# Patient Record
Sex: Female | Born: 1960 | Race: White | Hispanic: No | Marital: Married | Smoking: Former smoker
Health system: Southern US, Community
[De-identification: ages and names within clinical notes are randomized; demographics above are authoritative.]

## PROBLEM LIST (undated history)

## (undated) DIAGNOSIS — I4891 Unspecified atrial fibrillation: Secondary | ICD-10-CM

## (undated) DIAGNOSIS — Z86718 Personal history of other venous thrombosis and embolism: Secondary | ICD-10-CM

## (undated) DIAGNOSIS — I7779 Dissection of other artery: Secondary | ICD-10-CM

## (undated) DIAGNOSIS — I10 Essential (primary) hypertension: Secondary | ICD-10-CM

## (undated) DIAGNOSIS — E785 Hyperlipidemia, unspecified: Secondary | ICD-10-CM

## (undated) DIAGNOSIS — E119 Type 2 diabetes mellitus without complications: Secondary | ICD-10-CM

## (undated) HISTORY — PX: KNEE ARTHROSCOPY: SUR90

## (undated) HISTORY — DX: Essential (primary) hypertension: I10

## (undated) HISTORY — DX: Personal history of other venous thrombosis and embolism: Z86.718

## (undated) HISTORY — PX: ABDOMINAL HYSTERECTOMY: SHX81

## (undated) HISTORY — DX: Hyperlipidemia, unspecified: E78.5

## (undated) HISTORY — PX: FRACTURE SURGERY: SHX138

---

## 2008-04-09 DIAGNOSIS — Z86718 Personal history of other venous thrombosis and embolism: Secondary | ICD-10-CM

## 2008-04-09 HISTORY — DX: Personal history of other venous thrombosis and embolism: Z86.718

## 2009-01-01 ENCOUNTER — Emergency Department (HOSPITAL_COMMUNITY): Admission: EM | Admit: 2009-01-01 | Discharge: 2009-01-02 | Payer: Self-pay | Admitting: Emergency Medicine

## 2009-01-01 ENCOUNTER — Ambulatory Visit: Payer: Self-pay | Admitting: Vascular Surgery

## 2009-01-04 ENCOUNTER — Inpatient Hospital Stay (HOSPITAL_COMMUNITY): Admission: EM | Admit: 2009-01-04 | Discharge: 2009-01-11 | Payer: Self-pay | Admitting: Emergency Medicine

## 2009-02-10 ENCOUNTER — Ambulatory Visit: Payer: Self-pay | Admitting: Vascular Surgery

## 2009-05-05 ENCOUNTER — Ambulatory Visit: Payer: Self-pay | Admitting: Vascular Surgery

## 2009-05-05 ENCOUNTER — Encounter: Admission: RE | Admit: 2009-05-05 | Discharge: 2009-05-05 | Payer: Self-pay | Admitting: Vascular Surgery

## 2010-02-24 ENCOUNTER — Encounter: Admission: RE | Admit: 2010-02-24 | Payer: Self-pay | Admitting: Vascular Surgery

## 2010-04-26 ENCOUNTER — Ambulatory Visit: Admit: 2010-04-26 | Payer: Self-pay | Admitting: Vascular Surgery

## 2010-04-29 ENCOUNTER — Encounter: Payer: Self-pay | Admitting: Vascular Surgery

## 2010-04-30 ENCOUNTER — Encounter: Payer: Self-pay | Admitting: Vascular Surgery

## 2010-07-13 LAB — CBC
HCT: 36.6 % (ref 36.0–46.0)
Hemoglobin: 11.9 g/dL — ABNORMAL LOW (ref 12.0–15.0)
Hemoglobin: 12.7 g/dL (ref 12.0–15.0)
Hemoglobin: 13.2 g/dL (ref 12.0–15.0)
Hemoglobin: 13.8 g/dL (ref 12.0–15.0)
RBC: 4.54 MIL/uL (ref 3.87–5.11)
RBC: 4.72 MIL/uL (ref 3.87–5.11)
RBC: 4.89 MIL/uL (ref 3.87–5.11)
RDW: 13.5 % (ref 11.5–15.5)
RDW: 13.8 % (ref 11.5–15.5)
WBC: 6.3 10*3/uL (ref 4.0–10.5)
WBC: 9.8 10*3/uL (ref 4.0–10.5)

## 2010-07-13 LAB — HEPARIN LEVEL (UNFRACTIONATED): Heparin Unfractionated: <0.1 IU/mL — ABNORMAL LOW (ref 0.30–0.70)

## 2010-07-13 LAB — PROTIME-INR
INR: 1 (ref 0.00–1.49)
INR: 1 (ref 0.00–1.49)
INR: 1.2 (ref 0.00–1.49)
INR: 1.4 (ref 0.00–1.49)
Prothrombin Time: 12.9 seconds (ref 11.6–15.2)
Prothrombin Time: 14.9 seconds (ref 11.6–15.2)
Prothrombin Time: 17.3 seconds — ABNORMAL HIGH (ref 11.6–15.2)

## 2010-07-14 LAB — DIFFERENTIAL
Basophils Absolute: 0 10*3/uL (ref 0.0–0.1)
Basophils Relative: 0 % (ref 0–1)
Eosinophils Relative: 2 % (ref 0–5)
Eosinophils Relative: 3 % (ref 0–5)
Lymphocytes Relative: 22 % (ref 12–46)
Lymphocytes Relative: 34 % (ref 12–46)
Lymphs Abs: 2.7 10*3/uL (ref 0.7–4.0)
Monocytes Absolute: 0.4 10*3/uL (ref 0.1–1.0)
Monocytes Absolute: 0.4 10*3/uL (ref 0.1–1.0)
Monocytes Relative: 4 % (ref 3–12)

## 2010-07-14 LAB — CBC
MCHC: 32.5 g/dL (ref 30.0–36.0)
MCHC: 32.6 g/dL (ref 30.0–36.0)
MCHC: 33 g/dL (ref 30.0–36.0)
MCV: 84.6 fL (ref 78.0–100.0)
MCV: 85.6 fL (ref 78.0–100.0)
Platelets: 209 10*3/uL (ref 150–400)
Platelets: 241 10*3/uL (ref 150–400)
Platelets: 265 10*3/uL (ref 150–400)
Platelets: 268 10*3/uL (ref 150–400)
Platelets: 281 10*3/uL (ref 150–400)
RBC: 4.21 MIL/uL (ref 3.87–5.11)
RDW: 13.7 % (ref 11.5–15.5)
RDW: 13.7 % (ref 11.5–15.5)
WBC: 6.9 10*3/uL (ref 4.0–10.5)
WBC: 7 10*3/uL (ref 4.0–10.5)
WBC: 8 10*3/uL (ref 4.0–10.5)
WBC: 8.7 10*3/uL (ref 4.0–10.5)

## 2010-07-14 LAB — COMPREHENSIVE METABOLIC PANEL
AST: 24 U/L (ref 0–37)
AST: 33 U/L (ref 0–37)
Albumin: 3.7 g/dL (ref 3.5–5.2)
Albumin: 3.9 g/dL (ref 3.5–5.2)
Alkaline Phosphatase: 66 U/L (ref 39–117)
Calcium: 9 mg/dL (ref 8.4–10.5)
Chloride: 105 mEq/L (ref 96–112)
Chloride: 108 mEq/L (ref 96–112)
Creatinine, Ser: 0.83 mg/dL (ref 0.4–1.2)
GFR calc Af Amer: >60 mL/min (ref >60)
GFR calc Af Amer: >60 mL/min (ref >60)
Potassium: 3.2 mEq/L — ABNORMAL LOW (ref 3.5–5.1)
Total Bilirubin: 0.9 mg/dL (ref 0.3–1.2)
Total Protein: 7.3 g/dL (ref 6.0–8.3)

## 2010-07-14 LAB — URINALYSIS, ROUTINE W REFLEX MICROSCOPIC
Bilirubin Urine: NEGATIVE
Glucose, UA: NEGATIVE mg/dL
Hgb urine dipstick: NEGATIVE
Hgb urine dipstick: NEGATIVE
Specific Gravity, Urine: 1.014 (ref 1.005–1.030)
Specific Gravity, Urine: 1.021 (ref 1.005–1.030)
Urobilinogen, UA: 0.2 mg/dL (ref 0.0–1.0)
pH: 7.5 (ref 5.0–8.0)

## 2010-07-14 LAB — BASIC METABOLIC PANEL
BUN: 6 mg/dL (ref 6–23)
CO2: 31 mEq/L (ref 19–32)
Calcium: 8.3 mg/dL — ABNORMAL LOW (ref 8.4–10.5)
Chloride: 104 mEq/L (ref 96–112)
Creatinine, Ser: 0.76 mg/dL (ref 0.4–1.2)
Creatinine, Ser: 0.82 mg/dL (ref 0.4–1.2)
GFR calc Af Amer: >60 mL/min (ref >60)
GFR calc non Af Amer: >60 mL/min (ref >60)
Glucose, Bld: 123 mg/dL — ABNORMAL HIGH (ref 70–99)
Sodium: 135 mEq/L (ref 135–145)

## 2010-07-14 LAB — PROTIME-INR: Prothrombin Time: 13.2 seconds (ref 11.6–15.2)

## 2010-07-14 LAB — POCT I-STAT, CHEM 8
Calcium, Ion: 1.17 mmol/L (ref 1.12–1.32)
Chloride: 106 mEq/L (ref 96–112)
Creatinine, Ser: 0.6 mg/dL (ref 0.4–1.2)
Glucose, Bld: 120 mg/dL — ABNORMAL HIGH (ref 70–99)
HCT: 46 % (ref 36.0–46.0)

## 2010-07-14 LAB — HEPARIN LEVEL (UNFRACTIONATED)
Heparin Unfractionated: 0.64 IU/mL (ref 0.30–0.70)
Heparin Unfractionated: 0.68 IU/mL (ref 0.30–0.70)
Heparin Unfractionated: 0.85 IU/mL — ABNORMAL HIGH (ref 0.30–0.70)

## 2010-07-14 LAB — URINE MICROSCOPIC-ADD ON

## 2010-07-14 LAB — URINE CULTURE

## 2010-08-22 NOTE — Assessment & Plan Note (Signed)
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  This is a pleasant 50 year old woman who presented in  September of 2010 with a 3 week history of epigastric pain.  CT scan  suggested inflammation around the celiac axis and possibly some thrombus  within the proximal celiac axis.  The patient had injured her epigastric  area on a shopping cart at [facility] prior to this.  Because of her morbid  obesity we took a conservative approach and she had a followup CT scan  which showed that in fact there really was no thrombus in the proximal  celiac axis but there was a dissection and this was treated  conservatively.  She was placed on Coumadin with tight blood pressure  control.  She is followed by Dr. Alwyn [name].   Since I saw her last in November she has had no abdominal pain or back  pain.  She has had no postprandial abdominal pain.  She has had no  weight loss and in fact she has gained 5-6 pounds.  She has been  essentially asymptomatic since her last visit.   PAST MEDICAL HISTORY:  Significant for obesity which has been stable  with only some slight weight gain since I saw her in November.   SOCIAL HISTORY:  She is married.  She is not a smoker.   REVIEW OF SYSTEMS:  CARDIOVASCULAR:  She has had no recent chest pain,  chest pressure, palpitations or arrhythmias.  She has had no history of  claudication, rest pain or nonhealing ulcers.  She has had no history of  stroke, TIAs.  She has had no history of DVT or phlebitis.  PULMONARY:  She has had no productive cough, bronchitis, asthma or  wheezing.   PHYSICAL EXAMINATION:  General:  This is a pleasant 50 year old woman  who appears her stated age.  She is obese.  Vital signs:  Blood pressure  is 146/91, temperature is 98.2, heart rate is 84.  Lungs:   Clear  bilaterally to auscultation.  Cardiovascular:  I do not detect any  carotid bruits.  She has a regular rate and rhythm without murmur  appreciated.  She has palpable femoral and posterior tibial pulses  bilaterally with warm and well-perfused feet.  She has no significant  lower extremity swelling.  Abdomen:  Soft and nontender.  I cannot  appreciate any bruits.  She has normal pitched bowel sounds.  Neurological:  She has no focal weakness or paresthesias.   I did review her CT scan of the abdomen which was performed today.  It  is difficult to visualize the proximal celiac axis but beyond that the  artery is clearly patent with no evidence of aneurysm or complication  related to her previous dissection.  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  She has  been on Coumadin since September and I think at this point it is safe to  discontinue her Coumadin.  I think it would be a good idea to  keep her  on aspirin daily.  At this point I think it is safe to do a followup CT  scan in 1 year.  She knows to call sooner if she has problems.  I have  ordered the followup CT scan and I will see her back at that time.     Di Kindle. Edilia Bo, M.D.  Electronically Signed   CSD/MEDQ  D:  05/05/2009  T:  05/06/2009  Job:  [id number]   cc:   Tracie [name], M.D.

## 2010-08-22 NOTE — Assessment & Plan Note (Signed)
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  This is a pleasant 50 year old woman who had  presented initially September 25 with a 3 week history of epigastric  pain.  CT at that time suggested some inflammation around the celiac  artery and some thrombus within the proximal celiac axis.  Of note this  patient was hit with a shopping cart in the epigastric area at [facility]  prior to this.  She had morbid obesity and we took a conservative  approach.  She was set up for a followup CT scan which actually showed  that there was really no thrombus in the celiac artery but this was a  dissection.  We have elected to treat this conservatively with Coumadin  and blood pressure control.  She has been on Coumadin and is followed by  Dr. Alwyn Pea.   Since she was in the hospital she has had no further abdominal pain.  Her appetite has been good.  She has had no postprandial pain.  Her  bowels have been functioning normally.   REVIEW OF SYSTEMS:  She has had no fever or chills.  She has had no  chest pain or chest pressure.   PHYSICAL EXAMINATION:  General:  This is a pleasant 50 year old woman  who appears her stated age.  She is 297 pounds.  Vital signs:  Her blood  pressure is 176/100, heart rate is 80, respiratory rate 16.  Abdomen:  Soft and nontender.  She has normal pitched bowel sounds.  Lungs:  Are  clear bilaterally to auscultation.   Overall her celiac dissection appears to be asymptomatic at this point  and we will continue her Coumadin for 3 months before repeating her CT  scan.  We will repeat a CT scan in 3 months and if it looks good at that  point I think it would be safe to stop her Coumadin.  In the meantime  she will continue to follow her Coumadin closely in Dr. Ermalene Searing  office.  I plan on seeing her back in early January after a CT scan.  She knows  to call sooner if she has problems.  We have also stressed the  importance of good blood pressure control with her.   Di Kindle. Edilia Bo, M.D.  Electronically Signed   CSD/MEDQ  D:  02/10/2009  T:  02/11/2009  Job:  [id number]

## 2012-06-06 ENCOUNTER — Other Ambulatory Visit: Payer: Self-pay | Admitting: Family Medicine

## 2012-06-06 DIAGNOSIS — Z1231 Encounter for screening mammogram for malignant neoplasm of breast: Secondary | ICD-10-CM

## 2012-06-30 ENCOUNTER — Ambulatory Visit
Admission: RE | Admit: 2012-06-30 | Discharge: 2012-06-30 | Disposition: A | Discharge status: Home / Self Care | Payer: 59 | Source: Ambulatory Visit | Attending: Family Medicine | Admitting: Family Medicine

## 2012-06-30 DIAGNOSIS — Z1231 Encounter for screening mammogram for malignant neoplasm of breast: Secondary | ICD-10-CM

## 2014-02-18 ENCOUNTER — Encounter (HOSPITAL_COMMUNITY): Payer: Self-pay | Admitting: *Deleted

## 2014-02-18 NOTE — Progress Notes (Signed)
Prior Auth Pristiq 100mg  Approved Effective dates of authorization: 02-18-14 to 04-08-2038 Phone number [phone]

## 2018-06-03 DIAGNOSIS — E785 Hyperlipidemia, unspecified: Secondary | ICD-10-CM | POA: Insufficient documentation

## 2018-06-03 DIAGNOSIS — I10 Essential (primary) hypertension: Secondary | ICD-10-CM | POA: Insufficient documentation

## 2018-07-09 ENCOUNTER — Other Ambulatory Visit: Payer: Self-pay

## 2018-07-09 DIAGNOSIS — Z1389 Encounter for screening for other disorder: Secondary | ICD-10-CM

## 2018-07-09 DIAGNOSIS — Z13220 Encounter for screening for lipoid disorders: Secondary | ICD-10-CM

## 2018-07-09 DIAGNOSIS — Z13228 Encounter for screening for other metabolic disorders: Secondary | ICD-10-CM

## 2018-07-09 DIAGNOSIS — Z1329 Encounter for screening for other suspected endocrine disorder: Secondary | ICD-10-CM

## 2018-07-09 DIAGNOSIS — Z130 Encounter for screening for diseases of the blood and blood-forming organs and certain disorders involving the immune mechanism: Secondary | ICD-10-CM

## 2018-07-15 ENCOUNTER — Ambulatory Visit (INDEPENDENT_AMBULATORY_CARE_PROVIDER_SITE_OTHER): Payer: Self-pay | Admitting: Family Medicine

## 2018-07-15 DIAGNOSIS — Z13220 Encounter for screening for lipoid disorders: Secondary | ICD-10-CM

## 2018-07-15 DIAGNOSIS — Z1329 Encounter for screening for other suspected endocrine disorder: Secondary | ICD-10-CM

## 2018-07-15 DIAGNOSIS — Z13228 Encounter for screening for other metabolic disorders: Secondary | ICD-10-CM

## 2018-07-15 DIAGNOSIS — Z1389 Encounter for screening for other disorder: Secondary | ICD-10-CM

## 2018-07-15 DIAGNOSIS — Z130 Encounter for screening for diseases of the blood and blood-forming organs and certain disorders involving the immune mechanism: Secondary | ICD-10-CM

## 2018-07-16 ENCOUNTER — Telehealth: Payer: Self-pay | Admitting: Family Medicine

## 2018-07-16 ENCOUNTER — Other Ambulatory Visit: Payer: Self-pay

## 2018-07-16 ENCOUNTER — Encounter: Payer: Self-pay | Admitting: Family Medicine

## 2018-07-16 ENCOUNTER — Ambulatory Visit (INDEPENDENT_AMBULATORY_CARE_PROVIDER_SITE_OTHER): Payer: 59 | Admitting: Family Medicine

## 2018-07-16 DIAGNOSIS — I10 Essential (primary) hypertension: Secondary | ICD-10-CM

## 2018-07-16 DIAGNOSIS — Z13220 Encounter for screening for lipoid disorders: Secondary | ICD-10-CM

## 2018-07-16 DIAGNOSIS — I83813 Varicose veins of bilateral lower extremities with pain: Secondary | ICD-10-CM

## 2018-07-16 DIAGNOSIS — Z1389 Encounter for screening for other disorder: Secondary | ICD-10-CM

## 2018-07-16 DIAGNOSIS — Z1329 Encounter for screening for other suspected endocrine disorder: Secondary | ICD-10-CM

## 2018-07-16 DIAGNOSIS — Z13228 Encounter for screening for other metabolic disorders: Secondary | ICD-10-CM

## 2018-07-16 DIAGNOSIS — Z130 Encounter for screening for diseases of the blood and blood-forming organs and certain disorders involving the immune mechanism: Secondary | ICD-10-CM

## 2018-07-16 LAB — CMP14+EGFR
ALT: 13 IU/L (ref 0–32)
AST: 14 IU/L (ref 0–40)
Albumin/Globulin Ratio: 1.3 (ref 1.2–2.2)
Albumin: 4 g/dL (ref 3.8–4.9)
Alkaline Phosphatase: 82 IU/L (ref 39–117)
BUN/Creatinine Ratio: 21 (ref 9–23)
BUN: 18 mg/dL (ref 6–24)
Bilirubin Total: 0.3 mg/dL (ref 0.0–1.2)
CO2: 23 mmol/L (ref 20–29)
Calcium: 9.5 mg/dL (ref 8.7–10.2)
Chloride: 103 mmol/L (ref 96–106)
Creatinine, Ser: 0.85 mg/dL (ref 0.57–1.00)
GFR calc Af Amer: 87 mL/min/{1.73_m2} (ref >59)
GFR calc non Af Amer: 75 mL/min/{1.73_m2} (ref >59)
Globulin, Total: 3 g/dL (ref 1.5–4.5)
Glucose: 146 mg/dL — ABNORMAL HIGH (ref 65–99)
Potassium: 4.2 mmol/L (ref 3.5–5.2)
Sodium: 142 mmol/L (ref 134–144)
Total Protein: 7 g/dL (ref 6.0–8.5)

## 2018-07-16 LAB — CBC WITH DIFFERENTIAL/PLATELET
Basophils Absolute: 0 10*3/uL (ref 0.0–0.2)
Basos: 0 %
EOS (ABSOLUTE): 0.5 10*3/uL — ABNORMAL HIGH (ref 0.0–0.4)
Eos: 4 %
Hematocrit: 43.1 % (ref 34.0–46.6)
Hemoglobin: 14.5 g/dL (ref 11.1–15.9)
Immature Grans (Abs): 0 10*3/uL (ref 0.0–0.1)
Immature Granulocytes: 0 %
Lymphocytes Absolute: 3.8 10*3/uL — ABNORMAL HIGH (ref 0.7–3.1)
Lymphs: 32 %
MCH: 29.1 pg (ref 26.6–33.0)
MCHC: 33.6 g/dL (ref 31.5–35.7)
MCV: 86 fL (ref 79–97)
Monocytes Absolute: 0.8 10*3/uL (ref 0.1–0.9)
Monocytes: 6 %
Neutrophils Absolute: 7 10*3/uL (ref 1.4–7.0)
Neutrophils: 58 %
Platelets: 295 10*3/uL (ref 150–450)
RBC: 4.99 x10E6/uL (ref 3.77–5.28)
RDW: 12.5 % (ref 11.7–15.4)
WBC: 12.1 10*3/uL — ABNORMAL HIGH (ref 3.4–10.8)

## 2018-07-16 LAB — URINALYSIS, DIPSTICK ONLY
Bilirubin, UA: NEGATIVE
Glucose, UA: NEGATIVE
Ketones, UA: NEGATIVE
Nitrite, UA: NEGATIVE
Protein,UA: NEGATIVE
RBC, UA: NEGATIVE
Specific Gravity, UA: 1.024 (ref 1.005–1.030)
Urobilinogen, Ur: 0.2 mg/dL (ref 0.2–1.0)
pH, UA: 5 (ref 5.0–7.5)

## 2018-07-16 MED ORDER — METOPROLOL SUCCINATE ER 50 MG PO TB24: 50.0000 mg | ORAL_TABLET | Freq: Every day | ORAL | 1 refills | Status: DC

## 2018-07-16 MED ORDER — LOSARTAN POTASSIUM 100 MG PO TABS: 100.0000 mg | ORAL_TABLET | Freq: Every day | ORAL | 1 refills | Status: DC

## 2018-07-16 MED ORDER — AMLODIPINE BESYLATE 10 MG PO TABS: 10.0000 mg | ORAL_TABLET | Freq: Every day | ORAL | 1 refills | Status: DC

## 2018-07-16 NOTE — Progress Notes (Signed)
Virtual Visit via telephone Note  I connected with patient on 07/16/18 at 220pm by telephone and verified that I am speaking with the correct person using two identifiers. Tracie [name] is currently located at home and patient is currently with her during visit. The provider, Myles Lipps, MD is located in their office at time of visit.  I discussed the limitations, risks, security and privacy concerns of performing an evaluation and management service by telephone and the availability of in person appointments. I also discussed with the patient that there may be a patient responsible charge related to this service. The patient expressed understanding and agreed to proceed.  No chief complaint on file.   Telephone visit today to establish care  HPI Previous PCP Dr Haskel Schroeder Patient reports she is overall doing well Checks BP occ at the pharmacy, 120/70 occ feels a bit off balance when she tries to stand to quick, no falls No chest pain, SOB, palpitations, no cough Reports sign swelling of legs, known varicose veins, better with elevation, does not use compression stockings, swelling prior to use of amlodipine?  No flowsheet data found.   No flowsheet data found.  Allergies  Allergen Reactions  . Codeine     Prior to Admission medications   Medication Sig Start Date End Date Taking? Authorizing Provider  amLODipine (NORVASC) 10 MG tablet TK 1 T PO QD 06/12/18  Yes [provider]  losartan (COZAAR) 100 MG tablet TK 1 T PO QD 06/12/18   [provider]  metoprolol succinate (TOPROL-XL) 50 MG 24 hr tablet TK 1 T PO QD 06/12/18   [provider]    Past Medical History:  Diagnosis Date  . Hyperlipidemia   . Hypertension     History reviewed. No pertinent surgical history.  Social History   Tobacco Use  . Smoking status: Former Smoker    Packs/day: 1.00    Years: 20.00    Pack years: 20.00    Types: Cigarettes    Last attempt to quit: 2010     Years since quitting: 10.2  . Smokeless tobacco: Never Used  Substance Use Topics  . Alcohol use: Never    Frequency: Never    History reviewed. No pertinent family history.  ROS Per hpi   Objective  Vitals as reported by the patient: as above  There were no vitals filed for this visit.  ASSESSMENT and PLAN  1. Essential hypertension, benign Per reported Bps seems to be at goal. meds refilled  2. Varicose veins of both lower extremities with pain Discussed use of compression stockings  Other orders - amLODipine (NORVASC) 10 MG tablet; Take 1 tablet (10 mg total) by mouth daily. - losartan (COZAAR) 100 MG tablet; Take 1 tablet (100 mg total) by mouth daily. - metoprolol succinate (TOPROL-XL) 50 MG 24 hr tablet; Take 1 tablet (50 mg total) by mouth daily. Take with or immediately following a meal.  FOLLOW-UP: 3 months   The above assessment and management plan was discussed with the patient. The patient verbalized understanding of and has agreed to the management plan. Patient is aware to call the clinic if symptoms persist or worsen. Patient is aware when to return to the clinic for a follow-up visit. Patient educated on when it is appropriate to go to the emergency department.    I provided 15 minutes of non-face-to-face time during this encounter.  Myles Lipps, MD Primary Care at [facility] [address] [place], [place]  [address] Ph.  [phone] Fax [phone]

## 2018-07-16 NOTE — Telephone Encounter (Signed)
Left VM to schedule appt for 3 month f/u per [place]

## 2018-07-17 LAB — LIPID PANEL
Chol/HDL Ratio: 3.2 ratio (ref 0.0–4.4)
Cholesterol, Total: 161 mg/dL (ref 100–199)
HDL: 51 mg/dL (ref >39)
LDL Calculated: 85 mg/dL (ref 0–99)
Triglycerides: 125 mg/dL (ref 0–149)
VLDL Cholesterol Cal: 25 mg/dL (ref 5–40)

## 2018-07-17 LAB — TSH: TSH: 3.67 u[IU]/mL (ref 0.450–4.500)

## 2018-10-20 ENCOUNTER — Ambulatory Visit (INDEPENDENT_AMBULATORY_CARE_PROVIDER_SITE_OTHER): Payer: 59 | Admitting: Family Medicine

## 2018-10-20 ENCOUNTER — Encounter: Payer: Self-pay | Admitting: Family Medicine

## 2018-10-20 ENCOUNTER — Other Ambulatory Visit: Payer: Self-pay

## 2018-10-20 VITALS — BP 126/84 | HR 100 | Temp 97.7°F | Ht 70.5 in | Wt 317.0 lb

## 2018-10-20 DIAGNOSIS — R29898 Other symptoms and signs involving the musculoskeletal system: Secondary | ICD-10-CM

## 2018-10-20 DIAGNOSIS — M791 Myalgia, unspecified site: Secondary | ICD-10-CM

## 2018-10-20 DIAGNOSIS — D72829 Elevated white blood cell count, unspecified: Secondary | ICD-10-CM

## 2018-10-20 DIAGNOSIS — Z1231 Encounter for screening mammogram for malignant neoplasm of breast: Secondary | ICD-10-CM

## 2018-10-20 DIAGNOSIS — M2550 Pain in unspecified joint: Secondary | ICD-10-CM

## 2018-10-20 DIAGNOSIS — I10 Essential (primary) hypertension: Secondary | ICD-10-CM

## 2018-10-20 DIAGNOSIS — R768 Other specified abnormal immunological findings in serum: Secondary | ICD-10-CM

## 2018-10-20 DIAGNOSIS — R700 Elevated erythrocyte sedimentation rate: Secondary | ICD-10-CM

## 2018-10-20 DIAGNOSIS — R7301 Impaired fasting glucose: Secondary | ICD-10-CM

## 2018-10-20 DIAGNOSIS — Z1211 Encounter for screening for malignant neoplasm of colon: Secondary | ICD-10-CM

## 2018-10-20 LAB — CBC WITH DIFFERENTIAL/PLATELET
Basophils Absolute: 0.1 10*3/uL (ref 0.0–0.2)
Basos: 1 %
EOS (ABSOLUTE): 0.4 10*3/uL (ref 0.0–0.4)
Eos: 4 %
Hematocrit: 46.2 % (ref 34.0–46.6)
Hemoglobin: 15.2 g/dL (ref 11.1–15.9)
Immature Grans (Abs): 0 10*3/uL (ref 0.0–0.1)
Immature Granulocytes: 0 %
Lymphocytes Absolute: 2.5 10*3/uL (ref 0.7–3.1)
Lymphs: 27 %
MCH: 28.2 pg (ref 26.6–33.0)
MCHC: 32.9 g/dL (ref 31.5–35.7)
MCV: 86 fL (ref 79–97)
Monocytes Absolute: 0.5 10*3/uL (ref 0.1–0.9)
Monocytes: 6 %
Neutrophils Absolute: 5.8 10*3/uL (ref 1.4–7.0)
Neutrophils: 62 %
Platelets: 301 10*3/uL (ref 150–450)
RBC: 5.39 x10E6/uL — ABNORMAL HIGH (ref 3.77–5.28)
RDW: 12 % (ref 11.7–15.4)
WBC: 9.2 10*3/uL (ref 3.4–10.8)

## 2018-10-20 LAB — HEMOGLOBIN A1C
Est. average glucose Bld gHb Est-mCnc: 148 mg/dL
Hgb A1c MFr Bld: 6.8 % — ABNORMAL HIGH (ref 4.8–5.6)

## 2018-10-20 MED ORDER — METOPROLOL SUCCINATE ER 50 MG PO TB24: 50.0000 mg | ORAL_TABLET | Freq: Every day | ORAL | 1 refills | Status: DC

## 2018-10-20 MED ORDER — CYCLOBENZAPRINE HCL 10 MG PO TABS: 10.0000 mg | ORAL_TABLET | Freq: Three times a day (TID) | ORAL | 1 refills | Status: DC | PRN

## 2018-10-20 MED ORDER — LOSARTAN POTASSIUM 100 MG PO TABS: 100.0000 mg | ORAL_TABLET | Freq: Every day | ORAL | 1 refills | Status: DC

## 2018-10-20 MED ORDER — AMLODIPINE BESYLATE 10 MG PO TABS: 10.0000 mg | ORAL_TABLET | Freq: Every day | ORAL | 1 refills | Status: DC

## 2018-10-20 NOTE — Progress Notes (Signed)
7/13/202010:49 AM  Mordecai MaesDebbie E Stormont [birth year], 58 y.o., female [id number]  Chief Complaint  Patient presents with  . Hypertension    has concerns about the medication she is taking due to the side effects. She is having trouble walking now due to the side effects    HPI:   Patient is a 58 y.o. female with past medical history significant for HTN and HLP who presents today for BP followup  She is concerned she might be having side effects  Has been on current BP medications since 2012 She reports current symptoms for years but have worsened this past year Feels leg are very weak, difficulty with standing due to feeling weak, muscles are painful, knees get stiff Reporting constant moving of legs at night Denies any numbness or tingling of legs but has lots of cramping when she sits on the toilet She reports chronic right sided low back pain Pain is worse when she wakes up No fhx inflammatory arthritis  Right knee arthroscopy surgery, has had multiple strained Right quad   Lab Results  Component Value Date   CHOL 161 07/16/2018   HDL 51 07/16/2018   LDLCALC 85 07/16/2018   TRIG 125 07/16/2018   CHOLHDL 3.2 07/16/2018   Lab Results  Component Value Date   CREATININE 0.85 07/16/2018   BUN 18 07/16/2018   NA 142 07/16/2018   K 4.2 07/16/2018   CL 103 07/16/2018   CO2 23 07/16/2018   Lab Results  Component Value Date   WBC 12.1 (H) 07/16/2018   HGB 14.5 07/16/2018   HCT 43.1 07/16/2018   MCV 86 07/16/2018   PLT 295 07/16/2018   Fasting glucose 145  [facility] 10/20/2018 10/20/2018  Decreased Interest 0 -  Down, Depressed, Hopeless 0 0  PHQ - 2 Score 0 0    Fall Risk  10/20/2018  Falls in the past year? 0  Number falls in past yr: 0  Injury with Fall? 0     Allergies  Allergen Reactions  . Codeine     Prior to Admission medications   Medication Sig Start Date End Date Taking? Authorizing Provider  amLODipine (NORVASC) 10 MG tablet Take 1  tablet (10 mg total) by mouth daily. 07/16/18  Yes Myles LippsSantiago, Shanequia Kendrick M, MD  losartan (COZAAR) 100 MG tablet Take 1 tablet (100 mg total) by mouth daily. 07/16/18  Yes Myles LippsSantiago, Nyaira Hodgens M, MD  metoprolol succinate (TOPROL-XL) 50 MG 24 hr tablet Take 1 tablet (50 mg total) by mouth daily. Take with or immediately following a meal. 07/16/18  Yes Myles LippsSantiago, Mitsuko Luera M, MD    Past Medical History:  Diagnosis Date  . History of DVT (deep vein thrombosis) 2010  . Hyperlipidemia   . Hypertension     Past Surgical History:  Procedure Laterality Date  . FRACTURE SURGERY Left    hand  . KNEE ARTHROSCOPY Right 1990s    Social History   Tobacco Use  . Smoking status: Former Smoker    Packs/day: 1.00    Years: 20.00    Pack years: 20.00    Types: Cigarettes    Quit date: 2010    Years since quitting: 10.5  . Smokeless tobacco: Never Used  Substance Use Topics  . Alcohol use: Never    Frequency: Never    Family History  Problem Relation Age of Onset  . Heart attack Father     ROS   OBJECTIVE:  Today's Vitals   10/20/18 1040  BP: 126/84  Pulse: 100  Temp: 97.7 F (36.5 C)  TempSrc: Oral  SpO2: 95%  Weight: (!) 317 lb (143.8 kg)  Height: 5' 10.5" (1.791 m)   Body mass index is 44.84 kg/m.    Physical Exam Vitals signs and nursing note reviewed.  Constitutional:      Appearance: She is well-developed.  HENT:     Head: Normocephalic and atraumatic.     Mouth/Throat:     Pharynx: No oropharyngeal exudate.  Eyes:     General: No scleral icterus.    Conjunctiva/sclera: Conjunctivae normal.     Pupils: Pupils are equal, round, and reactive to light.  Neck:     Musculoskeletal: Neck supple.  Cardiovascular:     Rate and Rhythm: Normal rate and regular rhythm.     Heart sounds: Normal heart sounds. No murmur. No friction rub. No gallop.   Pulmonary:     Effort: Pulmonary effort is normal.     Breath sounds: Normal breath sounds. No wheezing or rales.  Musculoskeletal:      Cervical back: Normal.     Thoracic back: She exhibits tenderness (midline). She exhibits no spasm.     Lumbar back: She exhibits tenderness (midline and right side). She exhibits no spasm.     Comments: Major joints wo swelling, erythema or warmth  Skin:    General: Skin is warm and dry.  Neurological:     Mental Status: She is alert and oriented to person, place, and time.     Motor: Weakness (right hip flexors, other BLE muscles normal strength) present.     Gait: Gait abnormal.     Deep Tendon Reflexes: Reflexes are normal and symmetric.      ASSESSMENT and PLAN  1. Essential hypertension, benign Controlled. Continue current regime. Discussed symptoms of concern, mainly RLE weakness, not side effect of medication.   2. Leukocytosis, unspecified type - CBC with Differential/Platelet  3. Elevated fasting glucose - Hemoglobin A1c  4. Visit for screening mammogram - MM Digital Screening; Future  5. Colon cancer screening - Cologuard  6. Myalgia - CK  7. Arthralgia, unspecified joint - Sedimentation Rate - C-reactive protein - ANA w/Reflex if Positive - Rheumatoid factor  8. Right leg weakness - Ambulatory referral to Sports Medicine  Other orders - cyclobenzaprine (FLEXERIL) 10 MG tablet; Take 1 tablet (10 mg total) by mouth 3 (three) times daily as needed for muscle spasms. - metoprolol succinate (TOPROL-XL) 50 MG 24 hr tablet; Take 1 tablet (50 mg total) by mouth daily. Take with or immediately following a meal. - losartan (COZAAR) 100 MG tablet; Take 1 tablet (100 mg total) by mouth daily. - amLODipine (NORVASC) 10 MG tablet; Take 1 tablet (10 mg total) by mouth daily.  Return in about 3 months (around 01/20/2019).    Rutherford Guys, MD Primary Care at [facility] [place], [place] [address] Ph.  [phone] Fax [phone]

## 2018-10-20 NOTE — Patient Instructions (Signed)
° ° ° °  If you have lab work done today you will be contacted with your lab results within the next 2 weeks.  If you have not heard from us then please contact us. The fastest way to get your results is to register for My Chart. ° ° °IF you received an x-ray today, you will receive an invoice from [facility]. Please contact [facility] at [phone] with questions or concerns regarding your invoice.  ° °IF you received labwork today, you will receive an invoice from [facility]. Please contact [facility] at [phone] with questions or concerns regarding your invoice.  ° °Our billing staff will not be able to assist you with questions regarding bills from these companies. ° °You will be contacted with the lab results as soon as they are available. The fastest way to get your results is to activate your My Chart account. Instructions are located on the last page of this paperwork. If you have not heard from us regarding the results in 2 weeks, please contact this office. °  ° ° ° °

## 2018-10-21 LAB — ANA W/REFLEX IF POSITIVE
Anti JO-1: <0.2 AI (ref 0.0–0.9)
Anti Nuclear Antibody (ANA): POSITIVE — AB
Centromere Ab Screen: <0.2 AI (ref 0.0–0.9)
Chromatin Ab SerPl-aCnc: <0.2 AI (ref 0.0–0.9)
ENA RNP Ab: <0.2 AI (ref 0.0–0.9)
ENA SM Ab Ser-aCnc: <0.2 AI (ref 0.0–0.9)
ENA SSA (RO) Ab: <0.2 AI (ref 0.0–0.9)
ENA SSB (LA) Ab: <0.2 AI (ref 0.0–0.9)
Scleroderma (Scl-70) (ENA) Antibody, IgG: <0.2 AI (ref 0.0–0.9)
dsDNA Ab: 14 IU/mL — ABNORMAL HIGH (ref 0–9)

## 2018-10-21 LAB — C-REACTIVE PROTEIN: CRP: 3 mg/L (ref 0–10)

## 2018-10-21 LAB — RHEUMATOID FACTOR: Rheumatoid fact SerPl-aCnc: <10 IU/mL (ref 0.0–13.9)

## 2018-10-21 LAB — CK: Total CK: 29 U/L — ABNORMAL LOW (ref 32–182)

## 2018-10-21 LAB — SEDIMENTATION RATE: Sed Rate: 65 mm/hr — ABNORMAL HIGH (ref 0–40)

## 2018-10-27 ENCOUNTER — Encounter: Payer: Self-pay | Admitting: Family Medicine

## 2018-10-27 ENCOUNTER — Other Ambulatory Visit: Payer: Self-pay

## 2018-10-27 ENCOUNTER — Ambulatory Visit: Payer: 59 | Admitting: Family Medicine

## 2018-10-27 DIAGNOSIS — M25561 Pain in right knee: Secondary | ICD-10-CM

## 2018-10-27 DIAGNOSIS — M791 Myalgia, unspecified site: Secondary | ICD-10-CM

## 2018-10-27 NOTE — Addendum Note (Signed)
Addended by: Rutherford Guys on: 10/27/2018 01:25 PM   Modules accepted: Orders

## 2018-10-27 NOTE — Assessment & Plan Note (Addendum)
Myalgia of right thigh. Improved with Flexeril. 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 Do not think further imaging is warranted at this time. Recommended continuing Flexeril and follow up with rheumatologist for further work up and evaluation. Patient agreed to this plan.  Can consider quad strengthening exercises in further.

## 2018-10-27 NOTE — Progress Notes (Signed)
   PCP: Rutherford Guys, MD  Subjective:   HPI: Patient is a 58 y.o. female here for right LE weakness x years that has progressively worsened recently. She notes the thigh muscle would be very tender to touch and weak. Saw PCP on 7/13 and was given Flexeril that has resolved her pain in her leg. She also notes pain primarily when she walks a lot that leads to instability and weakness. She also notes she has pain in her right knee x years. She had arthroscopy to clear the joint. She twisted it a weird way in 2012 that caused a lot of pain. She did not get this treated. She has had some intermittent knee weakness since then. No popping/clicking. No numbness/tingling. Denies any back pain, hip pain, or ankle pain.  Of note, was noted to have elevated inflammatory markers including ESR with positive ANA and dsDNA Ab. Her CK and RF level was WNL. PCP placed referral to rheumatologist.   Review of Systems:  Per HPI.   [place], medications and smoking status reviewed.      Objective:  Physical Exam:  Gen: awake, alert, NAD, comfortable in exam room Pulm: breathing unlabored  Knee, right: - Inspection: no gross deformity. No swelling/effusion, erythema or bruising. Skin intact. Genu valgum bilaterally. - Palpation: no TTP - ROM: full active ROM with flexion and extension in knee and hip - Strength: 5/5 strength - Neuro/vasc: NV intact - Special Tests: - LIGAMENTS: negative anterior and posterior drawer, negative Lachman's, no MCL or LCL laxity  -- MENISCUS: negative McMurray's, negative Thessaly  -- PF JOINT: nml patellar mobility bilaterally.    Knee, Left: - Inspection: no gross deformity. No swelling/effusion, erythema or bruising. Skin intact. Genu valgum bilaterally. - Palpation: no TTP - ROM: full active ROM with flexion and extension in knee and hip - Strength: 5/5 strength - Neuro/vasc: NV intact - Special Tests: - LIGAMENTS: negative anterior and posterior drawer, negative  Lachman's, no MCL or LCL laxity  -- MENISCUS: negative McMurray's, negative Thessaly  -- PF JOINT: nml patellar mobility bilaterally  Feet Bilaterally: pes planus bilaterally  Hips: normal ROM, negative FABER and FADIR bilaterally    Assessment & Plan:   Myalgia Myalgia of right thigh. Improved with Flexeril. 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 Do not think further imaging is warranted at this time. Recommended continuing Flexeril and follow up with rheumatologist for further work up and evaluation. Patient agreed to this plan.  Can consider quad strengthening exercises in further.  Arthralgia of knee, right Complains more of weakness rather than pain. Physical exam unremarkable. Do not think imaging is warranted at thsi time. Baseline pes planus and genu valgum may be contributing to stability of LE's while ambulating. Will refer further work up to rheumatologist at this time. If not improved or worsens can consider further imaging, orthotics, and/or physical therapy.   Mina Marble, [place], PGY2 10/27/2018 4:42 PM

## 2018-10-27 NOTE — Assessment & Plan Note (Signed)
Complains more of weakness rather than pain. Physical exam unremarkable. Do not think imaging is warranted at thsi time. Baseline pes planus and genu valgum may be contributing to stability of LE's while ambulating. Will refer further work up to rheumatologist at this time. If not improved or worsens can consider further imaging, orthotics, and/or physical therapy.

## 2018-10-29 ENCOUNTER — Encounter: Payer: Self-pay | Admitting: Family Medicine

## 2018-11-03 LAB — COLOGUARD: Cologuard: NEGATIVE

## 2018-11-06 ENCOUNTER — Encounter: Payer: Self-pay | Admitting: Radiology

## 2018-11-19 NOTE — Progress Notes (Signed)
Office Visit Note  Patient: Tracie [name]             Date of Birth: Aug 08, 1960           MRN: [id number]             PCP: Myles LippsSantiago, Irma M, MD Referring: Myles LippsSantiago, Irma M, MD Visit Date: 12/03/2018 Occupation: Disability  Subjective:  Pain in multiple joints and muscles.   History of Present Illness: Tracie [name] is a 58 y.o. female seen in consultation per request of Dr. Leretha PolSantiago.  According to patient she has had history of joint and muscle pain for the last 6 years.  She has had right knee joint meniscal tear repair surgery in the 90s.  She continues to have some discomfort in her right knee.  She also had left wrist injury at her work.  Because of that she is on disability.  She states the pain has been progressively getting worse.  She has been having increased pain over time in her hands and her knees.  She also reports some discomfort in her shoulders.  She denies any joint swelling.  She states she gets some pedal edema towards the end of the day.  She also complains of some muscle spasms in her lower extremities.  She has noticed improvement in her symptoms with Flexeril.  There is no  family history of autoimmune disease.  Activities of Daily Living:  Patient reports morning stiffness for several hours.   Patient Reports nocturnal pain.  Difficulty dressing/grooming: Denies Difficulty climbing stairs: Reports Difficulty getting out of chair: Reports Difficulty using hands for taps, buttons, cutlery, and/or writing: Denies  Review of Systems  Constitutional: Positive for fatigue. Negative for night sweats, weight gain and weight loss.  HENT: Positive for mouth dryness. Negative for mouth sores, trouble swallowing, trouble swallowing and nose dryness.   Eyes: Negative for pain, redness, itching, visual disturbance and dryness.  Respiratory: Negative for cough, shortness of breath, wheezing and difficulty breathing.   Cardiovascular: Negative for chest pain, palpitations,  hypertension, irregular heartbeat and swelling in legs/feet.  Gastrointestinal: Positive for constipation. Negative for blood in stool and diarrhea.  Endocrine: Negative for increased urination.  Genitourinary: Negative for difficulty urinating, painful urination and vaginal dryness.  Musculoskeletal: Positive for arthralgias, joint pain and morning stiffness. Negative for joint swelling, myalgias, muscle weakness, muscle tenderness and myalgias.  Skin: Negative for color change, rash, hair loss, redness, skin tightness, ulcers and sensitivity to sunlight.  Allergic/Immunologic: Negative for susceptible to infections.  Neurological: Negative for dizziness, light-headedness, headaches, memory loss, night sweats and weakness.  Hematological: Negative for bruising/bleeding tendency and swollen glands.  Psychiatric/Behavioral: Negative for depressed mood, confusion and sleep disturbance. The patient is not nervous/anxious.     PMFS History:  Patient Active Problem List   Diagnosis Date Noted  . Myalgia 10/27/2018  . Arthralgia of knee, right 10/27/2018  . Varicose veins of both lower extremities with pain 07/16/2018  . Hyperlipidemia 06/03/2018  . Essential hypertension, benign 06/03/2018    Past Medical History:  Diagnosis Date  . History of DVT (deep vein thrombosis) 2010  . Hyperlipidemia   . Hypertension     Family History  Problem Relation Age of Onset  . Hypertension Mother   . Heart attack Father   . Heart disease Brother   . Healthy Son    Past Surgical History:  Procedure Laterality Date  . ABDOMINAL HYSTERECTOMY    . FRACTURE SURGERY Left  hand  . KNEE ARTHROSCOPY Right 1990s   Social History   Social History Narrative  . Not on file    There is no immunization history on file for this patient.   Objective: Vital Signs: BP 127/84 (BP Location: Left Arm, Patient Position: Sitting, Cuff Size: Large)   Pulse 74   Resp 15   Ht 5' 10.5" (1.791 m)   Wt (!) 316  lb 9.6 oz (143.6 kg)   BMI 44.79 kg/m    Physical Exam Vitals signs and nursing note reviewed.  Constitutional:      Appearance: She is well-developed.  HENT:     Head: Normocephalic and atraumatic.  Eyes:     Conjunctiva/sclera: Conjunctivae normal.  Neck:     Musculoskeletal: Normal range of motion.  Cardiovascular:     Rate and Rhythm: Normal rate and regular rhythm.     Heart sounds: Normal heart sounds.  Pulmonary:     Effort: Pulmonary effort is normal.     Breath sounds: Normal breath sounds.  Abdominal:     General: Bowel sounds are normal.     Palpations: Abdomen is soft.  Lymphadenopathy:     Cervical: No cervical adenopathy.  Skin:    General: Skin is warm and dry.     Capillary Refill: Capillary refill takes less than 2 seconds.  Neurological:     Mental Status: She is alert and oriented to person, place, and time.  Psychiatric:        Behavior: Behavior normal.      Musculoskeletal Exam: C-spine was in good range of motion.  She has discomfort on range of motion of her lumbar spine.  Shoulder joints elbow joints wrist joints with good range of motion.  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  She had injury to her right fifth digit which has left her with the contracture of the fifth PIP joint.  None of the other MCPs PIPs or DIPs showed any deformities or changes.  Hip joints had limited range of motion because of lower back discomfort.  Knee joints are in good range of motion with discomfort.  Ankle joints MTPs PIPs with good range of motion.  She had mild pedal edema.  CDAI Exam: CDAI Score: - Patient Global: -; Provider Global: - Swollen: -; Tender: - Joint Exam   No joint exam has been documented for this visit   There is currently no information documented on the homunculus. Go to the Rheumatology activity and complete the homunculus joint exam.  Investigation: Findings:  10/20/18:  ANA+, dsDNA 14, rest of ENA-, sed rate 65, CRP 3, RF<10, CK 29  Component     Latest Ref Rng & Units 10/20/2018  Anti Nuclear Antibody (ANA)     Negative Positive (A)  dsDNA Ab     0 - 9 IU/mL 14 (H)  ENA RNP Ab     0.0 - 0.9 AI <0.2  ENA SM Ab Ser-aCnc     0.0 - 0.9 AI <0.2  Scleroderma (Scl-70) (ENA) Antibody, IgG     0.0 - 0.9 AI <0.2  ENA SSA (RO) Ab     0.0 - 0.9 AI <0.2  ENA SSB (LA) Ab     0.0 - 0.9 AI <0.2  Chromatin Ab SerPl-aCnc     0.0 - 0.9 AI <0.2  Anti JO-1     0.0 - 0.9 AI <0.2  CENTROMERE AB SCREEN  0.0 - 0.9 AI <0.2  SEE BELOW      Comment  Sed Rate     0 - 40 mm/hr 65 (H)  CRP     0 - 10 mg/L 3  RA Latex Turbid.     0.0 - 13.9 IU/mL <10.0  CK Total     32 - 182 U/L 29 (L)   Imaging: No results found.  Recent Labs: Lab Results  Component Value Date   WBC 9.2 10/20/2018   HGB 15.2 10/20/2018   PLT 301 10/20/2018   NA 142 07/16/2018   K 4.2 07/16/2018   CL 103 07/16/2018   CO2 23 07/16/2018   GLUCOSE 146 (H) 07/16/2018   BUN 18 07/16/2018   CREATININE 0.85 07/16/2018   BILITOT 0.3 07/16/2018   ALKPHOS 82 07/16/2018   AST 14 07/16/2018   ALT 13 07/16/2018   PROT 7.0 07/16/2018   ALBUMIN 4.0 07/16/2018   CALCIUM 9.5 07/16/2018   GFRAA 87 07/16/2018    Speciality Comments: No specialty comments available.  Procedures:  No procedures performed Allergies: Codeine   Assessment / Plan:     Visit Diagnoses: Positive ANA (antinuclear antibody) -patient has positive ANA but has no clinical features of autoimmune disease.  No antibody titer was given.  I will obtain additional labs today.  And repeat some of the titers to see if they were persistent.  There is no family history of autoimmune disease.  Plan: ANA, Anti-DNA antibody, double-stranded, C3 and C4, Beta-2 glycoprotein antibodies, Cardiolipin antibodies, IgG, IgM, IgA, Lupus Anticoagulant Eval w/Reflex  Positive double stranded DNA antibody test  Pain in both hands -she complains  of discomfort in her bilateral hands.  No warmth swelling or effusion was noted.  She has some DIP and PIP thickening consistent with osteoarthritis.  I will obtain some additional labs and x-rays today.  Plan: XR Hand 2 View Right, XR Hand 2 View Left, x-rays were consistent with osteoarthritis.  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  Uric acid, Cyclic citrul peptide antibody, IgG, 14-3-3 eta Protein  Chronic pain of both knees -she complains of knee joint pain for many years.  She had right knee joint meniscal tear surgery in the past.  She has difficulty walking due to knee joint discomfort.  She states the discomfort had been going on for more than 6 years.  Plan: XR KNEE 3 VIEW RIGHT, XR KNEE 3 VIEW LEFT.  The x-ray showed bilateral moderate to severe lateral compartment narrowing and severe chondromalacia patella.  A handout on knee exercises was given.  She will eventually require total knee replacement.  Weight loss diet and exercise was discussed.  Chronic midline low back pain without sciatica-she has chronic lower back pain for many years.  It is localized.  I reviewed her chart and could not find previous x-rays.  A handout on back exercises was given.  Other fatigue - Plan: TSH, Glucose 6 phosphate dehydrogenase, Serum protein electrophoresis with reflex  Myalgia-patient states symptoms have been better on Flexeril.  Right leg weakness-relates to a previous injury.  Essential hypertension, benign-blood pressure is fairly well controlled.  Varicose veins of both lower extremities with pain  History of hyperlipidemia  Orders: Orders Placed This Encounter  Procedures  . XR Hand 2 View Right  . XR Hand 2 View Left  . XR KNEE 3 VIEW RIGHT  . XR KNEE 3 VIEW LEFT  . TSH  . Uric acid  .  Cyclic citrul peptide antibody, IgG  . 14-3-3 eta Protein  . ANA  . Anti-DNA antibody, double-stranded  . C3 and C4  . Beta-2 glycoprotein antibodies  .  Cardiolipin antibodies, IgG, IgM, IgA  . Lupus Anticoagulant Eval w/Reflex  . Glucose 6 phosphate dehydrogenase  . Serum protein electrophoresis with reflex   No orders of the defined types were placed in this encounter.   Face-to-face time spent with patient was 50 minutes. Greater than 50% of time was spent in counseling and coordination of care.  Follow-Up Instructions: Return for Positive ANA and double-stranded DNA, osteoarthritis.   Pollyann SavoyShaili Hyun Reali, MD  Note - This record has been created using [employer] software.  Chart creation errors have been sought, but may not always  have been located. Such creation errors do not reflect on  the standard of medical care.

## 2018-12-03 ENCOUNTER — Ambulatory Visit: Payer: Self-pay

## 2018-12-03 ENCOUNTER — Other Ambulatory Visit: Payer: Self-pay

## 2018-12-03 ENCOUNTER — Encounter: Payer: Self-pay | Admitting: Rheumatology

## 2018-12-03 ENCOUNTER — Ambulatory Visit (INDEPENDENT_AMBULATORY_CARE_PROVIDER_SITE_OTHER): Payer: 59 | Admitting: Rheumatology

## 2018-12-03 VITALS — BP 127/84 | HR 74 | Resp 15 | Ht 70.5 in | Wt 316.6 lb

## 2018-12-03 DIAGNOSIS — G8929 Other chronic pain: Secondary | ICD-10-CM

## 2018-12-03 DIAGNOSIS — M791 Myalgia, unspecified site: Secondary | ICD-10-CM

## 2018-12-03 DIAGNOSIS — M79641 Pain in right hand: Secondary | ICD-10-CM | POA: Diagnosis not present

## 2018-12-03 DIAGNOSIS — M25561 Pain in right knee: Secondary | ICD-10-CM | POA: Diagnosis not present

## 2018-12-03 DIAGNOSIS — M545 Low back pain: Secondary | ICD-10-CM

## 2018-12-03 DIAGNOSIS — R29898 Other symptoms and signs involving the musculoskeletal system: Secondary | ICD-10-CM

## 2018-12-03 DIAGNOSIS — M25562 Pain in left knee: Secondary | ICD-10-CM

## 2018-12-03 DIAGNOSIS — M79642 Pain in left hand: Secondary | ICD-10-CM

## 2018-12-03 DIAGNOSIS — R768 Other specified abnormal immunological findings in serum: Secondary | ICD-10-CM

## 2018-12-03 DIAGNOSIS — I83813 Varicose veins of bilateral lower extremities with pain: Secondary | ICD-10-CM

## 2018-12-03 DIAGNOSIS — Z8639 Personal history of other endocrine, nutritional and metabolic disease: Secondary | ICD-10-CM

## 2018-12-03 DIAGNOSIS — R5383 Other fatigue: Secondary | ICD-10-CM

## 2018-12-03 DIAGNOSIS — I10 Essential (primary) hypertension: Secondary | ICD-10-CM

## 2018-12-03 NOTE — Patient Instructions (Signed)
Journal for Nurse Practitioners, 15(4), 263-267. Retrieved January 13, 2018 from [email or web address]">  Knee Exercises Ask your health care provider which exercises are safe for you. Do exercises exactly as told by your health care provider and adjust them as directed. It is normal to feel mild stretching, pulling, tightness, or discomfort as you do these exercises. Stop right away if you feel sudden pain or your pain gets worse. Do not begin these exercises until told by your health care provider. Stretching and range-of-motion exercises These exercises warm up your muscles and joints and improve the movement and flexibility of your knee. These exercises also help to relieve pain and swelling. Knee extension, prone 1. Lie on your abdomen (prone position) on a bed. 2. Place your left / right knee just beyond the edge of the surface so your knee is not on the bed. You can put a towel under your left / right thigh just above your kneecap for comfort. 3. Relax your leg muscles and allow gravity to straighten your knee (extension). You should feel a stretch behind your left / right knee. 4. Hold this position for __________ seconds. 5. Scoot up so your knee is supported between repetitions. Repeat __________ times. Complete this exercise __________ times a day. Knee flexion, active  1. Lie on your back with both legs straight. If this causes back discomfort, bend your left / right knee so your foot is flat on the floor. 2. Slowly slide your left / right heel back toward your buttocks. Stop when you feel a gentle stretch in the front of your knee or thigh (flexion). 3. Hold this position for __________ seconds. 4. Slowly slide your left / right heel back to the starting position. Repeat __________ times. Complete this exercise __________ times a day. Quadriceps stretch, prone  1. Lie on your abdomen on a firm surface, such as a bed or padded floor. 2. Bend your left / right knee and hold  your ankle. If you cannot reach your ankle or pant leg, loop a belt around your foot and grab the belt instead. 3. Gently pull your heel toward your buttocks. Your knee should not slide out to the side. You should feel a stretch in the front of your thigh and knee (quadriceps). 4. Hold this position for __________ seconds. Repeat __________ times. Complete this exercise __________ times a day. Hamstring, supine 1. Lie on your back (supine position). 2. Loop a belt or towel over the ball of your left / right foot. The ball of your foot is on the walking surface, right under your toes. 3. Straighten your left / right knee and slowly pull on the belt to raise your leg until you feel a gentle stretch behind your knee (hamstring). ? Do not let your knee bend while you do this. ? Keep your other leg flat on the floor. 4. Hold this position for __________ seconds. Repeat __________ times. Complete this exercise __________ times a day. Strengthening exercises These exercises build strength and endurance in your knee. Endurance is the ability to use your muscles for a long time, even after they get tired. Quadriceps, isometric This exercise stretches the muscles in front of your thigh (quadriceps) without moving your knee joint (isometric). 1. Lie on your back with your left / right leg extended and your other knee bent. Put a rolled towel or small pillow under your knee if told by your health care provider. 2. Slowly tense the muscles in the front of your left /   right thigh. You should see your kneecap slide up toward your hip or see increased dimpling just above the knee. This motion will push the back of the knee toward the floor. 3. For __________ seconds, hold the muscle as tight as you can without increasing your pain. 4. Relax the muscles slowly and completely. Repeat __________ times. Complete this exercise __________ times a day. Straight leg raises This exercise stretches the muscles in front  of your thigh (quadriceps) and the muscles that move your hips (hip flexors). 1. Lie on your back with your left / right leg extended and your other knee bent. 2. Tense the muscles in the front of your left / right thigh. You should see your kneecap slide up or see increased dimpling just above the knee. Your thigh may even shake a bit. 3. Keep these muscles tight as you raise your leg 4-6 inches (10-15 cm) off the floor. Do not let your knee bend. 4. Hold this position for __________ seconds. 5. Keep these muscles tense as you lower your leg. 6. Relax your muscles slowly and completely after each repetition. Repeat __________ times. Complete this exercise __________ times a day. Hamstring, isometric 1. Lie on your back on a firm surface. 2. Bend your left / right knee about __________ degrees. 3. Dig your left / right heel into the surface as if you are trying to pull it toward your buttocks. Tighten the muscles in the back of your thighs (hamstring) to "dig" as hard as you can without increasing any pain. 4. Hold this position for __________ seconds. 5. Release the tension gradually and allow your muscles to relax completely for __________ seconds after each repetition. Repeat __________ times. Complete this exercise __________ times a day. Hamstring curls If told by your health care provider, do this exercise while wearing ankle weights. Begin with __________ lb weights. Then increase the weight by 1 lb (0.5 kg) increments. Do not wear ankle weights that are more than __________ lb. 1. Lie on your abdomen with your legs straight. 2. Bend your left / right knee as far as you can without feeling pain. Keep your hips flat against the floor. 3. Hold this position for __________ seconds. 4. Slowly lower your leg to the starting position. Repeat __________ times. Complete this exercise __________ times a day. Squats This exercise strengthens the muscles in front of your thigh and knee  (quadriceps). 1. Stand in front of a table, with your feet and knees pointing straight ahead. You may rest your hands on the table for balance but not for support. 2. Slowly bend your knees and lower your hips like you are going to sit in a chair. ? Keep your weight over your heels, not over your toes. ? Keep your lower legs upright so they are parallel with the table legs. ? Do not let your hips go lower than your knees. ? Do not bend lower than told by your health care provider. ? If your knee pain increases, do not bend as low. 3. Hold the squat position for __________ seconds. 4. Slowly push with your legs to return to standing. Do not use your hands to pull yourself to standing. Repeat __________ times. Complete this exercise __________ times a day. Wall slides This exercise strengthens the muscles in front of your thigh and knee (quadriceps). 1. Lean your back against a smooth wall or door, and walk your feet out 18-24 inches (46-61 cm) from it. 2. Place your feet hip-width apart. 3.   Slowly slide down the wall or door until your knees bend __________ degrees. Keep your knees over your heels, not over your toes. Keep your knees in line with your hips. 4. Hold this position for __________ seconds. Repeat __________ times. Complete this exercise __________ times a day. Straight leg raises This exercise strengthens the muscles that rotate the leg at the hip and move it away from your body (hip abductors). 1. Lie on your side with your left / right leg in the top position. Lie so your head, shoulder, knee, and hip line up. You may bend your bottom knee to help you keep your balance. 2. Roll your hips slightly forward so your hips are stacked directly over each other and your left / right knee is facing forward. 3. Leading with your heel, lift your top leg 4-6 inches (10-15 cm). You should feel the muscles in your outer hip lifting. ? Do not let your foot drift forward. ? Do not let your knee  roll toward the ceiling. 4. Hold this position for __________ seconds. 5. Slowly return your leg to the starting position. 6. Let your muscles relax completely after each repetition. Repeat __________ times. Complete this exercise __________ times a day. Straight leg raises This exercise stretches the muscles that move your hips away from the front of the pelvis (hip extensors). 1. Lie on your abdomen on a firm surface. You can put a pillow under your hips if that is more comfortable. 2. Tense the muscles in your buttocks and lift your left / right leg about 4-6 inches (10-15 cm). Keep your knee straight as you lift your leg. 3. Hold this position for __________ seconds. 4. Slowly lower your leg to the starting position. 5. Let your leg relax completely after each repetition. Repeat __________ times. Complete this exercise __________ times a day. This information is not intended to replace advice given to you by your health care provider. Make sure you discuss any questions you have with your health care provider. Document Released: 02/07/2005 Document Revised: 01/14/2018 Document Reviewed: 01/14/2018 [employer] Patient Education  2020 [employer] Back Exercises The following exercises strengthen the muscles that help to support the trunk and back. They also help to keep the lower back flexible. Doing these exercises can help to prevent back pain or lessen existing pain.  If you have back pain or discomfort, try doing these exercises 2-3 times each day or as told by your health care provider.  As your pain improves, do them once each day, but increase the number of times that you repeat the steps for each exercise (do more repetitions).  To prevent the recurrence of back pain, continue to do these exercises once each day or as told by your health care provider. Do exercises exactly as told by your health care provider and adjust them as directed. It is normal to feel mild stretching,  pulling, tightness, or discomfort as you do these exercises, but you should stop right away if you feel sudden pain or your pain gets worse. Exercises Single knee to chest Repeat these steps 3-5 times for each leg: 1. Lie on your back on a firm bed or the floor with your legs extended. 2. Bring one knee to your chest. Your other leg should stay extended and in contact with the floor. 3. Hold your knee in place by grabbing your knee or thigh with both hands and hold. 4. Pull on your knee until you feel a gentle stretch in your   lower back or buttocks. 5. Hold the stretch for 10-30 seconds. 6. Slowly release and straighten your leg. Pelvic tilt Repeat these steps 5-10 times: 1. Lie on your back on a firm bed or the floor with your legs extended. 2. Bend your knees so they are pointing toward the ceiling and your feet are flat on the floor. 3. Tighten your lower abdominal muscles to press your lower back against the floor. This motion will tilt your pelvis so your tailbone points up toward the ceiling instead of pointing to your feet or the floor. 4. With gentle tension and even breathing, hold this position for 5-10 seconds. Cat-cow Repeat these steps until your lower back becomes more flexible: 1. Get into a hands-and-knees position on a firm surface. Keep your hands under your shoulders, and keep your knees under your hips. You may place padding under your knees for comfort. 2. Let your head hang down toward your chest. Contract your abdominal muscles and point your tailbone toward the floor so your lower back becomes rounded like the back of a cat. 3. Hold this position for 5 seconds. 4. Slowly lift your head, let your abdominal muscles relax and point your tailbone up toward the ceiling so your back forms a sagging arch like the back of a cow. 5. Hold this position for 5 seconds.  Press-ups Repeat these steps 5-10 times: 1. Lie on your abdomen (face-down) on the floor. 2. Place your palms  near your head, about shoulder-width apart. 3. Keeping your back as relaxed as possible and keeping your hips on the floor, slowly straighten your arms to raise the top half of your body and lift your shoulders. Do not use your back muscles to raise your upper torso. You may adjust the placement of your hands to make yourself more comfortable. 4. Hold this position for 5 seconds while you keep your back relaxed. 5. Slowly return to lying flat on the floor.  Bridges Repeat these steps 10 times: 1. Lie on your back on a firm surface. 2. Bend your knees so they are pointing toward the ceiling and your feet are flat on the floor. Your arms should be flat at your sides, next to your body. 3. Tighten your buttocks muscles and lift your buttocks off the floor until your waist is at almost the same height as your knees. You should feel the muscles working in your buttocks and the back of your thighs. If you do not feel these muscles, slide your feet 1-2 inches farther away from your buttocks. 4. Hold this position for 3-5 seconds. 5. Slowly lower your hips to the starting position, and allow your buttocks muscles to relax completely. If this exercise is too easy, try doing it with your arms crossed over your chest. Abdominal crunches Repeat these steps 5-10 times: 1. Lie on your back on a firm bed or the floor with your legs extended. 2. Bend your knees so they are pointing toward the ceiling and your feet are flat on the floor. 3. Cross your arms over your chest. 4. Tip your chin slightly toward your chest without bending your neck. 5. Tighten your abdominal muscles and slowly raise your trunk (torso) high enough to lift your shoulder blades a tiny bit off the floor. Avoid raising your torso higher than that because it can put too much stress on your low back and does not help to strengthen your abdominal muscles. 6. Slowly return to your starting position. Back lifts Repeat these   steps 5-10 times:  1. Lie on your abdomen (face-down) with your arms at your sides, and rest your forehead on the floor. 2. Tighten the muscles in your legs and your buttocks. 3. Slowly lift your chest off the floor while you keep your hips pressed to the floor. Keep the back of your head in line with the curve in your back. Your eyes should be looking at the floor. 4. Hold this position for 3-5 seconds. 5. Slowly return to your starting position. Contact a health care provider if:  Your back pain or discomfort gets much worse when you do an exercise.  Your worsening back pain or discomfort does not lessen within 2 hours after you exercise. If you have any of these problems, stop doing these exercises right away. Do not do them again unless your health care provider says that you can. Get help right away if:  You develop sudden, severe back pain. If this happens, stop doing the exercises right away. Do not do them again unless your health care provider says that you can. This information is not intended to replace advice given to you by your health care provider. Make sure you discuss any questions you have with your health care provider. Document Released: 05/03/2004 Document Revised: 07/31/2018 Document Reviewed: 12/26/2017 [employer] Patient Education  2020 [employer]  

## 2018-12-05 NOTE — Progress Notes (Signed)
I will discuss results at the fu visit.

## 2018-12-08 LAB — PROTEIN ELECTROPHORESIS, SERUM, WITH REFLEX
Abnormal Protein Band1: 1.3 g/dL — ABNORMAL HIGH
Albumin ELP: 3.7 g/dL — ABNORMAL LOW (ref 3.8–4.8)
Alpha 1: 0.3 g/dL (ref 0.2–0.3)
Alpha 2: 0.7 g/dL (ref 0.5–0.9)
Beta 2: 0.3 g/dL (ref 0.2–0.5)
Beta Globulin: 0.4 g/dL (ref 0.4–0.6)
Gamma Globulin: 1.5 g/dL (ref 0.8–1.7)
Total Protein: 6.9 g/dL (ref 6.1–8.1)

## 2018-12-08 LAB — ANTI-DNA ANTIBODY, DOUBLE-STRANDED: ds DNA Ab: 15 IU/mL — ABNORMAL HIGH

## 2018-12-08 LAB — CARDIOLIPIN ANTIBODIES, IGG, IGM, IGA
Anticardiolipin IgA: <11 [APL'U]
Anticardiolipin IgG: <14 [GPL'U]
Anticardiolipin IgM: <12 [MPL'U]

## 2018-12-08 LAB — 14-3-3 ETA PROTEIN: 14-3-3 eta Protein: <0.2 ng/mL (ref <0.2)

## 2018-12-08 LAB — IFE INTERPRETATION: Immunofix Electr Int: DETECTED

## 2018-12-08 LAB — CYCLIC CITRUL PEPTIDE ANTIBODY, IGG: Cyclic Citrullin Peptide Ab: <16 UNITS

## 2018-12-08 LAB — BETA-2 GLYCOPROTEIN ANTIBODIES
Beta-2 Glyco 1 IgA: <9 SAU (ref <=20)
Beta-2 Glyco 1 IgM: 9 SMU (ref <=20)
Beta-2 Glyco I IgG: <9 SGU (ref <=20)

## 2018-12-08 LAB — URIC ACID: Uric Acid, Serum: 4.6 mg/dL (ref 2.5–7.0)

## 2018-12-08 LAB — GLUCOSE 6 PHOSPHATE DEHYDROGENASE: G-6PDH: 16.5 U/g Hgb (ref 7.0–20.5)

## 2018-12-08 LAB — TSH: TSH: 3.79 mIU/L (ref 0.40–4.50)

## 2018-12-08 LAB — C3 AND C4
C3 Complement: 131 mg/dL (ref 83–193)
C4 Complement: 8 mg/dL — ABNORMAL LOW (ref 15–57)

## 2018-12-08 LAB — LUPUS ANTICOAGULANT EVAL W/ REFLEX
PTT-LA Screen: 30 s (ref <=40)
dRVVT: 34 s (ref <=45)

## 2018-12-08 LAB — ANA: Anti Nuclear Antibody (ANA): NEGATIVE

## 2018-12-16 NOTE — Progress Notes (Signed)
Office Visit Note  Patient: Tracie [name]             Date of Birth: 1960-11-28           MRN: [id number]             PCP: Myles Lipps, MD Referring: Myles Lipps, MD Visit Date: 12/30/2018 Occupation: @GUAROCC @  Subjective:  Discuss lab work   History of Present Illness: Tracie [name] is a 58 y.o. female with history of positive ANA and osteoarthritis.  She reports she continues to have chronic pain in both knee joints.  She states she has intermittent right knee joint swelling.  She had a meniscal repair in the right knee joint in the past.  She denies any pain or joint swelling in her hands at this time.  She denies any lower back pain at this time.  She denies any recent rashes.  She denies any oral or nasal ulcerations.  She has occasional mouth dryness but no eye dryness.  She denies any symptoms of Raynaud's.  She denies any shortness of breath, chest pain, or palpitations.   Activities of Daily Living:  Patient reports morning stiffness for a few minutes.   Patient Reports nocturnal pain.  Difficulty dressing/grooming: Denies Difficulty climbing stairs: Reports Difficulty getting out of chair: Reports Difficulty using hands for taps, buttons, cutlery, and/or writing: Denies  Review of Systems  Constitutional: Negative for fatigue.  HENT: Positive for mouth dryness. Negative for mouth sores and nose dryness.   Eyes: Negative for itching and dryness.  Respiratory: Negative for shortness of breath, wheezing and difficulty breathing.   Cardiovascular: Negative for chest pain and palpitations.  Gastrointestinal: Negative for blood in stool, constipation and diarrhea.  Endocrine: Negative for increased urination.  Genitourinary: Negative for difficulty urinating and painful urination.  Musculoskeletal: Positive for arthralgias, joint pain, joint swelling, muscle weakness and morning stiffness.  Skin: Negative for rash and hair loss.  Allergic/Immunologic:  Negative for susceptible to infections.  Neurological: Negative for dizziness, headaches, memory loss and weakness.  Psychiatric/Behavioral: Negative for confusion and sleep disturbance.    PMFS History:  Patient Active Problem List   Diagnosis Date Noted   Myalgia 10/27/2018   Arthralgia of knee, right 10/27/2018   Varicose veins of both lower extremities with pain 07/16/2018   Hyperlipidemia 06/03/2018   Essential hypertension, benign 06/03/2018    Past Medical History:  Diagnosis Date   History of DVT (deep vein thrombosis) 2010   Hyperlipidemia    Hypertension     Family History  Problem Relation Age of Onset   Hypertension Mother    Heart attack Father    Heart disease Brother    Healthy Son    Breast cancer Neg Hx    Past Surgical History:  Procedure Laterality Date   ABDOMINAL HYSTERECTOMY     FRACTURE SURGERY Left    hand   KNEE ARTHROSCOPY Right 1990s   Social History   Social History Narrative   Not on file    There is no immunization history on file for this patient.   Objective: Vital Signs: BP 123/87 (BP Location: Right Arm, Patient Position: Sitting, Cuff Size: Large)    Pulse (!) 48    Resp 16    Ht 5' 10.5" (1.791 m)    Wt (!) 310 lb (140.6 kg)    BMI 43.85 kg/m    Physical Exam Vitals signs and nursing note reviewed.  Constitutional:  Appearance: She is well-developed.  HENT:     Head: Normocephalic and atraumatic.  Eyes:     Conjunctiva/sclera: Conjunctivae normal.  Neck:     Musculoskeletal: Normal range of motion.  Cardiovascular:     Rate and Rhythm: Normal rate and regular rhythm.     Heart sounds: Normal heart sounds.  Pulmonary:     Effort: Pulmonary effort is normal.     Breath sounds: Normal breath sounds.  Abdominal:     General: Bowel sounds are normal.     Palpations: Abdomen is soft.  Lymphadenopathy:     Cervical: No cervical adenopathy.  Skin:    General: Skin is warm and dry.     Capillary  Refill: Capillary refill takes less than 2 seconds.  Neurological:     Mental Status: She is alert and oriented to person, place, and time.  Psychiatric:        Behavior: Behavior normal.      Musculoskeletal Exam: C-spine, thoracic spine, and lumbar spine good ROM.  No midline spinal tenderness.  No SI joint tenderness.  Shoulder joints, elbow joints, wrist joints, MCPs, PIPs, and DIPs good ROM with no synovitis. PIP and DIP synovial thickening.  Hip joints, knee joints, ankle joints, MTPs, PIPs, and DIPs good ROM with no synovitis.  No warmth or effusion of knee joints.  No tenderness or swelling of ankle joints.   CDAI Exam: CDAI Score: -- Patient Global: --; Provider Global: -- Swollen: --; Tender: -- Joint Exam   No joint exam has been documented for this visit   There is currently no information documented on the homunculus. Go to the Rheumatology activity and complete the homunculus joint exam.  Investigation: No additional findings.  Imaging: Mm Digital Screening  Result Date: 12/23/2018 CLINICAL DATA:  Screening. EXAM: DIGITAL SCREENING BILATERAL MAMMOGRAM WITH CAD COMPARISON:  Previous exam(s). ACR Breast Density Category b: There are scattered areas of fibroglandular density. FINDINGS: There are no findings suspicious for malignancy. Images were processed with CAD. IMPRESSION: No mammographic evidence of malignancy. A result letter of this screening mammogram will be mailed directly to the patient. RECOMMENDATION: Screening mammogram in one year. (Code:SM-B-01Y) BI-RADS CATEGORY  1: Negative. Electronically Signed   By: Edwin CapJennifer  Jarosz M.D.   On: 12/23/2018 09:10   Xr Hand 2 View Left  Result Date: 12/03/2018 Severe PIP and DIP narrowing was noted.  No MCP changes were noted.  No metacarpocarpal joint narrowing was noted.  Fusion of all intercarpal bones were noted.  No erosive changes were noted. Impression: These findings are consistent with osteoarthritis.  The  intercarpal joint fusion which be due to previous injury.  Xr Hand 2 View Right  Result Date: 12/03/2018 PIP and DIP joint space narrowing was noted.  No MCP, intercarpal radiocarpal joint space narrowing was noted.  No erosive changes were noted. Impression: These findings are consistent with osteoarthritis of the hand.  Xr Knee 3 View Left  Result Date: 12/03/2018 Moderate to severe lateral compartment narrowing and lateral osteophytes with intercondylar osteophytes were noted.  No chondrocalcinosis was noted.  Severe patellofemoral narrowing was noted. Impression: These findings are consistent with moderate to severe lateral compartment narrowing and severe patello moderate to severe femoral narrowing.  Xr Knee 3 View Right  Result Date: 12/03/2018 Moderate to severe lateral compartment narrowing and lateral osteophytes with intercondylar osteophytes were noted.  No chondrocalcinosis was noted.  Severe patellofemoral narrowing was noted. Impression: These findings are consistent with moderate to severe lateral compartment  narrowing and severe patello moderate to severe femoral narrowing.   Recent Labs: Lab Results  Component Value Date   WBC 9.2 10/20/2018   HGB 15.2 10/20/2018   PLT 301 10/20/2018   NA 142 07/16/2018   K 4.2 07/16/2018   CL 103 07/16/2018   CO2 23 07/16/2018   GLUCOSE 146 (H) 07/16/2018   BUN 18 07/16/2018   CREATININE 0.85 07/16/2018   BILITOT 0.3 07/16/2018   ALKPHOS 82 07/16/2018   AST 14 07/16/2018   ALT 13 07/16/2018   PROT 6.9 12/03/2018   ALBUMIN 4.0 07/16/2018   CALCIUM 9.5 07/16/2018   GFRAA 87 07/16/2018  December 03, 2018 IFE IgG kappa monoclonal protein positive, G6PD normal, ANA negative, dsDNA 15, beta-2 negative, anticardiolipin negative, lupus anticoagulant negative, C3 normal, C4 8 low, anti-CCP negative, 14 3 3  eta negative, TSH normal, uric acid 4.6  10/20/18: ANA+, dsDNA 14, rest of ENA-, sed rate 65, CRP 3, RF<10, CK 29  Speciality  Comments: No specialty comments available.  Procedures:  No procedures performed Allergies: Codeine   Assessment / Plan:     Visit Diagnoses: Positive ANA (antinuclear antibody) - Repeat ANA negative, dsDNA 15, C4 low: Lab work from 12/03/2018 was reviewed with the patient today in the office.  All questions were addressed.  She has no clinical features of autoimmune disease at this time.  We will continue to monitor lab work on a regular basis.  An order for a vice labs was provided to the patient.  She will have AVISE labs drawn 2 weeks prior to her appointment in 6 months so we can discuss those results at the follow-up visit.  She was advised to notify us if she develops any new or worsening symptoms.  She will follow-up in 6 months.  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  We will check a vice labs in about 6 months and discuss at the follow-up visit.  Primary osteoarthritis of both hands: She has PIP and DIP synovial thickening consistent with osteoarthritis of bilateral hands.  She has no tenderness or synovitis.  She has complete laceration bilaterally.  Joint protection and muscle strengthening were discussed.  Primary osteoarthritis of both knees - Bilateral moderate to severe lateral compartment narrowing and severe chondromalacia patella: She has chronic pain in both knee joints.  She has good range of motion with some discomfort in the right knee joint.  She experiences an intermittent joint swelling in the right knee.  She previously had a right knee meniscal tear.  She was encouraged to perform knee joint exercises.  We discussed a cortisone injection in the future if her pain is persistent or worsens.  We discussed the importance of lower extremity muscle strengthening.  Chronic midline low back pain without sciatica: She has no lower back pain at this time.  She has no symptoms of  sciatica.  Other fatigue: She denies fatigue at this time.   Myalgia: She has not had any increased muscle aches or muscle weakness.   Abnormal SPEP - Abnormal SPEP and IFE on 12/03/18. A faint monoclonal free lambda light chain was detected.  We will refer to hematology for further evaluation.  Plan: Ambulatory referral to Hematology  Other medical conditions are listed as follows:   Right leg weakness - Previous injury.  Essential hypertension, benign  Varicose veins of both lower extremities with pain  History of hyperlipidemia  Orders: Orders Placed This Encounter  Procedures   Ambulatory referral to Hematology   No orders of the defined types were placed in this encounter.    Follow-Up Instructions: Return in about 6 months (around 06/29/2019) for Positive ANA, Osteoarthritis (order AVISE labs at FU visit) .   Tracie Bienenstockaylor M Anjana Cheek, PA-C   I examined and evaluated the patient with Sherron Alesaylor Jeury Mcnab PA.  Patient has positive ANA, double-stranded DNA and hypocomplementemia although she has no clinical features of autoimmune disease.  We had detailed discussion regarding the autoimmune disease and its symptoms.  If she develops any new symptoms she should notify us.  Otherwise we will repeat labs in 6 months.  She also has abnormal IFE and we will refer her to hematology.  The plan of care was discussed as noted above.  Pollyann SavoyShaili Deveshwar, MD  Note - This record has been created using [employer] software.  Chart creation errors have been sought, but may not always  have been located. Such creation errors do not reflect on  the standard of medical care.

## 2018-12-22 ENCOUNTER — Other Ambulatory Visit: Payer: Self-pay

## 2018-12-22 ENCOUNTER — Ambulatory Visit
Admission: RE | Admit: 2018-12-22 | Discharge: 2018-12-22 | Disposition: A | Discharge status: Home / Self Care | Payer: 59 | Source: Ambulatory Visit | Attending: Family Medicine | Admitting: Family Medicine

## 2018-12-22 DIAGNOSIS — Z1231 Encounter for screening mammogram for malignant neoplasm of breast: Secondary | ICD-10-CM

## 2018-12-30 ENCOUNTER — Encounter: Payer: Self-pay | Admitting: Rheumatology

## 2018-12-30 ENCOUNTER — Other Ambulatory Visit: Payer: Self-pay

## 2018-12-30 ENCOUNTER — Ambulatory Visit (INDEPENDENT_AMBULATORY_CARE_PROVIDER_SITE_OTHER): Payer: 59 | Admitting: Rheumatology

## 2018-12-30 VITALS — BP 123/87 | HR 48 | Resp 16 | Ht 70.5 in | Wt 310.0 lb

## 2018-12-30 DIAGNOSIS — M545 Low back pain, unspecified: Secondary | ICD-10-CM

## 2018-12-30 DIAGNOSIS — R5383 Other fatigue: Secondary | ICD-10-CM

## 2018-12-30 DIAGNOSIS — Z8639 Personal history of other endocrine, nutritional and metabolic disease: Secondary | ICD-10-CM

## 2018-12-30 DIAGNOSIS — I83813 Varicose veins of bilateral lower extremities with pain: Secondary | ICD-10-CM

## 2018-12-30 DIAGNOSIS — I10 Essential (primary) hypertension: Secondary | ICD-10-CM

## 2018-12-30 DIAGNOSIS — R778 Other specified abnormalities of plasma proteins: Secondary | ICD-10-CM

## 2018-12-30 DIAGNOSIS — G8929 Other chronic pain: Secondary | ICD-10-CM

## 2018-12-30 DIAGNOSIS — M170 Bilateral primary osteoarthritis of knee: Secondary | ICD-10-CM | POA: Diagnosis not present

## 2018-12-30 DIAGNOSIS — R768 Other specified abnormal immunological findings in serum: Secondary | ICD-10-CM | POA: Diagnosis not present

## 2018-12-30 DIAGNOSIS — R29898 Other symptoms and signs involving the musculoskeletal system: Secondary | ICD-10-CM

## 2018-12-30 DIAGNOSIS — M19041 Primary osteoarthritis, right hand: Secondary | ICD-10-CM

## 2018-12-30 DIAGNOSIS — M19042 Primary osteoarthritis, left hand: Secondary | ICD-10-CM

## 2018-12-30 DIAGNOSIS — M791 Myalgia, unspecified site: Secondary | ICD-10-CM

## 2018-12-30 DIAGNOSIS — R7689 Other specified abnormal immunological findings in serum: Secondary | ICD-10-CM

## 2018-12-30 NOTE — Patient Instructions (Signed)
Journal for Nurse Practitioners, 15(4), 263-267. Retrieved January 13, 2018 from http://clinicalkey.com/nursing">  Knee Exercises Ask your health care provider which exercises are safe for you. Do exercises exactly as told by your health care provider and adjust them as directed. It is normal to feel mild stretching, pulling, tightness, or discomfort as you do these exercises. Stop right away if you feel sudden pain or your pain gets worse. Do not begin these exercises until told by your health care provider. Stretching and range-of-motion exercises These exercises warm up your muscles and joints and improve the movement and flexibility of your knee. These exercises also help to relieve pain and swelling. Knee extension, prone 1. Lie on your abdomen (prone position) on a bed. 2. Place your left / right knee just beyond the edge of the surface so your knee is not on the bed. You can put a towel under your left / right thigh just above your kneecap for comfort. 3. Relax your leg muscles and allow gravity to straighten your knee (extension). You should feel a stretch behind your left / right knee. 4. Hold this position for __________ seconds. 5. Scoot up so your knee is supported between repetitions. Repeat __________ times. Complete this exercise __________ times a day. Knee flexion, active  1. Lie on your back with both legs straight. If this causes back discomfort, bend your left / right knee so your foot is flat on the floor. 2. Slowly slide your left / right heel back toward your buttocks. Stop when you feel a gentle stretch in the front of your knee or thigh (flexion). 3. Hold this position for __________ seconds. 4. Slowly slide your left / right heel back to the starting position. Repeat __________ times. Complete this exercise __________ times a day. Quadriceps stretch, prone  1. Lie on your abdomen on a firm surface, such as a bed or padded floor. 2. Bend your left / right knee and hold  your ankle. If you cannot reach your ankle or pant leg, loop a belt around your foot and grab the belt instead. 3. Gently pull your heel toward your buttocks. Your knee should not slide out to the side. You should feel a stretch in the front of your thigh and knee (quadriceps). 4. Hold this position for __________ seconds. Repeat __________ times. Complete this exercise __________ times a day. Hamstring, supine 1. Lie on your back (supine position). 2. Loop a belt or towel over the ball of your left / right foot. The ball of your foot is on the walking surface, right under your toes. 3. Straighten your left / right knee and slowly pull on the belt to raise your leg until you feel a gentle stretch behind your knee (hamstring). ? Do not let your knee bend while you do this. ? Keep your other leg flat on the floor. 4. Hold this position for __________ seconds. Repeat __________ times. Complete this exercise __________ times a day. Strengthening exercises These exercises build strength and endurance in your knee. Endurance is the ability to use your muscles for a long time, even after they get tired. Quadriceps, isometric This exercise stretches the muscles in front of your thigh (quadriceps) without moving your knee joint (isometric). 1. Lie on your back with your left / right leg extended and your other knee bent. Put a rolled towel or small pillow under your knee if told by your health care provider. 2. Slowly tense the muscles in the front of your left /   right thigh. You should see your kneecap slide up toward your hip or see increased dimpling just above the knee. This motion will push the back of the knee toward the floor. 3. For __________ seconds, hold the muscle as tight as you can without increasing your pain. 4. Relax the muscles slowly and completely. Repeat __________ times. Complete this exercise __________ times a day. Straight leg raises This exercise stretches the muscles in front  of your thigh (quadriceps) and the muscles that move your hips (hip flexors). 1. Lie on your back with your left / right leg extended and your other knee bent. 2. Tense the muscles in the front of your left / right thigh. You should see your kneecap slide up or see increased dimpling just above the knee. Your thigh may even shake a bit. 3. Keep these muscles tight as you raise your leg 4-6 inches (10-15 cm) off the floor. Do not let your knee bend. 4. Hold this position for __________ seconds. 5. Keep these muscles tense as you lower your leg. 6. Relax your muscles slowly and completely after each repetition. Repeat __________ times. Complete this exercise __________ times a day. Hamstring, isometric 1. Lie on your back on a firm surface. 2. Bend your left / right knee about __________ degrees. 3. Dig your left / right heel into the surface as if you are trying to pull it toward your buttocks. Tighten the muscles in the back of your thighs (hamstring) to "dig" as hard as you can without increasing any pain. 4. Hold this position for __________ seconds. 5. Release the tension gradually and allow your muscles to relax completely for __________ seconds after each repetition. Repeat __________ times. Complete this exercise __________ times a day. Hamstring curls If told by your health care provider, do this exercise while wearing ankle weights. Begin with __________ lb weights. Then increase the weight by 1 lb (0.5 kg) increments. Do not wear ankle weights that are more than __________ lb. 1. Lie on your abdomen with your legs straight. 2. Bend your left / right knee as far as you can without feeling pain. Keep your hips flat against the floor. 3. Hold this position for __________ seconds. 4. Slowly lower your leg to the starting position. Repeat __________ times. Complete this exercise __________ times a day. Squats This exercise strengthens the muscles in front of your thigh and knee  (quadriceps). 1. Stand in front of a table, with your feet and knees pointing straight ahead. You may rest your hands on the table for balance but not for support. 2. Slowly bend your knees and lower your hips like you are going to sit in a chair. ? Keep your weight over your heels, not over your toes. ? Keep your lower legs upright so they are parallel with the table legs. ? Do not let your hips go lower than your knees. ? Do not bend lower than told by your health care provider. ? If your knee pain increases, do not bend as low. 3. Hold the squat position for __________ seconds. 4. Slowly push with your legs to return to standing. Do not use your hands to pull yourself to standing. Repeat __________ times. Complete this exercise __________ times a day. Wall slides This exercise strengthens the muscles in front of your thigh and knee (quadriceps). 1. Lean your back against a smooth wall or door, and walk your feet out 18-24 inches (46-61 cm) from it. 2. Place your feet hip-width apart. 3.   Slowly slide down the wall or door until your knees bend __________ degrees. Keep your knees over your heels, not over your toes. Keep your knees in line with your hips. 4. Hold this position for __________ seconds. Repeat __________ times. Complete this exercise __________ times a day. Straight leg raises This exercise strengthens the muscles that rotate the leg at the hip and move it away from your body (hip abductors). 1. Lie on your side with your left / right leg in the top position. Lie so your head, shoulder, knee, and hip line up. You may bend your bottom knee to help you keep your balance. 2. Roll your hips slightly forward so your hips are stacked directly over each other and your left / right knee is facing forward. 3. Leading with your heel, lift your top leg 4-6 inches (10-15 cm). You should feel the muscles in your outer hip lifting. ? Do not let your foot drift forward. ? Do not let your knee  roll toward the ceiling. 4. Hold this position for __________ seconds. 5. Slowly return your leg to the starting position. 6. Let your muscles relax completely after each repetition. Repeat __________ times. Complete this exercise __________ times a day. Straight leg raises This exercise stretches the muscles that move your hips away from the front of the pelvis (hip extensors). 1. Lie on your abdomen on a firm surface. You can put a pillow under your hips if that is more comfortable. 2. Tense the muscles in your buttocks and lift your left / right leg about 4-6 inches (10-15 cm). Keep your knee straight as you lift your leg. 3. Hold this position for __________ seconds. 4. Slowly lower your leg to the starting position. 5. Let your leg relax completely after each repetition. Repeat __________ times. Complete this exercise __________ times a day. This information is not intended to replace advice given to you by your health care provider. Make sure you discuss any questions you have with your health care provider. Document Released: 02/07/2005 Document Revised: 01/14/2018 Document Reviewed: 01/14/2018 Elsevier Patient Education  2020 [employer]  

## 2019-01-19 ENCOUNTER — Other Ambulatory Visit: Payer: Self-pay

## 2019-01-19 ENCOUNTER — Ambulatory Visit: Payer: 59 | Admitting: Family Medicine

## 2019-01-19 ENCOUNTER — Encounter: Payer: Self-pay | Admitting: Family Medicine

## 2019-01-19 VITALS — BP 124/83 | HR 83 | Temp 98.5°F | Ht 70.5 in | Wt 309.0 lb

## 2019-01-19 DIAGNOSIS — E782 Mixed hyperlipidemia: Secondary | ICD-10-CM

## 2019-01-19 DIAGNOSIS — E119 Type 2 diabetes mellitus without complications: Secondary | ICD-10-CM | POA: Diagnosis not present

## 2019-01-19 DIAGNOSIS — M791 Myalgia, unspecified site: Secondary | ICD-10-CM

## 2019-01-19 DIAGNOSIS — I10 Essential (primary) hypertension: Secondary | ICD-10-CM | POA: Diagnosis not present

## 2019-01-19 MED ORDER — AMLODIPINE BESYLATE 10 MG PO TABS: 10.0000 mg | ORAL_TABLET | Freq: Every day | ORAL | 1 refills | Status: DC

## 2019-01-19 MED ORDER — METOPROLOL SUCCINATE ER 50 MG PO TB24: 50.0000 mg | ORAL_TABLET | Freq: Every day | ORAL | 1 refills | Status: DC

## 2019-01-19 MED ORDER — LOSARTAN POTASSIUM 100 MG PO TABS: 100.0000 mg | ORAL_TABLET | Freq: Every day | ORAL | 1 refills | Status: DC

## 2019-01-19 NOTE — Patient Instructions (Signed)
° ° ° °  If you have lab work done today you will be contacted with your lab results within the next 2 weeks.  If you have not heard from us then please contact us. The fastest way to get your results is to register for My Chart. ° ° °IF you received an x-ray today, you will receive an invoice from [facility]. Please contact [facility] at [phone] with questions or concerns regarding your invoice.  ° °IF you received labwork today, you will receive an invoice from [facility]. Please contact [facility] at [phone] with questions or concerns regarding your invoice.  ° °Our billing staff will not be able to assist you with questions regarding bills from these companies. ° °You will be contacted with the lab results as soon as they are available. The fastest way to get your results is to activate your My Chart account. Instructions are located on the last page of this paperwork. If you have not heard from us regarding the results in 2 weeks, please contact this office. °  ° ° ° °

## 2019-01-19 NOTE — Progress Notes (Signed)
10/12/20209:23 AM  Tracie [name] [birth year], 58 y.o., female [id number]  Chief Complaint  Patient presents with  . Hypertension    HPI:   Patient is a 58 y.o. female with past medical history significant for HTN, HLP, DM2 who presents today for routine followup  Last OV July 2020 sent to rheum - positive ANA wo features of disease, referred to heme for abnormal spep, OA New diagnosis of DM2, a1c 6.8 - she has been working on her diet, trying to exercise more Muscle relaxant has been helping her right quad Declines flu vaccine Has not acute concerns today  Lab Results  Component Value Date   HGBA1C 6.8 (H) 10/20/2018   Lab Results  Component Value Date   LDLCALC 85 07/16/2018   CREATININE 0.85 07/16/2018    Depression screen PHQ 2/9 01/19/2019 10/20/2018 10/20/2018  Decreased Interest 0 0 -  Down, Depressed, Hopeless 0 0 0  PHQ - 2 Score 0 0 0    Fall Risk  01/19/2019 10/20/2018  Falls in the past year? 0 0  Number falls in past yr: 0 0  Injury with Fall? 0 0     Allergies  Allergen Reactions  . Codeine     Prior to Admission medications   Medication Sig Start Date End Date Taking? Authorizing Provider  amLODipine (NORVASC) 10 MG tablet Take 1 tablet (10 mg total) by mouth daily. 10/20/18  Yes Myles Lipps, MD  losartan (COZAAR) 100 MG tablet Take 1 tablet (100 mg total) by mouth daily. 10/20/18  Yes Myles Lipps, MD  metoprolol succinate (TOPROL-XL) 50 MG 24 hr tablet Take 1 tablet (50 mg total) by mouth daily. Take with or immediately following a meal. 10/20/18  Yes Myles Lipps, MD    Past Medical History:  Diagnosis Date  . History of DVT (deep vein thrombosis) 2010  . Hyperlipidemia   . Hypertension     Past Surgical History:  Procedure Laterality Date  . ABDOMINAL HYSTERECTOMY    . FRACTURE SURGERY Left    hand  . KNEE ARTHROSCOPY Right 1990s    Social History   Tobacco Use  . Smoking status: Former Smoker    Packs/day:  1.00    Years: 20.00    Pack years: 20.00    Types: Cigarettes    Quit date: 2010    Years since quitting: 10.7  . Smokeless tobacco: Never Used  Substance Use Topics  . Alcohol use: Never    Frequency: Never    Family History  Problem Relation Age of Onset  . Hypertension Mother   . Heart attack Father   . Heart disease Brother   . Healthy Son   . Breast cancer Neg Hx     Review of Systems  Constitutional: Negative for chills and fever.  Respiratory: Negative for cough and shortness of breath.   Cardiovascular: Negative for chest pain, palpitations and leg swelling.  Gastrointestinal: Negative for abdominal pain, nausea and vomiting.     OBJECTIVE:  Today's Vitals   01/19/19 0908  BP: 124/83  Pulse: 83  Temp: 98.5 F (36.9 C)  SpO2: 96%  Weight: (!) 309 lb (140.2 kg)  Height: 5' 10.5" (1.791 m)   Body mass index is 43.71 kg/m.  Wt Readings from Last 3 Encounters:  01/19/19 (!) 309 lb (140.2 kg)  12/30/18 (!) 310 lb (140.6 kg)  12/03/18 (!) 316 lb 9.6 oz (143.6 kg)    Physical Exam Vitals signs and nursing note  reviewed.  Constitutional:      Appearance: She is well-developed.  HENT:     Head: Normocephalic and atraumatic.     Mouth/Throat:     Pharynx: No oropharyngeal exudate.  Eyes:     General: No scleral icterus.    Conjunctiva/sclera: Conjunctivae normal.     Pupils: Pupils are equal, round, and reactive to light.  Neck:     Musculoskeletal: Neck supple.  Cardiovascular:     Rate and Rhythm: Normal rate and regular rhythm.     Heart sounds: Normal heart sounds. No murmur. No friction rub. No gallop.   Pulmonary:     Effort: Pulmonary effort is normal.     Breath sounds: Normal breath sounds. No wheezing or rales.  Lymphadenopathy:     Cervical: No cervical adenopathy.  Skin:    General: Skin is warm and dry.  Neurological:     Mental Status: She is alert and oriented to person, place, and time.     No results found for this or any  previous visit (from the past 24 hour(s)).  No results found.   ASSESSMENT and PLAN  1. Essential hypertension, benign Controlled. Continue current regime.  - Lipid panel - Comprehensive metabolic panel  2. New onset type 2 diabetes mellitus ([place]) Labs pending, cont with LFM - Hemoglobin A1c  3. Mixed hyperlipidemia Checking labs today, medications will be adjusted as needed.   4. Myalgia Resolved. Cont with LFM and flexeril prn.  Other orders - amLODipine (NORVASC) 10 MG tablet; Take 1 tablet (10 mg total) by mouth daily. - losartan (COZAAR) 100 MG tablet; Take 1 tablet (100 mg total) by mouth daily. - metoprolol succinate (TOPROL-XL) 50 MG 24 hr tablet; Take 1 tablet (50 mg total) by mouth daily. Take with or immediately following a meal.  No follow-ups on file.    Rutherford Guys, MD Primary Care at [place], [place] [address] Ph.  [phone] Fax [phone]

## 2019-01-20 LAB — COMPREHENSIVE METABOLIC PANEL
ALT: 13 IU/L (ref 0–32)
AST: 14 IU/L (ref 0–40)
Albumin/Globulin Ratio: 1.5 (ref 1.2–2.2)
Albumin: 4.1 g/dL (ref 3.8–4.9)
Alkaline Phosphatase: 70 IU/L (ref 39–117)
BUN/Creatinine Ratio: 15 (ref 12–28)
BUN: 12 mg/dL (ref 8–27)
Bilirubin Total: 0.6 mg/dL (ref 0.0–1.2)
CO2: 22 mmol/L (ref 20–29)
Calcium: 9.7 mg/dL (ref 8.7–10.3)
Chloride: 108 mmol/L — ABNORMAL HIGH (ref 96–106)
Creatinine, Ser: 0.78 mg/dL (ref 0.57–1.00)
GFR calc Af Amer: 96 mL/min/{1.73_m2} (ref >59)
GFR calc non Af Amer: 83 mL/min/{1.73_m2} (ref >59)
Globulin, Total: 2.8 g/dL (ref 1.5–4.5)
Glucose: 168 mg/dL — ABNORMAL HIGH (ref 65–99)
Potassium: 4.3 mmol/L (ref 3.5–5.2)
Sodium: 143 mmol/L (ref 134–144)
Total Protein: 6.9 g/dL (ref 6.0–8.5)

## 2019-01-20 LAB — HEMOGLOBIN A1C
Est. average glucose Bld gHb Est-mCnc: 154 mg/dL
Hgb A1c MFr Bld: 7 % — ABNORMAL HIGH (ref 4.8–5.6)

## 2019-01-20 LAB — LIPID PANEL
Chol/HDL Ratio: 3.2 ratio (ref 0.0–4.4)
Cholesterol, Total: 154 mg/dL (ref 100–199)
HDL: 48 mg/dL (ref >39)
LDL Chol Calc (NIH): 93 mg/dL (ref 0–99)
Triglycerides: 64 mg/dL (ref 0–149)
VLDL Cholesterol Cal: 13 mg/dL (ref 5–40)

## 2019-01-21 ENCOUNTER — Telehealth: Payer: Self-pay | Admitting: Family Medicine

## 2019-01-21 NOTE — Telephone Encounter (Signed)
Patient is requesting medication no longer on current med list.See message below.

## 2019-01-21 NOTE — Telephone Encounter (Signed)
Medication Refill - Medication:  cyclobenzaprine (FLEXERIL) 10 MG tablet   Has the patient contacted their pharmacy? Yes advised to call. Patient changed her mind on stopping medication as pain has come back.   Preferred Pharmacy (with phone number or street name):  [id number] - [place], [place] [phone] (Phone) [phone] (Fax)   Agent: Please be advised that RX refills may take up to 3 business days. We ask that you follow-up with your pharmacy.

## 2019-01-22 NOTE — Telephone Encounter (Signed)
Patient calling back to check status of this request.  

## 2019-01-27 ENCOUNTER — Encounter: Payer: Self-pay | Admitting: Radiology

## 2019-01-27 MED ORDER — CYCLOBENZAPRINE HCL 10 MG PO TABS: 10.0000 mg | ORAL_TABLET | Freq: Three times a day (TID) | ORAL | 2 refills | Status: DC | PRN

## 2019-01-27 NOTE — Telephone Encounter (Signed)
I called the pharmacy there are no refills left on this prescription, looking at the notes it states patient reported not taking.  Can you refill ?

## 2019-04-01 ENCOUNTER — Telehealth: Payer: Self-pay | Admitting: Family Medicine

## 2019-04-01 NOTE — Telephone Encounter (Signed)
Requested medication (s) are due for refill today: yes  Requested medication (s) are on the active medication list: yes  Last refill: 01/27/2019  Future visit scheduled:yes  Notes to clinic:  refill cannot be delegated Patient requesting a month supply   Requested Prescriptions  Pending Prescriptions Disp Refills   cyclobenzaprine (FLEXERIL) 10 MG tablet 30 tablet 2    Sig: Take 1 tablet (10 mg total) by mouth 3 (three) times daily as needed for muscle spasms.      Not Delegated - Analgesics:  Muscle Relaxants Failed - 04/01/2019  1:38 PM      Failed - This refill cannot be delegated      Passed - Valid encounter within last 6 months    Recent Outpatient Visits           2 months ago Essential hypertension, benign   [facility], Lilia Argue, MD   5 months ago Essential hypertension, benign   [facility], Lilia Argue, MD   8 months ago Essential hypertension, benign   [facility], Lilia Argue, MD   8 months ago Screening for thyroid disorder   [facility] at [facility], Lilia Argue, MD       Future Appointments             In 3 months Bo Merino, MD [place]   In 3 months Rutherford Guys, MD Primary Care at [place], [facility]

## 2019-04-01 NOTE — Telephone Encounter (Signed)
Medication Refill - Medication: cyclobenzaprine 10 mg . Pts muscle relaxer  Has the patient contacted their pharmacy? No. Pt is requesting to have this filled for the full month. Please advise.  (Agent: If no, request that the patient contact the pharmacy for the refill.) (Agent: If yes, when and what did the pharmacy advise?)  Preferred Pharmacy (with phone number or street name):  [facility] [place], [place]  [place] [address]  Phone: [phone] Fax: [phone]  Open 24 hours     Agent: Please be advised that RX refills may take up to 3 business days. We ask that you follow-up with your pharmacy.

## 2019-04-01 NOTE — Telephone Encounter (Signed)
01/19/19 was the last time pt was seen.   Please advise on refill of flexeril.

## 2019-04-02 MED ORDER — CYCLOBENZAPRINE HCL 10 MG PO TABS: 10.0000 mg | ORAL_TABLET | Freq: Three times a day (TID) | ORAL | 2 refills | Status: DC | PRN

## 2019-05-12 ENCOUNTER — Telehealth: Payer: Self-pay | Admitting: Family Medicine

## 2019-05-12 ENCOUNTER — Other Ambulatory Visit: Payer: Self-pay | Admitting: Emergency Medicine

## 2019-05-12 DIAGNOSIS — I10 Essential (primary) hypertension: Secondary | ICD-10-CM

## 2019-05-12 DIAGNOSIS — E782 Mixed hyperlipidemia: Secondary | ICD-10-CM

## 2019-05-12 MED ORDER — LOSARTAN POTASSIUM 100 MG PO TABS: 100.0000 mg | ORAL_TABLET | Freq: Every day | ORAL | 1 refills | Status: DC

## 2019-05-12 MED ORDER — AMLODIPINE BESYLATE 10 MG PO TABS: 10.0000 mg | ORAL_TABLET | Freq: Every day | ORAL | 1 refills | Status: DC

## 2019-05-12 MED ORDER — METOPROLOL SUCCINATE ER 50 MG PO TB24: 50.0000 mg | ORAL_TABLET | Freq: Every day | ORAL | 1 refills | Status: DC

## 2019-05-12 NOTE — Telephone Encounter (Signed)
Rx has been sent  

## 2019-05-12 NOTE — Telephone Encounter (Signed)
Medication Refill - Medication: amLODipine (NORVASC) 10 MG tablet  losartan (COZAAR) 100 MG tablet   metoprolol succinate (TOPROL-XL) 50 MG 24 hr tablet   Has the patient contacted their pharmacy? No. (Agent: If no, request that the patient contact the pharmacy for the refill.) (Agent: If yes, when and what did the pharmacy advise?)  Preferred Pharmacy (with phone number or street name):  [facility] #[id number] - [place], [place] - [address] AT [place] [address] Phone:  [phone]  Fax:  [phone]       Agent: Please be advised that RX refills may take up to 3 business days. We ask that you follow-up with your pharmacy.

## 2019-05-14 ENCOUNTER — Telehealth: Payer: Self-pay | Admitting: Family Medicine

## 2019-05-14 NOTE — Telephone Encounter (Signed)
Pt requesting refill

## 2019-05-14 NOTE — Telephone Encounter (Signed)
What is the name of the medication?cyclobenzaprine (FLEXERIL) 10 MG tablet [[id number]]    Have you contacted your pharmacy to request a refill? No   Which pharmacy would you like this sent to?  [facility] #[id number] [place], [place] - [address] AT [facility] OF [address] [place] [address]  Phone:  [phone] Fax:  [phone]  DEA #:  [id number]   Patient notified that their request is being sent to the clinical staff for review and that they should receive a call once it is complete. If they do not receive a call within 72 hours they can check with their pharmacy or our office.      Pt requesting a 90 day supply , states that the 30 day supply is not enough    Please advise

## 2019-05-15 MED ORDER — CYCLOBENZAPRINE HCL 10 MG PO TABS: 10.0000 mg | ORAL_TABLET | Freq: Three times a day (TID) | ORAL | 2 refills | Status: DC | PRN

## 2019-06-19 ENCOUNTER — Other Ambulatory Visit: Payer: Self-pay | Admitting: Family Medicine

## 2019-06-19 NOTE — Telephone Encounter (Signed)
Patient is requesting a refill of the following medications: Requested Prescriptions   Pending Prescriptions Disp Refills  . cyclobenzaprine (FLEXERIL) 10 MG tablet [Pharmacy Med Name: CYCLOBENZAPRINE 10MG  TABLETS] 30 tablet 2    Sig: TAKE 1 TABLET(10 MG) BY MOUTH THREE TIMES DAILY AS NEEDED FOR MUSCLE SPASMS    Date of patient request: 06/19/2019 Last office visit: 01/19/2019 Date of last refill: 05/15/2019 Last refill amount: 30 tablets  Follow up time period per chart: Appoint,ment Scheduled.

## 2019-06-19 NOTE — Telephone Encounter (Signed)
Requested  medications are  due for refill today no  Requested medications are on the active medication list yes  Last refill 3/5  Future visit scheduled no  Notes to clinic not delegated

## 2019-06-23 ENCOUNTER — Telehealth: Payer: Self-pay | Admitting: Family Medicine

## 2019-06-30 ENCOUNTER — Ambulatory Visit: Payer: 59 | Admitting: Rheumatology

## 2019-07-14 ENCOUNTER — Other Ambulatory Visit: Payer: Self-pay | Admitting: Family Medicine

## 2019-07-14 NOTE — Telephone Encounter (Signed)
Requested medications are due for refill today?  Yes - Medication cannot be delegated.    Requested medications are on active medication list?  Yes  Last Refill:   06/19/2019  # 30 with 2 refills  Future visit scheduled? Yes in one week.    Notes to Clinic:  Medication cannot be delegated.

## 2019-07-20 ENCOUNTER — Ambulatory Visit: Payer: 59 | Admitting: Family Medicine

## 2019-07-27 ENCOUNTER — Ambulatory Visit (INDEPENDENT_AMBULATORY_CARE_PROVIDER_SITE_OTHER): Payer: 59 | Admitting: Family Medicine

## 2019-07-27 ENCOUNTER — Encounter: Payer: Self-pay | Admitting: Family Medicine

## 2019-07-27 ENCOUNTER — Other Ambulatory Visit: Payer: Self-pay

## 2019-07-27 VITALS — BP 122/84 | HR 75 | Temp 98.1°F | Ht 70.5 in | Wt 302.0 lb

## 2019-07-27 DIAGNOSIS — M791 Myalgia, unspecified site: Secondary | ICD-10-CM

## 2019-07-27 DIAGNOSIS — I10 Essential (primary) hypertension: Secondary | ICD-10-CM

## 2019-07-27 DIAGNOSIS — E119 Type 2 diabetes mellitus without complications: Secondary | ICD-10-CM | POA: Diagnosis not present

## 2019-07-27 DIAGNOSIS — E782 Mixed hyperlipidemia: Secondary | ICD-10-CM

## 2019-07-27 MED ORDER — AMLODIPINE BESYLATE 10 MG PO TABS: 10.0000 mg | ORAL_TABLET | Freq: Every day | ORAL | 5 refills | Status: DC

## 2019-07-27 MED ORDER — CYCLOBENZAPRINE HCL 10 MG PO TABS: 10.0000 mg | ORAL_TABLET | Freq: Three times a day (TID) | ORAL | 5 refills | Status: DC | PRN

## 2019-07-27 MED ORDER — METOPROLOL SUCCINATE ER 50 MG PO TB24: 50.0000 mg | ORAL_TABLET | Freq: Every day | ORAL | 5 refills | Status: DC

## 2019-07-27 MED ORDER — LOSARTAN POTASSIUM 100 MG PO TABS: 100.0000 mg | ORAL_TABLET | Freq: Every day | ORAL | 5 refills | Status: DC

## 2019-07-27 NOTE — Progress Notes (Signed)
4/19/20214:20 PM  Tracie [name] [birth year], 59 y.o., female [id number]  Chief Complaint  Patient presents with  . Hypertension    refill 30 days w/ refill  . R leg pain    pulled muscle follow up / refill medication, takes muscle relax tid    HPI:   Patient is a 59 y.o. female with past medical history significant for HTN, HLP, DM2, myalgia who presents today for routine followup  Last OV Oct 2020 - no changes She is overall doing well Has been working in weight loss, eating healthier Continues to be limited for exercise/walking due to chronic right thigh pain Takes flexeril scheduled which keeps the pain manageable She is requesting disability placard as not able to walks most parking lots without resting due to pain She otherwise is taking her meds as prescribed Denies any side effects   Lab Results  Component Value Date   HGBA1C 7.0 (H) 01/19/2019   HGBA1C 6.8 (H) 10/20/2018   Lab Results  Component Value Date   LDLCALC 93 01/19/2019   CREATININE 0.78 01/19/2019    Depression screen PHQ 2/9 07/27/2019 01/19/2019 10/20/2018  Decreased Interest 0 0 0  Down, Depressed, Hopeless 0 0 0  PHQ - 2 Score 0 0 0    Fall Risk  07/27/2019 01/19/2019 10/20/2018  Falls in the past year? 0 0 0  Number falls in past yr: 0 0 0  Injury with Fall? 0 0 0     Allergies  Allergen Reactions  . Codeine     Prior to Admission medications   Medication Sig Start Date End Date Taking? Authorizing Provider  amLODipine (NORVASC) 10 MG tablet Take 1 tablet (10 mg total) by mouth daily. 05/12/19  Yes Rutherford Guys, MD  cyclobenzaprine (FLEXERIL) 10 MG tablet TAKE 1 TABLET(10 MG) BY MOUTH THREE TIMES DAILY AS NEEDED FOR MUSCLE SPASMS 06/19/19  Yes Rutherford Guys, MD  losartan (COZAAR) 100 MG tablet Take 1 tablet (100 mg total) by mouth daily. 05/12/19  Yes Rutherford Guys, MD  metoprolol succinate (TOPROL-XL) 50 MG 24 hr tablet Take 1 tablet (50 mg total) by mouth daily. Take with  or immediately following a meal. 05/12/19  Yes Rutherford Guys, MD  naproxen sodium (ALEVE) 220 MG tablet Take 220 mg by mouth.   Yes [provider]    Past Medical History:  Diagnosis Date  . History of DVT (deep vein thrombosis) 2010  . Hyperlipidemia   . Hypertension     Past Surgical History:  Procedure Laterality Date  . ABDOMINAL HYSTERECTOMY    . FRACTURE SURGERY Left    hand  . KNEE ARTHROSCOPY Right 1990s    Social History   Tobacco Use  . Smoking status: Former Smoker    Packs/day: 1.00    Years: 20.00    Pack years: 20.00    Types: Cigarettes    Quit date: 2010    Years since quitting: 11.3  . Smokeless tobacco: Never Used  Substance Use Topics  . Alcohol use: Never    Family History  Problem Relation Age of Onset  . Hypertension Mother   . Heart attack Father   . Heart disease Brother   . Healthy Son   . Breast cancer Neg Hx     Review of Systems  Constitutional: Negative for chills and fever.  Respiratory: Negative for cough and shortness of breath.   Cardiovascular: Negative for chest pain, palpitations and leg swelling.  Gastrointestinal: Negative  for abdominal pain, nausea and vomiting.   Per hpi  OBJECTIVE:  Today's Vitals   07/27/19 1608  BP: 122/84  Pulse: 75  Temp: 98.1 F (36.7 C)  SpO2: 94%  Weight: (!) 302 lb (137 kg)  Height: 5' 10.5" (1.791 m)   Body mass index is 42.72 kg/m.  Wt Readings from Last 3 Encounters:  07/27/19 (!) 302 lb (137 kg)  01/19/19 (!) 309 lb (140.2 kg)  12/30/18 (!) 310 lb (140.6 kg)    Physical Exam Vitals and nursing note reviewed.  Constitutional:      Appearance: She is well-developed.  HENT:     Head: Normocephalic and atraumatic.     Mouth/Throat:     Pharynx: No oropharyngeal exudate.  Eyes:     General: No scleral icterus.    Conjunctiva/sclera: Conjunctivae normal.     Pupils: Pupils are equal, round, and reactive to light.  Cardiovascular:     Rate and Rhythm: Normal  rate and regular rhythm.     Heart sounds: Normal heart sounds. No murmur. No friction rub. No gallop.   Pulmonary:     Effort: Pulmonary effort is normal.     Breath sounds: Normal breath sounds. No wheezing or rales.  Musculoskeletal:     Cervical back: Neck supple.  Skin:    General: Skin is warm and dry.  Neurological:     Mental Status: She is alert and oriented to person, place, and time.     No results found for this or any previous visit (from the past 24 hour(s)).  No results found.   ASSESSMENT and PLAN  1. Essential hypertension, benign Controlled. Continue current regime.  - amLODipine (NORVASC) 10 MG tablet; Take 1 tablet (10 mg total) by mouth daily. - metoprolol succinate (TOPROL-XL) 50 MG 24 hr tablet; Take 1 tablet (50 mg total) by mouth daily. Take with or immediately following a meal. - losartan (COZAAR) 100 MG tablet; Take 1 tablet (100 mg total) by mouth daily.  2. Mixed hyperlipidemia Checking labs today, medications will be adjusted as needed.  - CMP14+EGFR - Lipid panel  3. Type 2 diabetes mellitus without complication, without long-term current use of insulin ([place]) Checking labs today, medications will be started as needed. Cont with LFM - Hemoglobin A1c  4. Myalgia Stable. Cont current mgt  Other orders - naproxen sodium (ALEVE) 220 MG tablet; Take 220 mg by mouth. - cyclobenzaprine (FLEXERIL) 10 MG tablet; Take 1 tablet (10 mg total) by mouth 3 (three) times daily as needed for muscle spasms.  Return in about 6 months (around 01/26/2020).    Rutherford Guys, MD Primary Care at [place], [place] [address] Ph.  [phone] Fax [phone]

## 2019-07-27 NOTE — Patient Instructions (Signed)
° ° ° °  If you have lab work done today you will be contacted with your lab results within the next 2 weeks.  If you have not heard from us then please contact us. The fastest way to get your results is to register for My Chart. ° ° °IF you received an x-ray today, you will receive an invoice from [facility]. Please contact [facility] at [phone] with questions or concerns regarding your invoice.  ° °IF you received labwork today, you will receive an invoice from [facility]. Please contact [facility] at [phone] with questions or concerns regarding your invoice.  ° °Our billing staff will not be able to assist you with questions regarding bills from these companies. ° °You will be contacted with the lab results as soon as they are available. The fastest way to get your results is to activate your My Chart account. Instructions are located on the last page of this paperwork. If you have not heard from us regarding the results in 2 weeks, please contact this office. °  ° ° ° °

## 2019-07-28 ENCOUNTER — Encounter: Payer: Self-pay | Admitting: Radiology

## 2019-07-28 LAB — CMP14+EGFR
ALT: 11 IU/L (ref 0–32)
AST: 14 IU/L (ref 0–40)
Albumin/Globulin Ratio: 1.4 (ref 1.2–2.2)
Albumin: 3.9 g/dL (ref 3.8–4.9)
Alkaline Phosphatase: 83 IU/L (ref 39–117)
BUN/Creatinine Ratio: 14 (ref 12–28)
BUN: 10 mg/dL (ref 8–27)
Bilirubin Total: 0.7 mg/dL (ref 0.0–1.2)
CO2: 24 mmol/L (ref 20–29)
Calcium: 9.1 mg/dL (ref 8.7–10.3)
Chloride: 101 mmol/L (ref 96–106)
Creatinine, Ser: 0.74 mg/dL (ref 0.57–1.00)
GFR calc Af Amer: 102 mL/min/{1.73_m2} (ref >59)
GFR calc non Af Amer: 88 mL/min/{1.73_m2} (ref >59)
Globulin, Total: 2.8 g/dL (ref 1.5–4.5)
Glucose: 180 mg/dL — ABNORMAL HIGH (ref 65–99)
Potassium: 3.9 mmol/L (ref 3.5–5.2)
Sodium: 140 mmol/L (ref 134–144)
Total Protein: 6.7 g/dL (ref 6.0–8.5)

## 2019-07-28 LAB — LIPID PANEL
Chol/HDL Ratio: 3.3 ratio (ref 0.0–4.4)
Cholesterol, Total: 149 mg/dL (ref 100–199)
HDL: 45 mg/dL (ref >39)
LDL Chol Calc (NIH): 90 mg/dL (ref 0–99)
Triglycerides: 72 mg/dL (ref 0–149)
VLDL Cholesterol Cal: 14 mg/dL (ref 5–40)

## 2019-07-28 LAB — HEMOGLOBIN A1C
Est. average glucose Bld gHb Est-mCnc: 151 mg/dL
Hgb A1c MFr Bld: 6.9 % — ABNORMAL HIGH (ref 4.8–5.6)

## 2019-11-15 ENCOUNTER — Other Ambulatory Visit: Payer: Self-pay | Admitting: Family Medicine

## 2019-11-15 DIAGNOSIS — E782 Mixed hyperlipidemia: Secondary | ICD-10-CM

## 2019-11-15 DIAGNOSIS — I10 Essential (primary) hypertension: Secondary | ICD-10-CM

## 2019-11-15 NOTE — Telephone Encounter (Signed)
Requested Prescriptions  Pending Prescriptions Disp Refills  . metoprolol succinate (TOPROL-XL) 50 MG 24 hr tablet [Pharmacy Med Name: METOPROLOL ER SUCCINATE 50MG  TABS] 90 tablet 0    Sig: TAKE 1 TABLET(50 MG) BY MOUTH DAILY WITH OR IMMEDIATELY FOLLOWING A MEAL     Cardiovascular:  Beta Blockers Passed - 11/15/2019  9:02 AM      Passed - Last BP in normal range    BP Readings from Last 1 Encounters:  07/27/19 122/84         Passed - Last Heart Rate in normal range    Pulse Readings from Last 1 Encounters:  07/27/19 75         Passed - Valid encounter within last 6 months    Recent Outpatient Visits          3 months ago Essential hypertension, benign   Primary Care at [facility], One[facility], MD   10 months ago Essential hypertension, benign   [facility] at [facility], Oneita Jolly, MD   1 year ago Essential hypertension, benign   [facility] at [facility], Oneita Jolly, MD   1 year ago Essential hypertension, benign   [facility] at [facility], Oneita Jolly, MD   1 year ago Screening for thyroid disorder   [facility] at [facility], Oneita Jolly, MD      Future Appointments            In 2 months [facility], MD Primary Care at [facility], [facility]           . losartan (COZAAR) 100 MG tablet [Pharmacy Med Name: LOSARTAN 100MG  TABLETS] 90 tablet 0    Sig: TAKE 1 TABLET(100 MG) BY MOUTH DAILY     Cardiovascular:  Angiotensin Receptor Blockers Passed - 11/15/2019  9:02 AM      Passed - Cr in normal range and within 180 days    Creatinine, Ser  Date Value Ref Range Status  07/27/2019 0.74 0.57 - 1.00 mg/dL Final         Passed - K in normal range and within 180 days    Potassium  Date Value Ref Range Status  07/27/2019 3.9 3.5 - 5.2 mmol/L Final         Passed - Patient is not pregnant      Passed - Last BP in normal range    BP Readings from Last 1 Encounters:  07/27/19 122/84         Passed - Valid encounter within last 6 months    Recent Outpatient Visits           3 months ago Essential hypertension, benign   Primary Care at 07/29/2019, 07/29/19, MD   10 months ago Essential hypertension, benign   Primary Care at [facility], [facility], MD   1 year ago Essential hypertension, benign   Primary Care at [facility], [facility], MD   1 year ago Essential hypertension, benign   Primary Care at [facility], [facility], MD   1 year ago Screening for thyroid disorder   Primary Care at [facility], [facility], MD      Future Appointments            In 2 months Oneita Jolly, MD Primary Care at [place], [facility]

## 2019-11-26 ENCOUNTER — Encounter: Payer: Self-pay | Admitting: Family Medicine

## 2019-12-24 ENCOUNTER — Encounter: Payer: Self-pay | Admitting: Family Medicine

## 2020-01-15 ENCOUNTER — Other Ambulatory Visit: Payer: Self-pay | Admitting: Family Medicine

## 2020-01-15 NOTE — Telephone Encounter (Signed)
Requested medication (s) are due for refill today: yes  Requested medication (s) are on the active medication list: yes  Last refill:  12/23/19  Future visit scheduled: yes  Notes to clinic: not delegated    Requested Prescriptions  Pending Prescriptions Disp Refills   cyclobenzaprine (FLEXERIL) 10 MG tablet [Pharmacy Med Name: CYCLOBENZAPRINE 10MG  TABLETS] 90 tablet 5    Sig: TAKE 1 TABLET(10 MG) BY MOUTH THREE TIMES DAILY AS NEEDED FOR MUSCLE SPASMS      Not Delegated - Analgesics:  Muscle Relaxants Failed - 01/15/2020 11:07 AM      Failed - This refill cannot be delegated      Passed - Valid encounter within last 6 months    Recent Outpatient Visits           5 months ago Essential hypertension, benign   Primary Care at 03/16/2020, Oneita Jolly, MD   12 months ago Essential hypertension, benign   [facility] at [facility], Oneita Jolly, MD   1 year ago Essential hypertension, benign   [facility] at [facility], Oneita Jolly, MD   1 year ago Essential hypertension, benign   [facility] at [facility], Oneita Jolly, MD   1 year ago Screening for thyroid disorder   [facility] Care at [facility], Oneita Jolly, MD       Future Appointments             In 1 month [facility] Neva Seat, MD Primary Care at [place], [facility]

## 2020-01-15 NOTE — Telephone Encounter (Signed)
Any further refills will need to come from patients new provider

## 2020-01-25 ENCOUNTER — Ambulatory Visit: Payer: 59 | Admitting: Family Medicine

## 2020-02-26 ENCOUNTER — Other Ambulatory Visit: Payer: Self-pay

## 2020-02-26 ENCOUNTER — Encounter: Payer: Self-pay | Admitting: Family Medicine

## 2020-02-26 ENCOUNTER — Telehealth: Payer: Self-pay | Admitting: Cardiology

## 2020-02-26 ENCOUNTER — Ambulatory Visit: Payer: 59 | Admitting: Family Medicine

## 2020-02-26 VITALS — BP 116/80 | HR 86 | Temp 97.3°F | Ht 70.5 in | Wt 313.0 lb

## 2020-02-26 DIAGNOSIS — I4891 Unspecified atrial fibrillation: Secondary | ICD-10-CM | POA: Diagnosis not present

## 2020-02-26 DIAGNOSIS — I499 Cardiac arrhythmia, unspecified: Secondary | ICD-10-CM

## 2020-02-26 DIAGNOSIS — E782 Mixed hyperlipidemia: Secondary | ICD-10-CM | POA: Diagnosis not present

## 2020-02-26 DIAGNOSIS — E669 Obesity, unspecified: Secondary | ICD-10-CM

## 2020-02-26 DIAGNOSIS — E1169 Type 2 diabetes mellitus with other specified complication: Secondary | ICD-10-CM | POA: Diagnosis not present

## 2020-02-26 DIAGNOSIS — I10 Essential (primary) hypertension: Secondary | ICD-10-CM

## 2020-02-26 DIAGNOSIS — R000 Tachycardia, unspecified: Secondary | ICD-10-CM

## 2020-02-26 MED ORDER — METOPROLOL SUCCINATE ER 50 MG PO TB24: ORAL_TABLET | ORAL | 1 refills | Status: DC

## 2020-02-26 MED ORDER — APIXABAN 5 MG PO TABS: 5.0000 mg | ORAL_TABLET | Freq: Two times a day (BID) | ORAL | 1 refills | Status: DC

## 2020-02-26 MED ORDER — AMLODIPINE BESYLATE 10 MG PO TABS: 10.0000 mg | ORAL_TABLET | Freq: Every day | ORAL | 1 refills | Status: DC

## 2020-02-26 MED ORDER — CYCLOBENZAPRINE HCL 10 MG PO TABS: ORAL_TABLET | ORAL | 3 refills | Status: DC

## 2020-02-26 MED ORDER — METOPROLOL TARTRATE 50 MG PO TABS: 50.0000 mg | ORAL_TABLET | Freq: Two times a day (BID) | ORAL | 1 refills | Status: DC

## 2020-02-26 MED ORDER — LOSARTAN POTASSIUM 100 MG PO TABS: ORAL_TABLET | ORAL | 1 refills | Status: DC

## 2020-02-26 NOTE — Telephone Encounter (Signed)
I had a call after 5:00 from Dr. Chilton Si.  This patient is at his office with atrial fibrillation with rates in the 120s.  She has some dyspnea but is in no distress.  He thinks this can be handled as an outpatient with a new patient appointment next week.  She is on metoprolol 50 and has blood pressure to increase that to Toprol-XL 50 twice daily.  He is going to start her on Eliquis.  She is going to get labs to include TSH CBC and BUN.  We will check a BNP level.  We will get her in early next week to be seen.  She understand that she needs to go to the emergency room with any increasing shortness of breath or if her heart rate is not well controlled.

## 2020-02-26 NOTE — Patient Instructions (Addendum)
Unfortunately you do have atrial fibrillation that was seen on your EKG today.  Because the symptoms have been present for a while, I think we can initially treat this outpatient with close cardiology follow-up next week.   I did change one of your blood pressure medications metoprolol to 1 pill twice per day instead of the extended release once per day.   You will also be started on a blood thinner to help minimize the risk of stroke from atrial fibrillation.  That medicine is called Eliquis and will take it twice per day.  If any new bleeding on that medication be seen right away.  If it anytime you feel worsening shortness of breath, lightheadedness, dizziness, chest pain or any new or worsening symptoms proceed directly to the emergency room or call 911.   You have an appointment with cardiology on Monday 02/29/20 at 1:40pm with Dr Margaretann Loveless.  [place]  Call their office if questions [phone]   Atrial Fibrillation  Atrial fibrillation is a type of irregular or rapid heartbeat (arrhythmia). In atrial fibrillation, the top part of the heart (atria) beats in an irregular pattern. This makes the heart unable to pump blood normally and effectively. The goal of treatment is to prevent blood clots from forming, control your heart rate, or restore your heartbeat to a normal rhythm. If this condition is not treated, it can cause serious problems, such as a weakened heart muscle (cardiomyopathy) or a stroke. What are the causes? This condition is often caused by medical conditions that damage the heart's electrical system. These include:  High blood pressure (hypertension). This is the most common cause.  Certain heart problems or conditions, such as heart failure, coronary artery disease, heart valve problems, or heart surgery.  Diabetes.  Overactive thyroid (hyperthyroidism).  Obesity.  Chronic kidney disease. In some cases, the cause of this condition is not  known. What increases the risk? This condition is more likely to develop in:  Older people.  People who smoke.  Athletes who do endurance exercise.  People who have a family history of atrial fibrillation.  Men.  People who use drugs.  People who drink a lot of alcohol.  People who have lung conditions, such as emphysema, pneumonia, or COPD.  People who have obstructive sleep apnea. What are the signs or symptoms? Symptoms of this condition include:  A feeling that your heart is racing or beating irregularly.  Discomfort or pain in your chest.  Shortness of breath.  Sudden light-headedness or weakness.  Tiring easily during exercise or activity.  Fatigue.  Syncope (fainting).  Sweating. In some cases, there are no symptoms. How is this diagnosed? Your health care provider may detect atrial fibrillation when taking your pulse. If detected, this condition may be diagnosed with:  An electrocardiogram (ECG) to check electrical signals of the heart.  An ambulatory cardiac monitor to record your heart's activity for a few days.  A transthoracic echocardiogram (TTE) to create pictures of your heart.  A transesophageal echocardiogram (TEE) to create even closer pictures of your heart.  A stress test to check your blood supply while you exercise.  Imaging tests, such as a CT scan or chest X-ray.  Blood tests. How is this treated? Treatment depends on underlying conditions and how you feel when you experience atrial fibrillation. This condition may be treated with:  Medicines to prevent blood clots or to treat heart rate or heart rhythm problems.  Electrical cardioversion to reset the  heart's rhythm.  A pacemaker to correct abnormal heart rhythm.  Ablation to remove the heart tissue that sends abnormal signals.  Left atrial appendage closure to seal the area where blood clots can form. In some cases, underlying conditions will be treated. Follow these  instructions at home: Medicines  Take over-the counter and prescription medicines only as told by your health care provider.  Do not take any new medicines without talking to your health care provider.  If you are taking blood thinners: ? Talk with your health care provider before you take any medicines that contain aspirin or NSAIDs, such as ibuprofen. These medicines increase your risk for dangerous bleeding. ? Take your medicine exactly as told, at the same time every day. ? Avoid activities that could cause injury or bruising, and follow instructions about how to prevent falls. ? Wear a medical alert bracelet or carry a card that lists what medicines you take. Lifestyle      Do not use any products that contain nicotine or tobacco, such as cigarettes, e-cigarettes, and chewing tobacco. If you need help quitting, ask your health care provider.  Eat heart-healthy foods. Talk with a dietitian to make an eating plan that is right for you.  Exercise regularly as told by your health care provider.  Do not drink alcohol.  Lose weight if you are overweight.  Do not use drugs, including cannabis. General instructions  If you have obstructive sleep apnea, manage your condition as told by your health care provider.  Do not use diet pills unless your health care provider approves. Diet pills can make heart problems worse.  Keep all follow-up visits as told by your health care provider. This is important. Contact a health care provider if you:  Notice a change in the rate, rhythm, or strength of your heartbeat.  Are taking a blood thinner and you notice more bruising.  Tire more easily when you exercise or do heavy work.  Have a sudden change in weight. Get help right away if you have:   Chest pain, abdominal pain, sweating, or weakness.  Trouble breathing.  Side effects of blood thinners, such as blood in your vomit, stool, or urine, or bleeding that cannot stop.  Any  symptoms of a stroke. "BE FAST" is an easy way to remember the main warning signs of a stroke: ? B - Balance. Signs are dizziness, sudden trouble walking, or loss of balance. ? E - Eyes. Signs are trouble seeing or a sudden change in vision. ? F - Face. Signs are sudden weakness or numbness of the face, or the face or eyelid drooping on one side. ? A - Arms. Signs are weakness or numbness in an arm. This happens suddenly and usually on one side of the body. ? S - Speech. Signs are sudden trouble speaking, slurred speech, or trouble understanding what people say. ? T - Time. Time to call emergency services. Write down what time symptoms started.  Other signs of a stroke, such as: ? A sudden, severe headache with no known cause. ? Nausea or vomiting. ? Seizure. These symptoms may represent a serious problem that is an emergency. Do not wait to see if the symptoms will go away. Get medical help right away. Call your local emergency services (911 in the [place] Do not drive yourself to the hospital. Summary  Atrial fibrillation is a type of irregular or rapid heartbeat (arrhythmia).  Symptoms include a feeling that your heart is beating fast or irregularly.  You may be given medicines to prevent blood clots or to treat heart rate or heart rhythm problems.  Get help right away if you have signs or symptoms of a stroke.  Get help right away if you cannot catch your breath or have chest pain or pressure. This information is not intended to replace advice given to you by your health care provider. Make sure you discuss any questions you have with your health care provider. Document Revised: 09/17/2018 Document Reviewed: 09/17/2018 Elsevier Patient Education  [place] watching diet. I will check labs today.  I do recommend eye specialist once per year.      Type 2 Diabetes Mellitus, Self Care, Adult When you have type 2 diabetes (type 2 diabetes mellitus), you must  make sure your blood sugar (glucose) stays in a healthy range. You can do this with:  Nutrition.  Exercise.  Lifestyle changes.  Medicines or insulin, if needed.  Support from your doctors and others. How to stay aware of blood sugar   Check your blood sugar level every day, as often as told.  Have your A1c (hemoglobin A1c) level checked two or more times a year. Have it checked more often if your doctor tells you to. Your doctor will set personal treatment goals for you. Generally, you should have these blood sugar levels:  Before meals (preprandial): 80-130 mg/dL (4.4-7.2 mmol/L).  After meals (postprandial): below 180 mg/dL (10 mmol/L).  A1c level: less than 7%. How to manage high and low blood sugar Signs of high blood sugar High blood sugar is called hyperglycemia. Know the signs of high blood sugar. Signs may include:  Feeling: ? Thirsty. ? Hungry. ? Very tired.  Needing to pee (urinate) more than usual.  Blurry vision. Signs of low blood sugar Low blood sugar is called hypoglycemia. This is when blood sugar is at or below 70 mg/dL (3.9 mmol/L). Signs may include:  Feeling: ? Hungry. ? Worried or nervous (anxious). ? Sweaty and clammy. ? Confused. ? Dizzy. ? Sleepy. ? Sick to your stomach (nauseous).  Having: ? A fast heartbeat. ? A headache. ? A change in your vision. ? Jerky movements that you cannot control (seizure). ? Tingling or no feeling (numbness) around your mouth, lips, or tongue.  Having trouble with: ? Moving (coordination). ? Sleeping. ? Passing out (fainting). ? Getting upset easily (irritability). Treating low blood sugar To treat low blood sugar, eat or drink something sugary right away. If you can think clearly and swallow safely, follow the 15:15 rule:  Take 15 grams of a fast-acting carb (carbohydrate). Talk with your doctor about how much you should take.  Some fast-acting carbs are: ? Sugar tablets (glucose pills). Take 3-4  pills. ? 6-8 pieces of hard candy. ? 4-6 oz (120-150 mL) of fruit juice. ? 4-6 oz (120-150 mL) of regular (not diet) soda. ? 1 Tbsp (15 mL) honey or sugar.  Check your blood sugar 15 minutes after you take the carb.  If your blood sugar is still at or below 70 mg/dL (3.9 mmol/L), take 15 grams of a carb again.  If your blood sugar does not go above 70 mg/dL (3.9 mmol/L) after 3 tries, get help right away.  After your blood sugar goes back to normal, eat a meal or a snack within 1 hour. Treating very low blood sugar If your blood sugar is at or below 54 mg/dL (3 mmol/L), you have very low blood sugar (severe  hypoglycemia). This is an emergency. Do not wait to see if the symptoms will go away. Get medical help right away. Call your local emergency services (911 in the [place] If you have very low blood sugar and you cannot eat or drink, you may need a glucagon shot (injection). A family member or friend should learn how to check your blood sugar and how to give you a glucagon shot. Ask your doctor if you need to have a glucagon shot kit at home. Follow these instructions at home: Medicine  Take insulin and diabetes medicines as told.  If your doctor says you should take more or less insulin and medicines, do this exactly as told.  Do not run out of insulin or medicines. Having diabetes can raise your risk for other long-term conditions. These include heart disease and kidney disease. Your doctor may prescribe medicines to help you not have these problems. Food   Make healthy food choices. These include: ? Chicken, fish, egg whites, and beans. ? Oats, whole wheat, bulgur, brown rice, quinoa, and millet. ? Fresh fruits and vegetables. ? Low-fat dairy products. ? Nuts, avocado, olive oil, and canola oil.  Meet with a food specialist (dietitian). He or she can help you make an eating plan that is right for you.  Follow instructions from your doctor about what you cannot eat or  drink.  Drink enough fluid to keep your pee (urine) pale yellow.  Keep track of carbs that you eat. Do this by reading food labels and learning food serving sizes.  Follow your sick day plan when you cannot eat or drink normally. Make this plan with your doctor so it is ready to use. Activity  Exercise 3 or more times a week.  Do not go more than 2 days without exercising.  Talk with your doctor before you start a new exercise. Your doctor may need to tell you to change: ? How much insulin or medicines you take. ? How much food you eat. Lifestyle  Do not use any tobacco products. These include cigarettes, chewing tobacco, and e-cigarettes. If you need help quitting, ask your doctor.  Ask your doctor how much alcohol is safe for you.  Learn to deal with stress. If you need help with this, ask your doctor. Body care   Stay up to date with your shots (immunizations).  Have your eyes and feet checked by a doctor as often as told.  Check your skin and feet every day. Check for cuts, bruises, redness, blisters, or sores.  Brush your teeth and gums two times a day. Floss one or more times a day.  Go to the dentist one or more times every 6 months.  Stay at a healthy weight. General instructions  Take over-the-counter and prescription medicines only as told by your doctor.  Share your diabetes care plan with: ? Your work or school. ? People you live with.  Carry a card or wear jewelry that says you have diabetes.  Keep all follow-up visits as told by your doctor. This is important. Questions to ask your doctor  Do I need to meet with a diabetes educator?  Where can I find a support group for people with diabetes? Where to find more information To learn more about diabetes, visit:  American Diabetes Association: www.diabetes.org  American Association of Diabetes Educators: www.diabeteseducator.org Summary  When you have type 2 diabetes, you must make sure your  blood sugar (glucose) stays in a healthy range.  Check your  blood sugar every day, as often as told.  Having diabetes can raise your risk for other conditions. Your doctor may prescribe medicines to help you not have these problems.  Keep all follow-up visits as told by your doctor. This is important. This information is not intended to replace advice given to you by your health care provider. Make sure you discuss any questions you have with your health care provider. Document Revised: 09/16/2017 Document Reviewed: 04/29/2015 Elsevier Patient Education  [employer].    If you have lab work done today you will be contacted with your lab results within the next 2 weeks.  If you have not heard from [place] then please contact us. The fastest way to get your results is to register for My Chart.   IF you received an x-ray today, you will receive an invoice from [facility]. Please contact [facility] at [phone] with questions or concerns regarding your invoice.   IF you received labwork today, you will receive an invoice from [facility]. Please contact [facility] at [phone] with questions or concerns regarding your invoice.   Our billing staff will not be able to assist you with questions regarding bills from these companies.  You will be contacted with the lab results as soon as they are available. The fastest way to get your results is to activate your My Chart account. Instructions are located on the last page of this paperwork. If you have not heard from [place] regarding the results in 2 weeks, please contact this office.

## 2020-02-26 NOTE — Progress Notes (Signed)
Subjective:  Patient ID: Tracie [name], female    DOB: 09/04/1960  Age: 59 y.o. MRN: [id number]  CC:  Chief Complaint  Patient presents with   Establish Care    Pt reports she feels well with no complaints.    HPI Tracie [name] presents for   Transfer of care, previous primary care provider Dr. Pamella Pert.  Last visit in April.  History of hypertension, hyperlipidemia, diabetes, chronic R leg myalgia treated with Flexeril, previously  Diabetes: Associated with obesity. Diet/exercise approach.  She is on ARB.  Weight is up 11 pounds since April.  Exercise limited due to chronic right thigh pain.  Treated with Flexeril that has kept pain manageable, no daytime somnolence.   Occasional alleve for knee pain - every few days. Not daily. Up and down work in past with strained R quad muscle.  Disability placard completed previously. No soda/sweet tea, fast food once per week.  Restaurant/take out - minimal.   Wt Readings from Last 3 Encounters:  02/26/20 (!) 313 lb (142 kg)  07/27/19 (!) 302 lb (137 kg)  01/19/19 (!) 309 lb (140.2 kg)  Microalbumin: due Optho, foot exam, pneumovax:  Ophthalmology: no recent visit.   Diabetic Foot Exam - Simple   No data filed     Lab Results  Component Value Date   HGBA1C 6.9 (H) 07/27/2019   HGBA1C 7.0 (H) 01/19/2019   HGBA1C 6.8 (H) 10/20/2018   Lab Results  Component Value Date   LDLCALC 90 07/27/2019   CREATININE 0.74 07/27/2019   Hypertension: Losartan 100 mg daily, Toprol 50 mg daily, amlodipine 10 mg daily. no missed dose, no new side effects with meds.  Palpitations - heart beats fast - like coming out of chest with walking, out of breath at times with these symptoms - longstanding symptoms. For years. No chest pain. Has to relax and improves.  No PND, No orthopnea - 1 pillow  Home readings: none  BP Readings from Last 3 Encounters:  02/26/20 116/80  07/27/19 122/84  01/19/19 124/83   Lab Results  Component  Value Date   CREATININE 0.74 07/27/2019   Lab Results  Component Value Date   TSH 3.79 12/03/2018    Hyperlipidemia: Overall stable in April, off meds. None in past. Lab Results  Component Value Date   CHOL 149 07/27/2019   HDL 45 07/27/2019   LDLCALC 90 07/27/2019   TRIG 72 07/27/2019   CHOLHDL 3.3 07/27/2019   Lab Results  Component Value Date   ALT 11 07/27/2019   AST 14 07/27/2019   ALKPHOS 83 07/27/2019   BILITOT 0.7 07/27/2019       History Patient Active Problem List   Diagnosis Date Noted   Myalgia 10/27/2018   Arthralgia of knee, right 10/27/2018   Varicose veins of both lower extremities with pain 07/16/2018   Hyperlipidemia 06/03/2018   Essential hypertension, benign 06/03/2018   Past Medical History:  Diagnosis Date   History of DVT (deep vein thrombosis) 2010   Hyperlipidemia    Hypertension    Past Surgical History:  Procedure Laterality Date   ABDOMINAL HYSTERECTOMY     FRACTURE SURGERY Left    hand   KNEE ARTHROSCOPY Right 1990s   Allergies  Allergen Reactions   Codeine    Prior to Admission medications   Medication Sig Start Date End Date Taking? Authorizing Provider  amLODipine (NORVASC) 10 MG tablet Take 1 tablet (10 mg total) by mouth daily. 07/27/19  Yes Pamella Pert,  Lilia Argue, MD  cyclobenzaprine (FLEXERIL) 10 MG tablet TAKE 1 TABLET(10 MG) BY MOUTH THREE TIMES DAILY AS NEEDED FOR MUSCLE SPASMS 01/15/20  Yes Rutherford Guys, MD  losartan (COZAAR) 100 MG tablet TAKE 1 TABLET(100 MG) BY MOUTH DAILY 11/15/19  Yes Rutherford Guys, MD  metoprolol succinate (TOPROL-XL) 50 MG 24 hr tablet TAKE 1 TABLET(50 MG) BY MOUTH DAILY WITH OR IMMEDIATELY FOLLOWING A MEAL 11/15/19  Yes Rutherford Guys, MD  naproxen sodium (ALEVE) 220 MG tablet Take 220 mg by mouth.   Yes [provider]   Social History   Socioeconomic History   Marital status: Married    Spouse name: Not on file   Number of children: Not on file   Years of  education: Not on file   Highest education level: Not on file  Occupational History   Not on file  Tobacco Use   Smoking status: Former Smoker    Packs/day: 1.00    Years: 20.00    Pack years: 20.00    Types: Cigarettes    Quit date: 2010    Years since quitting: 11.8   Smokeless tobacco: Never Used  Scientific laboratory technician Use: Never used  Substance and Sexual Activity   Alcohol use: Never   Drug use: Not Currently   Sexual activity: Yes  Other Topics Concern   Not on file  Social History Narrative   Not on file   Social Determinants of Health   Financial Resource Strain:    Difficulty of Paying Living Expenses: Not on file  Food Insecurity:    Worried About Charity fundraiser in the Last Year: Not on file   [employer] of Food in the Last Year: Not on file  Transportation Needs:    Lack of Transportation (Medical): Not on file   Lack of Transportation (Non-Medical): Not on file  Physical Activity:    Days of Exercise per Week: Not on file   Minutes of Exercise per Session: Not on file  Stress:    Feeling of Stress : Not on file  Social Connections:    Frequency of Communication with Friends and Family: Not on file   Frequency of Social Gatherings with Friends and Family: Not on file   Attends Religious Services: Not on file   Active Member of Clubs or Organizations: Not on file   Attends Archivist Meetings: Not on file   Marital Status: Not on file  Intimate Partner Violence:    Fear of Current or Ex-Partner: Not on file   Emotionally Abused: Not on file   Physically Abused: Not on file   Sexually Abused: Not on file    Review of Systems Per HPI.   Objective:   Vitals:   02/26/20 1456  BP: 116/80  Pulse: 86  Temp: (!) 97.3 F (36.3 C)  TempSrc: Temporal  SpO2: 95%  Weight: (!) 313 lb (142 kg)  Height: 5' 10.5" (1.791 m)     Physical Exam Vitals reviewed.  Constitutional:      Appearance: She is obese.  Neck:      Comments: No JVD.  Cardiovascular:     Rate and Rhythm: Tachycardia present. Rhythm irregular.     Pulses: Normal pulses.     Heart sounds: No murmur heard.      Comments: Tachy, irregular/irregular rhythm.  Pulmonary:     Effort: Pulmonary effort is normal. No respiratory distress.     Breath sounds: Normal breath  sounds. No wheezing or rales.  Musculoskeletal:     Right lower leg: Edema present.     Left lower leg: Edema (trace nonpitting at ankles. ) present.    Initial HR on 124 on triage, improved to above with rest.   EKG atrial fibrillation with rate 123.  No prior EKG available for review.  CHA2DS2vasc score: 3 (htn, DM, female).  No hx of PUD/bleeding. No ASA.   10:33 PM Spoke with cardiology, given stability of symptoms will initially treat outpatient with anticoagulation, increase metoprolol to twice daily at 50 mg.  ER/911 precautions given to patient, close cardiology follow-up planned next week.  approx 50 minutes spent during visit, greater than 50% counseling and assimilation of information, chart review, and discussion of plan.    Assessment & Plan:  Tracie [name] is a 59 y.o. female . Atrial fibrillation, unspecified type ([place]) - Plan: Ambulatory referral to Cardiology, CBC, Pro b natriuretic peptide, metoprolol tartrate (LOPRESSOR) 50 MG tablet, apixaban (ELIQUIS) 5 MG TABS tablet Irregularly irregular heart rhythm - Plan: EKG 12-Lead, TSH Tachycardia - Plan: TSH  - new diagnosis afib with  longstanding DOE without recent changes. Discussed with cardiology   - increase metoprolol to 567m BID, start eliquis for anticoagulation, bleeding and orthostatic precautions, ER precautions if any new or worsening symptoms. Cardiology appt in 3 days.   Mixed hyperlipidemia - Plan: losartan (COZAAR) 100 MG tablet, Comprehensive metabolic panel, Lipid panel  - check labs. Statin recommendation with diabetes discussed. Declined at present.   Essential  hypertension, benign - Plan: amLODipine (NORVASC) 10 MG tablet, Comprehensive metabolic panel, DISCONTINUED: metoprolol succinate (TOPROL-XL) 50 MG 24 hr tablet  -stable. Increased metoprolol as above for Afib.   Type 2 diabetes mellitus [place] - Plan: Microalbumin / creatinine urine ratio, Hemoglobin A1c  - check A1c. Continue diet approach at this time.     Meds ordered this encounter  Medications   losartan (COZAAR) 100 MG tablet    Sig: TAKE 1 TABLET(100 MG) BY MOUTH DAILY    Dispense:  90 tablet    Refill:  1   cyclobenzaprine (FLEXERIL) 10 MG tablet    Sig: TAKE 1 TABLET(10 MG) BY MOUTH THREE TIMES DAILY AS NEEDED FOR MUSCLE SPASMS    Dispense:  90 tablet    Refill:  3   DISCONTD: metoprolol succinate (TOPROL-XL) 50 MG 24 hr tablet    Sig: TAKE 1 TABLET(50 MG) BY MOUTH DAILY WITH OR IMMEDIATELY FOLLOWING A MEAL    Dispense:  90 tablet    Refill:  1   amLODipine (NORVASC) 10 MG tablet    Sig: Take 1 tablet (10 mg total) by mouth daily.    Dispense:  90 tablet    Refill:  1   metoprolol tartrate (LOPRESSOR) 50 MG tablet    Sig: Take 1 tablet (50 mg total) by mouth 2 (two) times daily.    Dispense:  60 tablet    Refill:  1   apixaban (ELIQUIS) 5 MG TABS tablet    Sig: Take 1 tablet (5 mg total) by mouth 2 (two) times daily.    Dispense:  60 tablet    Refill:  1   Patient Instructions   Unfortunately you do have atrial fibrillation that was seen on your EKG today.  Because the symptoms have been present for a while, I think we can initially treat this outpatient with close cardiology follow-up next week.   I did change one of your blood  pressure medications metoprolol to 1 pill twice per day instead of the extended release once per day.   You will also be started on a blood thinner to help minimize the risk of stroke from atrial fibrillation.  That medicine is called Eliquis and will take it twice per day.  If any new bleeding on that medication be seen  right away.  If it anytime you feel worsening shortness of breath, lightheadedness, dizziness, chest pain or any new or worsening symptoms proceed directly to the emergency room or call 911.   You have an appointment with cardiology on Monday 02/29/20 at 1:40pm with Dr Margaretann Loveless.  [place]  Call their office if questions [phone]   Atrial Fibrillation  Atrial fibrillation is a type of irregular or rapid heartbeat (arrhythmia). In atrial fibrillation, the top part of the heart (atria) beats in an irregular pattern. This makes the heart unable to pump blood normally and effectively. The goal of treatment is to prevent blood clots from forming, control your heart rate, or restore your heartbeat to a normal rhythm. If this condition is not treated, it can cause serious problems, such as a weakened heart muscle (cardiomyopathy) or a stroke. What are the causes? This condition is often caused by medical conditions that damage the heart's electrical system. These include:  High blood pressure (hypertension). This is the most common cause.  Certain heart problems or conditions, such as heart failure, coronary artery disease, heart valve problems, or heart surgery.  Diabetes.  Overactive thyroid (hyperthyroidism).  Obesity.  Chronic kidney disease. In some cases, the cause of this condition is not known. What increases the risk? This condition is more likely to develop in:  Older people.  People who smoke.  Athletes who do endurance exercise.  People who have a family history of atrial fibrillation.  Men.  People who use drugs.  People who drink a lot of alcohol.  People who have lung conditions, such as emphysema, pneumonia, or COPD.  People who have obstructive sleep apnea. What are the signs or symptoms? Symptoms of this condition include:  A feeling that your heart is racing or beating irregularly.  Discomfort or pain in your  chest.  Shortness of breath.  Sudden light-headedness or weakness.  Tiring easily during exercise or activity.  Fatigue.  Syncope (fainting).  Sweating. In some cases, there are no symptoms. How is this diagnosed? Your health care provider may detect atrial fibrillation when taking your pulse. If detected, this condition may be diagnosed with:  An electrocardiogram (ECG) to check electrical signals of the heart.  An ambulatory cardiac monitor to record your heart's activity for a few days.  A transthoracic echocardiogram (TTE) to create pictures of your heart.  A transesophageal echocardiogram (TEE) to create even closer pictures of your heart.  A stress test to check your blood supply while you exercise.  Imaging tests, such as a CT scan or chest X-ray.  Blood tests. How is this treated? Treatment depends on underlying conditions and how you feel when you experience atrial fibrillation. This condition may be treated with:  Medicines to prevent blood clots or to treat heart rate or heart rhythm problems.  Electrical cardioversion to reset the heart's rhythm.  A pacemaker to correct abnormal heart rhythm.  Ablation to remove the heart tissue that sends abnormal signals.  Left atrial appendage closure to seal the area where blood clots can form. In some cases, underlying conditions will be treated. Follow these  instructions at home: Medicines  Take over-the counter and prescription medicines only as told by your health care provider.  Do not take any new medicines without talking to your health care provider.  If you are taking blood thinners: ? Talk with your health care provider before you take any medicines that contain aspirin or NSAIDs, such as ibuprofen. These medicines increase your risk for dangerous bleeding. ? Take your medicine exactly as told, at the same time every day. ? Avoid activities that could cause injury or bruising, and follow instructions  about how to prevent falls. ? Wear a medical alert bracelet or carry a card that lists what medicines you take. Lifestyle      Do not use any products that contain nicotine or tobacco, such as cigarettes, e-cigarettes, and chewing tobacco. If you need help quitting, ask your health care provider.  Eat heart-healthy foods. Talk with a dietitian to make an eating plan that is right for you.  Exercise regularly as told by your health care provider.  Do not drink alcohol.  Lose weight if you are overweight.  Do not use drugs, including cannabis. General instructions  If you have obstructive sleep apnea, manage your condition as told by your health care provider.  Do not use diet pills unless your health care provider approves. Diet pills can make heart problems worse.  Keep all follow-up visits as told by your health care provider. This is important. Contact a health care provider if you:  Notice a change in the rate, rhythm, or strength of your heartbeat.  Are taking a blood thinner and you notice more bruising.  Tire more easily when you exercise or do heavy work.  Have a sudden change in weight. Get help right away if you have:   Chest pain, abdominal pain, sweating, or weakness.  Trouble breathing.  Side effects of blood thinners, such as blood in your vomit, stool, or urine, or bleeding that cannot stop.  Any symptoms of a stroke. "BE FAST" is an easy way to remember the main warning signs of a stroke: ? B - Balance. Signs are dizziness, sudden trouble walking, or loss of balance. ? E - Eyes. Signs are trouble seeing or a sudden change in vision. ? F - Face. Signs are sudden weakness or numbness of the face, or the face or eyelid drooping on one side. ? A - Arms. Signs are weakness or numbness in an arm. This happens suddenly and usually on one side of the body. ? S - Speech. Signs are sudden trouble speaking, slurred speech, or trouble understanding what people  say. ? T - Time. Time to call emergency services. Write down what time symptoms started.  Other signs of a stroke, such as: ? A sudden, severe headache with no known cause. ? Nausea or vomiting. ? Seizure. These symptoms may represent a serious problem that is an emergency. Do not wait to see if the symptoms will go away. Get medical help right away. Call your local emergency services (911 in the [place] Do not drive yourself to the hospital. Summary  Atrial fibrillation is a type of irregular or rapid heartbeat (arrhythmia).  Symptoms include a feeling that your heart is beating fast or irregularly.  You may be given medicines to prevent blood clots or to treat heart rate or heart rhythm problems.  Get help right away if you have signs or symptoms of a stroke.  Get help right away if you cannot catch your breath or  have chest pain or pressure. This information is not intended to replace advice given to you by your health care provider. Make sure you discuss any questions you have with your health care provider. Document Revised: 09/17/2018 Document Reviewed: 09/17/2018 Elsevier Patient Education  [place] watching diet. I will check labs today.  I do recommend eye specialist once per year.      Type 2 Diabetes Mellitus, Self Care, Adult When you have type 2 diabetes (type 2 diabetes mellitus), you must make sure your blood sugar (glucose) stays in a healthy range. You can do this with:  Nutrition.  Exercise.  Lifestyle changes.  Medicines or insulin, if needed.  Support from your doctors and others. How to stay aware of blood sugar   Check your blood sugar level every day, as often as told.  Have your A1c (hemoglobin A1c) level checked two or more times a year. Have it checked more often if your doctor tells you to. Your doctor will set personal treatment goals for you. Generally, you should have these blood sugar levels:  Before meals  (preprandial): 80-130 mg/dL (4.4-7.2 mmol/L).  After meals (postprandial): below 180 mg/dL (10 mmol/L).  A1c level: less than 7%. How to manage high and low blood sugar Signs of high blood sugar High blood sugar is called hyperglycemia. Know the signs of high blood sugar. Signs may include:  Feeling: ? Thirsty. ? Hungry. ? Very tired.  Needing to pee (urinate) more than usual.  Blurry vision. Signs of low blood sugar Low blood sugar is called hypoglycemia. This is when blood sugar is at or below 70 mg/dL (3.9 mmol/L). Signs may include:  Feeling: ? Hungry. ? Worried or nervous (anxious). ? Sweaty and clammy. ? Confused. ? Dizzy. ? Sleepy. ? Sick to your stomach (nauseous).  Having: ? A fast heartbeat. ? A headache. ? A change in your vision. ? Jerky movements that you cannot control (seizure). ? Tingling or no feeling (numbness) around your mouth, lips, or tongue.  Having trouble with: ? Moving (coordination). ? Sleeping. ? Passing out (fainting). ? Getting upset easily (irritability). Treating low blood sugar To treat low blood sugar, eat or drink something sugary right away. If you can think clearly and swallow safely, follow the 15:15 rule:  Take 15 grams of a fast-acting carb (carbohydrate). Talk with your doctor about how much you should take.  Some fast-acting carbs are: ? Sugar tablets (glucose pills). Take 3-4 pills. ? 6-8 pieces of hard candy. ? 4-6 oz (120-150 mL) of fruit juice. ? 4-6 oz (120-150 mL) of regular (not diet) soda. ? 1 Tbsp (15 mL) honey or sugar.  Check your blood sugar 15 minutes after you take the carb.  If your blood sugar is still at or below 70 mg/dL (3.9 mmol/L), take 15 grams of a carb again.  If your blood sugar does not go above 70 mg/dL (3.9 mmol/L) after 3 tries, get help right away.  After your blood sugar goes back to normal, eat a meal or a snack within 1 hour. Treating very low blood sugar If your blood sugar is at  or below 54 mg/dL (3 mmol/L), you have very low blood sugar (severe hypoglycemia). This is an emergency. Do not wait to see if the symptoms will go away. Get medical help right away. Call your local emergency services (911 in the [place] If you have very low blood sugar and you cannot eat or drink, you  may need a glucagon shot (injection). A family member or friend should learn how to check your blood sugar and how to give you a glucagon shot. Ask your doctor if you need to have a glucagon shot kit at home. Follow these instructions at home: Medicine  Take insulin and diabetes medicines as told.  If your doctor says you should take more or less insulin and medicines, do this exactly as told.  Do not run out of insulin or medicines. Having diabetes can raise your risk for other long-term conditions. These include heart disease and kidney disease. Your doctor may prescribe medicines to help you not have these problems. Food   Make healthy food choices. These include: ? Chicken, fish, egg whites, and beans. ? Oats, whole wheat, bulgur, brown rice, quinoa, and millet. ? Fresh fruits and vegetables. ? Low-fat dairy products. ? Nuts, avocado, olive oil, and canola oil.  Meet with a food specialist (dietitian). He or she can help you make an eating plan that is right for you.  Follow instructions from your doctor about what you cannot eat or drink.  Drink enough fluid to keep your pee (urine) pale yellow.  Keep track of carbs that you eat. Do this by reading food labels and learning food serving sizes.  Follow your sick day plan when you cannot eat or drink normally. Make this plan with your doctor so it is ready to use. Activity  Exercise 3 or more times a week.  Do not go more than 2 days without exercising.  Talk with your doctor before you start a new exercise. Your doctor may need to tell you to change: ? How much insulin or medicines you take. ? How much food you  eat. Lifestyle  Do not use any tobacco products. These include cigarettes, chewing tobacco, and e-cigarettes. If you need help quitting, ask your doctor.  Ask your doctor how much alcohol is safe for you.  Learn to deal with stress. If you need help with this, ask your doctor. Body care   Stay up to date with your shots (immunizations).  Have your eyes and feet checked by a doctor as often as told.  Check your skin and feet every day. Check for cuts, bruises, redness, blisters, or sores.  Brush your teeth and gums two times a day. Floss one or more times a day.  Go to the dentist one or more times every 6 months.  Stay at a healthy weight. General instructions  Take over-the-counter and prescription medicines only as told by your doctor.  Share your diabetes care plan with: ? Your work or school. ? People you live with.  Carry a card or wear jewelry that says you have diabetes.  Keep all follow-up visits as told by your doctor. This is important. Questions to ask your doctor  Do I need to meet with a diabetes educator?  Where can I find a support group for people with diabetes? Where to find more information To learn more about diabetes, visit:  American Diabetes Association: www.diabetes.org  American Association of Diabetes Educators: www.diabeteseducator.org Summary  When you have type 2 diabetes, you must make sure your blood sugar (glucose) stays in a healthy range.  Check your blood sugar every day, as often as told.  Having diabetes can raise your risk for other conditions. Your doctor may prescribe medicines to help you not have these problems.  Keep all follow-up visits as told by your doctor. This is important. This information  is not intended to replace advice given to you by your health care provider. Make sure you discuss any questions you have with your health care provider. Document Revised: 09/16/2017 Document Reviewed: 04/29/2015 Elsevier  Patient Education  [employer].    If you have lab work done today you will be contacted with your lab results within the next 2 weeks.  If you have not heard from [place] then please contact us. The fastest way to get your results is to register for My Chart.   IF you received an x-ray today, you will receive an invoice from [facility]. Please contact [facility] at [phone] with questions or concerns regarding your invoice.   IF you received labwork today, you will receive an invoice from [facility]. Please contact [facility] at [phone] with questions or concerns regarding your invoice.   Our billing staff will not be able to assist you with questions regarding bills from these companies.  You will be contacted with the lab results as soon as they are available. The fastest way to get your results is to activate your My Chart account. Instructions are located on the last page of this paperwork. If you have not heard from [place] regarding the results in 2 weeks, please contact this office.         Signed, Merri Ray, MD [facility]

## 2020-02-27 LAB — CBC
Hematocrit: 48.2 % — ABNORMAL HIGH (ref 34.0–46.6)
Hemoglobin: 15 g/dL (ref 11.1–15.9)
MCH: 27.2 pg (ref 26.6–33.0)
MCHC: 31.1 g/dL — ABNORMAL LOW (ref 31.5–35.7)
MCV: 88 fL (ref 79–97)
Platelets: 283 10*3/uL (ref 150–450)
RBC: 5.51 x10E6/uL — ABNORMAL HIGH (ref 3.77–5.28)
RDW: 12 % (ref 11.7–15.4)
WBC: 8.3 10*3/uL (ref 3.4–10.8)

## 2020-02-27 LAB — COMPREHENSIVE METABOLIC PANEL
ALT: 15 IU/L (ref 0–32)
AST: 15 IU/L (ref 0–40)
Albumin/Globulin Ratio: 1.5 (ref 1.2–2.2)
Albumin: 4.1 g/dL (ref 3.8–4.8)
Alkaline Phosphatase: 86 IU/L (ref 44–121)
BUN/Creatinine Ratio: 14 (ref 12–28)
BUN: 11 mg/dL (ref 8–27)
Bilirubin Total: 0.7 mg/dL (ref 0.0–1.2)
CO2: 20 mmol/L (ref 20–29)
Calcium: 9.2 mg/dL (ref 8.7–10.3)
Chloride: 101 mmol/L (ref 96–106)
Creatinine, Ser: 0.81 mg/dL (ref 0.57–1.00)
GFR calc Af Amer: 91 mL/min/{1.73_m2} (ref >59)
GFR calc non Af Amer: 79 mL/min/{1.73_m2} (ref >59)
Globulin, Total: 2.8 g/dL (ref 1.5–4.5)
Glucose: 190 mg/dL — ABNORMAL HIGH (ref 65–99)
Potassium: 3.9 mmol/L (ref 3.5–5.2)
Sodium: 140 mmol/L (ref 134–144)
Total Protein: 6.9 g/dL (ref 6.0–8.5)

## 2020-02-27 LAB — LIPID PANEL
Chol/HDL Ratio: 3.6 ratio (ref 0.0–4.4)
Cholesterol, Total: 168 mg/dL (ref 100–199)
HDL: 47 mg/dL (ref >39)
LDL Chol Calc (NIH): 105 mg/dL — ABNORMAL HIGH (ref 0–99)
Triglycerides: 87 mg/dL (ref 0–149)
VLDL Cholesterol Cal: 16 mg/dL (ref 5–40)

## 2020-02-27 LAB — PRO B NATRIURETIC PEPTIDE: NT-Pro BNP: 758 pg/mL — ABNORMAL HIGH (ref 0–287)

## 2020-02-27 LAB — TSH: TSH: 2.34 u[IU]/mL (ref 0.450–4.500)

## 2020-02-27 LAB — HEMOGLOBIN A1C
Est. average glucose Bld gHb Est-mCnc: 171 mg/dL
Hgb A1c MFr Bld: 7.6 % — ABNORMAL HIGH (ref 4.8–5.6)

## 2020-02-27 LAB — MICROALBUMIN / CREATININE URINE RATIO
Creatinine, Urine: 287.3 mg/dL
Microalb/Creat Ratio: 17 mg/g creat (ref 0–29)
Microalbumin, Urine: 50.2 ug/mL

## 2020-02-29 ENCOUNTER — Other Ambulatory Visit: Payer: Self-pay

## 2020-02-29 ENCOUNTER — Encounter: Payer: Self-pay | Admitting: Internal Medicine

## 2020-02-29 ENCOUNTER — Ambulatory Visit (INDEPENDENT_AMBULATORY_CARE_PROVIDER_SITE_OTHER): Payer: 59 | Admitting: Internal Medicine

## 2020-02-29 VITALS — BP 123/74 | HR 118 | Ht 70.5 in | Wt 319.2 lb

## 2020-02-29 DIAGNOSIS — R0683 Snoring: Secondary | ICD-10-CM | POA: Diagnosis not present

## 2020-02-29 DIAGNOSIS — I4891 Unspecified atrial fibrillation: Secondary | ICD-10-CM | POA: Diagnosis not present

## 2020-02-29 DIAGNOSIS — E782 Mixed hyperlipidemia: Secondary | ICD-10-CM | POA: Diagnosis not present

## 2020-02-29 DIAGNOSIS — I10 Essential (primary) hypertension: Secondary | ICD-10-CM

## 2020-02-29 NOTE — Progress Notes (Signed)
Cardiology Office Note:    Date:  02/29/2020   ID:  Tracie [name], [place] January 04, 1961, MRN [id number]  PCP:  Myles Lipps, MD (Inactive)  Cardiologist:  No primary care provider on file.  Electrophysiologist:  None   Referring MD: No ref. provider found   Chief Complaint/Reason for Referral: New onset afib  History of Present Illness:    Tracie [name] is a 59 y.o. female with a history of hypertension, hyperlipidemia, diabetes, history of DVT, who presents today for evaluation of new onset atrial fibrillation with rapid ventricular response.  The patient was seen on 02/26/2020 by Dr. Neva Seat her primary care physician.  At that visit she was noted to be in atrial fibrillation with rapid ventricular response.  EKG was obtained and I have reviewed this.  Given this finding she was started on Eliquis 5 mg twice daily for CHA2DS2-VASc score of 3 (hypertension, diabetes, female).  She was also increased to metoprolol tartrate 50 mg twice daily.  Patient remains in atrial fibrillation with rapid ventricular response today with heart rate of approximately 118 bpm.  Patient and her husband appear quite troubled at the outset of our visit.  They would like to delay any treatment, investigations, or adjustments to medication therapy until February when her husband's job is less busy.  I have stressed to them that atrial fibrillation with poorly controlled rates can lead to cardiovascular dysfunction, and would recommend more prompt therapy.  We reviewed in great detail options for treatment which include rate control, rhythm control with cardioversion, sooner rhythm control with TEE cardioversion.  I have also recommended an echocardiogram to evaluate left atrial size and left ventricular function given her report of dyspnea on exertion.  She overall states that she feels fine, but on further investigation she notes dyspnea on exertion.  She also snores and has not been evaluated for sleep  apnea.  The patient would like nothing else done today.  When I inquire as to her concerns and hesitation, she tells me "I do not want my heart messed with."  I have recommended at a minimum that we increase her metoprolol dosage since her blood pressure appears it will tolerate this and she needs better rate control.  They are not ready to make this change today.  She denies chest pain.  Does not know she is in atrial fibrillation and denies palpitations.  Denies PND, orthopnea, leg swelling.  Denies dizziness, lightheadedness, syncope.  Denies cough, fever, chills, nausea, vomiting.  TSH is normal, BMP is reviewed in the office with normal electrolytes, normal renal function, normal hepatic function, elevated NT proBNP. CBC shows normal hemoglobin, normal white blood cells, normal platelets.  We attempted to discuss her risk factors including smoking history and diabetes.  Patient is unaware that she has diabetes and feels she has never been told this before.  We discussed hemoglobin A1c and diagnosis of diabetes. Past Medical History:  Diagnosis Date  . History of DVT (deep vein thrombosis) 2010  . Hyperlipidemia   . Hypertension     Past Surgical History:  Procedure Laterality Date  . ABDOMINAL HYSTERECTOMY    . FRACTURE SURGERY Left    hand  . KNEE ARTHROSCOPY Right 1990s    Current Medications: No outpatient medications have been marked as taking for the 02/29/20 encounter (Office Visit) with Parke Poisson, MD.     Allergies:   Codeine   Social History   Tobacco Use  . Smoking status: Former Smoker  Packs/day: 1.00    Years: 20.00    Pack years: 20.00    Types: Cigarettes    Quit date: 2010    Years since quitting: 11.8  . Smokeless tobacco: Never Used  Vaping Use  . Vaping Use: Never used  Substance Use Topics  . Alcohol use: Never  . Drug use: Not Currently     Family History: The patient's family history includes Healthy in her son; Heart attack in  her father; Heart disease in her brother; Hypertension in her mother. There is no history of Breast cancer.  ROS:   Please see the history of present illness.    All other systems reviewed and are negative.  EKGs/Labs/Other Studies Reviewed:    The following studies were reviewed today:  EKG: Atrial fibrillation with rapid ventricular response, heart rate 118 bpm.  Recent Labs: 02/26/2020: ALT 15; BUN 11; Creatinine, Ser 0.81; Hemoglobin 15.0; NT-Pro BNP 758; Platelets 283; Potassium 3.9; Sodium 140; TSH 2.340  Recent Lipid Panel    Component Value Date/Time   CHOL 168 02/26/2020 1619   TRIG 87 02/26/2020 1619   HDL 47 02/26/2020 1619   CHOLHDL 3.6 02/26/2020 1619   LDLCALC 105 (H) 02/26/2020 1619    Physical Exam:    VS:  BP 123/74 (BP Location: Left Arm, Patient Position: Sitting)   Pulse (!) 118   Ht 5' 10.5" (1.791 m)   Wt (!) 319 lb 3.2 oz (144.8 kg)   SpO2 96%   BMI 45.15 kg/m     Wt Readings from Last 5 Encounters:  02/29/20 (!) 319 lb 3.2 oz (144.8 kg)  02/26/20 (!) 313 lb (142 kg)  07/27/19 (!) 302 lb (137 kg)  01/19/19 (!) 309 lb (140.2 kg)  12/30/18 (!) 310 lb (140.6 kg)    Constitutional: No acute distress Eyes: sclera non-icteric, normal conjunctiva and lids ENMT: normal dentition, moist mucous membranes Cardiovascular: regular rhythm, normal rate, no murmurs. S1 and S2 normal. Radial pulses normal bilaterally. No jugular venous distention.  Respiratory: clear to auscultation bilaterally GI : normal bowel sounds, soft and nontender. No distention.   MSK: extremities warm, well perfused. No edema.  NEURO: grossly nonfocal exam, moves all extremities. PSYCH: alert and oriented x 3, normal mood and affect.   ASSESSMENT:    1. Atrial fibrillation, unspecified type (HCC)   2. Mixed hyperlipidemia   3. Essential hypertension, benign   4. Snoring    PLAN:    The patient has atrial fibrillation with rapid ventricular response.  I have recommended an  increase in the dose of metoprolol which she declines today.  I have stressed the importance of adequate rate control less than approximately 110 beats a minute in the ambulatory setting to avoid possible tachycardia mediated cardiomyopathies.  She feels the dose was just increased and is not interested in increasing the dose at this time.  I recommend continuing Eliquis 5 mg twice daily for a CHA2DS2-VASc score of 3.  They are concerned about lifelong anticoagulation and feel that this should only be a temporary therapy we did discuss stroke risk in A. fib and that risk factors indicates she may need indefinite anticoagulation barring any bleeding events.  I have recommended an echocardiogram to guide our management.  They are not quite ready to schedule this yet.  I have encouraged him to contact the office when they are ready to schedule a transthoracic echocardiogram.  I have also discussed with them elective cardioversion in 3 to 4 weeks after  anticoagulation has been taken without interruption if she remains in atrial fibrillation then we can cardiovert as she warrants a trial of restoration of sinus rhythm.  They are quite troubled by this discussion.  I have shared with them that patients can safely live in atrial fibrillation for many years, as long as we are able to get better rate control.  We also briefly discussed TEE cardioversion as a more prompt strategy, however this was too invasive for them at this time.  I have offered them a visit with the atrial fibrillation clinic for better guidance and management, they declined.  I would encourage the patient to discuss with her family members when she goes home today and call me in the next several days with her decisions about next steps.  In addition she can contact Dr. Chilton SiGreen for further recommendations.  I look forward to caring for Tracie [name] going forward, and would be happy to answer any additional questions she or her husband may have with  regard to atrial fibrillation and management.  Total time of encounter: 60 minutes total time of encounter, including 40 minutes spent in face-to-face patient care on the date of this encounter. This time includes coordination of care and counseling regarding above mentioned problem list. Remainder of non-face-to-face time involved reviewing chart documents/testing relevant to the patient encounter and documentation in the medical record. I have independently reviewed documentation from referring provider.   Weston BrassGayatri Jarmaine Ehrler, MD [place]  [facility]    Medication Adjustments/Labs and Tests Ordered: Current medicines are reviewed at length with the patient today.  Concerns regarding medicines are outlined above.   Orders Placed This Encounter  Procedures  . EKG 12-Lead    No orders of the defined types were placed in this encounter.   Patient Instructions  Medication Instructions:  No Changes In Medications at this time.  *If you need a refill on your cardiac medications before your next appointment, please call your pharmacy*  Lab Work: None Ordered At This Time.  If you have labs (blood work) drawn today and your tests are completely normal, you will receive your results only by: Marland Kitchen. MyChart Message (if you have MyChart) OR . A paper copy in the mail If you have any lab test that is abnormal or we need to change your treatment, we will call you to review the results.  Testing/Procedures: None Ordered At This Time.   Follow-Up: At [facility], you and your health needs are our priority.  As part of our continuing mission to provide you with exceptional heart care, we have created designated Provider Care Teams.  These Care Teams include your primary Cardiologist (physician) and Advanced Practice Providers (APPs -  Physician Assistants and Nurse Practitioners) who all work together to provide you with the care you need, when you need it.  We recommend signing up for the  patient portal called "MyChart".  Sign up information is provided on this After Visit Summary.  MyChart is used to connect with patients for Virtual Visits (Telemedicine).  Patients are able to view lab/test results, encounter notes, upcoming appointments, etc.  Non-urgent messages can be sent to your provider as well.   To learn more about what you can do with MyChart, go to [email or web address].    Your next appointment:   Per Dr. Jacques NavyAcharya Call for a follow up appointment with in a month.   Strongly recommend Echocardiogram for Dyspnea on Exertion and Atrial Fibrillation.   Recommend further rate control for  A-Fib.

## 2020-02-29 NOTE — Patient Instructions (Signed)
Medication Instructions:  No Changes In Medications at this time.  *If you need a refill on your cardiac medications before your next appointment, please call your pharmacy*  Lab Work: None Ordered At This Time.  If you have labs (blood work) drawn today and your tests are completely normal, you will receive your results only by: Marland Kitchen MyChart Message (if you have MyChart) OR . A paper copy in the mail If you have any lab test that is abnormal or we need to change your treatment, we will call you to review the results.  Testing/Procedures: None Ordered At This Time.   Follow-Up: At [facility], you and your health needs are our priority.  As part of our continuing mission to provide you with exceptional heart care, we have created designated Provider Care Teams.  These Care Teams include your primary Cardiologist (physician) and Advanced Practice Providers (APPs -  Physician Assistants and Nurse Practitioners) who all work together to provide you with the care you need, when you need it.  We recommend signing up for the patient portal called "MyChart".  Sign up information is provided on this After Visit Summary.  MyChart is used to connect with patients for Virtual Visits (Telemedicine).  Patients are able to view lab/test results, encounter notes, upcoming appointments, etc.  Non-urgent messages can be sent to your provider as well.   To learn more about what you can do with MyChart, go to [email or web address].    Your next appointment:   Per Dr. Jacques Navy Call for a follow up appointment with in a month.   Strongly recommend Echocardiogram for Dyspnea on Exertion and Atrial Fibrillation.   Recommend further rate control for A-Fib.

## 2020-03-07 ENCOUNTER — Telehealth: Payer: Self-pay | Admitting: Family Medicine

## 2020-03-07 NOTE — Telephone Encounter (Signed)
Pt was returning a call about labs results. I read them off to pt, she stated she can not come in for that 3 to 4 week f/u until her next appt that is sch for May because she travels with her husband for his work right now. Please advise.

## 2020-03-09 NOTE — Telephone Encounter (Signed)
I have called the pt back and she stated that she was returning a call from the provider.    She does know her lab results and has an appointment in May to see the provider.   Dr. Neva Seat please give pt a call back when you have a chance.   Thanks

## 2020-03-09 NOTE — Telephone Encounter (Signed)
Patient returning provider's call for lab results. Please advise at [phone]

## 2020-03-14 NOTE — Progress Notes (Signed)
Attempted to call pt once more no answer so I left a message to call back and sent a letter to pt home with provider message attached.

## 2020-03-15 NOTE — Telephone Encounter (Signed)
Noted, but I am concerned that her blood sugar was 190 and increased A1c. Would recommend repeat A1c in 3 months at the most.  At current level of A1c, could consider medication for diabetes, but if no medication used, will need to watch diet, activity to help with weight loss.  Follow-up sooner if any new symptoms such as blurry vision, frequent urination, increased thirst, or other new symptoms.  Let me know if there are questions with this plan or if a call is still needed.

## 2020-03-16 NOTE — Telephone Encounter (Signed)
LVMTCB to sch 3 month appt instead of 6 month appt

## 2020-03-16 NOTE — Telephone Encounter (Signed)
Dr. Neva Seat is asking for this to come in for 3 month follow up instead of waiting until May due to recent labs. A1c needs to be monitored

## 2020-03-16 NOTE — Telephone Encounter (Signed)
Dr. Delsa Bern Patient states she can not come in till May

## 2020-03-16 NOTE — Telephone Encounter (Signed)
Pt called to say she cant come in patients husband is still on leaf season and she cant come in until May.patients insists she cannpt come in .   Please advise offered [facility] [employer] patient still does not want to come in

## 2020-03-18 IMAGING — MG MM DIGITAL SCREENING BILAT W/ CAD
1 series · 1 of 1 positions shown · non-contrast
Comparison: Previous exam(s).

CLINICAL DATA: Screening.

EXAM:
DIGITAL SCREENING BILATERAL MAMMOGRAM WITH CAD

[L CC]
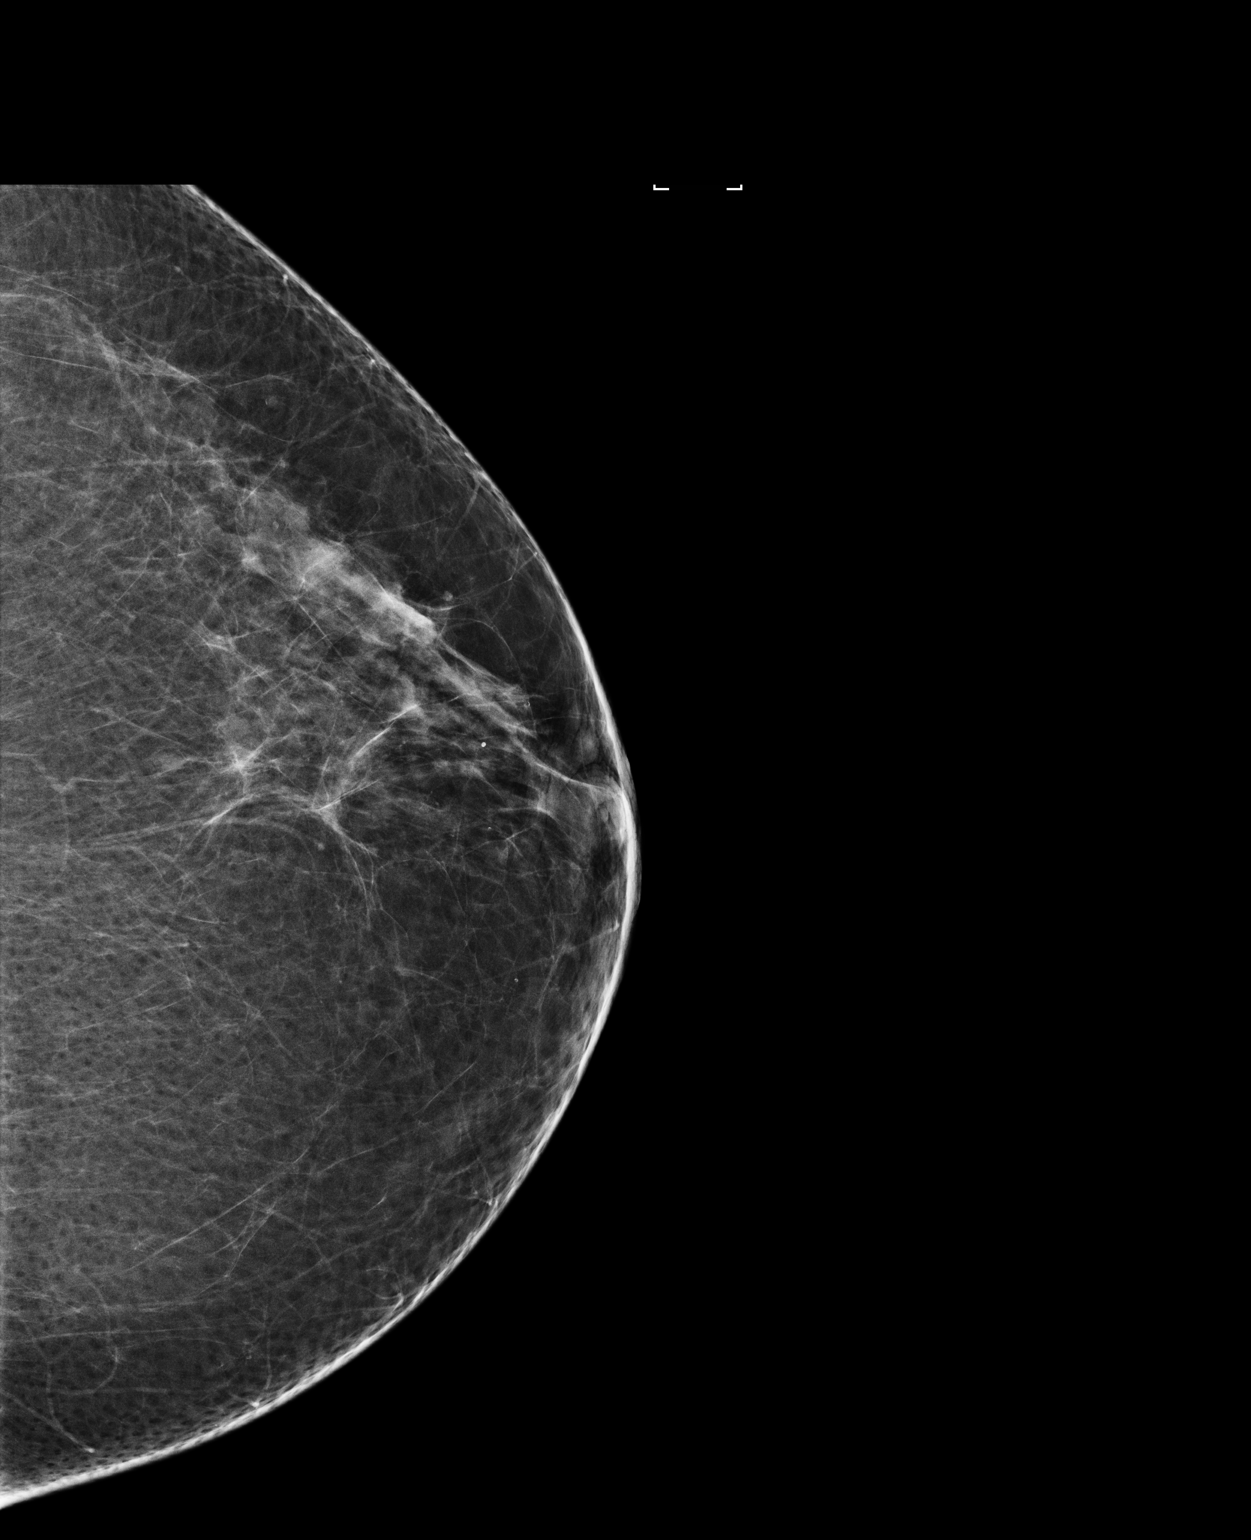

[1 of 1 positions shown; findings below may reference images not displayed]

ACR Breast Density Category b: There are scattered areas of
fibroglandular density.
FINDINGS: There are no findings suspicious for malignancy. Images were
processed with CAD.
IMPRESSION: No mammographic evidence of malignancy. A result letter of this
screening mammogram will be mailed directly to the patient.

RECOMMENDATION:
Screening mammogram in one year. (Code:AS-G-LCT)

BI-RADS CATEGORY  1: Negative.

## 2020-04-18 ENCOUNTER — Other Ambulatory Visit: Payer: Self-pay | Admitting: Family Medicine

## 2020-04-18 DIAGNOSIS — I4891 Unspecified atrial fibrillation: Secondary | ICD-10-CM

## 2020-04-18 MED ORDER — METOPROLOL TARTRATE 50 MG PO TABS: 50.0000 mg | ORAL_TABLET | Freq: Two times a day (BID) | ORAL | 1 refills | Status: DC

## 2020-04-18 NOTE — Telephone Encounter (Signed)
Copied from CRM [id number]. Topic: Quick Communication - Rx Refill/Question >> Apr 18, 2020  2:57 PM Jaquita Rector A wrote: Medication: metoprolol tartrate (LOPRESSOR) 50 MG tablet  Has the patient contacted their pharmacy? Yes.   (Agent: If no, request that the patient contact the pharmacy for the refill.) (Agent: If yes, when and what did the pharmacy advise?)  Preferred Pharmacy (with phone number or street name): [id number] - [place], [place] - [address] AT [facility] OF [address]  Phone:  [phone] Fax:  [phone]     Agent: Please be advised that RX refills may take up to 3 business days. We ask that you follow-up with your pharmacy.

## 2020-06-17 ENCOUNTER — Other Ambulatory Visit: Payer: Self-pay | Admitting: Family Medicine

## 2020-06-17 DIAGNOSIS — I4891 Unspecified atrial fibrillation: Secondary | ICD-10-CM

## 2020-06-17 NOTE — Telephone Encounter (Signed)
Apologies- forwarded to wrong office 

## 2020-06-17 NOTE — Telephone Encounter (Signed)
Not our pt

## 2020-06-17 NOTE — Telephone Encounter (Signed)
Requested medication (s) are due for refill today -02/26/20 #90 3 RF  Requested medication (s) are on the active medication list -yes  Future visit scheduled -yes  Last refill: 05/19/20  Notes to clinic: Request RF of non delegated Rx  Requested Prescriptions  Pending Prescriptions Disp Refills   cyclobenzaprine (FLEXERIL) 10 MG tablet [Pharmacy Med Name: CYCLOBENZAPRINE 10MG  TABLETS] 90 tablet 3    Sig: TAKE 1 TABLET(10 MG) BY MOUTH THREE TIMES DAILY AS NEEDED FOR MUSCLE SPASMS      Not Delegated - Analgesics:  Muscle Relaxants Failed - 06/17/2020 10:52 AM      Failed - This refill cannot be delegated      Passed - Valid encounter within last 6 months    Recent Outpatient Visits           3 months ago Atrial fibrillation, unspecified type [facility])   Primary Care at [facility], Sunday Shams, MD   10 months ago Essential hypertension, benign   Primary Care at [facility], [facility], MD   1 year ago Essential hypertension, benign   Primary Care at [facility], [facility], MD   1 year ago Essential hypertension, benign   Primary Care at [facility], [facility], MD   1 year ago Essential hypertension, benign   Primary Care at [facility], [facility], MD       Future Appointments             In 2 months Meda Coffee Neva Seat, MD [facility], [facility]                 Requested Prescriptions  Pending Prescriptions Disp Refills   cyclobenzaprine (FLEXERIL) 10 MG tablet [Pharmacy Med Name: CYCLOBENZAPRINE 10MG  TABLETS] 90 tablet 3    Sig: TAKE 1 TABLET(10 MG) BY MOUTH THREE TIMES DAILY AS NEEDED FOR MUSCLE SPASMS      Not Delegated - Analgesics:  Muscle Relaxants Failed - 06/17/2020 10:52 AM      Failed - This refill cannot be delegated      Passed - Valid encounter within last 6 months    Recent Outpatient Visits           3 months ago Atrial fibrillation, unspecified type [facility])   Primary Care at 08/17/2020,  [facility], MD   10 months ago Essential hypertension, benign   Primary Care at [facility], Asencion Partridge, MD   1 year ago Essential hypertension, benign   Primary Care at [facility], Meda Coffee, MD   1 year ago Essential hypertension, benign   Primary Care at [facility], Meda Coffee, MD   1 year ago Essential hypertension, benign   Primary Care at [facility], Meda Coffee, MD       Future Appointments             In 2 months [facility], Meda Coffee, MD [facility], [facility]

## 2020-08-31 ENCOUNTER — Ambulatory Visit: Payer: Self-pay | Admitting: Family Medicine

## 2020-12-02 ENCOUNTER — Ambulatory Visit
Admission: RE | Admit: 2020-12-02 | Discharge: 2020-12-02 | Disposition: A | Discharge status: Home / Self Care | Payer: 59 | Source: Ambulatory Visit | Attending: Sports Medicine | Admitting: Sports Medicine

## 2020-12-02 ENCOUNTER — Other Ambulatory Visit: Payer: Self-pay | Admitting: Sports Medicine

## 2020-12-02 ENCOUNTER — Other Ambulatory Visit: Payer: Self-pay

## 2020-12-02 DIAGNOSIS — M549 Dorsalgia, unspecified: Secondary | ICD-10-CM

## 2020-12-02 DIAGNOSIS — M25551 Pain in right hip: Secondary | ICD-10-CM

## 2021-08-14 NOTE — Telephone Encounter (Signed)
NA

## 2022-02-27 IMAGING — CR DG HIP (WITH OR WITHOUT PELVIS) 2-3V*R*
2 series · 2 of 2 positions shown · non-contrast
Comparison: [DATE] [DATE], [DATE], [DATE] [DATE], [DATE]

CLINICAL DATA: RIGHT HIP PAIN

EXAM:
DG HIP (WITH OR WITHOUT PELVIS) 2-3V RIGHT

[w hip ap right]
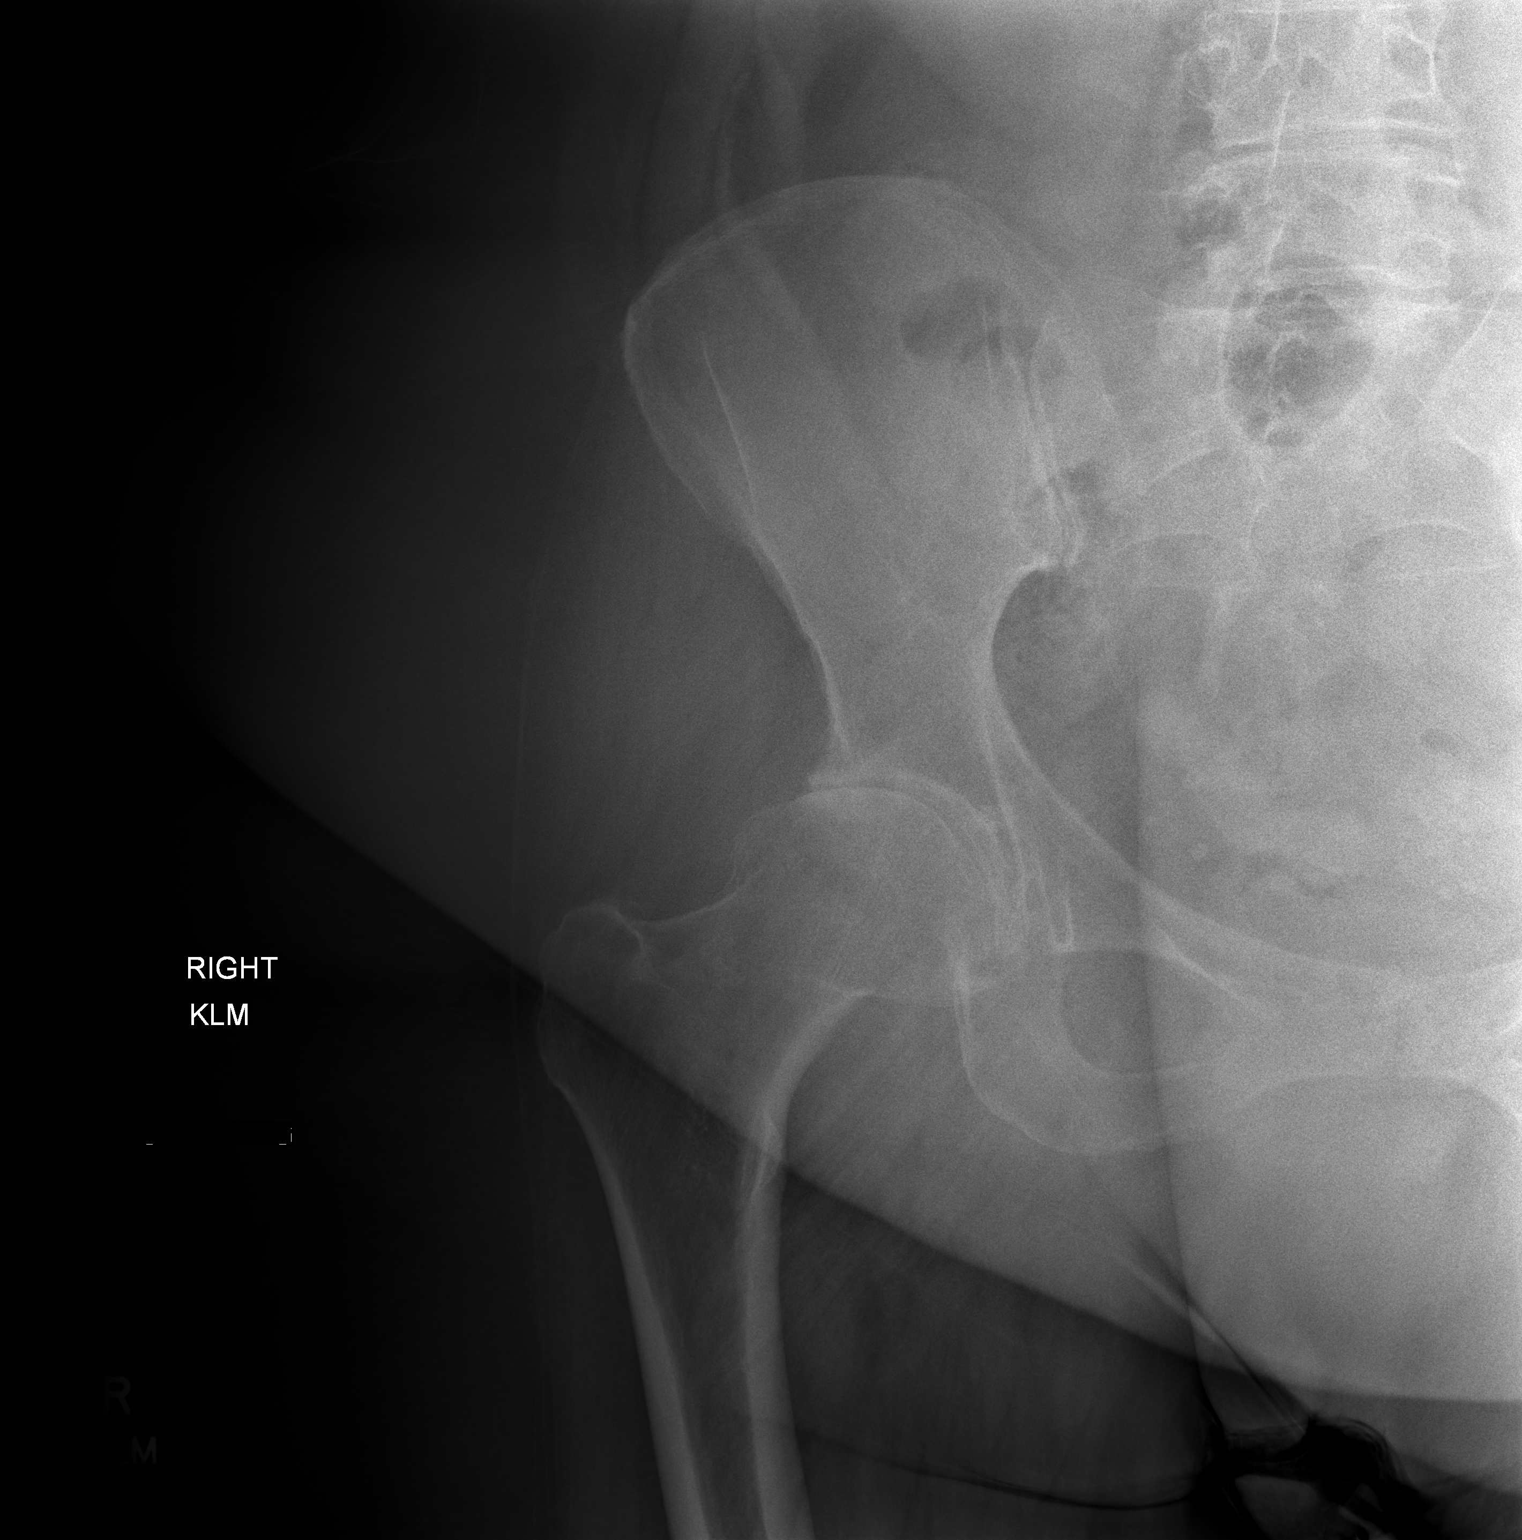

[w hip frog right]
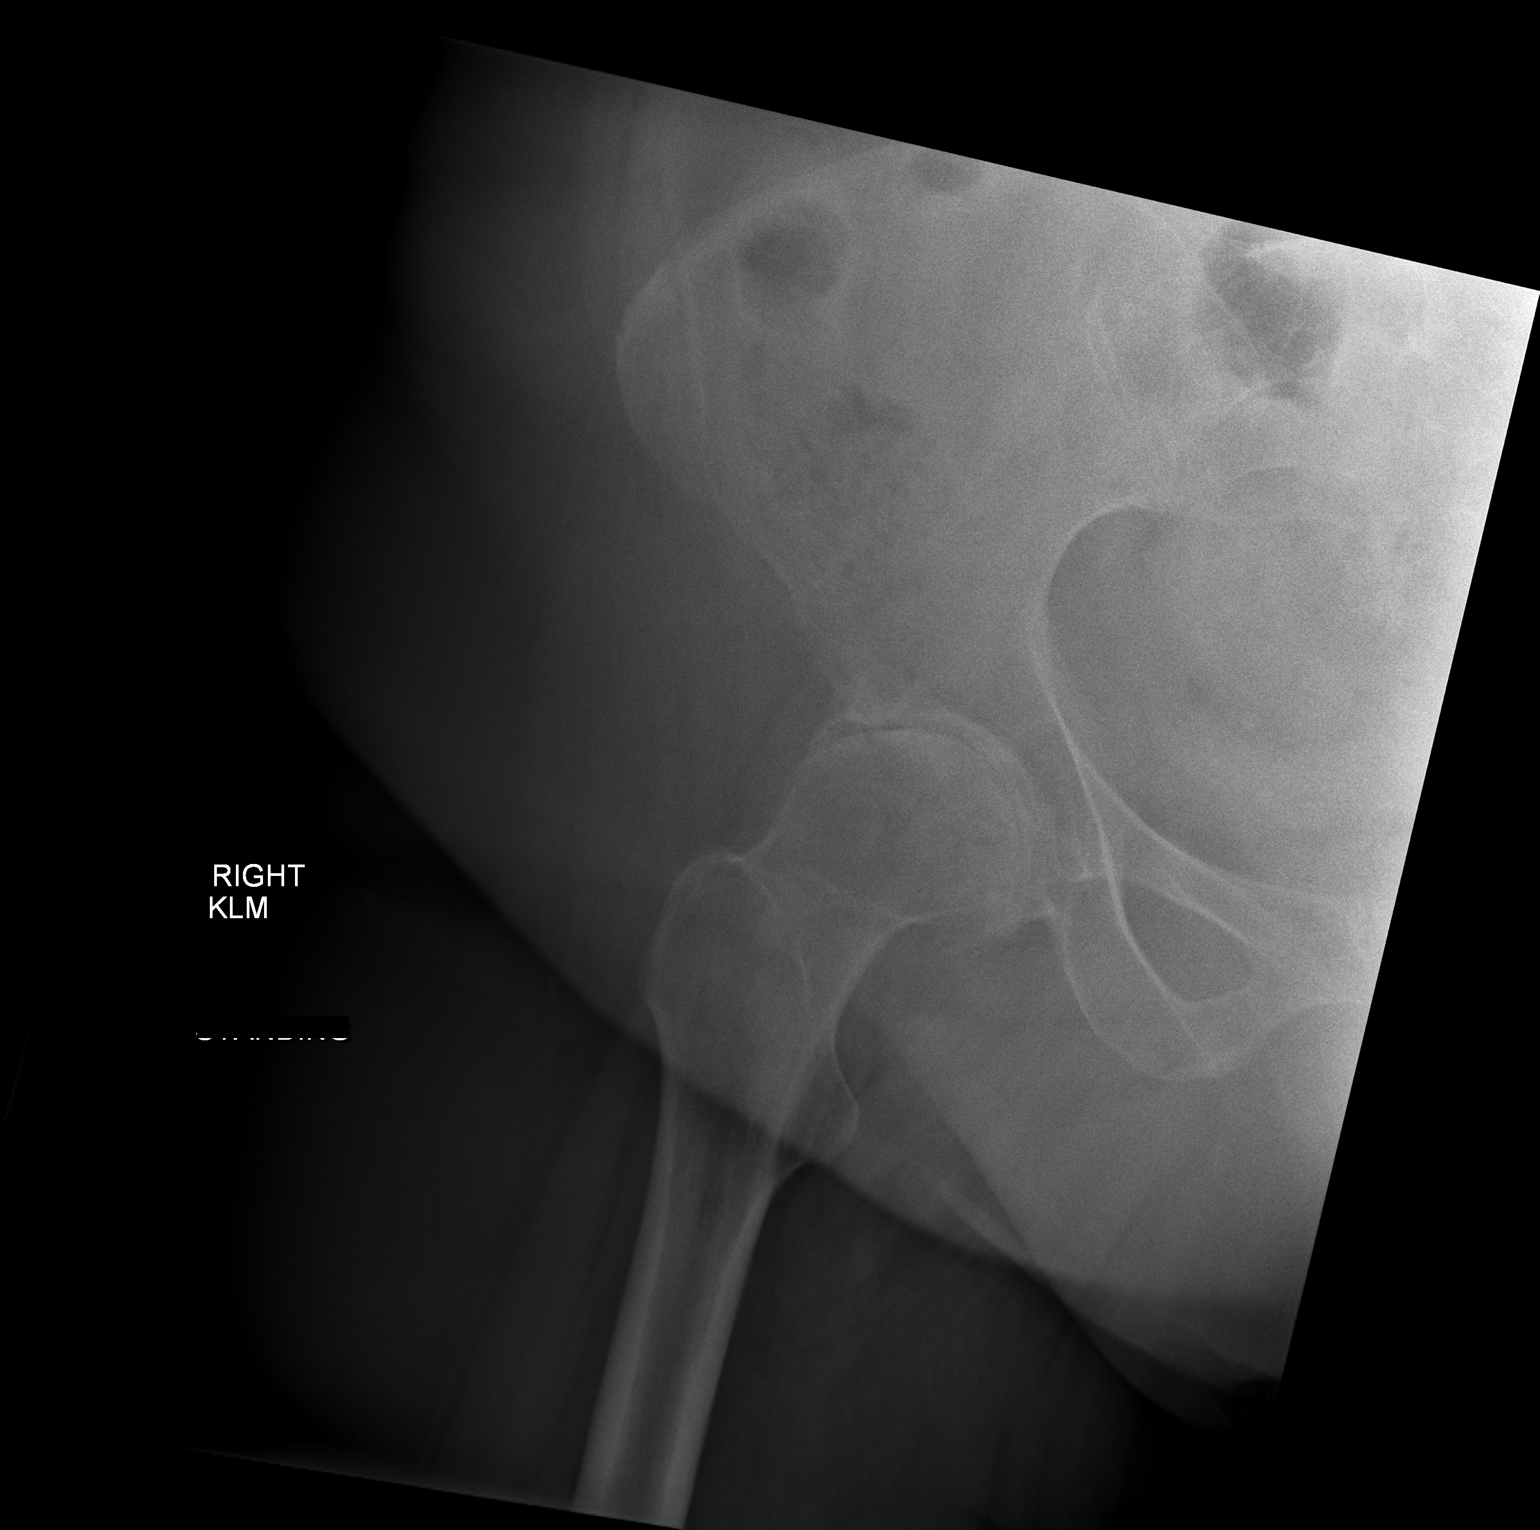

[2 of 2 positions shown; findings below may reference images not displayed]

FINDINGS: No acute fracture or dislocation. There are advanced degenerative
changes of the RIGHT hip with osseous remodeling of the femoral
head, bone-on-bone apposition and subchondral sclerosis. There is
rim osteophyte formation. This is progressed since 5455. No area of
erosion or osseous destruction. No unexpected radiopaque foreign
body. Pelvic phleboliths.
IMPRESSION: Advanced degenerative changes of the RIGHT hip.

## 2022-06-19 DIAGNOSIS — E538 Deficiency of other specified B group vitamins: Secondary | ICD-10-CM | POA: Diagnosis not present

## 2022-06-19 DIAGNOSIS — G720 Drug-induced myopathy: Secondary | ICD-10-CM | POA: Diagnosis not present

## 2022-06-19 DIAGNOSIS — I10 Essential (primary) hypertension: Secondary | ICD-10-CM | POA: Diagnosis not present

## 2022-06-19 DIAGNOSIS — E119 Type 2 diabetes mellitus without complications: Secondary | ICD-10-CM | POA: Diagnosis not present

## 2022-06-19 DIAGNOSIS — Z6841 Body Mass Index (BMI) 40.0 and over, adult: Secondary | ICD-10-CM | POA: Diagnosis not present

## 2022-06-19 DIAGNOSIS — E118 Type 2 diabetes mellitus with unspecified complications: Secondary | ICD-10-CM | POA: Diagnosis not present

## 2022-10-10 DIAGNOSIS — M1611 Unilateral primary osteoarthritis, right hip: Secondary | ICD-10-CM | POA: Diagnosis not present

## 2022-10-10 DIAGNOSIS — E538 Deficiency of other specified B group vitamins: Secondary | ICD-10-CM | POA: Diagnosis not present

## 2022-10-10 DIAGNOSIS — I4891 Unspecified atrial fibrillation: Secondary | ICD-10-CM | POA: Diagnosis not present

## 2022-10-10 DIAGNOSIS — D6869 Other thrombophilia: Secondary | ICD-10-CM | POA: Diagnosis not present

## 2022-10-10 DIAGNOSIS — I10 Essential (primary) hypertension: Secondary | ICD-10-CM | POA: Diagnosis not present

## 2022-10-10 DIAGNOSIS — E118 Type 2 diabetes mellitus with unspecified complications: Secondary | ICD-10-CM | POA: Diagnosis not present

## 2022-10-10 DIAGNOSIS — E1136 Type 2 diabetes mellitus with diabetic cataract: Secondary | ICD-10-CM | POA: Diagnosis not present

## 2022-10-10 DIAGNOSIS — Z1159 Encounter for screening for other viral diseases: Secondary | ICD-10-CM | POA: Diagnosis not present

## 2022-10-10 DIAGNOSIS — M7918 Myalgia, other site: Secondary | ICD-10-CM | POA: Diagnosis not present

## 2022-10-10 DIAGNOSIS — Z0000 Encounter for general adult medical examination without abnormal findings: Secondary | ICD-10-CM | POA: Diagnosis not present

## 2023-02-11 DIAGNOSIS — M5441 Lumbago with sciatica, right side: Secondary | ICD-10-CM | POA: Diagnosis not present

## 2023-02-15 DIAGNOSIS — M5441 Lumbago with sciatica, right side: Secondary | ICD-10-CM | POA: Diagnosis not present

## 2023-02-15 DIAGNOSIS — E1165 Type 2 diabetes mellitus with hyperglycemia: Secondary | ICD-10-CM | POA: Diagnosis not present

## 2023-02-21 ENCOUNTER — Emergency Department (HOSPITAL_COMMUNITY)
Admission: EM | Admit: 2023-02-21 | Discharge: 2023-02-22 | Disposition: A | Discharge status: Home / Self Care | Payer: PPO | Attending: Student | Admitting: Student

## 2023-02-21 ENCOUNTER — Encounter (HOSPITAL_COMMUNITY): Payer: Self-pay | Admitting: Emergency Medicine

## 2023-02-21 ENCOUNTER — Other Ambulatory Visit: Payer: Self-pay

## 2023-02-21 DIAGNOSIS — M5441 Lumbago with sciatica, right side: Secondary | ICD-10-CM | POA: Insufficient documentation

## 2023-02-21 DIAGNOSIS — Z79899 Other long term (current) drug therapy: Secondary | ICD-10-CM | POA: Diagnosis not present

## 2023-02-21 DIAGNOSIS — Z7901 Long term (current) use of anticoagulants: Secondary | ICD-10-CM | POA: Insufficient documentation

## 2023-02-21 DIAGNOSIS — I10 Essential (primary) hypertension: Secondary | ICD-10-CM | POA: Diagnosis not present

## 2023-02-21 DIAGNOSIS — M545 Low back pain, unspecified: Secondary | ICD-10-CM | POA: Diagnosis present

## 2023-02-21 DIAGNOSIS — M5431 Sciatica, right side: Secondary | ICD-10-CM

## 2023-02-21 MED ORDER — LIDOCAINE 5 % EX PTCH
1.0000 | MEDICATED_PATCH | Freq: Once | CUTANEOUS | Status: DC
  Administered 2023-02-21: 1 via TRANSDERMAL
  Filled 2023-02-21: qty 1

## 2023-02-21 MED ORDER — KETOROLAC TROMETHAMINE 15 MG/ML IJ SOLN
15.0000 mg | Freq: Once | INTRAMUSCULAR | Status: AC
  Administered 2023-02-21: 15 mg via INTRAMUSCULAR
  Filled 2023-02-21: qty 1

## 2023-02-21 MED ORDER — OXYCODONE-ACETAMINOPHEN 5-325 MG PO TABS
1.0000 | ORAL_TABLET | Freq: Once | ORAL | Status: AC
  Administered 2023-02-21: 2 via ORAL
  Filled 2023-02-21: qty 2

## 2023-02-21 NOTE — ED Triage Notes (Signed)
Pt c/o worsening lower back pain that started on Sunday 11/3. Sts she went to bend over and her "back gave out." Has been experiencing ongoing back pain since. Was seen at Urgent Care 11/4 prescribed steroids. PCP prescribed more prednisone and cyclobenzaprine. Also  took husbands tramadol and ibuprofen. All medications without relief. No numbness/weakness of legs. No fall.

## 2023-02-21 NOTE — ED Provider Notes (Signed)
[place] EMERGENCY DEPARTMENT AT [facility] Provider Note   CSN: [id number] Arrival date & time: 02/21/23  2019     History  Chief Complaint  Patient presents with   Back Pain    Tracie [name] is a 62 y.o. female.  Patient with history of hypertension, hyperlipidemia, morbid obesity, presents today with complaints of back pain.  She states that same began 2.5 weeks ago when she was bending over and felt her back 'give out.'  Denies any trauma. States that since then she has had persistent pain in her low back area.  She was taking her husband's old tramadol and ibuprofen 800 mg without relief, however she states these medications are approximately 64 years old so he is not sure how effective they are. She has also been taking muscle relaxers without relief. She notes that she went to her primary care doctor who gave her steroids which she took and notes she had improvement when she was on the steroids, however when she finished the course, her pain worsened again. She states that she had an old back injury in childhood and has had flares of similar pain previously. She notes that she has been diagnosed with sciatica before and this feels similar. Pain radiates down the right leg. Denies numbness/tingling. No loss of bowel or bladder function or saddle paresthesias.  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 No history of malignancy, IVDU, or prolonged steroid use. No fevers or chills. No urinary symptoms.  The history is provided by the patient. No language interpreter was used.  Back Pain      Home Medications Prior to Admission medications   Medication Sig Start Date End Date Taking? Authorizing Provider  amLODipine (NORVASC) 10 MG tablet Take 1 tablet (10 mg total) by mouth daily. 02/26/20   Shade Flood, MD  apixaban (ELIQUIS) 5 MG TABS tablet Take 1 tablet (5 mg total) by mouth 2 (two) times daily. 02/26/20    Shade Flood, MD  cyclobenzaprine (FLEXERIL) 10 MG tablet TAKE 1 TABLET(10 MG) BY MOUTH THREE TIMES DAILY AS NEEDED FOR MUSCLE SPASMS 06/17/20   Shade Flood, MD  losartan (COZAAR) 100 MG tablet TAKE 1 TABLET(100 MG) BY MOUTH DAILY 02/26/20   Shade Flood, MD  metoprolol tartrate (LOPRESSOR) 50 MG tablet TAKE 1 TABLET(50 MG) BY MOUTH TWICE DAILY 06/17/20   Shade Flood, MD      Allergies    Codeine    Review of Systems   Review of Systems  Musculoskeletal:  Positive for back pain.  All other systems reviewed and are negative.   Physical Exam Updated Vital Signs BP 132/85 (BP Location: Left Arm)   Pulse 78   Temp 98 F (36.7 C) (Oral)   Resp 20   Ht 5' 10.5" (1.791 m)   Wt (!) 144.7 kg   SpO2 100%   BMI 45.12 kg/m  Physical Exam Vitals and nursing note reviewed.  Constitutional:      General: She is not in acute distress.    Appearance: Normal appearance. She is normal weight. She is not ill-appearing, toxic-appearing or diaphoretic.  HENT:     Head: Normocephalic and atraumatic.  Cardiovascular:     Rate and Rhythm: Normal rate.  Pulmonary:     Effort: Pulmonary effort is normal. No respiratory distress.  Abdominal:     General: Abdomen is flat.  Palpations: Abdomen is soft.     Tenderness: There is no abdominal tenderness.  Musculoskeletal:        General: Normal range of motion.     Cervical back: Normal range of motion.     Comments: No TTP cervical or thoracic spine. TTP lumbar spine and associated right sided perispinous muscles. DP and PT pulses intact and 2+. Patient ambulatory with some discomfort  Skin:    General: Skin is warm and dry.     Findings: No rash.  Neurological:     General: No focal deficit present.     Mental Status: She is alert.  Psychiatric:        Mood and Affect: Mood normal.        Behavior: Behavior normal.     ED Results / Procedures / Treatments   Labs (all labs ordered are listed, but only abnormal  results are displayed) Labs Reviewed - No data to display  EKG None  Radiology No results found.  Procedures Procedures    Medications Ordered in ED Medications  lidocaine (LIDODERM) 5 % 1 patch (1 patch Transdermal Patch Applied 02/21/23 2257)  oxyCODONE-acetaminophen (PERCOCET/ROXICET) 5-325 MG per tablet 1-2 tablet (2 tablets Oral Given 02/21/23 2257)  ketorolac (TORADOL) 15 MG/ML injection 15 mg (15 mg Intramuscular Given 02/21/23 2257)    ED Course/ Medical Decision Making/ A&P                                 Medical Decision Making Risk Prescription drug management.   Patient presents today with complaints of atraumatic back pain x 2.5 weeks.  She is afebrile, nontoxic-appearing, and in no acute distress with reassuring vital signs. Physical exam reveals TTP right lumbar spine and associated paraspinous muscles.  Patient with good distal pulses and sensation.  She is able to ambulate with some discomfort.  She is without loss of bowel or bladder function, no concern for cauda equina.  No fever, night sweats, weight loss, history of cancer, IVDU, or prolonged steroid use. Considered x-ray imaging of the lumbar spine, however after medication management with toradol, percocet, and a lidocaine patch for symptoms with significant improvement. She feels ready to go home at this time. Given prescription for NSAIDs and a few doses of norco as well as lidocaine patches.  PDMP reviewed.  Patient advised not to drive or operate heavy machinery while taking this medication.  Recommend close outpatient follow-up and return precautions. Will likely need PT which can be facilitated through her PCP. Patient informed of same. Evaluation and diagnostic testing in the emergency department does not suggest an emergent condition requiring admission or immediate intervention beyond what has been performed at this time.  Plan for discharge with close PCP follow-up.  Patient is understanding and amenable  with plan, educated on red flag symptoms that would prompt immediate return.  Patient discharged in stable condition.  Final Clinical Impression(s) / ED Diagnoses Final diagnoses:  Sciatica of right side    Rx / DC Orders ED Discharge Orders          Ordered    lidocaine (LIDODERM) 5 %  Every 24 hours        02/22/23 0000    HYDROcodone-acetaminophen (NORCO/VICODIN) 5-325 MG tablet  Every 6 hours PRN        02/22/23 0001    ibuprofen (ADVIL) 800 MG tablet  3 times daily  02/22/23 0000    HYDROcodone-acetaminophen (NORCO/VICODIN) 5-325 MG tablet  Every 6 hours PRN,   Status:  Discontinued        02/22/23 0000          An After Visit Summary was printed and given to the patient.     Silva Bandy, PA-C 02/22/23 0003    Glendora Score, MD 02/23/23 [phone]

## 2023-02-21 NOTE — Discharge Instructions (Addendum)
Your back pain is most likely due to a muscular strain.  There is been a lot of research on back pain, unfortunately the only thing that seems to really help is Tylenol and ibuprofen.  Relative rest is also important to not lift greater than 10 pounds bending or twisting at the waist.  Please follow-up with your family physician.  The other thing that really seems to benefit patients is physical therapy which your doctor may send you for.  Please return to the emergency department for new numbness or weakness to your arms or legs. Difficulty with urinating or urinating or pooping on yourself.  Also if you cannot feel toilet paper when you wipe or get a fever.   I have given you a prescription for 800 mg ibuprofen tablets.  You may take these tablets 3 times a day or 2 over-the-counter naproxen tablets twice a day for pain. Also take tylenol 1000mg (2 extra strength) four times a day.   Additionally, I have given you a prescription for a few doses of Norco which is a narcotic pain medication for you to take as prescribed as needed for severe pain only.  Do not drive or operate heavy machinery while taking this medication as it can be sedating.  Please be aware of the increased fall risk when taking this medication and exercise extreme caution when taking it.  I have also given you a prescription for lidocaine patches for you to wear as prescribed as needed.   Return if development of any new or worsening symptoms.

## 2023-02-21 NOTE — ED Notes (Signed)
Pt. refused getting in ER bed due to muscle spasms and requested to remain in wheelchair.

## 2023-02-22 MED ORDER — IBUPROFEN 800 MG PO TABS: 800.0000 mg | ORAL_TABLET | Freq: Three times a day (TID) | ORAL | 0 refills | Status: DC

## 2023-02-22 MED ORDER — LIDOCAINE 5 % EX PTCH: 1.0000 | MEDICATED_PATCH | CUTANEOUS | 0 refills | Status: DC

## 2023-02-22 MED ORDER — HYDROCODONE-ACETAMINOPHEN 5-325 MG PO TABS: 1.0000 | ORAL_TABLET | Freq: Four times a day (QID) | ORAL | 0 refills | Status: DC | PRN

## 2023-02-24 ENCOUNTER — Emergency Department (HOSPITAL_COMMUNITY): Payer: PPO

## 2023-02-24 ENCOUNTER — Encounter (HOSPITAL_COMMUNITY): Payer: Self-pay

## 2023-02-24 ENCOUNTER — Observation Stay (HOSPITAL_COMMUNITY): Payer: PPO

## 2023-02-24 ENCOUNTER — Other Ambulatory Visit: Payer: Self-pay

## 2023-02-24 ENCOUNTER — Inpatient Hospital Stay (HOSPITAL_COMMUNITY)
Admission: EM | Admit: 2023-02-24 | Discharge: 2023-03-05 | DRG: 982 | Disposition: A | Discharge status: Home / Self Care | Payer: PPO | Attending: Internal Medicine | Admitting: Internal Medicine

## 2023-02-24 DIAGNOSIS — Z8249 Family history of ischemic heart disease and other diseases of the circulatory system: Secondary | ICD-10-CM

## 2023-02-24 DIAGNOSIS — I4891 Unspecified atrial fibrillation: Secondary | ICD-10-CM | POA: Diagnosis not present

## 2023-02-24 DIAGNOSIS — E876 Hypokalemia: Secondary | ICD-10-CM

## 2023-02-24 DIAGNOSIS — Z86718 Personal history of other venous thrombosis and embolism: Secondary | ICD-10-CM

## 2023-02-24 DIAGNOSIS — M62830 Muscle spasm of back: Secondary | ICD-10-CM | POA: Diagnosis not present

## 2023-02-24 DIAGNOSIS — I700 Atherosclerosis of aorta: Secondary | ICD-10-CM | POA: Diagnosis present

## 2023-02-24 DIAGNOSIS — Z556 Problems related to health literacy: Secondary | ICD-10-CM

## 2023-02-24 DIAGNOSIS — Z6841 Body Mass Index (BMI) 40.0 and over, adult: Secondary | ICD-10-CM

## 2023-02-24 DIAGNOSIS — I4819 Other persistent atrial fibrillation: Secondary | ICD-10-CM | POA: Diagnosis not present

## 2023-02-24 DIAGNOSIS — Z79899 Other long term (current) drug therapy: Secondary | ICD-10-CM

## 2023-02-24 DIAGNOSIS — Z7982 Long term (current) use of aspirin: Secondary | ICD-10-CM

## 2023-02-24 DIAGNOSIS — Z87891 Personal history of nicotine dependence: Secondary | ICD-10-CM

## 2023-02-24 DIAGNOSIS — W19XXXA Unspecified fall, initial encounter: Secondary | ICD-10-CM | POA: Diagnosis present

## 2023-02-24 DIAGNOSIS — I10 Essential (primary) hypertension: Secondary | ICD-10-CM | POA: Diagnosis present

## 2023-02-24 DIAGNOSIS — I517 Cardiomegaly: Secondary | ICD-10-CM | POA: Diagnosis not present

## 2023-02-24 DIAGNOSIS — Z885 Allergy status to narcotic agent status: Secondary | ICD-10-CM

## 2023-02-24 DIAGNOSIS — M5430 Sciatica, unspecified side: Secondary | ICD-10-CM | POA: Diagnosis present

## 2023-02-24 DIAGNOSIS — E785 Hyperlipidemia, unspecified: Secondary | ICD-10-CM | POA: Diagnosis present

## 2023-02-24 DIAGNOSIS — M1611 Unilateral primary osteoarthritis, right hip: Secondary | ICD-10-CM | POA: Diagnosis present

## 2023-02-24 DIAGNOSIS — M810 Age-related osteoporosis without current pathological fracture: Secondary | ICD-10-CM | POA: Diagnosis present

## 2023-02-24 DIAGNOSIS — I119 Hypertensive heart disease without heart failure: Secondary | ICD-10-CM | POA: Diagnosis present

## 2023-02-24 DIAGNOSIS — M549 Dorsalgia, unspecified: Secondary | ICD-10-CM

## 2023-02-24 DIAGNOSIS — M40209 Unspecified kyphosis, site unspecified: Secondary | ICD-10-CM | POA: Diagnosis not present

## 2023-02-24 DIAGNOSIS — M545 Low back pain, unspecified: Secondary | ICD-10-CM

## 2023-02-24 DIAGNOSIS — E119 Type 2 diabetes mellitus without complications: Secondary | ICD-10-CM

## 2023-02-24 DIAGNOSIS — M4856XA Collapsed vertebra, not elsewhere classified, lumbar region, initial encounter for fracture: Secondary | ICD-10-CM | POA: Diagnosis not present

## 2023-02-24 DIAGNOSIS — Z888 Allergy status to other drugs, medicaments and biological substances status: Secondary | ICD-10-CM

## 2023-02-24 DIAGNOSIS — M47816 Spondylosis without myelopathy or radiculopathy, lumbar region: Secondary | ICD-10-CM | POA: Diagnosis not present

## 2023-02-24 DIAGNOSIS — M160 Bilateral primary osteoarthritis of hip: Secondary | ICD-10-CM | POA: Diagnosis not present

## 2023-02-24 DIAGNOSIS — Z9889 Other specified postprocedural states: Secondary | ICD-10-CM

## 2023-02-24 DIAGNOSIS — D72829 Elevated white blood cell count, unspecified: Secondary | ICD-10-CM

## 2023-02-24 DIAGNOSIS — Z7901 Long term (current) use of anticoagulants: Secondary | ICD-10-CM

## 2023-02-24 DIAGNOSIS — R002 Palpitations: Secondary | ICD-10-CM | POA: Diagnosis not present

## 2023-02-24 DIAGNOSIS — Z7984 Long term (current) use of oral hypoglycemic drugs: Secondary | ICD-10-CM

## 2023-02-24 DIAGNOSIS — Z90710 Acquired absence of both cervix and uterus: Secondary | ICD-10-CM

## 2023-02-24 HISTORY — DX: Unspecified atrial fibrillation: I48.91

## 2023-02-24 HISTORY — DX: Dissection of other specified artery: I77.79

## 2023-02-24 HISTORY — DX: Type 2 diabetes mellitus without complications: E11.9

## 2023-02-24 HISTORY — DX: Morbid (severe) obesity due to excess calories: E66.01

## 2023-02-24 LAB — HEMOGLOBIN A1C
Hgb A1c MFr Bld: 7 % — ABNORMAL HIGH (ref 4.8–5.6)
Mean Plasma Glucose: 154.2 mg/dL

## 2023-02-24 LAB — CBG MONITORING, ED
Glucose-Capillary: 240 mg/dL — ABNORMAL HIGH (ref 70–99)
Glucose-Capillary: 147 mg/dL — ABNORMAL HIGH (ref 70–99)
Glucose-Capillary: 141 mg/dL — ABNORMAL HIGH (ref 70–99)

## 2023-02-24 LAB — CBC WITH DIFFERENTIAL/PLATELET
Abs Immature Granulocytes: 0.08 10*3/uL — ABNORMAL HIGH (ref 0.00–0.07)
Basophils Absolute: 0 10*3/uL (ref 0.0–0.1)
Basophils Relative: 0 %
Eosinophils Absolute: 0 10*3/uL (ref 0.0–0.5)
Eosinophils Relative: 0 %
HCT: 45.7 % (ref 36.0–46.0)
Hemoglobin: 14.1 g/dL (ref 12.0–15.0)
Immature Granulocytes: 1 %
Lymphocytes Relative: 12 %
Lymphs Abs: 1.7 10*3/uL (ref 0.7–4.0)
MCH: 27.6 pg (ref 26.0–34.0)
MCHC: 30.9 g/dL (ref 30.0–36.0)
MCV: 89.4 fL (ref 80.0–100.0)
Monocytes Absolute: 0.7 10*3/uL (ref 0.1–1.0)
Monocytes Relative: 5 %
Neutro Abs: 11.6 10*3/uL — ABNORMAL HIGH (ref 1.7–7.7)
Neutrophils Relative %: 82 %
Platelets: 316 10*3/uL (ref 150–400)
RBC: 5.11 MIL/uL (ref 3.87–5.11)
RDW: 14 % (ref 11.5–15.5)
WBC: 14.2 10*3/uL — ABNORMAL HIGH (ref 4.0–10.5)
nRBC: 0 % (ref 0.0–0.2)

## 2023-02-24 LAB — URINALYSIS, ROUTINE W REFLEX MICROSCOPIC
Bilirubin Urine: NEGATIVE
Glucose, UA: >=500 mg/dL — AB
Hgb urine dipstick: NEGATIVE
Ketones, ur: 5 mg/dL — AB
Leukocytes,Ua: NEGATIVE
Nitrite: NEGATIVE
Protein, ur: NEGATIVE mg/dL
Specific Gravity, Urine: 1.03 (ref 1.005–1.030)
pH: 5 (ref 5.0–8.0)

## 2023-02-24 LAB — BASIC METABOLIC PANEL
Anion gap: 9 (ref 5–15)
BUN: 13 mg/dL (ref 8–23)
CO2: 22 mmol/L (ref 22–32)
Calcium: 8.6 mg/dL — ABNORMAL LOW (ref 8.9–10.3)
Chloride: 104 mmol/L (ref 98–111)
Creatinine, Ser: 0.69 mg/dL (ref 0.44–1.00)
GFR, Estimated: >60 mL/min (ref >60)
Glucose, Bld: 186 mg/dL — ABNORMAL HIGH (ref 70–99)
Potassium: 3.3 mmol/L — ABNORMAL LOW (ref 3.5–5.1)
Sodium: 135 mmol/L (ref 135–145)

## 2023-02-24 LAB — TSH: TSH: 1.054 u[IU]/mL (ref 0.350–4.500)

## 2023-02-24 LAB — BRAIN NATRIURETIC PEPTIDE: B Natriuretic Peptide: 135.6 pg/mL — ABNORMAL HIGH (ref 0.0–100.0)

## 2023-02-24 LAB — HIV ANTIBODY (ROUTINE TESTING W REFLEX): HIV Screen 4th Generation wRfx: NONREACTIVE

## 2023-02-24 LAB — MAGNESIUM: Magnesium: 1.6 mg/dL — ABNORMAL LOW (ref 1.7–2.4)

## 2023-02-24 MED ORDER — DILTIAZEM HCL-DEXTROSE 125-5 MG/125ML-% IV SOLN (PREMIX)
5.0000 mg/h | INTRAVENOUS | Status: DC
  Administered 2023-02-24: 5 mg/h via INTRAVENOUS
  Filled 2023-02-24: qty 125

## 2023-02-24 MED ORDER — ACETAMINOPHEN 325 MG PO TABS
650.0000 mg | ORAL_TABLET | ORAL | Status: DC | PRN
  Administered 2023-03-04 – 2023-03-05 (×2): 650 mg via ORAL
  Filled 2023-02-24 (×2): qty 2

## 2023-02-24 MED ORDER — HYDROMORPHONE HCL 1 MG/ML IJ SOLN
1.0000 mg | Freq: Once | INTRAMUSCULAR | Status: AC
  Administered 2023-02-24: 1 mg via INTRAVENOUS
  Filled 2023-02-24: qty 1

## 2023-02-24 MED ORDER — METOPROLOL SUCCINATE ER 25 MG PO TB24: 75.0000 mg | ORAL_TABLET | Freq: Every day | ORAL | Status: DC

## 2023-02-24 MED ORDER — POTASSIUM CHLORIDE CRYS ER 20 MEQ PO TBCR
40.0000 meq | EXTENDED_RELEASE_TABLET | Freq: Once | ORAL | Status: AC
  Administered 2023-02-24: 40 meq via ORAL
  Filled 2023-02-24: qty 2

## 2023-02-24 MED ORDER — LIDOCAINE 5 % EX PTCH
1.0000 | MEDICATED_PATCH | Freq: Every day | CUTANEOUS | Status: DC
  Administered 2023-02-24 – 2023-03-05 (×9): 1 via TRANSDERMAL
  Filled 2023-02-24 (×9): qty 1

## 2023-02-24 MED ORDER — MORPHINE SULFATE (PF) 2 MG/ML IV SOLN
2.0000 mg | INTRAVENOUS | Status: DC | PRN
  Administered 2023-02-24 – 2023-02-25 (×2): 2 mg via INTRAVENOUS
  Filled 2023-02-24 (×2): qty 1

## 2023-02-24 MED ORDER — DILTIAZEM HCL-DEXTROSE 125-5 MG/125ML-% IV SOLN (PREMIX)
5.0000 mg/h | INTRAVENOUS | Status: DC
Start: 2023-02-24 — End: 2023-02-24

## 2023-02-24 MED ORDER — METOPROLOL TARTRATE 25 MG PO TABS: 25.0000 mg | ORAL_TABLET | Freq: Two times a day (BID) | ORAL | Status: DC

## 2023-02-24 MED ORDER — ONDANSETRON HCL 4 MG/2ML IJ SOLN: 4.0000 mg | Freq: Four times a day (QID) | INTRAMUSCULAR | Status: DC | PRN

## 2023-02-24 MED ORDER — MORPHINE SULFATE (PF) 2 MG/ML IV SOLN: 2.0000 mg | Freq: Once | INTRAVENOUS | Status: DC

## 2023-02-24 MED ORDER — ASPIRIN 81 MG PO TBEC
81.0000 mg | DELAYED_RELEASE_TABLET | Freq: Every day | ORAL | Status: DC
  Administered 2023-02-25 – 2023-02-26 (×2): 81 mg via ORAL
  Filled 2023-02-24 (×2): qty 1

## 2023-02-24 MED ORDER — METOPROLOL SUCCINATE ER 25 MG PO TB24
25.0000 mg | ORAL_TABLET | Freq: Once | ORAL | Status: DC
  Filled 2023-02-24: qty 1

## 2023-02-24 MED ORDER — ONDANSETRON HCL 4 MG/2ML IJ SOLN
4.0000 mg | Freq: Once | INTRAMUSCULAR | Status: AC
  Administered 2023-02-24: 4 mg via INTRAVENOUS
  Filled 2023-02-24: qty 2

## 2023-02-24 MED ORDER — HYDROCODONE-ACETAMINOPHEN 5-325 MG PO TABS
1.0000 | ORAL_TABLET | Freq: Four times a day (QID) | ORAL | Status: DC | PRN
  Administered 2023-02-25 – 2023-02-26 (×6): 2 via ORAL
  Administered 2023-02-26: 1 via ORAL
  Administered 2023-02-28 – 2023-03-03 (×11): 2 via ORAL
  Administered 2023-03-04: 1 via ORAL
  Administered 2023-03-04 – 2023-03-05 (×5): 2 via ORAL
  Filled 2023-02-24 (×26): qty 2

## 2023-02-24 MED ORDER — FENTANYL CITRATE PF 50 MCG/ML IJ SOSY
100.0000 ug | PREFILLED_SYRINGE | Freq: Once | INTRAMUSCULAR | Status: AC
  Administered 2023-02-24: 100 ug via INTRAVENOUS
  Filled 2023-02-24: qty 2

## 2023-02-24 MED ORDER — INSULIN ASPART 100 UNIT/ML IJ SOLN
0.0000 [IU] | Freq: Three times a day (TID) | INTRAMUSCULAR | Status: DC
  Administered 2023-02-24: 2 [IU] via SUBCUTANEOUS
  Administered 2023-02-24: 5 [IU] via SUBCUTANEOUS
  Administered 2023-02-24: 2 [IU] via SUBCUTANEOUS

## 2023-02-24 MED ORDER — PREDNISONE 10 MG PO TABS
10.0000 mg | ORAL_TABLET | Freq: Every day | ORAL | Status: AC
  Administered 2023-02-25 – 2023-02-27 (×3): 10 mg via ORAL
  Filled 2023-02-24 (×2): qty 1
  Filled 2023-02-24: qty 2

## 2023-02-24 MED ORDER — KETOROLAC TROMETHAMINE 15 MG/ML IJ SOLN
15.0000 mg | Freq: Once | INTRAMUSCULAR | Status: AC
  Administered 2023-02-24: 15 mg via INTRAVENOUS
  Filled 2023-02-24: qty 1

## 2023-02-24 MED ORDER — APIXABAN 5 MG PO TABS
5.0000 mg | ORAL_TABLET | Freq: Two times a day (BID) | ORAL | Status: DC
Start: 2023-02-24 — End: 2023-02-24

## 2023-02-24 MED ORDER — ENOXAPARIN SODIUM 40 MG/0.4ML IJ SOSY
40.0000 mg | PREFILLED_SYRINGE | Freq: Every day | INTRAMUSCULAR | Status: DC
  Filled 2023-02-24: qty 0.4

## 2023-02-24 MED ORDER — LOSARTAN POTASSIUM 50 MG PO TABS
100.0000 mg | ORAL_TABLET | Freq: Every day | ORAL | Status: DC
  Administered 2023-02-25 – 2023-03-05 (×9): 100 mg via ORAL
  Filled 2023-02-24 (×9): qty 2

## 2023-02-24 MED ORDER — CYCLOBENZAPRINE HCL 10 MG PO TABS
5.0000 mg | ORAL_TABLET | Freq: Three times a day (TID) | ORAL | Status: DC | PRN
  Administered 2023-02-25 – 2023-03-05 (×19): 5 mg via ORAL
  Filled 2023-02-24 (×19): qty 1

## 2023-02-24 MED ORDER — EMPAGLIFLOZIN 25 MG PO TABS
25.0000 mg | ORAL_TABLET | Freq: Every day | ORAL | Status: DC
  Administered 2023-02-25 – 2023-03-05 (×9): 25 mg via ORAL
  Filled 2023-02-24 (×9): qty 1

## 2023-02-24 NOTE — ED Provider Notes (Signed)
[place] EMERGENCY DEPARTMENT AT [facility] Provider Note   CSN: [id number] Arrival date & time: 02/24/23  6962     History  Chief Complaint  Patient presents with   Spasms    Tracie [name] is a 62 y.o. female.  The history is provided by the patient and the spouse.  Patient history of obesity, hypertension, hyperlipidemia, atrial fibrillation presents with back pain. Patient reports earlier this month  her back "gave out " while she was assisting her husband.  She was bending over at the time.  No falls or trauma. Since that time she has had pain in the low back that is not improving.  She was seen in the ER on November 14 and had some relief at that time but since then her pain is worsening.  No fevers or vomiting.  No chest pain or shortness of breath.  No abdominal pain.  Denies palpitations.  No new leg weakness or incontinence.  No previous spinal surgery   Past Medical History:  Diagnosis Date   History of DVT (deep vein thrombosis) 2010   Hyperlipidemia    Hypertension     Home Medications Prior to Admission medications   Medication Sig Start Date End Date Taking? Authorizing Provider  amLODipine (NORVASC) 10 MG tablet Take 1 tablet (10 mg total) by mouth daily. 02/26/20   Shade Flood, MD  HYDROcodone-acetaminophen (NORCO/VICODIN) 5-325 MG tablet Take 1 tablet by mouth every 6 (six) hours as needed for severe pain (pain score 7-10). 02/22/23   Smoot, Shawn Route, PA-C  ibuprofen (ADVIL) 800 MG tablet Take 1 tablet (800 mg total) by mouth 3 (three) times daily. 02/21/23   Smoot, Sarah A, PA-C  lidocaine (LIDODERM) 5 % Place 1 patch onto the skin daily. Remove & Discard patch within 12 hours or as directed by MD 02/22/23   Smoot, Shawn Route, PA-C  losartan (COZAAR) 100 MG tablet TAKE 1 TABLET(100 MG) BY MOUTH DAILY 02/26/20   Shade Flood, MD  metoprolol tartrate (LOPRESSOR) 50 MG tablet TAKE 1 TABLET(50 MG) BY MOUTH TWICE DAILY 06/17/20   Shade Flood, MD      Allergies    Codeine    Review of Systems   Review of Systems  Respiratory:  Negative for shortness of breath.   Cardiovascular:  Negative for chest pain.  Musculoskeletal:  Positive for back pain.    Physical Exam Updated Vital Signs BP 124/89   Pulse 96   Temp 98 F (36.7 C) (Oral)   Resp 16   Ht 1.791 m (5' 10.51")   Wt (!) 144.7 kg   SpO2 94%   BMI 45.11 kg/m  Physical Exam CONSTITUTIONAL: Well developed/well nourished, anxious HEAD: Normocephalic/atraumatic EYES: EOMI/PERRL ENMT: Mucous membranes moist NECK: supple no meningeal signs SPINE/BACK diffuse lumbar spinal and paraspinal tenderness No bruising/crepitance/stepoffs noted to spine CV: tachycardic and irregular LUNGS: Lungs are clear to auscultation bilaterally, no apparent distress ABDOMEN: soft, nontender, no rebound or guarding NEURO: Awake/alert, hip flexion limited on the right due to pain.  Ankle dorsi and plantarflexion is equal on both sides  great toe extension intact bilaterally, no clonus bilaterally,no sensory deficit in any dermatome.   EXTREMITIES: pulses normal, full ROM SKIN: warm, color normal PSYCH: Anxious  ED Results / Procedures / Treatments   Labs (all labs ordered are listed, but only abnormal results are displayed) Labs Reviewed  CBC WITH DIFFERENTIAL/PLATELET - Abnormal; Notable for the following components:  Result Value   WBC 14.2 (*)    Neutro Abs 11.6 (*)    Abs Immature Granulocytes 0.08 (*)    All other components within normal limits  BASIC METABOLIC PANEL - Abnormal; Notable for the following components:   Potassium 3.3 (*)    Glucose, Bld 186 (*)    Calcium 8.6 (*)    All other components within normal limits  URINALYSIS, ROUTINE W REFLEX MICROSCOPIC - Abnormal; Notable for the following components:   Glucose, UA >=500 (*)    Ketones, ur 5 (*)    Bacteria, UA RARE (*)    All other components within normal limits  MAGNESIUM  TSH     EKG EKG Interpretation Date/Time:  Sunday February 24 2023 04:40:46 EST Ventricular Rate:  122 PR Interval:    QRS Duration:  87 QT Interval:  312 QTC Calculation: 445 R Axis:   6  Text Interpretation: Atrial fibrillation Ventricular premature complex Borderline repolarization abnormality Confirmed by Zadie Rhine ([id number]) on 02/24/2023 5:00:32 AM  Radiology DG Chest Portable 1 View  Result Date: 02/24/2023 CLINICAL DATA:  Heart palpitations. EXAM: PORTABLE CHEST 1 VIEW COMPARISON:  PA chest 01/04/2021 FINDINGS: There is moderate to severe cardiomegaly increased from 01/04/2009. Central vessels are normal caliber. There is no overt edema. The lungs are clear. There is no substantial pleural effusion. The mediastinum is stable. Thoracic cage is intact. IMPRESSION: Moderate to severe cardiomegaly increased from 01/04/2009. No overt edema or pleural effusion. Normal caliber vascular markings. Electronically Signed   By: Almira Bar M.D.   On: 02/24/2023 06:02    Procedures .Critical Care  Performed by: Zadie Rhine, MD Authorized by: Zadie Rhine, MD   Critical care provider statement:    Critical care time (minutes):  37   Critical care start time:  02/24/2023 6:00 AM   Critical care end time:  02/24/2023 6:37 AM   Critical care time was exclusive of:  Separately billable procedures and treating other patients   Critical care was necessary to treat or prevent imminent or life-threatening deterioration of the following conditions:  Cardiac failure   Critical care was time spent personally by me on the following activities:  Examination of patient, development of treatment plan with patient or surrogate, pulse oximetry, ordering and review of radiographic studies, ordering and review of laboratory studies, ordering and performing treatments and interventions, re-evaluation of patient's condition and evaluation of patient's response to treatment   I assumed direction of  critical care for this patient from another provider in my specialty: no       Medications Ordered in ED Medications  diltiazem (CARDIZEM) 125 mg in dextrose 5% 125 mL (1 mg/mL) infusion (5 mg/hr Intravenous New Bag/Given 02/24/23 0652)  ketorolac (TORADOL) 15 MG/ML injection 15 mg (has no administration in time range)  fentaNYL (SUBLIMAZE) injection 100 mcg (100 mcg Intravenous Given 02/24/23 0551)  ondansetron (ZOFRAN) injection 4 mg (4 mg Intravenous Given 02/24/23 0550)    ED Course/ Medical Decision Making/ A&P Clinical Course as of 02/24/23 0736  Sun Feb 24, 2023  3086 Patient presents for recurrent low back pain.  She has significant pain with any movement.  There is some limitation with range of motion of the right hip due to pain.  Patient also noted to be in A-fib with RVR.  She reports she has not been on anticoagulation for some time.  Patient appears to have poor insight into her illnesses. Patient also appears to be mildly tachypneic but denies any  chest complaints.  Will add on chest x-ray, treat pain and reassess. [DW]  [phone] Patient reports some improvement in her pain, but reports she is unable to lay flat for any imaging  Patient is also having runs of atrial fibrillation with RVR with heart rate in the 130s.  She was unaware of this and has not seen cardiology in quite some time [DW]  [phone] Plan will be to start on a Cardizem drip, admit for uncontrolled A-fib RVR and back pain [DW]  0735 D/w dr Sheppard Penton with [facility] for admission  [DW]    Clinical Course User Index [DW] Zadie Rhine, MD          [id number] Score: 3                        Medical Decision Making Amount and/or Complexity of Data Reviewed Labs: ordered. Radiology: ordered.  Risk Prescription drug management. Decision regarding hospitalization.   This patient presents to the ED for concern of back pain, this involves an extensive number of treatment options, and is a complaint that carries with  it a high risk of complications and morbidity.  The differential diagnosis includes but is not limited to muscle strain, epidural abscess, discitis, lumbosacral radiculopathy, AAA, pyelonephritis  Comorbidities that complicate the patient evaluation: Patient's presentation is complicated by their history of atrial fibrillation and obesity  Social Determinants of Health: Patient's  poor health literacy   increases the complexity of managing their presentation  Additional history obtained: Additional history obtained from spouse Records reviewed  outpatient records reviewed  Lab Tests: I Ordered, and personally interpreted labs.  The pertinent results include:  leukocytosis  Imaging Studies ordered: I ordered imaging studies including X-ray chest   I independently visualized and interpreted imaging which showed cardiomegaly I agree with the radiologist interpretation  Cardiac Monitoring: The patient was maintained on a cardiac monitor.  I personally viewed and interpreted the cardiac monitor which showed an underlying rhythm of:  Atrial Fibrillation  Medicines ordered and prescription drug management: I ordered medication including fentanyl for pain Reevaluation of the patient after these medicines showed that the patient    improved   Critical Interventions:   Cardizem for A-fib management  Consultations Obtained: I requested consultation with the admitting physician [facility] , and discussed  findings as well as pertinent plan - they recommend: admit  Reevaluation: After the interventions noted above, I reevaluated the patient and found that they have :improved  Complexity of problems addressed: Patient's presentation is most consistent with  acute presentation with potential threat to life or bodily function  Disposition: After consideration of the diagnostic results and the patient's response to treatment,  I feel that the patent would benefit from admission   .            Final Clinical Impression(s) / ED Diagnoses Final diagnoses:  Atrial fibrillation with rapid ventricular response (HCC)  Intractable back pain    Rx / DC Orders ED Discharge Orders     None         Zadie Rhine, MD 02/24/23 [phone]

## 2023-02-24 NOTE — Assessment & Plan Note (Signed)
On no statin Lipid panel pending

## 2023-02-24 NOTE — ED Notes (Signed)
Patient transported to X-ray 

## 2023-02-24 NOTE — Assessment & Plan Note (Addendum)
Significant pain on exam over lumbar spine/paraspinal muscles with radiation down right leg. No tingling/numbness/burning.  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 I do wonder if this is contributing to her leg, balance and back pain from compensating. She has no desire for surgery.  She has no red flags on exam with her back or by history including urinary incontinence, saddle paraesthesias or even radicular complaints. She is weaker on the right, but again, I wonder how much her hip plays into this and this has been a chronic issue.  -will f/u on lumbar xrays.  -Discussed MRI with patient, but she doesn't even want PT.  -continue with lidocaine patch, muscle relaxer, norco -she has no radicular symptoms so will hold on gabapentin  -declines heating pad -seems very much against PT, but discussed this is first line for sciatica and MSK back pain. Will order in hospital and see if she will be more comfortable with this approach at home.  -I feel like making progress will be limited due to her lack of willingness for any treatment -on day 9/12 of steroid taper, will finish this out

## 2023-02-24 NOTE — H&P (Signed)
History and Physical    Patient: Tracie [name] RXV:[id number] DOB: 08/08/1960 DOA: 02/24/2023 DOS: the patient was seen and examined on 02/24/2023 PCP: Aliene Beams, MD  Patient coming from: Home - lives with her husband. Typically uses cane but has had to use walker due to back pain.    Chief Complaint: back pain   HPI: Tracie [name] is a 62 y.o. female with medical history significant of HTN, HLD, T2DM, atrial fibrillation hx of DVT, myalgias who presented to ED with complaints of back pain, but found to be in afib with RVR. She was seen in ED on 11/14 for back pain. Exam at that time significant for TTP over paraspinal muscles. Diagnosed with sciatica and sent home with some medication. She states on 11/3 she was helping her husband in the bathroom and her back "gave out." She went to an [facility] walk in and got steroids. This helped to ease the pain. She then saw her PCP and she was given a muscle relaxer and toradol and put on steroid taper. It felt like it was getting better, but then came back worse. Pain is over entire lower back and radiates down the right leg. She has no burning or tingling down the right leg, just throbs. She has no saddle paraesthesias or urinary incontinence. She does feel like her right leg is weak, but this is chronic. Pain is constant in nature. Pain is rated as 10/10. She had a back injury years ago and struggles with her back. She had no precipitating events this time. 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   Hx of atrial fib in 2021. Seen by cardiology. Started on eliquis 5mg  BID at that time for a CHA2DS2-VASC score of 3. Declined echo or further work up at that time. Cardiology recommended cardioversion and getting rate controlled, but they did not want anything invasive at that time. She is no longer anti coagulated and lost to cardiology follow up.   She denies any shortness of breath, chest pain, palpitations or coughing. She does have swelling in  her legs. She denies any weight gain. She denies any orthopnea.   She does not smoke or drink alcohol.   ER Course:  vitals: afebrile, bp 141/96, HR: 103, RR: 18, oxygen: 96% RA Pertinent labs: wbc: 14.2, potassium: 3.3, >500 glucose, ketones 5 CXR: moderate to severe cardiomegaly increased from 12/2008. No overt edema or pleural effusion.  In ED: started on cardizem gtt and given toradol, zofran and fentanyl. [facility] asked to admit.    Review of Systems: As mentioned in the history of present illness. All other systems reviewed and are negative. Past Medical History:  Diagnosis Date   History of DVT (deep vein thrombosis) 2010   Hyperlipidemia    Hypertension    Past Surgical History:  Procedure Laterality Date   ABDOMINAL HYSTERECTOMY     FRACTURE SURGERY Left    hand   KNEE ARTHROSCOPY Right 1990s   Social History:  reports that she quit smoking about 14 years ago. Her smoking use included cigarettes. She started smoking about 34 years ago. She has a 20 pack-year smoking history. She has never used smokeless tobacco. She reports that she does not currently use drugs. She reports that she does not drink alcohol.  Allergies  Allergen Reactions   Codeine Other (See Comments)    Unknown reaction   Crestor [Rosuvastatin] Other (See Comments)    Myalgias  Weakness    Family History  Problem Relation Age of Onset   Hypertension Mother    Heart attack Father    Heart disease Brother    Healthy Son    Breast cancer Neg Hx     Prior to Admission medications   Medication Sig Start Date End Date Taking? Authorizing Provider  amLODipine (NORVASC) 10 MG tablet Take 1 tablet (10 mg total) by mouth daily. 02/26/20   Shade Flood, MD  HYDROcodone-acetaminophen (NORCO/VICODIN) 5-325 MG tablet Take 1 tablet by mouth every 6 (six) hours as needed for severe pain (pain score 7-10). 02/22/23   Smoot, Shawn Route, PA-C  ibuprofen (ADVIL) 800 MG tablet Take 1 tablet (800 mg total) by mouth 3  (three) times daily. 02/21/23   Smoot, Sarah A, PA-C  lidocaine (LIDODERM) 5 % Place 1 patch onto the skin daily. Remove & Discard patch within 12 hours or as directed by MD 02/22/23   Smoot, Shawn Route, PA-C  losartan (COZAAR) 100 MG tablet TAKE 1 TABLET(100 MG) BY MOUTH DAILY 02/26/20   Shade Flood, MD  metoprolol tartrate (LOPRESSOR) 50 MG tablet TAKE 1 TABLET(50 MG) BY MOUTH TWICE DAILY 06/17/20   Shade Flood, MD    Physical Exam: Vitals:   02/24/23 0700 02/24/23 0715 02/24/23 0831 02/24/23 0842  BP: 124/88 124/89 127/85   Pulse: (!) 111 96    Resp: (!) 24 16    Temp:    98 F (36.7 C)  TempSrc:      SpO2: 95% 94%    Weight:      Height:       General:  Appears calm and comfortable and is in NAD. Obese  Eyes:  PERRL, EOMI, normal lids, iris ENT:  grossly normal hearing, lips & tongue, mmm; appropriate dentition Neck:  no LAD, masses or thyromegaly; no carotid bruits Cardiovascular:  irregularly, irregular no m/r/g. 1+ LE edema.  Respiratory:   faint crackles in bases, otherwise clear exam. No wheezing. Normal effort.  Abdomen:  soft, NT, ND, NABS Back:   TTP over entire lower back.L2-L5.  Skin:  no rash or induration seen on limited exam Musculoskeletal:  grossly normal tone BUE/LLE. RLE 4/5 due to pain. Could not do straight leg test as she would not lie flat, but fine lifting leg sitting up.  Lower extremity:  Limited foot exam with no ulcerations.  2+ distal pulses. Psychiatric:  grossly normal mood and affect, speech fluent and appropriate, AOx3 Neurologic:  CN 2-12 grossly intact, moves all extremities in coordinated fashion, sensation intact   Radiological Exams on Admission: Independently reviewed - see discussion in A/P where applicable  DG Chest Portable 1 View  Result Date: 02/24/2023 CLINICAL DATA:  Heart palpitations. EXAM: PORTABLE CHEST 1 VIEW COMPARISON:  PA chest 01/04/2021 FINDINGS: There is moderate to severe cardiomegaly increased from  01/04/2009. Central vessels are normal caliber. There is no overt edema. The lungs are clear. There is no substantial pleural effusion. The mediastinum is stable. Thoracic cage is intact. IMPRESSION: Moderate to severe cardiomegaly increased from 01/04/2009. No overt edema or pleural effusion. Normal caliber vascular markings. Electronically Signed   By: Almira Bar M.D.   On: 02/24/2023 06:02    EKG: Independently reviewed.  Atrial fib  with rate 122; nonspecific ST changes with no evidence of acute ischemia PVC    Labs on Admission: I have personally reviewed the available labs and imaging studies at the time of the admission.  Pertinent labs:   wbc: 14.2,  potassium: 3.3, UA  >  500 glucose, ketones 5  Assessment and Plan: Principal Problem:   Atrial fibrillation with RVR (HCC) Active Problems:   Lumbar pain   Hypokalemia   Leukocytosis   Controlled type 2 diabetes mellitus without complication, without long-term current use of insulin (HCC)   Essential hypertension, benign   Hyperlipidemia    Assessment and Plan: * Atrial fibrillation with RVR (HCC) 62 year old presenting to ED with complaints of back pain found to be in atrial fib with RVR with rates to the 130-140s.  -obs to progressive  -hx of atrial fibrillation in 2021 with cardiology follow up.  Declined anything that would "mess with heart." She declined echo or cardioversion. Cardiology stressed importance of rate control and she again declined cardioversion. Was on metoprolol but tells me today she will not take this ever again. (Clarified, not okay with metoprolol tartrate, but she is taking her metoprolol succinate)  -She is not anticoagulated and stopped the eliquis back in 2021. Again, I discussed in length her CHA2Ds2-vasc score of at least 3 and recommendation for anti-coagulation due to stroke risk of 3.2% per year and 4.6% risk of cva/tia/systemic embolism. She again declined. I re-iteritated multiple times and  she does not want this.  -she is currently on cardizem gtt and is okay with this for now. Again, I discussed my hesitation as I would like to get her echo back with such an enlarged heart on xray and wanting to transition to oral toprolol.  She again told me no and will never take this drug. After clarifying medication she is on metoprolol succinate. Will increase dose to 75mg  daily. Give additional 25mg  today for 75mg  and turn off cardizem not that her rate is in the 90s.  -echo pending  -check magnesium/TSH -She has said multiple times she wants no one to mess with her heart and doesn't want to see a cardiologist. I suggested if she is against all medical therapy to help improve this then needs to see palliative care.   Lumbar pain Significant pain on exam over lumbar spine/paraspinal muscles with radiation down right leg. No tingling/numbness/burning.  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 I do wonder if this is contributing to her leg, balance and back pain from compensating. She has no desire for surgery.  She has no red flags on exam with her back or by history including urinary incontinence, saddle paraesthesias or even radicular complaints. She is weaker on the right, but again, I wonder how much her hip plays into this and this has been a chronic issue.  -will f/u on lumbar xrays.  -Discussed MRI with patient, but she doesn't even want PT.  -continue with lidocaine patch, muscle relaxer, norco -she has no radicular symptoms so will hold on gabapentin  -declines heating pad -seems very much against PT, but discussed this is first line for sciatica and MSK back pain. Will order in hospital and see if she will be more comfortable with this approach at home.  -I feel like making progress will be limited due to her lack of willingness for any treatment -on day 9/12 of steroid taper, will finish this out  Hypokalemia Replete, check magnesium Trend    Leukocytosis Likely steroid induced No fever or signs/symptoms of infection Continue to trend  Controlled type 2 diabetes mellitus without complication, without long-term current use of insulin (HCC) Repeat A1C pending  Hold glipizide, continue jardiance  SSI and accuchecks QAC/HS  Essential hypertension, benign Hold her cozaar and norvasc while on cardizem gtt May need change in therapy depending on echo   Hyperlipidemia On no statin Lipid panel pending     Advance Care Planning:   Code Status: Full Code   Consults: PT   DVT Prophylaxis:lovenox    Family Communication: husband at bedside   Severity of Illness: The appropriate patient status for this patient is OBSERVATION. Observation status is judged to be reasonable and necessary in order to provide the required intensity of service to ensure the patient's safety. The patient's presenting symptoms, physical exam findings, and initial radiographic and laboratory data in the context of their medical condition is felt to place them at decreased risk for further clinical deterioration. Furthermore, it is anticipated that the patient will be medically stable for discharge from the hospital within 2 midnights of admission.   Author: Orland Mustard, MD 02/24/2023 9:50 AM  For on call review [email or web address].

## 2023-02-24 NOTE — ED Triage Notes (Signed)
Patient C/O muscle spasms to lower right back and down her right leg, patient has been experiencing these issues since 02/10/23

## 2023-02-24 NOTE — ED Notes (Signed)
Patient transported to MRI 

## 2023-02-24 NOTE — ED Notes (Signed)
Pt refusing MRI at this time. RN reached out to MD and MD suggested to give dilaudid for pain.

## 2023-02-24 NOTE — Assessment & Plan Note (Signed)
Hold her cozaar and norvasc while on cardizem gtt May need change in therapy depending on echo

## 2023-02-24 NOTE — Assessment & Plan Note (Signed)
Repeat A1C pending  Hold glipizide, continue jardiance  SSI and accuchecks QAC/HS

## 2023-02-24 NOTE — Assessment & Plan Note (Signed)
Replete, check magnesium Trend

## 2023-02-24 NOTE — Assessment & Plan Note (Addendum)
62 year old presenting to ED with complaints of back pain found to be in atrial fib with RVR with rates to the 130-140s.  -obs to progressive  -hx of atrial fibrillation in 2021 with cardiology follow up.  Declined anything that would "mess with heart." She declined echo or cardioversion. Cardiology stressed importance of rate control and she again declined cardioversion. Was on metoprolol but tells me today she will not take this ever again. (Clarified, not okay with metoprolol tartrate, but she is taking her metoprolol succinate)  -She is not anticoagulated and stopped the eliquis back in 2021. Again, I discussed in length her CHA2Ds2-vasc score of at least 3 and recommendation for anti-coagulation due to stroke risk of 3.2% per year and 4.6% risk of cva/tia/systemic embolism. She again declined. I re-iteritated multiple times and she does not want this.  -she is currently on cardizem gtt and is okay with this for now. Again, I discussed my hesitation as I would like to get her echo back with such an enlarged heart on xray and wanting to transition to oral toprolol.  She again told me no and will never take this drug. After clarifying medication she is on metoprolol succinate. Will increase dose to 75mg  daily. Give additional 25mg  today for 75mg  and turn off cardizem not that her rate is in the 90s.  -echo pending  -check magnesium/TSH -She has said multiple times she wants no one to mess with her heart and doesn't want to see a cardiologist. I suggested if she is against all medical therapy to help improve this then needs to see palliative care.

## 2023-02-24 NOTE — Assessment & Plan Note (Addendum)
Likely steroid induced No fever or signs/symptoms of infection Continue to trend

## 2023-02-24 NOTE — ED Notes (Signed)
Patient returned from MRI, unable to complete scan due to pain despite medication before going. Will notify MD.

## 2023-02-25 ENCOUNTER — Encounter (HOSPITAL_COMMUNITY): Payer: Self-pay | Admitting: Family Medicine

## 2023-02-25 ENCOUNTER — Observation Stay (HOSPITAL_COMMUNITY): Payer: PPO

## 2023-02-25 ENCOUNTER — Other Ambulatory Visit (HOSPITAL_COMMUNITY): Payer: Self-pay

## 2023-02-25 DIAGNOSIS — S32010A Wedge compression fracture of first lumbar vertebra, initial encounter for closed fracture: Secondary | ICD-10-CM | POA: Diagnosis not present

## 2023-02-25 DIAGNOSIS — D72825 Bandemia: Secondary | ICD-10-CM | POA: Diagnosis not present

## 2023-02-25 DIAGNOSIS — I4891 Unspecified atrial fibrillation: Secondary | ICD-10-CM | POA: Diagnosis not present

## 2023-02-25 DIAGNOSIS — M47816 Spondylosis without myelopathy or radiculopathy, lumbar region: Secondary | ICD-10-CM | POA: Diagnosis not present

## 2023-02-25 DIAGNOSIS — M5125 Other intervertebral disc displacement, thoracolumbar region: Secondary | ICD-10-CM | POA: Diagnosis not present

## 2023-02-25 DIAGNOSIS — E876 Hypokalemia: Secondary | ICD-10-CM

## 2023-02-25 DIAGNOSIS — R29890 Loss of height: Secondary | ICD-10-CM | POA: Diagnosis not present

## 2023-02-25 DIAGNOSIS — M549 Dorsalgia, unspecified: Secondary | ICD-10-CM

## 2023-02-25 DIAGNOSIS — M4856XA Collapsed vertebra, not elsewhere classified, lumbar region, initial encounter for fracture: Secondary | ICD-10-CM | POA: Diagnosis not present

## 2023-02-25 LAB — ECHOCARDIOGRAM COMPLETE
AR max vel: 3.31 cm2
AV Area VTI: 3.21 cm2
AV Area mean vel: 3.07 cm2
AV Mean grad: 2 mm[Hg]
AV Peak grad: 4.1 mm[Hg]
Ao pk vel: 1.01 m/s
Area-P 1/2: 5.5 cm2
Height: 70.512 in
S' Lateral: 2.9 cm
Weight: 5103.74 [oz_av]

## 2023-02-25 LAB — LIPID PANEL
Cholesterol: 143 mg/dL (ref 0–200)
HDL: 47 mg/dL (ref >40)
LDL Cholesterol: 80 mg/dL (ref 0–99)
Total CHOL/HDL Ratio: 3 {ratio}
Triglycerides: 79 mg/dL (ref <150)
VLDL: 16 mg/dL (ref 0–40)

## 2023-02-25 LAB — BASIC METABOLIC PANEL
Anion gap: 7 (ref 5–15)
BUN: 12 mg/dL (ref 8–23)
CO2: 25 mmol/L (ref 22–32)
Calcium: 8.7 mg/dL — ABNORMAL LOW (ref 8.9–10.3)
Chloride: 106 mmol/L (ref 98–111)
Creatinine, Ser: 0.7 mg/dL (ref 0.44–1.00)
GFR, Estimated: >60 mL/min (ref >60)
Glucose, Bld: 175 mg/dL — ABNORMAL HIGH (ref 70–99)
Potassium: 3.2 mmol/L — ABNORMAL LOW (ref 3.5–5.1)
Sodium: 138 mmol/L (ref 135–145)

## 2023-02-25 LAB — GLUCOSE, CAPILLARY
Glucose-Capillary: 136 mg/dL — ABNORMAL HIGH (ref 70–99)
Glucose-Capillary: 116 mg/dL — ABNORMAL HIGH (ref 70–99)

## 2023-02-25 LAB — CBC
HCT: 43.7 % (ref 36.0–46.0)
Hemoglobin: 13.5 g/dL (ref 12.0–15.0)
MCH: 27.8 pg (ref 26.0–34.0)
MCHC: 30.9 g/dL (ref 30.0–36.0)
MCV: 90.1 fL (ref 80.0–100.0)
Platelets: 291 10*3/uL (ref 150–400)
RBC: 4.85 MIL/uL (ref 3.87–5.11)
RDW: 14.2 % (ref 11.5–15.5)
WBC: 13.7 10*3/uL — ABNORMAL HIGH (ref 4.0–10.5)
nRBC: 0 % (ref 0.0–0.2)

## 2023-02-25 LAB — CBG MONITORING, ED
Glucose-Capillary: 180 mg/dL — ABNORMAL HIGH (ref 70–99)
Glucose-Capillary: 193 mg/dL — ABNORMAL HIGH (ref 70–99)
Glucose-Capillary: 93 mg/dL (ref 70–99)

## 2023-02-25 MED ORDER — INSULIN DETEMIR 100 UNIT/ML ~~LOC~~ SOLN
5.0000 [IU] | Freq: Every day | SUBCUTANEOUS | Status: DC
  Administered 2023-02-25 – 2023-03-04 (×8): 5 [IU] via SUBCUTANEOUS
  Filled 2023-02-25 (×11): qty 0.05

## 2023-02-25 MED ORDER — POTASSIUM CHLORIDE CRYS ER 20 MEQ PO TBCR
40.0000 meq | EXTENDED_RELEASE_TABLET | Freq: Two times a day (BID) | ORAL | Status: AC
  Administered 2023-02-25 (×2): 40 meq via ORAL
  Filled 2023-02-25 (×2): qty 2

## 2023-02-25 MED ORDER — MORPHINE SULFATE (PF) 4 MG/ML IV SOLN
4.0000 mg | INTRAVENOUS | Status: DC | PRN
  Administered 2023-02-26 – 2023-02-27 (×5): 4 mg via INTRAVENOUS
  Filled 2023-02-25 (×5): qty 1

## 2023-02-25 MED ORDER — PERFLUTREN LIPID MICROSPHERE
1.0000 mL | INTRAVENOUS | Status: AC | PRN
  Administered 2023-02-25: 5 mL via INTRAVENOUS
  Filled 2023-02-25: qty 10

## 2023-02-25 MED ORDER — INSULIN ASPART 100 UNIT/ML IJ SOLN
4.0000 [IU] | Freq: Three times a day (TID) | INTRAMUSCULAR | Status: DC
  Administered 2023-02-25 – 2023-03-05 (×15): 4 [IU] via SUBCUTANEOUS

## 2023-02-25 MED ORDER — METOPROLOL TARTRATE 100 MG PO TABS
100.0000 mg | ORAL_TABLET | Freq: Two times a day (BID) | ORAL | Status: DC
  Administered 2023-02-25 – 2023-02-26 (×3): 100 mg via ORAL
  Filled 2023-02-25: qty 1
  Filled 2023-02-25: qty 4
  Filled 2023-02-25: qty 1

## 2023-02-25 MED ORDER — METOPROLOL TARTRATE 5 MG/5ML IV SOLN: 5.0000 mg | Freq: Four times a day (QID) | INTRAVENOUS | Status: DC | PRN

## 2023-02-25 MED ORDER — INSULIN ASPART 100 UNIT/ML IJ SOLN: 0.0000 [IU] | Freq: Every day | INTRAMUSCULAR | Status: DC

## 2023-02-25 MED ORDER — MORPHINE SULFATE (PF) 4 MG/ML IV SOLN
4.0000 mg | Freq: Once | INTRAVENOUS | Status: AC
  Administered 2023-02-25: 4 mg via INTRAVENOUS
  Filled 2023-02-25 (×2): qty 1

## 2023-02-25 MED ORDER — METOPROLOL TARTRATE 25 MG PO TABS
12.5000 mg | ORAL_TABLET | ORAL | Status: AC
  Administered 2023-02-25: 12.5 mg via ORAL
  Filled 2023-02-25: qty 1

## 2023-02-25 MED ORDER — KETOROLAC TROMETHAMINE 15 MG/ML IJ SOLN
15.0000 mg | Freq: Four times a day (QID) | INTRAMUSCULAR | Status: AC | PRN
  Administered 2023-02-25 – 2023-02-27 (×4): 15 mg via INTRAVENOUS
  Filled 2023-02-25 (×4): qty 1

## 2023-02-25 MED ORDER — INSULIN ASPART 100 UNIT/ML IJ SOLN
0.0000 [IU] | Freq: Three times a day (TID) | INTRAMUSCULAR | Status: DC
  Administered 2023-02-25: 2 [IU] via SUBCUTANEOUS
  Administered 2023-02-25: 3 [IU] via SUBCUTANEOUS
  Administered 2023-02-26 – 2023-02-27 (×3): 2 [IU] via SUBCUTANEOUS
  Administered 2023-02-28: 3 [IU] via SUBCUTANEOUS
  Administered 2023-03-01 – 2023-03-03 (×4): 2 [IU] via SUBCUTANEOUS
  Administered 2023-03-03: 3 [IU] via SUBCUTANEOUS
  Administered 2023-03-04 – 2023-03-05 (×5): 2 [IU] via SUBCUTANEOUS

## 2023-02-25 MED ORDER — APIXABAN 5 MG PO TABS
5.0000 mg | ORAL_TABLET | Freq: Two times a day (BID) | ORAL | Status: DC
  Administered 2023-02-25 – 2023-02-27 (×5): 5 mg via ORAL
  Filled 2023-02-25: qty 2
  Filled 2023-02-25 (×4): qty 1

## 2023-02-25 MED ORDER — MAGNESIUM OXIDE -MG SUPPLEMENT 400 (240 MG) MG PO TABS
400.0000 mg | ORAL_TABLET | Freq: Two times a day (BID) | ORAL | Status: AC
  Administered 2023-02-25 (×2): 400 mg via ORAL
  Filled 2023-02-25 (×2): qty 1

## 2023-02-25 NOTE — ED Notes (Signed)
ED TO INPATIENT HANDOFF REPORT  ED Nurse Name and Phone #: Einar Grad [phone]  S Name/Age/Gender Tracie [name] 62 y.o. female Room/Bed: 001C/001C  Code Status   Code Status: Full Code  Home/SNF/Other Home Patient oriented to: self, place, time, and situation Is this baseline? Yes   Triage Complete: Triage complete  Chief Complaint Atrial fibrillation with RVR (HCC) [I48.91]  Triage Note Patient C/O muscle spasms to lower right back and down her right leg, patient has been experiencing these issues since 02/10/23   Allergies Allergies  Allergen Reactions   Codeine Other (See Comments)    Unknown reaction   Crestor [Rosuvastatin] Other (See Comments)    Myalgias  Weakness    Level of Care/Admitting Diagnosis ED Disposition     ED Disposition  Admit   Condition  --   Comment  Hospital Area: [facility] [[id number]]  Level of Care: Progressive [102]  Admit to Progressive based on following criteria: CARDIOVASCULAR & THORACIC of moderate stability with acute coronary syndrome symptoms/low risk myocardial infarction/hypertensive urgency/arrhythmias/heart failure potentially compromising stability and stable post cardiovascular intervention patients.  May place patient in observation at [facility] or [facility] if equivalent level of care is available:: No  Covid Evaluation: Asymptomatic - no recent exposure (last 10 days) testing not required  Diagnosis: Atrial fibrillation with RVR [facility]) [[id number]]  Admitting Physician: Orland Mustard [[id number]]  Attending Physician: Orland Mustard [[id number]]          B Medical/Surgery History Past Medical History:  Diagnosis Date   History of DVT (deep vein thrombosis) 2010   Hyperlipidemia    Hypertension    Past Surgical History:  Procedure Laterality Date   ABDOMINAL HYSTERECTOMY     FRACTURE SURGERY Left    hand   KNEE ARTHROSCOPY Right 1990s     A IV Location/Drains/Wounds Patient  Lines/Drains/Airways Status     Active Line/Drains/Airways     Name Placement date Placement time Site Days   Peripheral IV 02/24/23 20 G 1" Left Antecubital 02/24/23  0550  Antecubital  1            Intake/Output Last 24 hours No intake or output data in the 24 hours ending 02/25/23 1210  Labs/Imaging Results for orders placed or performed during the hospital encounter of 02/24/23 (from the past 48 hour(s))  Urinalysis, Routine w reflex microscopic -Urine, Clean Catch     Status: Abnormal   Collection Time: 02/24/23  5:18 AM  Result Value Ref Range   Color, Urine YELLOW YELLOW   APPearance CLEAR CLEAR   Specific Gravity, Urine 1.030 1.005 - 1.030   pH 5.0 5.0 - 8.0   Glucose, UA >=500 (A) NEGATIVE mg/dL   Hgb urine dipstick NEGATIVE NEGATIVE   Bilirubin Urine NEGATIVE NEGATIVE   Ketones, ur 5 (A) NEGATIVE mg/dL   Protein, ur NEGATIVE NEGATIVE mg/dL   Nitrite NEGATIVE NEGATIVE   Leukocytes,Ua NEGATIVE NEGATIVE   RBC / HPF 0-5 0 - 5 RBC/hpf   WBC, UA 6-10 0 - 5 WBC/hpf   Bacteria, UA RARE (A) NONE SEEN   Squamous Epithelial / HPF 0-5 0 - 5 /HPF    Comment: Performed at [facility], [address] [place], [place] [address]  CBC with Differential     Status: Abnormal   Collection Time: 02/24/23  5:48 AM  Result Value Ref Range   WBC 14.2 (H) 4.0 - 10.5 K/uL   RBC 5.11 3.87 - 5.11 MIL/uL   Hemoglobin  14.1 12.0 - 15.0 g/dL   HCT 16.1 09.6 - 04.5 %   MCV 89.4 80.0 - 100.0 fL   MCH 27.6 26.0 - 34.0 pg   MCHC 30.9 30.0 - 36.0 g/dL   RDW 40.9 81.1 - 91.4 %   Platelets 316 150 - 400 K/uL   nRBC 0.0 0.0 - 0.2 %   Neutrophils Relative % 82 %   Neutro Abs 11.6 (H) 1.7 - 7.7 K/uL   Lymphocytes Relative 12 %   Lymphs Abs 1.7 0.7 - 4.0 K/uL   Monocytes Relative 5 %   Monocytes Absolute 0.7 0.1 - 1.0 K/uL   Eosinophils Relative 0 %   Eosinophils Absolute 0.0 0.0 - 0.5 K/uL   Basophils Relative 0 %   Basophils Absolute 0.0 0.0 - 0.1 K/uL   Immature Granulocytes 1 %    Abs Immature Granulocytes 0.08 (H) 0.00 - 0.07 K/uL    Comment: Performed at [facility], [address], [place], [place] [address]  Basic metabolic panel     Status: Abnormal   Collection Time: 02/24/23  5:48 AM  Result Value Ref Range   Sodium 135 135 - 145 mmol/L   Potassium 3.3 (L) 3.5 - 5.1 mmol/L   Chloride 104 98 - 111 mmol/L   CO2 22 22 - 32 mmol/L   Glucose, Bld 186 (H) 70 - 99 mg/dL    Comment: Glucose reference range applies only to samples taken after fasting for at least 8 hours.   BUN 13 8 - 23 mg/dL   Creatinine, Ser 6.21 0.44 - 1.00 mg/dL   Calcium 8.6 (L) 8.9 - 10.3 mg/dL   GFR, Estimated >30 >86 mL/min    Comment: (NOTE) Calculated using the CKD-EPI Creatinine Equation (2021)    Anion gap 9 5 - 15    Comment: Performed at [facility], [address], [place], [place] [address]  Brain natriuretic peptide     Status: Abnormal   Collection Time: 02/24/23  5:48 AM  Result Value Ref Range   B Natriuretic Peptide 135.6 (H) 0.0 - 100.0 pg/mL    Comment: Performed at [facility], [address] [place], [place] [address]  Magnesium     Status: Abnormal   Collection Time: 02/24/23  7:48 AM  Result Value Ref Range   Magnesium 1.6 (L) 1.7 - 2.4 mg/dL    Comment: Performed at [facility], [address], [place], [place] [address]  TSH     Status: None   Collection Time: 02/24/23  7:48 AM  Result Value Ref Range   TSH 1.054 0.350 - 4.500 uIU/mL    Comment: Performed by a 3rd Generation assay with a functional sensitivity of <=0.01 uIU/mL. Performed at [facility], [address], [place], [place] [address]   HIV Antibody (routine testing w rflx)     Status: None   Collection Time: 02/24/23  7:48 AM  Result Value Ref Range   HIV Screen 4th Generation wRfx Non Reactive Non Reactive    Comment: Performed at [facility], [address], [place], [place] [address]  Hemoglobin A1c     Status: Abnormal   Collection Time:  02/24/23  7:48 AM  Result Value Ref Range   Hgb A1c MFr Bld 7.0 (H) 4.8 - 5.6 %    Comment: (NOTE) Pre diabetes:          5.7%-6.4%  Diabetes:              >  6.4%  Glycemic control for   <7.0% adults with diabetes    Mean Plasma Glucose 154.2 mg/dL    Comment: Performed at [facility], [address], [place], [place] [address]  CBG monitoring, ED     Status: Abnormal   Collection Time: 02/24/23  8:15 AM  Result Value Ref Range   Glucose-Capillary 240 (H) 70 - 99 mg/dL    Comment: Glucose reference range applies only to samples taken after fasting for at least 8 hours.  CBG monitoring, ED     Status: Abnormal   Collection Time: 02/24/23 12:19 PM  Result Value Ref Range   Glucose-Capillary 147 (H) 70 - 99 mg/dL    Comment: Glucose reference range applies only to samples taken after fasting for at least 8 hours.  CBG monitoring, ED     Status: Abnormal   Collection Time: 02/24/23  5:25 PM  Result Value Ref Range   Glucose-Capillary 141 (H) 70 - 99 mg/dL    Comment: Glucose reference range applies only to samples taken after fasting for at least 8 hours.  CBG monitoring, ED     Status: Abnormal   Collection Time: 02/25/23 12:16 AM  Result Value Ref Range   Glucose-Capillary 180 (H) 70 - 99 mg/dL    Comment: Glucose reference range applies only to samples taken after fasting for at least 8 hours.  Basic metabolic panel     Status: Abnormal   Collection Time: 02/25/23  1:12 AM  Result Value Ref Range   Sodium 138 135 - 145 mmol/L   Potassium 3.2 (L) 3.5 - 5.1 mmol/L   Chloride 106 98 - 111 mmol/L   CO2 25 22 - 32 mmol/L   Glucose, Bld 175 (H) 70 - 99 mg/dL    Comment: Glucose reference range applies only to samples taken after fasting for at least 8 hours.   BUN 12 8 - 23 mg/dL   Creatinine, Ser 6.21 0.44 - 1.00 mg/dL   Calcium 8.7 (L) 8.9 - 10.3 mg/dL   GFR, Estimated >30 >86 mL/min    Comment: (NOTE) Calculated using the CKD-EPI Creatinine Equation (2021)    Anion  gap 7 5 - 15    Comment: Performed at [facility], [address] [place], [place] [address]  CBC     Status: Abnormal   Collection Time: 02/25/23  1:12 AM  Result Value Ref Range   WBC 13.7 (H) 4.0 - 10.5 K/uL   RBC 4.85 3.87 - 5.11 MIL/uL   Hemoglobin 13.5 12.0 - 15.0 g/dL   HCT 96.2 95.2 - 84.1 %   MCV 90.1 80.0 - 100.0 fL   MCH 27.8 26.0 - 34.0 pg   MCHC 30.9 30.0 - 36.0 g/dL   RDW 32.4 40.1 - 02.7 %   Platelets 291 150 - 400 K/uL   nRBC 0.0 0.0 - 0.2 %    Comment: Performed at [facility], [address], [place], [place] [address]  Lipid panel     Status: None   Collection Time: 02/25/23  1:12 AM  Result Value Ref Range   Cholesterol 143 0 - 200 mg/dL   Triglycerides 79 <440 mg/dL   HDL 47 >34 mg/dL   Total CHOL/HDL Ratio 3.0 RATIO   VLDL 16 0 - 40 mg/dL   LDL Cholesterol 80 0 - 99 mg/dL    Comment:        Total Cholesterol/HDL:CHD Risk Coronary Heart Disease Risk Table  Men   Women  1/2 Average Risk   3.4   3.3  Average Risk       5.0   4.4  2 X Average Risk   9.6   7.1  3 X Average Risk  23.4   11.0        Use the calculated Patient Ratio above and the CHD Risk Table to determine the patient's CHD Risk.        ATP III CLASSIFICATION (LDL):  <100     mg/dL   Optimal  409-811  mg/dL   Near or Above                    Optimal  130-159  mg/dL   Borderline  914-782  mg/dL   High  >956     mg/dL   Very High Performed at [facility], [address], [place], [place] [address]   CBG monitoring, ED     Status: Abnormal   Collection Time: 02/25/23  7:39 AM  Result Value Ref Range   Glucose-Capillary 193 (H) 70 - 99 mg/dL    Comment: Glucose reference range applies only to samples taken after fasting for at least 8 hours.  CBG monitoring, ED     Status: None   Collection Time: 02/25/23 11:56 AM  Result Value Ref Range   Glucose-Capillary 93 70 - 99 mg/dL    Comment: Glucose reference range applies only to samples taken  after fasting for at least 8 hours.   MR LUMBAR SPINE WO CONTRAST  Result Date: 02/25/2023 CLINICAL DATA:  Low back pain, symptoms persist with > 6 wks treatment. EXAM: MRI LUMBAR SPINE WITHOUT CONTRAST TECHNIQUE: Multiplanar, multisequence MR imaging of the lumbar spine was performed. No intravenous contrast was administered. COMPARISON:  Lumbar spine radiographs 02/24/2023. FINDINGS: Segmentation: Conventional numbering is assumed with 5 non-rib-bearing, lumbar type vertebral bodies. Alignment:  Normal. Vertebrae: Inferior endplate compression fracture of the L1 vertebral body with moderate anterior height loss, associated marrow edema and 5 mm retropulsion of the posterior cortex. Irregular T1 hypointense and T2 hyperintense lesions in the L3 and L4 vertebral bodies (sagittal images 9 series 4 and 3). Modic type 1 degenerative endplate marrow signal changes at L4-5. Conus medullaris and cauda equina: Conus extends to the L1 level. Conus and cauda equina appear normal. Paraspinal and other soft tissues: Mild fatty atrophy of the paraspinal muscles. Disc levels: T12-L1:  Small right central disc protrusion. L1-L2:  Mild bilateral facet arthropathy. L2-L3: Small disc bulge and mild bilateral facet arthropathy. No spinal canal stenosis or neural foraminal narrowing. L3-L4: Small disc bulge and mild bilateral facet arthropathy. No spinal canal stenosis or neural foraminal narrowing. L4-L5: Small disc bulge and mild bilateral facet arthropathy. No spinal canal stenosis or neural foraminal narrowing. L5-S1:  Mild bilateral facet arthropathy. IMPRESSION: 1. Acute/subacute inferior endplate compression fracture of the L1 vertebral body with moderate anterior height loss and 5 mm retropulsion of the posterior cortex. 2. Irregular T1 hypointense and T2 hyperintense lesions in the L3 and L4 vertebral bodies, concerning for possible metastatic disease. Recommend further evaluation with contrast-enhanced lumbar spine MRI.  3. Mild multilevel lumbar spondylosis without spinal canal stenosis or neural foraminal narrowing. Electronically Signed   By: Orvan Falconer M.D.   On: 02/25/2023 09:57   DG Lumbar Spine 2-3 Views  Result Date: 02/24/2023 CLINICAL DATA:  620110 Intractable back pain 620110. EXAM: LUMBAR SPINE - 2-3 VIEW COMPARISON:  12/02/2020. FINDINGS: There are  5 nonrib-bearing lumbar vertebrae. There is loss of lumbar lordosis, which may be on the basis of positioning or due to muscle spasm. There is focal kyphosis at L1 level due to moderate anterior wedging deformity of L1 vertebrae, which is new since the prior study. No significant retropulsion or spinal canal compromise. No spondylolisthesis. Moderate anterior wedging deformity of L1 vertebrae. Remaining vertebral body heights are maintained. No aggressive osseous lesion. Mild multilevel degenerative changes in the form of facet arthropathy and marginal osteophyte formation. Sacroiliac joints are symmetric. Visualized soft tissues are within normal limits. IMPRESSION: *Age indeterminate moderate anterior wedging deformity of L1 vertebra, new since the prior study from 2022. No significant retropulsion or spinal canal compromise. Correlate clinically to determine the need for additional imaging with MRI lumbar spine. Electronically Signed   By: Jules Schick M.D.   On: 02/24/2023 11:46   DG HIP UNILAT WITH PELVIS 1V RIGHT  Result Date: 02/24/2023 CLINICAL DATA:  Lower back pain radiating to right hip EXAM: DG HIP (WITH OR WITHOUT PELVIS) 1V RIGHT COMPARISON:  12/02/2020 FINDINGS: Frontal view of the pelvis and frogleg lateral view of the right hip are obtained. On the frontal view of the pelvis the bilateral greater trochanters are excluded by collimation. There are no acute displaced fractures identified on this examination limited by technique and patient body habitus. There is severe bilateral hip osteoarthritis, with severe joint space narrowing, marginal  osteophyte formation, and bony remodeling of the femoral head and acetabuli bilaterally. Progressive changes are seen on the right since prior study. Sacroiliac joints are normal. IMPRESSION: 1. Study limited by technique and body habitus. 2. Severe bilateral hip osteoarthritis, which has progressed on the right since prior study. No prior imaging of the left hip. 3. No evidence of displaced fracture. Electronically Signed   By: Sharlet Salina M.D.   On: 02/24/2023 11:45   DG Chest Portable 1 View  Result Date: 02/24/2023 CLINICAL DATA:  Heart palpitations. EXAM: PORTABLE CHEST 1 VIEW COMPARISON:  PA chest 01/04/2021 FINDINGS: There is moderate to severe cardiomegaly increased from 01/04/2009. Central vessels are normal caliber. There is no overt edema. The lungs are clear. There is no substantial pleural effusion. The mediastinum is stable. Thoracic cage is intact. IMPRESSION: Moderate to severe cardiomegaly increased from 01/04/2009. No overt edema or pleural effusion. Normal caliber vascular markings. Electronically Signed   By: Almira Bar M.D.   On: 02/24/2023 06:02    Pending Labs Unresulted Labs (From admission, onward)    None       Vitals/Pain Today's Vitals   02/25/23 0929 02/25/23 0946 02/25/23 1107 02/25/23 1107  BP: 124/88   121/84  Pulse: 91   97  Resp: 18   16  Temp:    97.8 F (36.6 C)  TempSrc:    Oral  SpO2: 96%   94%  Weight:      Height:      PainSc:  6  6      Isolation Precautions No active isolations  Medications Medications  acetaminophen (TYLENOL) tablet 650 mg (has no administration in time range)  ondansetron (ZOFRAN) injection 4 mg (has no administration in time range)  lidocaine (LIDODERM) 5 % 1 patch (1 patch Transdermal Patient Refused/Not Given 02/25/23 0929)  HYDROcodone-acetaminophen (NORCO/VICODIN) 5-325 MG per tablet 1-2 tablet (2 tablets Oral Given 02/25/23 0948)  aspirin EC tablet 81 mg (81 mg Oral Given 02/25/23 0944)  empagliflozin  (JARDIANCE) tablet 25 mg (25 mg Oral Given 02/25/23 0944)  losartan (  COZAAR) tablet 100 mg (100 mg Oral Given 02/25/23 0944)  predniSONE (DELTASONE) tablet 10 mg (10 mg Oral Given 02/25/23 0748)  cyclobenzaprine (FLEXERIL) tablet 5 mg (5 mg Oral Given 02/25/23 0603)  metoprolol tartrate (LOPRESSOR) tablet 100 mg (100 mg Oral Given 02/25/23 0700)  potassium chloride SA (KLOR-CON M) CR tablet 40 mEq (40 mEq Oral Given 02/25/23 0701)  magnesium oxide (MAG-OX) tablet 400 mg (400 mg Oral Given 02/25/23 0944)  insulin aspart (novoLOG) injection 0-15 Units ( Subcutaneous Not Given 02/25/23 1206)  insulin aspart (novoLOG) injection 0-5 Units (has no administration in time range)  insulin aspart (novoLOG) injection 4 Units (4 Units Subcutaneous Given 02/25/23 0754)  insulin detemir (LEVEMIR) injection 5 Units (has no administration in time range)  morphine (PF) 4 MG/ML injection 4 mg (has no administration in time range)  ketorolac (TORADOL) 15 MG/ML injection 15 mg (15 mg Intravenous Given 02/25/23 0752)  metoprolol tartrate (LOPRESSOR) injection 5 mg (has no administration in time range)  apixaban (ELIQUIS) tablet 5 mg (5 mg Oral Given 02/25/23 0752)  fentaNYL (SUBLIMAZE) injection 100 mcg (100 mcg Intravenous Given 02/24/23 0551)  ondansetron (ZOFRAN) injection 4 mg (4 mg Intravenous Given 02/24/23 0550)  ketorolac (TORADOL) 15 MG/ML injection 15 mg (15 mg Intravenous Given 02/24/23 0750)  potassium chloride SA (KLOR-CON M) CR tablet 40 mEq (40 mEq Oral Given 02/24/23 0924)  HYDROmorphone (DILAUDID) injection 1 mg (1 mg Intravenous Given 02/24/23 1723)  metoprolol tartrate (LOPRESSOR) tablet 12.5 mg (12.5 mg Oral Given 02/25/23 0510)  morphine (PF) 4 MG/ML injection 4 mg (4 mg Intravenous Given 02/25/23 0729)    Mobility walks with device (patient states she has been pretty much non-ambulatory at home due to the pain)     Focused Assessments Cardiac Assessment Handoff:  Cardiac Rhythm:  Atrial fibrillation Lab Results  Component Value Date   CKTOTAL 29 (L) 10/20/2018   No results found for: "DDIMER" Does the Patient currently have chest pain? No    R Recommendations: See Admitting Provider Note  Report given to:   Additional Notes:

## 2023-02-25 NOTE — Discharge Instructions (Signed)

## 2023-02-25 NOTE — Evaluation (Signed)
Physical Therapy Evaluation Patient Details Name: Tracie [name] MRN: [id number] DOB: October 21, 1960 Today's Date: 02/25/2023  History of Present Illness  The pt is a 62 yo female presenting 11/17 for back spasms with pain down her R leg since 11/3. Pt found to be in afib with RVR and x-ray showed age-indeterminate L1 compression fx. PMH includes: HTN, HLD, DM II, afib, DVT, myalgias, and morbid obesity (BMI >40).   Clinical Impression  Pt in bed upon arrival of PT, agreeable to evaluation at this time. Prior to admission the pt was largely sedentary since onset of pain on 11/3. She reports sleeping upright on couch, getting up only to use the bathroom since onset of pain. The pt was able to complete movement of both LE and generate good force production in BLE, but limited in R hip mobility due to pain with hip flexion and abduction. Pt also reports pain to palpation of lateral aspect of thigh. Pt declined all bed mobility and other progression due to concern for onset of spasm, reports she has just gotten comfortable after pre-medication for MRI before PT arrival. Given poor tolerance for even bed mobility at this time, pt will need more assistance and DME to decrease burden on spouse. Will continue to attempt to progress mobility as pt tolerates with goal of return home with HHPT.     If plan is discharge home, recommend the following: A lot of help with walking and/or transfers;A lot of help with bathing/dressing/bathroom;Assistance with cooking/housework;Direct supervision/assist for medications management;Direct supervision/assist for financial management;Assist for transportation;Help with stairs or ramp for entrance   Can travel by private vehicle        Equipment Recommendations BSC/3in1;Wheelchair (measurements PT);Wheelchair cushion (measurements PT);Hospital bed;Hoyer lift  Recommendations for Other Services       Functional Status Assessment Patient has had a recent decline in  their functional status and demonstrates the ability to make significant improvements in function in a reasonable and predictable amount of time.     Precautions / Restrictions Precautions Precautions: Fall Restrictions Weight Bearing Restrictions: No      Mobility  Bed Mobility               General bed mobility comments: Pt refused all bed mobility due to potential for muscle spasms despite premedication for MRI    Transfers                   General transfer comment: Pt declined all mobility despite encouragement          Pertinent Vitals/Pain Pain Assessment Pain Assessment: Faces Pain Score: 7  Faces Pain Scale: Hurts little more Pain Location: back Pain Descriptors / Indicators: Aching, Grimacing, Guarding, Shooting Pain Intervention(s): Limited activity within patient's tolerance, Monitored during session, RN gave pain meds during session, Premedicated before session    Home Living Family/patient expects to be discharged to:: Private residence Living Arrangements: Spouse/significant other Available Help at Discharge: Family;Available 24 hours/day Type of Home: Apartment Home Access: Level entry       Home Layout: One level Home Equipment: Rollator (4 wheels);Cane - single point      Prior Function Prior Level of Function : Needs assist;History of Falls (last six months)       Physical Assist : Mobility (physical);ADLs (physical) Mobility (physical): Gait ADLs (physical): Bathing;Dressing;IADLs;Toileting Mobility Comments: pt has been using rollator. Does not walk much during day since fall in early November. Walks to kitchen/bahtroom and back.  has not left home much in  last month. ADLs Comments: sponge bathes with assist from husband for last month     Extremity/Trunk Assessment   Upper Extremity Assessment Upper Extremity Assessment: Defer to OT evaluation    Lower Extremity Assessment Lower Extremity Assessment: RLE  deficits/detail;Generalized weakness (limited power and likely limited endurance due to recent dissuse) RLE Deficits / Details: pain at hip/thigh to touch and with hip abduction. reports no numbness or tingling RLE: Unable to fully assess due to pain RLE Sensation: WNL RLE Coordination: WNL    Cervical / Trunk Assessment Cervical / Trunk Assessment: Other exceptions Cervical / Trunk Exceptions: chronic back pain and large body habitus  Communication   Communication Communication: No apparent difficulties  Cognition Arousal: Alert Behavior During Therapy: WFL for tasks assessed/performed Overall Cognitive Status: Within Functional Limits for tasks assessed                                 General Comments: poo insight to condition and role of medicine/therapy for management of back pain. resistant at time to encouraged aactivity.        General Comments General comments (skin integrity, edema, etc.): pt largely sedentary since onset of issues on 11/3. unabe to evaluate skin.    Exercises     Assessment/Plan    PT Assessment Patient needs continued PT services  PT Problem List Decreased strength;Decreased range of motion;Decreased balance;Decreased activity tolerance;Decreased mobility;Decreased safety awareness;Pain       PT Treatment Interventions DME instruction;Gait training;Stair training;Functional mobility training;Therapeutic activities;Therapeutic exercise;Balance training;Patient/family education    PT Goals (Current goals can be found in the Care Plan section)  Acute Rehab PT Goals Patient Stated Goal: reduce pain so that she can walk at home PT Goal Formulation: With patient Time For Goal Achievement: 03/11/23 Potential to Achieve Goals: Fair    Frequency Min 1X/week     Co-evaluation   Reason for Co-Treatment: Complexity of the patient's impairments (multi-system involvement) PT goals addressed during session: Mobility/safety with  mobility;Strengthening/ROM OT goals addressed during session: ADL's and self-care       AM-PAC PT "6 Clicks" Mobility  Outcome Measure Help needed turning from your back to your side while in a flat bed without using bedrails?: Total Help needed moving from lying on your back to sitting on the side of a flat bed without using bedrails?: Total Help needed moving to and from a bed to a chair (including a wheelchair)?: Total Help needed standing up from a chair using your arms (e.g., wheelchair or bedside chair)?: Total Help needed to walk in hospital room?: Total Help needed climbing 3-5 steps with a railing? : Total 6 Click Score: 6    End of Session   Activity Tolerance: Patient limited by pain Patient left: in bed;with call bell/phone within reach;with bed alarm set;with family/visitor present Nurse Communication: Mobility status PT Visit Diagnosis: Other abnormalities of gait and mobility (R26.89);Muscle weakness (generalized) (M62.81)    Time: 1610-9604 PT Time Calculation (min) (ACUTE ONLY): 25 min   Charges:   PT Evaluation $PT Eval Low Complexity: 1 Low   PT General Charges $$ ACUTE PT VISIT: 1 Visit         Vickki Muff, PT, DPT   Acute Rehabilitation Department Office [phone] Secure Chat Communication Preferred  Ronnie Derby 02/25/2023, 10:58 AM

## 2023-02-25 NOTE — ED Notes (Signed)
CBG 93 

## 2023-02-25 NOTE — ED Notes (Signed)
Patient returned from MRI and placed back on continuous cardiac and pulse oximetry monitoring.

## 2023-02-25 NOTE — ED Notes (Signed)
Patient to MRI; per hospitalist physician, patient does not require any monitoring or nurse accompaniment.

## 2023-02-25 NOTE — Progress Notes (Addendum)
[facility] PROGRESS NOTE    Progress Note  Henri Kaz  ZOX:[id number] DOB: 06/01/1960 DOA: 02/24/2023 PCP: Aliene Beams, MD     Brief Narrative:   Keslynn Colombini is an 62 y.o. female past medical history of hypertension diabetes mellitus type 2, chronic atrial fibrillation and history of DVT on apixaban, [name] seen in the ED in 02/21/2023 for back pain diagnosed with sciatica sent home with medications, saw her PCP but was given muscle relaxer and is steroid taper comes to the ED complaining of back pain was found to be in A-fib with RVR.  She says she was getting better but the pain came back worst no neurological symptoms.  Assessment/Plan:   Atrial fibrillation with RVR (HCC): Remain in RVR with a heart rate of 130-1 40 last to follow-up with cardiology in 2021 at that time she declined cardioversion.  He was started on metoprolol at that time.  In 2021 anticoagulation she declined anticoagulation. In the ED was not started on a Cardizem drip, as she refused it. Chads Vascor greater than 4. 2D echo sent.  Chest x-ray showed cardiomegaly no edema. Medium is low try to keep greater than 2.  And TSH. She agreed to oral metoprolol increased. Change metoprolol to metoprolol twice daily increased to 100 she is still going 120. Use IV metoprolol as needed.  I believe her ejection fraction will stay to be less than 50% there is a third heart sound.  She shows new cardiomegaly on x-ray.  Lumbar pain/L1 compression fracture/osteoporosis: With radiation to the right leg no tingling or numbness, she has a history of severe osteoarthritis of the right hip. Lumbar x-ray showed an L1 of indeterminate age vertebral wedge deformity. Right hip x-ray showed bilateral osteoarthritis which has progressed.  Hypokalemia/hypomagnesemia: Replete recheck magnesium. Check a potassium greater than 4 magnesium greater than 2.  Leukocytosis: Likely due to steroids she has remained  afebrile.  Controlled diabetes mellitus type 2: Holding glipizide continue Jardiance. She was started on sliding scale insulin CBGs ACH S, A1c of 7.0.  Essential hypertension: Continue Norvasc holding Cozaar.  Hyperlipidemia: Continue statins.   DVT prophylaxis: Eliquis Family Communication:none Status is: Observation The patient remains OBS appropriate and will d/c before 2 midnights.    Code Status:     Code Status Orders  (From admission, onward)           Start     Ordered   02/24/23 0737  Full code  Continuous       Question:  By:  Answer:  Consent: discussion documented in EHR   02/24/23 0739           Code Status History     This patient has a current code status but no historical code status.         IV Access:   Peripheral IV   Procedures and diagnostic studies:   DG Lumbar Spine 2-3 Views  Result Date: 02/24/2023 CLINICAL DATA:  620110 Intractable back pain 620110. EXAM: LUMBAR SPINE - 2-3 VIEW COMPARISON:  12/02/2020. FINDINGS: There are 5 nonrib-bearing lumbar vertebrae. There is loss of lumbar lordosis, which may be on the basis of positioning or due to muscle spasm. There is focal kyphosis at L1 level due to moderate anterior wedging deformity of L1 vertebrae, which is new since the prior study. No significant retropulsion or spinal canal compromise. No spondylolisthesis. Moderate anterior wedging deformity of L1 vertebrae. Remaining vertebral body heights are maintained. No aggressive osseous lesion. Mild  multilevel degenerative changes in the form of facet arthropathy and marginal osteophyte formation. Sacroiliac joints are symmetric. Visualized soft tissues are within normal limits. IMPRESSION: *Age indeterminate moderate anterior wedging deformity of L1 vertebra, new since the prior study from 2022. No significant retropulsion or spinal canal compromise. Correlate clinically to determine the need for additional imaging with MRI lumbar  spine. Electronically Signed   By: Jules Schick M.D.   On: 02/24/2023 11:46   DG HIP UNILAT WITH PELVIS 1V RIGHT  Result Date: 02/24/2023 CLINICAL DATA:  Lower back pain radiating to right hip EXAM: DG HIP (WITH OR WITHOUT PELVIS) 1V RIGHT COMPARISON:  12/02/2020 FINDINGS: Frontal view of the pelvis and frogleg lateral view of the right hip are obtained. On the frontal view of the pelvis the bilateral greater trochanters are excluded by collimation. There are no acute displaced fractures identified on this examination limited by technique and patient body habitus. There is severe bilateral hip osteoarthritis, with severe joint space narrowing, marginal osteophyte formation, and bony remodeling of the femoral head and acetabuli bilaterally. Progressive changes are seen on the right since prior study. Sacroiliac joints are normal. IMPRESSION: 1. Study limited by technique and body habitus. 2. Severe bilateral hip osteoarthritis, which has progressed on the right since prior study. No prior imaging of the left hip. 3. No evidence of displaced fracture. Electronically Signed   By: Sharlet Salina M.D.   On: 02/24/2023 11:45   DG Chest Portable 1 View  Result Date: 02/24/2023 CLINICAL DATA:  Heart palpitations. EXAM: PORTABLE CHEST 1 VIEW COMPARISON:  PA chest 01/04/2021 FINDINGS: There is moderate to severe cardiomegaly increased from 01/04/2009. Central vessels are normal caliber. There is no overt edema. The lungs are clear. There is no substantial pleural effusion. The mediastinum is stable. Thoracic cage is intact. IMPRESSION: Moderate to severe cardiomegaly increased from 01/04/2009. No overt edema or pleural effusion. Normal caliber vascular markings. Electronically Signed   By: Almira Bar M.D.   On: 02/24/2023 06:02     Medical Consultants:   None.   Subjective:    Elinora Veerkamp relates her back pain is unbearable.  Objective:    Vitals:   02/25/23 0400 02/25/23 0415 02/25/23  0500 02/25/23 0510  BP: (!) 143/106 136/81 (!) 131/99 (!) 131/94  Pulse: 95 (!) 105 (!) 106 (!) 101  Resp: 15 (!) 21 15   Temp:      TempSrc:      SpO2: 92% 91% 91%   Weight:      Height:       SpO2: 91 %   Intake/Output Summary (Last 24 hours) at 02/25/2023 0640 Last data filed at 02/24/2023 0925 Gross per 24 hour  Intake 12.51 ml  Output --  Net 12.51 ml   Filed Weights   02/24/23 0442  Weight: (!) 144.7 kg    Exam: General exam: In no acute distress. Respiratory system: Good air movement and clear to auscultation. Cardiovascular system: S1 & S2 heard, positive S3, irregular rhythm. Gastrointestinal system: Abdomen is nondistended, soft and nontender.  Extremities: No pedal edema. Skin: No rashes, lesions or ulcers Psychiatry: Judgement and insight appear normal. Mood & affect appropriate.    Data Reviewed:    Labs: Basic Metabolic Panel: Recent Labs  Lab 02/24/23 0548 02/24/23 0748 02/25/23 0112  NA 135  --  138  K 3.3*  --  3.2*  CL 104  --  106  CO2 22  --  25  GLUCOSE 186*  --  175*  BUN 13  --  12  CREATININE 0.69  --  0.70  CALCIUM 8.6*  --  8.7*  MG  --  1.6*  --    GFR Estimated Creatinine Clearance: 111.8 mL/min (by C-G formula based on SCr of 0.7 mg/dL). Liver Function Tests: No results for input(s): "AST", "ALT", "ALKPHOS", "BILITOT", "PROT", "ALBUMIN" in the last 168 hours. No results for input(s): "LIPASE", "AMYLASE" in the last 168 hours. No results for input(s): "AMMONIA" in the last 168 hours. Coagulation profile No results for input(s): "INR", "PROTIME" in the last 168 hours. COVID-19 Labs  No results for input(s): "DDIMER", "FERRITIN", "LDH", "CRP" in the last 72 hours.  No results found for: "SARSCOV2NAA"  CBC: Recent Labs  Lab 02/24/23 0548 02/25/23 0112  WBC 14.2* 13.7*  NEUTROABS 11.6*  --   HGB 14.1 13.5  HCT 45.7 43.7  MCV 89.4 90.1  PLT 316 291   Cardiac Enzymes: No results for input(s): "CKTOTAL", "CKMB",  "CKMBINDEX", "TROPONINI" in the last 168 hours. BNP (last 3 results) No results for input(s): "PROBNP" in the last 8760 hours. CBG: Recent Labs  Lab 02/24/23 0815 02/24/23 1219 02/24/23 1725 02/25/23 0016  GLUCAP 240* 147* 141* 180*   D-Dimer: No results for input(s): "DDIMER" in the last 72 hours. Hgb A1c: Recent Labs    02/24/23 0748  HGBA1C 7.0*   Lipid Profile: Recent Labs    02/25/23 0112  CHOL 143  HDL 47  LDLCALC 80  TRIG 79  CHOLHDL 3.0   Thyroid function studies: Recent Labs    02/24/23 0748  TSH 1.054   Anemia work up: No results for input(s): "VITAMINB12", "FOLATE", "FERRITIN", "TIBC", "IRON", "RETICCTPCT" in the last 72 hours. Sepsis Labs: Recent Labs  Lab 02/24/23 0548 02/25/23 0112  WBC 14.2* 13.7*   Microbiology No results found for this or any previous visit (from the past 240 hour(s)).   Medications:    aspirin EC  81 mg Oral Daily   empagliflozin  25 mg Oral Daily   enoxaparin (LOVENOX) injection  40 mg Subcutaneous Daily   insulin aspart  0-15 Units Subcutaneous TID WC   lidocaine  1 patch Transdermal Daily   losartan  100 mg Oral Daily   metoprolol succinate  75 mg Oral Daily   predniSONE  10 mg Oral Q breakfast   Continuous Infusions:    LOS: 0 days   Marinda Elk  [facility]  02/25/2023, 6:40 AM

## 2023-02-25 NOTE — Evaluation (Signed)
Occupational Therapy Evaluation Patient Details Name: Tracie [name] MRN: [id number] DOB: November 20, 1960 Today's Date: 02/25/2023   History of Present Illness Pt is a 62 yo female admitted when she could not get off the  toilet due to back pain. Pt fell beginning of Nov. and has had chronic back pain that has worsened since fall. Pt found to be in afib with RVR. PMH: arthritis to R hip, L1 fx   Clinical Impression   Pt admitted with the above diagnosis and has the deficits outlined below. Pt would benefit from cont OT to increase independence with basic adls and adl transfers if she agrees to mobilize.  Pt currently is refusing all mobility at this time so unsure of clear d/c plan.  Prior to admission, pt was getting assist since early November with all adls and IADLS and walks with rollator with seat but only to bathroom and back.  Prior to a month ago, pt did go on occasional errands with husband and was more mobile. Rec HHOT at this time.  If pt does not mobilize, pt will need hoyer lift and w/c at home.  Will continue with focus on encouraging movement.      If plan is discharge home, recommend the following: Two people to help with walking and/or transfers;A lot of help with bathing/dressing/bathroom;Assistance with cooking/housework;Assist for transportation    Functional Status Assessment  Patient has had a recent decline in their functional status and demonstrates the ability to make significant improvements in function in a reasonable and predictable amount of time.  Equipment Recommendations  Other (comment) (bariatric BSC)    Recommendations for Other Services       Precautions / Restrictions Precautions Precautions: Fall Restrictions Weight Bearing Restrictions: No      Mobility Bed Mobility               General bed mobility comments: Pt refused all bed mobility    Transfers                   General transfer comment: Pt declined all mobility  despite encouragement      Balance                                           ADL either performed or assessed with clinical judgement   ADL Overall ADL's : Needs assistance/impaired Eating/Feeding: Set up;Sitting   Grooming: Set up;Sitting   Upper Body Bathing: Minimal assistance;Sitting;Bed level   Lower Body Bathing: Maximal assistance;Bed level   Upper Body Dressing : Minimal assistance;Sitting   Lower Body Dressing: Total assistance;+2 for physical assistance;Bed level Lower Body Dressing Details (indicate cue type and reason): limited by pain     Toileting- Clothing Manipulation and Hygiene: Maximal assistance;Bed level Toileting - Clothing Manipulation Details (indicate cue type and reason): refuses to get up to [employer] [facility] [employer]       General ADL Comments: Pt refusing all mobility due to pain. Pt states "they wont let me move" referring to doctors in ER.  Explained to her she can move in this room and get up to EOB but pt refused.     Vision Baseline Vision/History: 0 No visual deficits Ability to See in Adequate Light: 0 Adequate Patient Visual Report: No change from baseline Vision Assessment?: No apparent visual deficits     Perception Perception: Within Functional Limits  Praxis         Pertinent Vitals/Pain Pain Assessment Pain Assessment: 0-10 Pain Score: 7  Pain Location: back Pain Descriptors / Indicators: Aching, Grimacing, Guarding, Shooting Pain Intervention(s): Limited activity within patient's tolerance, Monitored during session, Premedicated before session     Extremity/Trunk Assessment Upper Extremity Assessment Upper Extremity Assessment: Overall WFL for tasks assessed   Lower Extremity Assessment Lower Extremity Assessment: Defer to PT evaluation   Cervical / Trunk Assessment Cervical / Trunk Assessment: Other exceptions Cervical / Trunk Exceptions: chronic back pain.   Communication Communication Communication: No  apparent difficulties   Cognition Arousal: Alert Behavior During Therapy: WFL for tasks assessed/performed Overall Cognitive Status: Within Functional Limits for tasks assessed                                       General Comments  unable to eval balance due to pt refusing to move    Exercises     Shoulder Instructions      Home Living Family/patient expects to be discharged to:: Private residence Living Arrangements: Spouse/significant other Available Help at Discharge: Family;Available 24 hours/day Type of Home: Apartment Home Access: Level entry     Home Layout: One level     Bathroom Shower/Tub: Tub/shower unit;Curtain   Firefighter: Standard     Home Equipment: Rollator (4 wheels);Cane - single point          Prior Functioning/Environment Prior Level of Function : Needs assist;History of Falls (last six months)       Physical Assist : Mobility (physical);ADLs (physical) Mobility (physical): Gait ADLs (physical): Bathing;Dressing;IADLs;Toileting Mobility Comments: pt has been using rollator. Does not walk much during day since fall in early November. Walks to kitchen/bahtroom and back.  has not left home much in last month. ADLs Comments: sponge bathes with assist from husband for last month        OT Problem List: Decreased activity tolerance;Impaired balance (sitting and/or standing);Decreased knowledge of use of DME or AE;Decreased knowledge of precautions;Obesity;Pain      OT Treatment/Interventions: Self-care/ADL training;Energy conservation;Therapeutic activities;Balance training    OT Goals(Current goals can be found in the care plan section) Acute Rehab OT Goals Patient Stated Goal: to get rid of pain so she can move OT Goal Formulation: With patient Time For Goal Achievement: 03/11/23 Potential to Achieve Goals: Fair ADL Goals Pt Will Perform Grooming: with contact guard assist;standing Pt Will Perform Lower Body Bathing:  with min assist;with adaptive equipment;sit to/from stand Pt Will Perform Lower Body Dressing: with min assist;with adaptive equipment;sit to/from stand Pt Will Transfer to Toilet: with contact guard assist;bedside commode Pt Will Perform Toileting - Clothing Manipulation and hygiene: with contact guard assist;sit to/from stand Additional ADL Goal #1: Pt will toletate sitting EOB for 5 minutes with supervision in prep for adls EOB  OT Frequency: Min 1X/week    Co-evaluation PT/OT/SLP Co-Evaluation/Treatment: Yes Reason for Co-Treatment: Complexity of the patient's impairments (multi-system involvement) PT goals addressed during session: Mobility/safety with mobility OT goals addressed during session: ADL's and self-care      AM-PAC OT "6 Clicks" Daily Activity     Outcome Measure Help from another person eating meals?: A Little Help from another person taking care of personal grooming?: A Little Help from another person toileting, which includes using toliet, bedpan, or urinal?: A Lot Help from another person bathing (including washing, rinsing, drying)?: A Lot Help from  another person to put on and taking off regular upper body clothing?: A Little Help from another person to put on and taking off regular lower body clothing?: Total 6 Click Score: 14   End of Session Nurse Communication: Mobility status  Activity Tolerance: Patient limited by pain Patient left: in bed;with call bell/phone within reach;with family/visitor present  OT Visit Diagnosis: Pain;Repeated falls (R29.6) Pain - Right/Left: Left Pain - part of body: Leg (back)                Time: 1610-9604 OT Time Calculation (min): 28 min Charges:  OT General Charges $OT Visit: 1 Visit OT Evaluation $OT Eval Moderate Complexity: 1 Mod  Hope Budds 02/25/2023, 10:25 AM

## 2023-02-25 NOTE — ED Notes (Signed)
CBG 193. 

## 2023-02-25 NOTE — Progress Notes (Signed)
PHARMACY - ANTICOAGULATION CONSULT NOTE  Pharmacy Consult for apixaban Indication: atrial fibrillation  Allergies  Allergen Reactions   Codeine Other (See Comments)    Unknown reaction   Crestor [Rosuvastatin] Other (See Comments)    Myalgias  Weakness    Patient Measurements: Height: 5' 10.51" (179.1 cm) Weight: (!) 144.7 kg (318 lb 15.7 oz) IBW/kg (Calculated) : 69.68  Vital Signs: Temp: 97.8 F (36.6 C) (11/18 0706) Temp Source: Oral (11/18 0706) BP: 133/90 (11/18 0730) Pulse Rate: 101 (11/18 0730)  Labs: Recent Labs    02/24/23 0548 02/25/23 0112  HGB 14.1 13.5  HCT 45.7 43.7  PLT 316 291  CREATININE 0.69 0.70    Estimated Creatinine Clearance: 111.8 mL/min (by C-G formula based on SCr of 0.7 mg/dL).   Medical History: Past Medical History:  Diagnosis Date   History of DVT (deep vein thrombosis) 2010   Hyperlipidemia    Hypertension     Medications:  (Not in a hospital admission)   Assessment: 62 yo F with a history of HTN, T2DM, chronic Afib, and h/o DVT presents to ED complaining of back pain and found to be in Afib w/ RVR, CHADS2VASc = 3. Patient has not been taking apixaban since 2021, patient has declined anticoagulation therapy previously. Per discussion with provider this AM, patient is agreeable to initiating apixaban for stroke prevention. Pharmacy consulted to dose apixaban for Afib.   Hgb 13.5, Plt 291 - stable No s/sx of bleeding  Goal of Therapy:  Monitor platelets by anticoagulation protocol: Yes   Plan:  Start apixaban 5mg  po BID Monitor daily CBC and for s/sx of bleeding Check copay cost of apixaban Pharmacy to provide patient education on apixaban prior to discharge   Wilburn Cornelia, PharmD, BCPS Clinical Pharmacist 02/25/2023 9:20 AM   Please refer to [facility] for pharmacy phone number

## 2023-02-25 NOTE — Progress Notes (Signed)
*  PRELIMINARY RESULTS* Echocardiogram 2D Echocardiogram has been performed.  Tracie [name] 02/25/2023, 2:12 PM

## 2023-02-25 NOTE — Progress Notes (Signed)
Physical Therapy Treatment Patient Details Name: Tracie [name] MRN: [id number] DOB: November 30, 1960 Today's Date: 02/25/2023   History of Present Illness The pt is a 62 yo female presenting 11/17 for back spasms with pain down her R leg since 11/3. Pt found to be in afib with RVR and x-ray showed age-indeterminate L1 compression fx. PMH includes: HTN, HLD, DM II, afib, DVT, myalgias, and morbid obesity (BMI >40).    PT Comments  Pt remains limited by back pain and spasms, although is agreeable to bed mobility. Pt reports walking to the bathroom earlier in the day with mobility specialist, however has great difficulty and significant pain when mobilizing in bed during this session. PT does note that the pt had not received pain medication or a muscle relaxant in multiple hours, likely contributing to high levels of pain during this session. PT will follow up in an effort to improve activity tolerance and to reduce falls risk. PT recommends short term inpatient PT placement at this time, as the pt was unable to tolerate transfer attempts, and appears unable to manage most tasks she would need to perform at home.   If plan is discharge home, recommend the following: A lot of help with walking and/or transfers;A lot of help with bathing/dressing/bathroom;Assistance with cooking/housework;Direct supervision/assist for medications management;Direct supervision/assist for financial management;Assist for transportation;Help with stairs or ramp for entrance   Can travel by private vehicle     No  Equipment Recommendations  Other (comment) (bariatric bedside commode, bariatric wheelchair)    Recommendations for Other Services       Precautions / Restrictions Precautions Precautions: Fall Restrictions Weight Bearing Restrictions: No     Mobility  Bed Mobility Overal bed mobility: Needs Assistance Bed Mobility: Rolling, Sidelying to Sit, Sit to Sidelying Rolling: Contact guard assist Sidelying  to sit: Min assist     Sit to sidelying: Min assist General bed mobility comments: increased time for all bed mobility, multiple minutes to rest between positional changes and movements. Assist for LE management with sit to supine, assist fro trunk with supine to sit    Transfers Overall transfer level:  (pt unable to tolerate transfer attempts due to pain)                      Ambulation/Gait                   Stairs             Wheelchair Mobility     Tilt Bed    Modified Rankin (Stroke Patients Only)       Balance Overall balance assessment: Needs assistance Sitting-balance support: No upper extremity supported, Feet supported Sitting balance-Leahy Scale: Fair                                      Cognition Arousal: Alert Behavior During Therapy: Anxious Overall Cognitive Status: Within Functional Limits for tasks assessed                                 General Comments: pt is very anxious regarding mobilizing due to pain        Exercises      General Comments General comments (skin integrity, edema, etc.): pt is tachycardic up to 148 observed by this PT during session, monitor at times  reading in afib. spikes in HR often associated with pt's reports of pain      Pertinent Vitals/Pain Pain Assessment Pain Assessment: 0-10 Pain Score: 8  Pain Location: back Pain Descriptors / Indicators: Spasm Pain Intervention(s): RN gave pain meds during session    Home Living                          Prior Function            PT Goals (current goals can now be found in the care plan section) Acute Rehab PT Goals Patient Stated Goal: reduce pain so that she can walk at home Progress towards PT goals: Not progressing toward goals - comment (remains limited by back pain)    Frequency    Min 1X/week      PT Plan      Co-evaluation              AM-PAC PT "6 Clicks" Mobility   Outcome  Measure  Help needed turning from your back to your side while in a flat bed without using bedrails?: A Little Help needed moving from lying on your back to sitting on the side of a flat bed without using bedrails?: A Little Help needed moving to and from a bed to a chair (including a wheelchair)?: Total Help needed standing up from a chair using your arms (e.g., wheelchair or bedside chair)?: Total Help needed to walk in hospital room?: Total Help needed climbing 3-5 steps with a railing? : Total 6 Click Score: 10    End of Session Equipment Utilized During Treatment: Gait belt Activity Tolerance: Patient limited by pain Patient left: in bed;with call bell/phone within reach;with bed alarm set Nurse Communication: Mobility status PT Visit Diagnosis: Other abnormalities of gait and mobility (R26.89);Muscle weakness (generalized) (M62.81)     Time: 4098-1191 PT Time Calculation (min) (ACUTE ONLY): 50 min  Charges:    $Therapeutic Activity: 38-52 mins PT General Charges $$ ACUTE PT VISIT: 1 Visit                     Arlyss Gandy, PT, DPT Acute Rehabilitation Office [phone]    Arlyss Gandy 02/25/2023, 4:45 PM

## 2023-02-25 NOTE — Progress Notes (Signed)
PT Cancellation Note  Patient Details Name: Tracie [name] MRN: [id number] DOB: [birth year]   Cancelled Treatment:    Reason Eval/Treat Not Completed: Patient at procedure or test/unavailable. Pt off unit at echo per RN. PT will attempt to follow up as time allows.   Arlyss Gandy 02/25/2023, 2:28 PM

## 2023-02-25 NOTE — Progress Notes (Signed)
Mobility Specialist Progress Note:   02/25/23 1400  Mobility  Activity Ambulated with assistance to bathroom  Level of Assistance Minimal assist, patient does 75% or more  Assistive Device Front wheel walker  Distance Ambulated (ft) 20 ft  Activity Response Tolerated well  Mobility Referral Yes  $Mobility charge 1 Mobility  Mobility Specialist Start Time (ACUTE ONLY) 1430  Mobility Specialist Stop Time (ACUTE ONLY) 1439  Mobility Specialist Time Calculation (min) (ACUTE ONLY) 9 min    Pt received in bed, requesting assistance to BR. MinA to stand and ambulated w/ contact guard. C/o some back pain, otherwise asymptomatic. Pt left in BR w/ instructions to pull call string. RN aware.  Tracie [name] Mobility Specialist Please contact via Special educational needs teacher or Rehab office at [phone]

## 2023-02-25 NOTE — Care Management Obs Status (Signed)
MEDICARE OBSERVATION STATUS NOTIFICATION   Patient Details  Name: Tracie [name] MRN: [id number] Date of Birth: June 27, 1960   Medicare Observation Status Notification Given:  Yes    Ronny Bacon, RN 02/25/2023, 3:37 PM

## 2023-02-26 ENCOUNTER — Observation Stay (HOSPITAL_COMMUNITY): Payer: PPO

## 2023-02-26 ENCOUNTER — Encounter (HOSPITAL_COMMUNITY): Payer: Self-pay | Admitting: Internal Medicine

## 2023-02-26 DIAGNOSIS — I4819 Other persistent atrial fibrillation: Secondary | ICD-10-CM | POA: Diagnosis not present

## 2023-02-26 DIAGNOSIS — Z79899 Other long term (current) drug therapy: Secondary | ICD-10-CM | POA: Diagnosis not present

## 2023-02-26 DIAGNOSIS — I119 Hypertensive heart disease without heart failure: Secondary | ICD-10-CM | POA: Diagnosis not present

## 2023-02-26 DIAGNOSIS — D72825 Bandemia: Secondary | ICD-10-CM | POA: Diagnosis not present

## 2023-02-26 DIAGNOSIS — M5430 Sciatica, unspecified side: Secondary | ICD-10-CM | POA: Diagnosis not present

## 2023-02-26 DIAGNOSIS — M549 Dorsalgia, unspecified: Secondary | ICD-10-CM | POA: Diagnosis not present

## 2023-02-26 DIAGNOSIS — Z888 Allergy status to other drugs, medicaments and biological substances status: Secondary | ICD-10-CM | POA: Diagnosis not present

## 2023-02-26 DIAGNOSIS — Z7982 Long term (current) use of aspirin: Secondary | ICD-10-CM | POA: Diagnosis not present

## 2023-02-26 DIAGNOSIS — E785 Hyperlipidemia, unspecified: Secondary | ICD-10-CM | POA: Diagnosis not present

## 2023-02-26 DIAGNOSIS — Z6841 Body Mass Index (BMI) 40.0 and over, adult: Secondary | ICD-10-CM | POA: Diagnosis not present

## 2023-02-26 DIAGNOSIS — D72829 Elevated white blood cell count, unspecified: Secondary | ICD-10-CM | POA: Diagnosis not present

## 2023-02-26 DIAGNOSIS — E876 Hypokalemia: Secondary | ICD-10-CM | POA: Diagnosis not present

## 2023-02-26 DIAGNOSIS — S32010A Wedge compression fracture of first lumbar vertebra, initial encounter for closed fracture: Secondary | ICD-10-CM | POA: Diagnosis not present

## 2023-02-26 DIAGNOSIS — M1611 Unilateral primary osteoarthritis, right hip: Secondary | ICD-10-CM | POA: Diagnosis not present

## 2023-02-26 DIAGNOSIS — M4856XA Collapsed vertebra, not elsewhere classified, lumbar region, initial encounter for fracture: Secondary | ICD-10-CM | POA: Diagnosis not present

## 2023-02-26 DIAGNOSIS — E119 Type 2 diabetes mellitus without complications: Secondary | ICD-10-CM | POA: Diagnosis not present

## 2023-02-26 DIAGNOSIS — Z7901 Long term (current) use of anticoagulants: Secondary | ICD-10-CM | POA: Diagnosis not present

## 2023-02-26 DIAGNOSIS — Z556 Problems related to health literacy: Secondary | ICD-10-CM | POA: Diagnosis not present

## 2023-02-26 DIAGNOSIS — W19XXXA Unspecified fall, initial encounter: Secondary | ICD-10-CM | POA: Diagnosis present

## 2023-02-26 DIAGNOSIS — Z86718 Personal history of other venous thrombosis and embolism: Secondary | ICD-10-CM | POA: Diagnosis not present

## 2023-02-26 DIAGNOSIS — I700 Atherosclerosis of aorta: Secondary | ICD-10-CM | POA: Diagnosis not present

## 2023-02-26 DIAGNOSIS — M810 Age-related osteoporosis without current pathological fracture: Secondary | ICD-10-CM | POA: Diagnosis not present

## 2023-02-26 DIAGNOSIS — I4891 Unspecified atrial fibrillation: Secondary | ICD-10-CM | POA: Diagnosis not present

## 2023-02-26 DIAGNOSIS — Z7984 Long term (current) use of oral hypoglycemic drugs: Secondary | ICD-10-CM | POA: Diagnosis not present

## 2023-02-26 DIAGNOSIS — M62830 Muscle spasm of back: Secondary | ICD-10-CM | POA: Diagnosis not present

## 2023-02-26 DIAGNOSIS — M47816 Spondylosis without myelopathy or radiculopathy, lumbar region: Secondary | ICD-10-CM | POA: Diagnosis not present

## 2023-02-26 DIAGNOSIS — Z885 Allergy status to narcotic agent status: Secondary | ICD-10-CM | POA: Diagnosis not present

## 2023-02-26 DIAGNOSIS — Z87891 Personal history of nicotine dependence: Secondary | ICD-10-CM | POA: Diagnosis not present

## 2023-02-26 LAB — GLUCOSE, CAPILLARY
Glucose-Capillary: 103 mg/dL — ABNORMAL HIGH (ref 70–99)
Glucose-Capillary: 113 mg/dL — ABNORMAL HIGH (ref 70–99)
Glucose-Capillary: 127 mg/dL — ABNORMAL HIGH (ref 70–99)
Glucose-Capillary: 111 mg/dL — ABNORMAL HIGH (ref 70–99)

## 2023-02-26 LAB — BASIC METABOLIC PANEL
Anion gap: 10 (ref 5–15)
BUN: 12 mg/dL (ref 8–23)
CO2: 23 mmol/L (ref 22–32)
Calcium: 8.5 mg/dL — ABNORMAL LOW (ref 8.9–10.3)
Chloride: 102 mmol/L (ref 98–111)
Creatinine, Ser: 0.68 mg/dL (ref 0.44–1.00)
GFR, Estimated: >60 mL/min (ref >60)
Glucose, Bld: 121 mg/dL — ABNORMAL HIGH (ref 70–99)
Potassium: 4.2 mmol/L (ref 3.5–5.1)
Sodium: 135 mmol/L (ref 135–145)

## 2023-02-26 LAB — CBC
HCT: 46.8 % — ABNORMAL HIGH (ref 36.0–46.0)
Hemoglobin: 14.6 g/dL (ref 12.0–15.0)
MCH: 28 pg (ref 26.0–34.0)
MCHC: 31.2 g/dL (ref 30.0–36.0)
MCV: 89.7 fL (ref 80.0–100.0)
Platelets: 287 10*3/uL (ref 150–400)
RBC: 5.22 MIL/uL — ABNORMAL HIGH (ref 3.87–5.11)
RDW: 14.1 % (ref 11.5–15.5)
WBC: 11.6 10*3/uL — ABNORMAL HIGH (ref 4.0–10.5)
nRBC: 0 % (ref 0.0–0.2)

## 2023-02-26 LAB — MAGNESIUM: Magnesium: 1.7 mg/dL (ref 1.7–2.4)

## 2023-02-26 MED ORDER — GADOBUTROL 1 MMOL/ML IV SOLN
10.0000 mL | Freq: Once | INTRAVENOUS | Status: AC | PRN
  Administered 2023-02-26: 10 mL via INTRAVENOUS

## 2023-02-26 MED ORDER — METOPROLOL SUCCINATE ER 50 MG PO TB24
50.0000 mg | ORAL_TABLET | Freq: Two times a day (BID) | ORAL | Status: DC
  Administered 2023-02-26 – 2023-03-02 (×8): 50 mg via ORAL
  Filled 2023-02-26 (×8): qty 1

## 2023-02-26 MED ORDER — MAGNESIUM OXIDE -MG SUPPLEMENT 400 (240 MG) MG PO TABS
400.0000 mg | ORAL_TABLET | Freq: Two times a day (BID) | ORAL | Status: AC
  Administered 2023-02-26 (×2): 400 mg via ORAL
  Filled 2023-02-26 (×2): qty 1

## 2023-02-26 MED ORDER — DILTIAZEM HCL ER COATED BEADS 120 MG PO CP24
120.0000 mg | ORAL_CAPSULE | Freq: Every day | ORAL | Status: DC
  Administered 2023-02-26 – 2023-02-27 (×2): 120 mg via ORAL
  Filled 2023-02-26 (×2): qty 1

## 2023-02-26 NOTE — TOC Progression Note (Signed)
Transition of Care [facility]) - Progression Note    Patient Details  Name: Lee-Ann Cavazos MRN: [id number] Date of Birth: [birth year]  Transition of Care [facility] CM/SW Contact  Lawerance Sabal, RN Phone Number: 02/26/2023, 2:37 PM  Clinical Narrative:     Spoke w patient and spouse at bedside. Patient states that she has rollator, RW and cane at home, declines additional DME needs.  Discussed needs for DC. Confirmed that she has declined SNF, offered [facility] services and she declines HH. Spouse suggested she have HH however she continues to decline HH set up.   Expected Discharge Plan: Home/Self Care Barriers to Discharge: Continued Medical Work up  Expected Discharge Plan and Services In-house Referral: Clinical Social Work Discharge Planning Services: CM Consult   Living arrangements for the past 2 months: Apartment                                       Social Determinants of Health (SDOH) Interventions SDOH Screenings   Food Insecurity: No Food Insecurity (02/24/2023)  Housing: Low Risk  (02/24/2023)  Transportation Needs: No Transportation Needs (02/24/2023)  Utilities: Not At Risk (02/24/2023)  Depression (PHQ2-9): Low Risk  (02/26/2020)  Tobacco Use: Medium Risk (02/25/2023)    Readmission Risk Interventions     No data to display

## 2023-02-26 NOTE — Progress Notes (Signed)
[facility] PROGRESS NOTE    Progress Note  Pablita Canipe  YWV:[id number] DOB: 1960/08/27 DOA: 02/24/2023 PCP: Aliene Beams, MD     Brief Narrative:   Tosheba Keylon is an 62 y.o. female past medical history of hypertension diabetes mellitus type 2, chronic atrial fibrillation and history of DVT on apixaban, [name] seen in the ED in 02/21/2023 for back pain diagnosed with sciatica sent home with medications, saw her PCP but was given muscle relaxer and is steroid taper comes to the ED complaining of back pain was found to be in A-fib with RVR.  She says she was getting better but the pain came back worst no neurological symptoms.  Assessment/Plan:   Atrial fibrillation with RVR (HCC): Follow-up with cardiology in 2021 at that time she declined cardioversion.   In 2021 anticoagulation she declined anticoagulation. Now on metoprolol, rate controlled. Chads Vascor greater than 4. 2D echo was done that showed an EF of 60% no wall motion abnormality. Try to keep potassium greater than 4 magnesium greater than 2. Use IV metoprolol as needed. Cardiology has been consulted.  Lumbar pain/L1 compression fracture/osteoporosis: MRI of the lumbar spine showed acute/subacute inferior endplate compression fracture of L1 with moderate height loss and 5 mm retropulsion of the posterior cortex.  Also there was a irregular T1-T2 L3 and L4 vertebral body lesion concerning for possible metastatic disease.   Repeat MRI with contrast of the lumbar spine to rule out metastatic disease.  Hypokalemia/hypomagnesemia: Replete recheck magnesium. Check a potassium greater than 4 magnesium greater than 2.  Leukocytosis: Likely due to steroids she has remained afebrile. On steroids for 2 more days.  Controlled diabetes mellitus type 2: Holding glipizide continue Jardiance. Continue long-acting insulin plus sliding scale CBGs ACH S, A1c of 7.0.  Essential hypertension: Continue Norvasc  holding Cozaar.  Hyperlipidemia: Continue statins.   DVT prophylaxis: Eliquis Family Communication:none Status is: Observation The patient remains OBS appropriate and will d/c before 2 midnights.    Code Status:     Code Status Orders  (From admission, onward)           Start     Ordered   02/24/23 0737  Full code  Continuous       Question:  By:  Answer:  Consent: discussion documented in EHR   02/24/23 0739           Code Status History     This patient has a current code status but no historical code status.         IV Access:   Peripheral IV   Procedures and diagnostic studies:   ECHOCARDIOGRAM COMPLETE  Result Date: 02/25/2023    ECHOCARDIOGRAM REPORT   Patient Name:   OHANNA CATE Jiminez Date of Exam: 02/25/2023 Medical Rec #:  [id number]           Height:       70.5 in Accession #:    [id number]          Weight:       319.0 lb Date of Birth:  [birth year]           BSA:          2.559 m Patient Age:    62 years            BP:           123/74 mmHg Patient Gender: F  HR:           100 bpm. Exam Location:  Inpatient Procedure: 2D Echo, Cardiac Doppler, Color Doppler and Intracardiac            Opacification Agent Indications:    A-fib  History:        Patient has no prior history of Echocardiogram examinations.                 Arrythmias:Atrial Fibrillation; Risk Factors:Hypertension,                 Dyslipidemia, Diabetes and Former Smoker.  Sonographer:    Dondra Prader RVT RCS Referring Phys: [phone] [facility]  Sonographer Comments: Technically challenging study due to limited acoustic windows, Technically difficult study due to poor echo windows and suboptimal apical window. Image acquisition challenging due to patient body habitus and Image acquisition challenging due to respiratory motion. Patient supine; limited and poor windows IMPRESSIONS  1. Left ventricular ejection fraction, by estimation, is 60 to 65%. The left ventricle has normal  function. The left ventricle has no regional wall motion abnormalities. There is mild left ventricular hypertrophy.  2. Right ventricular systolic function reduced. The right ventricular size is grossly normal. There is normal pulmonary artery systolic pressure.  3. Left atrial size was mildly dilated.  4. Right atrial size was moderately dilated.  5. The mitral valve is normal in structure. Mild mitral valve regurgitation. No evidence of mitral stenosis.  6. The aortic valve was not well visualized. Aortic valve regurgitation is trivial. No aortic stenosis is present.  7. Aortic dilatation noted. There is dilatation of the ascending aorta, measuring 39 mm.  8. The inferior vena cava is dilated in size with >50% respiratory variability, suggesting right atrial pressure of 8 mmHg. Comparison(s): No prior Echocardiogram. FINDINGS  Left Ventricle: Left ventricular ejection fraction, by estimation, is 60 to 65%. The left ventricle has normal function. The left ventricle has no regional wall motion abnormalities. Definity contrast agent was given IV to delineate the left ventricular  endocardial borders. The left ventricular internal cavity size was normal in size. There is mild left ventricular hypertrophy. Left ventricular diastolic function could not be evaluated due to atrial fibrillation. Right Ventricle: The right ventricular size is grossly normal. Right vetricular wall thickness was not well visualized. Right ventricular systolic function reduced. There is normal pulmonary artery systolic pressure. The tricuspid regurgitant velocity is  2.52 m/s, and with an assumed right atrial pressure of 8 mmHg, the estimated right ventricular systolic pressure is 33.4 mmHg. Left Atrium: Left atrial size was mildly dilated. Right Atrium: Right atrial size was moderately dilated. Pericardium: There is no evidence of pericardial effusion. Mitral Valve: The mitral valve is normal in structure. Mild mitral valve regurgitation. No  evidence of mitral valve stenosis. Tricuspid Valve: The tricuspid valve is normal in structure. Tricuspid valve regurgitation is mild . No evidence of tricuspid stenosis. Aortic Valve: The aortic valve was not well visualized. Aortic valve regurgitation is trivial. No aortic stenosis is present. Aortic valve mean gradient measures 2.0 mmHg. Aortic valve peak gradient measures 4.1 mmHg. Aortic valve area, by VTI measures 3.21 cm. Pulmonic Valve: The pulmonic valve was not well visualized. Pulmonic valve regurgitation is not visualized. No evidence of pulmonic stenosis. Aorta: The aortic root is normal in size and structure and aortic dilatation noted. There is dilatation of the ascending aorta, measuring 39 mm. Venous: The inferior vena cava is dilated in size with greater than 50% respiratory  variability, suggesting right atrial pressure of 8 mmHg. IAS/Shunts: The atrial septum is grossly normal.  LEFT VENTRICLE PLAX 2D LVIDd:         5.00 cm   Diastology LVIDs:         2.90 cm   LV e' medial:    13.10 cm/s LV PW:         1.30 cm   LV E/e' medial:  6.1 LV IVS:        1.30 cm   LV e' lateral:   13.20 cm/s LVOT diam:     2.30 cm   LV E/e' lateral: 6.1 LV SV:         55 LV SV Index:   21 LVOT Area:     4.15 cm  RIGHT VENTRICLE         IVC TAPSE (M-mode): 1.3 cm  IVC diam: 2.40 cm LEFT ATRIUM              Index        RIGHT ATRIUM           Index LA diam:        4.90 cm  1.91 cm/m   RA Area:     28.80 cm LA Vol (A2C):   121.0 ml 47.29 ml/m  RA Volume:   97.30 ml  38.02 ml/m LA Vol (A4C):   83.6 ml  32.67 ml/m LA Biplane Vol: 101.0 ml 39.47 ml/m  AORTIC VALVE                    PULMONIC VALVE AV Area (Vmax):    3.31 cm     PV Vmax:       0.76 m/s AV Area (Vmean):   3.07 cm     PV Peak grad:  2.3 mmHg AV Area (VTI):     3.21 cm AV Vmax:           100.82 cm/s AV Vmean:          69.600 cm/s AV VTI:            0.170 m AV Peak Grad:      4.1 mmHg AV Mean Grad:      2.0 mmHg LVOT Vmax:         80.22 cm/s LVOT Vmean:         51.380 cm/s LVOT VTI:          0.132 m LVOT/AV VTI ratio: 0.77  AORTA Ao Root diam: 3.70 cm Ao Asc diam:  3.90 cm MITRAL VALVE               TRICUSPID VALVE MV Area (PHT): 5.50 cm    TR Peak grad:   25.4 mmHg MV Decel Time: 138 msec    TR Vmax:        252.00 cm/s MV E velocity: 80.20 cm/s                            SHUNTS                            Systemic VTI:  0.13 m                            Systemic Diam: 2.30 cm Sunit Tolia Electronically signed by Tessa Lerner Signature Date/Time: 02/25/2023/3:20:05 PM  Final    MR LUMBAR SPINE WO CONTRAST  Result Date: 02/25/2023 CLINICAL DATA:  Low back pain, symptoms persist with > 6 wks treatment. EXAM: MRI LUMBAR SPINE WITHOUT CONTRAST TECHNIQUE: Multiplanar, multisequence MR imaging of the lumbar spine was performed. No intravenous contrast was administered. COMPARISON:  Lumbar spine radiographs 02/24/2023. FINDINGS: Segmentation: Conventional numbering is assumed with 5 non-rib-bearing, lumbar type vertebral bodies. Alignment:  Normal. Vertebrae: Inferior endplate compression fracture of the L1 vertebral body with moderate anterior height loss, associated marrow edema and 5 mm retropulsion of the posterior cortex. Irregular T1 hypointense and T2 hyperintense lesions in the L3 and L4 vertebral bodies (sagittal images 9 series 4 and 3). Modic type 1 degenerative endplate marrow signal changes at L4-5. Conus medullaris and cauda equina: Conus extends to the L1 level. Conus and cauda equina appear normal. Paraspinal and other soft tissues: Mild fatty atrophy of the paraspinal muscles. Disc levels: T12-L1:  Small right central disc protrusion. L1-L2:  Mild bilateral facet arthropathy. L2-L3: Small disc bulge and mild bilateral facet arthropathy. No spinal canal stenosis or neural foraminal narrowing. L3-L4: Small disc bulge and mild bilateral facet arthropathy. No spinal canal stenosis or neural foraminal narrowing. L4-L5: Small disc bulge and mild bilateral  facet arthropathy. No spinal canal stenosis or neural foraminal narrowing. L5-S1:  Mild bilateral facet arthropathy. IMPRESSION: 1. Acute/subacute inferior endplate compression fracture of the L1 vertebral body with moderate anterior height loss and 5 mm retropulsion of the posterior cortex. 2. Irregular T1 hypointense and T2 hyperintense lesions in the L3 and L4 vertebral bodies, concerning for possible metastatic disease. Recommend further evaluation with contrast-enhanced lumbar spine MRI. 3. Mild multilevel lumbar spondylosis without spinal canal stenosis or neural foraminal narrowing. Electronically Signed   By: Orvan Falconer M.D.   On: 02/25/2023 09:57   DG Lumbar Spine 2-3 Views  Result Date: 02/24/2023 CLINICAL DATA:  620110 Intractable back pain 620110. EXAM: LUMBAR SPINE - 2-3 VIEW COMPARISON:  12/02/2020. FINDINGS: There are 5 nonrib-bearing lumbar vertebrae. There is loss of lumbar lordosis, which may be on the basis of positioning or due to muscle spasm. There is focal kyphosis at L1 level due to moderate anterior wedging deformity of L1 vertebrae, which is new since the prior study. No significant retropulsion or spinal canal compromise. No spondylolisthesis. Moderate anterior wedging deformity of L1 vertebrae. Remaining vertebral body heights are maintained. No aggressive osseous lesion. Mild multilevel degenerative changes in the form of facet arthropathy and marginal osteophyte formation. Sacroiliac joints are symmetric. Visualized soft tissues are within normal limits. IMPRESSION: *Age indeterminate moderate anterior wedging deformity of L1 vertebra, new since the prior study from 2022. No significant retropulsion or spinal canal compromise. Correlate clinically to determine the need for additional imaging with MRI lumbar spine. Electronically Signed   By: Jules Schick M.D.   On: 02/24/2023 11:46   DG HIP UNILAT WITH PELVIS 1V RIGHT  Result Date: 02/24/2023 CLINICAL DATA:  Lower back  pain radiating to right hip EXAM: DG HIP (WITH OR WITHOUT PELVIS) 1V RIGHT COMPARISON:  12/02/2020 FINDINGS: Frontal view of the pelvis and frogleg lateral view of the right hip are obtained. On the frontal view of the pelvis the bilateral greater trochanters are excluded by collimation. There are no acute displaced fractures identified on this examination limited by technique and patient body habitus. There is severe bilateral hip osteoarthritis, with severe joint space narrowing, marginal osteophyte formation, and bony remodeling of the femoral head and acetabuli bilaterally. Progressive changes are seen  on the right since prior study. Sacroiliac joints are normal. IMPRESSION: 1. Study limited by technique and body habitus. 2. Severe bilateral hip osteoarthritis, which has progressed on the right since prior study. No prior imaging of the left hip. 3. No evidence of displaced fracture. Electronically Signed   By: Sharlet Salina M.D.   On: 02/24/2023 11:45     Medical Consultants:   None.   Subjective:    Tempie Delapuente is her back pain is improved  Objective:    Vitals:   02/25/23 2029 02/26/23 0018 02/26/23 0430 02/26/23 0722  BP: (!) 136/96 (!) 157/119 (!) 144/99 (!) 115/96  Pulse: (!) 103 95 91 71  Resp:      Temp: 98.3 F (36.8 C)  97.8 F (36.6 C) 97.6 F (36.4 C)  TempSrc: Oral  Oral Oral  SpO2: 93% 92% 93%   Weight:   (!) 143.6 kg   Height:       SpO2: 93 %  No intake or output data in the 24 hours ending 02/26/23 0807  Filed Weights   02/24/23 0442 02/26/23 0430  Weight: (!) 144.7 kg (!) 143.6 kg    Exam: General exam: In no acute distress. Respiratory system: Good air movement and clear to auscultation. Cardiovascular system: S1 & S2 heard, RRR. No JVD. Gastrointestinal system: Abdomen is nondistended, soft and nontender.  Extremities: No pedal edema. Skin: No rashes, lesions or ulcers Psychiatry: Judgement and insight appear normal. Mood & affect  appropriate. Data Reviewed:    Labs: Basic Metabolic Panel: Recent Labs  Lab 02/24/23 0548 02/24/23 0748 02/25/23 0112  NA 135  --  138  K 3.3*  --  3.2*  CL 104  --  106  CO2 22  --  25  GLUCOSE 186*  --  175*  BUN 13  --  12  CREATININE 0.69  --  0.70  CALCIUM 8.6*  --  8.7*  MG  --  1.6*  --    GFR Estimated Creatinine Clearance: 111.4 mL/min (by C-G formula based on SCr of 0.7 mg/dL). Liver Function Tests: No results for input(s): "AST", "ALT", "ALKPHOS", "BILITOT", "PROT", "ALBUMIN" in the last 168 hours. No results for input(s): "LIPASE", "AMYLASE" in the last 168 hours. No results for input(s): "AMMONIA" in the last 168 hours. Coagulation profile No results for input(s): "INR", "PROTIME" in the last 168 hours. COVID-19 Labs  No results for input(s): "DDIMER", "FERRITIN", "LDH", "CRP" in the last 72 hours.  No results found for: "SARSCOV2NAA"  CBC: Recent Labs  Lab 02/24/23 0548 02/25/23 0112  WBC 14.2* 13.7*  NEUTROABS 11.6*  --   HGB 14.1 13.5  HCT 45.7 43.7  MCV 89.4 90.1  PLT 316 291   Cardiac Enzymes: No results for input(s): "CKTOTAL", "CKMB", "CKMBINDEX", "TROPONINI" in the last 168 hours. BNP (last 3 results) No results for input(s): "PROBNP" in the last 8760 hours. CBG: Recent Labs  Lab 02/25/23 0739 02/25/23 1156 02/25/23 1612 02/25/23 2109 02/26/23 0747  GLUCAP 193* 93 136* 116* 103*   D-Dimer: No results for input(s): "DDIMER" in the last 72 hours. Hgb A1c: Recent Labs    02/24/23 0748  HGBA1C 7.0*   Lipid Profile: Recent Labs    02/25/23 0112  CHOL 143  HDL 47  LDLCALC 80  TRIG 79  CHOLHDL 3.0   Thyroid function studies: Recent Labs    02/24/23 0748  TSH 1.054   Anemia work up: No results for input(s): "VITAMINB12", "FOLATE", "FERRITIN", "TIBC", "  IRON", "RETICCTPCT" in the last 72 hours. Sepsis Labs: Recent Labs  Lab 02/24/23 0548 02/25/23 0112  WBC 14.2* 13.7*   Microbiology No results found for this or  any previous visit (from the past 240 hour(s)).   Medications:    apixaban  5 mg Oral BID   aspirin EC  81 mg Oral Daily   empagliflozin  25 mg Oral Daily   insulin aspart  0-15 Units Subcutaneous TID WC   insulin aspart  0-5 Units Subcutaneous QHS   insulin aspart  4 Units Subcutaneous TID WC   insulin detemir  5 Units Subcutaneous QHS   lidocaine  1 patch Transdermal Daily   losartan  100 mg Oral Daily   metoprolol tartrate  100 mg Oral BID   predniSONE  10 mg Oral Q breakfast   Continuous Infusions:    LOS: 0 days   Marinda Elk  [facility]  02/26/2023, 8:07 AM

## 2023-02-26 NOTE — Care Management (Signed)
Requested test claim for DOACs

## 2023-02-26 NOTE — TOC Initial Note (Signed)
Transition of Care [facility]) - Initial/Assessment Note    Patient Details  Name: Tracie [name] MRN: [id number] Date of Birth: [birth year]  Transition of Care [facility]) CM/SW Contact:    Delilah Shan, LCSWA Phone Number: 02/26/2023, 1:14 PM  Clinical Narrative:                  CSW received consult for possible SNF placement at time of discharge. CSW spoke with patient and patients spouse at bedside regarding PT recommendation of SNF placement at time of discharge. Patient reports PTA she comes from home with spouse.  Patient expressed understanding of PT recommendation and politely declined SNF placement at time of discharge. Patient informed CSW plan is for her to return home with support of spouse.No further questions reported at this time. CSW to continue to follow and assist with discharge planning needs.    Barriers to Discharge: Continued Medical Work up   Patient Goals and CMS Choice Patient states their goals for this hospitalization and ongoing recovery are:: to return home when medically ready   Choice offered to / list presented to : Patient      Expected Discharge Plan and Services In-house Referral: Clinical Social Work     Living arrangements for the past 2 months: Apartment                                      Prior Living Arrangements/Services Living arrangements for the past 2 months: Apartment Lives with:: Spouse Patient language and need for interpreter reviewed:: Yes Do you feel safe going back to the place where you live?: Yes      Need for Family Participation in Patient Care: Yes (Comment) Care giver support system in place?: Yes (comment)   Criminal Activity/Legal Involvement Pertinent to Current Situation/Hospitalization: No - Comment as needed  Activities of Daily Living   ADL Screening (condition at time of admission) Independently performs ADLs?: No Does the patient have a NEW difficulty with bathing/dressing/toileting/self-feeding  that is expected to last >3 days?: Yes (Initiates electronic notice to provider for possible OT consult) (due to pain in back stated Nove 3,2024) Does the patient have a NEW difficulty with getting in/out of bed, walking, or climbing stairs that is expected to last >3 days?: Yes (Initiates electronic notice to provider for possible PT consult) (been sleeping on couch since Nov 3,2024 unable to get in and out of bed) Does the patient have a NEW difficulty with communication that is expected to last >3 days?: No Is the patient deaf or have difficulty hearing?: No Does the patient have difficulty seeing, even when wearing glasses/contacts?: No Does the patient have difficulty concentrating, remembering, or making decisions?: No  Permission Sought/Granted Permission sought to share information with : Case Manager, Magazine features editor, Family Supports                Emotional Assessment Appearance:: Appears stated age Attitude/Demeanor/Rapport: Gracious Affect (typically observed): Calm Orientation: : Oriented to Self, Oriented to Place, Oriented to  Time, Oriented to Situation Alcohol / Substance Use: Not Applicable Psych Involvement: No (comment)  Admission diagnosis:  Atrial fibrillation with rapid ventricular response (HCC) [I48.91] Atrial fibrillation with RVR (HCC) [I48.91] Intractable back pain [M54.9] Patient Active Problem List   Diagnosis Date Noted   Atrial fibrillation with RVR (HCC) 02/24/2023   Controlled type 2 diabetes mellitus without complication, without long-term current use of insulin (HCC)  02/24/2023   Lumbar pain 02/24/2023   Leukocytosis 02/24/2023   Hypokalemia 02/24/2023   Myalgia 10/27/2018   Arthralgia of knee, right 10/27/2018   Varicose veins of both lower extremities with pain 07/16/2018   Hyperlipidemia 06/03/2018   Essential hypertension, benign 06/03/2018   PCP:  Aliene Beams, MD Pharmacy:   [facility] [phone] - [place],  [place] - [address] AT [facility] OF [address] [place] [address] Phone: [phone] Fax: [phone]     Social Determinants of Health (SDOH) Social History: SDOH Screenings   Food Insecurity: No Food Insecurity (02/24/2023)  Housing: Low Risk  (02/24/2023)  Transportation Needs: No Transportation Needs (02/24/2023)  Utilities: Not At Risk (02/24/2023)  Depression (PHQ2-9): Low Risk  (02/26/2020)  Tobacco Use: Medium Risk (02/25/2023)   SDOH Interventions:     Readmission Risk Interventions     No data to display

## 2023-02-26 NOTE — Plan of Care (Signed)
  Problem: Education: Goal: Ability to describe self-care measures that may prevent or decrease complications (Diabetes Survival Skills Education) will improve Outcome: Progressing Goal: Individualized Educational Video(s) Outcome: Progressing   Problem: Coping: Goal: Ability to adjust to condition or change in health will improve Outcome: Progressing   Problem: Fluid Volume: Goal: Ability to maintain a balanced intake and output will improve Outcome: Progressing   Problem: Health Behavior/Discharge Planning: Goal: Ability to identify and utilize available resources and services will improve Outcome: Progressing Goal: Ability to manage health-related needs will improve Outcome: Progressing   Problem: Metabolic: Goal: Ability to maintain appropriate glucose levels will improve Outcome: Completed/Met   Problem: Nutritional: Goal: Maintenance of adequate nutrition will improve Outcome: Completed/Met Goal: Progress toward achieving an optimal weight will improve Outcome: Progressing   Problem: Skin Integrity: Goal: Risk for impaired skin integrity will decrease Outcome: Progressing   Problem: Tissue Perfusion: Goal: Adequacy of tissue perfusion will improve Outcome: Progressing   Problem: Education: Goal: Knowledge of General Education information will improve Description: Including pain rating scale, medication(s)/side effects and non-pharmacologic comfort measures Outcome: Progressing   Problem: Health Behavior/Discharge Planning: Goal: Ability to manage health-related needs will improve Outcome: Progressing   Problem: Clinical Measurements: Goal: Ability to maintain clinical measurements within normal limits will improve Outcome: Progressing Goal: Will remain free from infection Outcome: Progressing Goal: Diagnostic test results will improve Outcome: Progressing Goal: Respiratory complications will improve Outcome: Not Applicable Goal: Cardiovascular  complication will be avoided Outcome: Progressing   Problem: Activity: Goal: Risk for activity intolerance will decrease Outcome: Progressing   Problem: Nutrition: Goal: Adequate nutrition will be maintained Outcome: Completed/Met   Problem: Coping: Goal: Level of anxiety will decrease Outcome: Progressing   Problem: Elimination: Goal: Will not experience complications related to bowel motility Outcome: Progressing Goal: Will not experience complications related to urinary retention Outcome: Progressing   Problem: Pain Management: Goal: General experience of comfort will improve Outcome: Progressing   Problem: Safety: Goal: Ability to remain free from injury will improve Outcome: Progressing   Problem: Skin Integrity: Goal: Risk for impaired skin integrity will decrease Outcome: Progressing

## 2023-02-26 NOTE — Progress Notes (Signed)
PT Cancellation Note  Patient Details Name: Tracie [name] MRN: [id number] DOB: 16-Sep-1960   Cancelled Treatment:    Reason Eval/Treat Not Completed: Patient at procedure or test/unavailable. Will continue attempts.   Angelina Ok [facility] 02/26/2023, 10:12 AM Skip Mayer PT Acute [employer] [phone]

## 2023-02-26 NOTE — Consult Note (Addendum)
Cardiology Consultation   Patient ID: Tracie [name] MRN: [id number]; DOB: [birth year]  Admit date: 02/24/2023 Date of Consult: 02/26/2023  PCP:  Aliene Beams, MD   [facility] Providers Cardiologist:  Parke Poisson, MD      Will need to re-establish with new cardiologist due to inability to achieve a therapeutic relationship  Patient Profile:   Tracie [name] is a 62 y.o. 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 David Stall.  History of Present Illness:   Tracie [name] remotely had an admission in 2010 for an abdominal pain with question of celiac artery dissection/thrombus in 2010 in the setting of recent trauma with shopping cart injury. She was treated with Coumadin and intended to follow-up with Dr. Edilia Bo. I am unable to review remote outpatient notes. Chart says h/o DVT 2010 under PMH but unable to substantiate this and patient denies (I think this probably referenced the celiac event). Tracie [name] was first recognized to have atrial fibrillation RVR during routine PCP visit 02/2020. She was no longer on anticoagulation at that time. Primary care increased metoprolol and added Eliquis and patient had expedited office visit 02/29/20 with Dr. Jacques Navy. At that visit, notes indicate the patient wished to defer any treatment, investigation or adjustments to her regimen until their schedule was less busy. The risks of deferring management were discussed. She declined to make any medication changes and did not schedule echocardiogram or follow-up. The patient states she was very distressed at those visits and thought her doctors were truly trying to harm her with these abrupt medicine changes. We had a conversation about the  rationale behind the very appropriate recommendations and that her care team was making medicine changes with the intention of preventing further harm. She switched PCPs and has been following at [facility].  We do not really have access to any interim primary care records as she has been followed outside [facility]. She has not been on Eliquis in many years and generally would prefer to avoid anticoagulation going forward. She reports she has been taking Toprol 50mg  daily. Her father died when his heart exploded in his 93s per his report. He was being treated for HTN though wasn't regularly taking his medications and known to have enlarged heart.  She was evaluated in the ED 02/21/23 for sciatica at which time HRs were recorded as 78, 93.  She was discharged with pain medication and lidoderm patch. She returned to the ED 02/24/23 with continued back pain. She was incidentally noted to be in AF RVR without any recent chest pain or palpitations. She has history of intermittent LE edema per her report which had been worse recently in the setting of not being able to elevate her legs due to her back pain. She had also noticed some intermittent dyspnea on/off for several weeks. Labs demonstrated K of 3.2, Mg 1.6, glucose 175, leukocytosis of 13.7, UA glucose >500, normal TSH, BNP 135. She was treated with potassium/magnesium supplementation, Toradol, Zofran, pain medication, steroids, and started on diltiazem drip. Her metoprolol dose was increased to tartrate 100mg  BID upon admission. Diltiazem drip was weaned off this morning (HR 80s-90s). HR currently is in the 100s-1teens off diltiazem. MRI lumbar spine without contrast showed acute/subacute compression fracture, vertebral body lesions  that may be metastatic disease, so MRI with contrast is pending. CXR showed moderate-severe cardiomegaly increased from 2010 without overt fluid. 2D echo showed EF 60-65%, mild LVH, reduced RV function, mild LAE, mod RAE, mild MR, mild  dilation of ascending aorta, dilated IVC.   Past Medical History:  Diagnosis Date   Atrial fibrillation (HCC)    Celiac artery dissection (HCC)    Diabetes mellitus (HCC)    History of DVT (deep vein thrombosis) 2010   Hyperlipidemia    Hypertension    Morbid obesity (HCC)     Past Surgical History:  Procedure Laterality Date   ABDOMINAL HYSTERECTOMY     FRACTURE SURGERY Left    hand   KNEE ARTHROSCOPY Right 1990s     Home Medications:  Prior to Admission medications   Medication Sig Start Date End Date Taking? Authorizing Provider  amLODipine (NORVASC) 10 MG tablet Take 1 tablet (10 mg total) by mouth daily. 02/26/20  Yes Shade Flood, MD  aspirin EC 81 MG tablet Take 81 mg by mouth daily.   Yes [provider]  empagliflozin (JARDIANCE) 25 MG TABS tablet Take 25 mg by mouth daily.   Yes [provider]  HYDROcodone-acetaminophen (NORCO/VICODIN) 5-325 MG tablet Take 1 tablet by mouth every 6 (six) hours as needed for severe pain (pain score 7-10). 02/22/23  Yes Smoot, Sarah A, PA-C  ibuprofen (ADVIL) 800 MG tablet Take 1 tablet (800 mg total) by mouth 3 (three) times daily. 02/21/23  Yes Smoot, Sarah A, PA-C  losartan (COZAAR) 100 MG tablet TAKE 1 TABLET(100 MG) BY MOUTH DAILY 02/26/20  Yes Shade Flood, MD  metoprolol succinate (TOPROL-XL) 50 MG 24 hr tablet Take 50 mg by mouth daily. Take with or immediately following a meal.   Yes [provider]  predniSONE (DELTASONE) 10 MG tablet Take 10-60 mg by mouth See admin instructions. Take 60 mg (6 tablets) daily for 2 days, 50 mg (5 tablets) daily for 2 days, 40 mg (4 tablets) for 2 days, 30 mg (3 tablets) daily for 2 days, 20 mg ( 2 tablets) daily for 2 days, 10 mg (1 tablet) daily for 2 days.   Yes [provider]  vitamin B-12 (CYANOCOBALAMIN) 500 MCG tablet Take 500 mcg by mouth daily.   Yes [provider]  lidocaine (LIDODERM) 5 % Place 1 patch onto the skin daily. Remove &  Discard patch within 12 hours or as directed by MD Patient not taking: Reported on 02/24/2023 02/22/23   Smoot, Shawn Route, PA-C    Inpatient Medications: Scheduled Meds:  apixaban  5 mg Oral BID   empagliflozin  25 mg Oral Daily   insulin aspart  0-15 Units Subcutaneous TID WC   insulin aspart  0-5 Units Subcutaneous QHS   insulin aspart  4 Units Subcutaneous TID WC   insulin detemir  5 Units Subcutaneous QHS   lidocaine  1 patch Transdermal Daily   losartan  100 mg Oral Daily   magnesium oxide  400 mg Oral BID   metoprolol tartrate  100 mg Oral BID   predniSONE  10 mg Oral Q breakfast   Continuous Infusions:  PRN Meds: acetaminophen, cyclobenzaprine, HYDROcodone-acetaminophen, ketorolac, metoprolol tartrate, morphine injection, ondansetron (ZOFRAN) IV  Allergies:    Allergies  Allergen Reactions   Codeine Other (See Comments)    Unknown reaction   Crestor [Rosuvastatin] Other (See Comments)    Myalgias  Weakness    Social History:   Social History  Socioeconomic History   Marital status: Married    Spouse name: Not on file   Number of children: Not on file   Years of education: Not on file   Highest education level: Not on file  Occupational History   Not on file  Tobacco Use   Smoking status: Former    Current packs/day: 0.00    Average packs/day: 1 pack/day for 20.0 years (20.0 ttl pk-yrs)    Types: Cigarettes    Start date: 54    Quit date: 2010    Years since quitting: 14.8   Smokeless tobacco: Never  Vaping Use   Vaping status: Never Used  Substance and Sexual Activity   Alcohol use: Never   Drug use: Not Currently   Sexual activity: Yes  Other Topics Concern   Not on file  Social History Narrative   Not on file   Social Determinants of Health   Financial Resource Strain: Not on file  Food Insecurity: No Food Insecurity (02/24/2023)   Hunger Vital Sign    Worried About Running Out of Food in the Last Year: Never true    Ran Out of Food in  the Last Year: Never true  Transportation Needs: No Transportation Needs (02/24/2023)   PRAPARE - Administrator, Civil Service (Medical): No    Lack of Transportation (Non-Medical): No  Physical Activity: Not on file  Stress: Not on file  Social Connections: Not on file  Intimate Partner Violence: Not At Risk (02/24/2023)   Humiliation, Afraid, Rape, and Kick questionnaire    Fear of Current or Ex-Partner: No    Emotionally Abused: No    Physically Abused: No    Sexually Abused: No    Family History:    Family History  Problem Relation Age of Onset   Hypertension Mother    Heart attack Father    Heart disease Brother    Healthy Son    Breast cancer Neg Hx      ROS:  Please see the history of present illness.  All other ROS reviewed and negative.     Physical Exam/Data:   Vitals:   02/25/23 2029 02/26/23 0018 02/26/23 0430 02/26/23 0722  BP: (!) 136/96 (!) 157/119 (!) 144/99 (!) 115/96  Pulse: (!) 103 95 91 71  Resp:      Temp: 98.3 F (36.8 C)  97.8 F (36.6 C) 97.6 F (36.4 C)  TempSrc: Oral  Oral Oral  SpO2: 93% 92% 93%   Weight:   (!) 143.6 kg   Height:       No intake or output data in the 24 hours ending 02/26/23 1542    02/26/2023    4:30 AM 02/24/2023    4:42 AM 02/21/2023    8:33 PM  Last 3 Weights  Weight (lbs) 316 lb 9.3 oz 318 lb 15.7 oz 319 lb  Weight (kg) 143.6 kg 144.69 kg 144.697 kg     Body mass index is 44.77 kg/m.  General: Well developed, well nourished WF, in no acute distress. Head: Normocephalic, atraumatic, sclera non-icteric, no xanthomas, nares are without discharge. Neck: Negative for carotid bruits. JVP not elevated. Lungs: Clear bilaterally to auscultation without wheezes, rales, or rhonchi. Breathing is unlabored. Heart: Irregularly irregular, tachycardic, S1 S2 without murmurs, rubs, or gallops.  Abdomen: Soft, non-tender, non-distended with normoactive bowel sounds. No rebound/guarding. Extremities: No clubbing  or cyanosis. Soft puffy BLE edema - trace at sockline, mostly pedal. Distal pedal pulses are 2+ and  equal bilaterally. Neuro: Alert and oriented X 3. Moves all extremities spontaneously. Psych:  Responds to questions appropriately with a normal affect.   EKG:  The EKG was personally reviewed and demonstrates:  atrial fib 122bpm, nonspecific STT changes Telemetry:  Telemetry was personally reviewed and demonstrates:  AF RVR  Relevant CV Studies: 2d echo 02/25/23    1. Left ventricular ejection fraction, by estimation, is 60 to 65%. The  left ventricle has normal function. The left ventricle has no regional  wall motion abnormalities. There is mild left ventricular hypertrophy.   2. Right ventricular systolic function reduced. The right ventricular  size is grossly normal. There is normal pulmonary artery systolic  pressure.   3. Left atrial size was mildly dilated.   4. Right atrial size was moderately dilated.   5. The mitral valve is normal in structure. Mild mitral valve  regurgitation. No evidence of mitral stenosis.   6. The aortic valve was not well visualized. Aortic valve regurgitation  is trivial. No aortic stenosis is present.   7. Aortic dilatation noted. There is dilatation of the ascending aorta,  measuring 39 mm.   8. The inferior vena cava is dilated in size with >50% respiratory  variability, suggesting right atrial pressure of 8 mmHg.   Comparison(s): No prior Echocardiogram.    Laboratory Data:  High Sensitivity Troponin:  No results for input(s): "TROPONINIHS" in the last 720 hours.   Chemistry Recent Labs  Lab 02/24/23 0548 02/24/23 0748 02/25/23 0112 02/26/23 0922  NA 135  --  138 135  K 3.3*  --  3.2* 4.2  CL 104  --  106 102  CO2 22  --  25 23  GLUCOSE 186*  --  175* 121*  BUN 13  --  12 12  CREATININE 0.69  --  0.70 0.68  CALCIUM 8.6*  --  8.7* 8.5*  MG  --  1.6*  --  1.7  GFRNONAA >60  --  >60 >60  ANIONGAP 9  --  7 10    No results for  input(s): "PROT", "ALBUMIN", "AST", "ALT", "ALKPHOS", "BILITOT" in the last 168 hours. Lipids  Recent Labs  Lab 02/25/23 0112  CHOL 143  TRIG 79  HDL 47  LDLCALC 80  CHOLHDL 3.0    Hematology Recent Labs  Lab 02/24/23 0548 02/25/23 0112 02/26/23 0922  WBC 14.2* 13.7* 11.6*  RBC 5.11 4.85 5.22*  HGB 14.1 13.5 14.6  HCT 45.7 43.7 46.8*  MCV 89.4 90.1 89.7  MCH 27.6 27.8 28.0  MCHC 30.9 30.9 31.2  RDW 14.0 14.2 14.1  PLT 316 291 287   Thyroid  Recent Labs  Lab 02/24/23 0748  TSH 1.054    BNP Recent Labs  Lab 02/24/23 0548  BNP 135.6*    DDimer No results for input(s): "DDIMER" in the last 168 hours.   Radiology/Studies:  ECHOCARDIOGRAM COMPLETE  Result Date: 02/25/2023    ECHOCARDIOGRAM REPORT   Patient Name:   DOYLE ASKELSON Tennell Date of Exam: 02/25/2023 Medical Rec #:  [id number]           Height:       70.5 in Accession #:    [id number]          Weight:       319.0 lb Date of Birth:  1960/11/25           BSA:          2.559 m Patient Age:    63 years  BP:           123/74 mmHg Patient Gender: F                   HR:           100 bpm. Exam Location:  Inpatient Procedure: 2D Echo, Cardiac Doppler, Color Doppler and Intracardiac            Opacification Agent Indications:    A-fib  History:        Patient has no prior history of Echocardiogram examinations.                 Arrythmias:Atrial Fibrillation; Risk Factors:Hypertension,                 Dyslipidemia, Diabetes and Former Smoker.  Sonographer:    Dondra Prader RVT RCS Referring Phys: [phone] [facility]  Sonographer Comments: Technically challenging study due to limited acoustic windows, Technically difficult study due to poor echo windows and suboptimal apical window. Image acquisition challenging due to patient body habitus and Image acquisition challenging due to respiratory motion. Patient supine; limited and poor windows IMPRESSIONS  1. Left ventricular ejection fraction, by estimation, is 60 to 65%.  The left ventricle has normal function. The left ventricle has no regional wall motion abnormalities. There is mild left ventricular hypertrophy.  2. Right ventricular systolic function reduced. The right ventricular size is grossly normal. There is normal pulmonary artery systolic pressure.  3. Left atrial size was mildly dilated.  4. Right atrial size was moderately dilated.  5. The mitral valve is normal in structure. Mild mitral valve regurgitation. No evidence of mitral stenosis.  6. The aortic valve was not well visualized. Aortic valve regurgitation is trivial. No aortic stenosis is present.  7. Aortic dilatation noted. There is dilatation of the ascending aorta, measuring 39 mm.  8. The inferior vena cava is dilated in size with >50% respiratory variability, suggesting right atrial pressure of 8 mmHg. Comparison(s): No prior Echocardiogram. FINDINGS  Left Ventricle: Left ventricular ejection fraction, by estimation, is 60 to 65%. The left ventricle has normal function. The left ventricle has no regional wall motion abnormalities. Definity contrast agent was given IV to delineate the left ventricular  endocardial borders. The left ventricular internal cavity size was normal in size. There is mild left ventricular hypertrophy. Left ventricular diastolic function could not be evaluated due to atrial fibrillation. Right Ventricle: The right ventricular size is grossly normal. Right vetricular wall thickness was not well visualized. Right ventricular systolic function reduced. There is normal pulmonary artery systolic pressure. The tricuspid regurgitant velocity is  2.52 m/s, and with an assumed right atrial pressure of 8 mmHg, the estimated right ventricular systolic pressure is 33.4 mmHg. Left Atrium: Left atrial size was mildly dilated. Right Atrium: Right atrial size was moderately dilated. Pericardium: There is no evidence of pericardial effusion. Mitral Valve: The mitral valve is normal in structure. Mild  mitral valve regurgitation. No evidence of mitral valve stenosis. Tricuspid Valve: The tricuspid valve is normal in structure. Tricuspid valve regurgitation is mild . No evidence of tricuspid stenosis. Aortic Valve: The aortic valve was not well visualized. Aortic valve regurgitation is trivial. No aortic stenosis is present. Aortic valve mean gradient measures 2.0 mmHg. Aortic valve peak gradient measures 4.1 mmHg. Aortic valve area, by VTI measures 3.21 cm. Pulmonic Valve: The pulmonic valve was not well visualized. Pulmonic valve regurgitation is not visualized. No evidence of pulmonic stenosis. Aorta: The aortic root  is normal in size and structure and aortic dilatation noted. There is dilatation of the ascending aorta, measuring 39 mm. Venous: The inferior vena cava is dilated in size with greater than 50% respiratory variability, suggesting right atrial pressure of 8 mmHg. IAS/Shunts: The atrial septum is grossly normal.  LEFT VENTRICLE PLAX 2D LVIDd:         5.00 cm   Diastology LVIDs:         2.90 cm   LV e' medial:    13.10 cm/s LV PW:         1.30 cm   LV E/e' medial:  6.1 LV IVS:        1.30 cm   LV e' lateral:   13.20 cm/s LVOT diam:     2.30 cm   LV E/e' lateral: 6.1 LV SV:         55 LV SV Index:   21 LVOT Area:     4.15 cm  RIGHT VENTRICLE         IVC TAPSE (M-mode): 1.3 cm  IVC diam: 2.40 cm LEFT ATRIUM              Index        RIGHT ATRIUM           Index LA diam:        4.90 cm  1.91 cm/m   RA Area:     28.80 cm LA Vol (A2C):   121.0 ml 47.29 ml/m  RA Volume:   97.30 ml  38.02 ml/m LA Vol (A4C):   83.6 ml  32.67 ml/m LA Biplane Vol: 101.0 ml 39.47 ml/m  AORTIC VALVE                    PULMONIC VALVE AV Area (Vmax):    3.31 cm     PV Vmax:       0.76 m/s AV Area (Vmean):   3.07 cm     PV Peak grad:  2.3 mmHg AV Area (VTI):     3.21 cm AV Vmax:           100.82 cm/s AV Vmean:          69.600 cm/s AV VTI:            0.170 m AV Peak Grad:      4.1 mmHg AV Mean Grad:      2.0 mmHg LVOT Vmax:          80.22 cm/s LVOT Vmean:        51.380 cm/s LVOT VTI:          0.132 m LVOT/AV VTI ratio: 0.77  AORTA Ao Root diam: 3.70 cm Ao Asc diam:  3.90 cm MITRAL VALVE               TRICUSPID VALVE MV Area (PHT): 5.50 cm    TR Peak grad:   25.4 mmHg MV Decel Time: 138 msec    TR Vmax:        252.00 cm/s MV E velocity: 80.20 cm/s                            SHUNTS                            Systemic VTI:  0.13 m  Systemic Diam: 2.30 cm Sunit Tolia Electronically signed by Tessa Lerner Signature Date/Time: 02/25/2023/3:20:05 PM    Final    MR LUMBAR SPINE WO CONTRAST  Result Date: 02/25/2023 CLINICAL DATA:  Low back pain, symptoms persist with > 6 wks treatment. EXAM: MRI LUMBAR SPINE WITHOUT CONTRAST TECHNIQUE: Multiplanar, multisequence MR imaging of the lumbar spine was performed. No intravenous contrast was administered. COMPARISON:  Lumbar spine radiographs 02/24/2023. FINDINGS: Segmentation: Conventional numbering is assumed with 5 non-rib-bearing, lumbar type vertebral bodies. Alignment:  Normal. Vertebrae: Inferior endplate compression fracture of the L1 vertebral body with moderate anterior height loss, associated marrow edema and 5 mm retropulsion of the posterior cortex. Irregular T1 hypointense and T2 hyperintense lesions in the L3 and L4 vertebral bodies (sagittal images 9 series 4 and 3). Modic type 1 degenerative endplate marrow signal changes at L4-5. Conus medullaris and cauda equina: Conus extends to the L1 level. Conus and cauda equina appear normal. Paraspinal and other soft tissues: Mild fatty atrophy of the paraspinal muscles. Disc levels: T12-L1:  Small right central disc protrusion. L1-L2:  Mild bilateral facet arthropathy. L2-L3: Small disc bulge and mild bilateral facet arthropathy. No spinal canal stenosis or neural foraminal narrowing. L3-L4: Small disc bulge and mild bilateral facet arthropathy. No spinal canal stenosis or neural foraminal narrowing. L4-L5: Small  disc bulge and mild bilateral facet arthropathy. No spinal canal stenosis or neural foraminal narrowing. L5-S1:  Mild bilateral facet arthropathy. IMPRESSION: 1. Acute/subacute inferior endplate compression fracture of the L1 vertebral body with moderate anterior height loss and 5 mm retropulsion of the posterior cortex. 2. Irregular T1 hypointense and T2 hyperintense lesions in the L3 and L4 vertebral bodies, concerning for possible metastatic disease. Recommend further evaluation with contrast-enhanced lumbar spine MRI. 3. Mild multilevel lumbar spondylosis without spinal canal stenosis or neural foraminal narrowing. Electronically Signed   By: Orvan Falconer M.D.   On: 02/25/2023 09:57   DG Lumbar Spine 2-3 Views  Result Date: 02/24/2023 CLINICAL DATA:  620110 Intractable back pain 620110. EXAM: LUMBAR SPINE - 2-3 VIEW COMPARISON:  12/02/2020. FINDINGS: There are 5 nonrib-bearing lumbar vertebrae. There is loss of lumbar lordosis, which may be on the basis of positioning or due to muscle spasm. There is focal kyphosis at L1 level due to moderate anterior wedging deformity of L1 vertebrae, which is new since the prior study. No significant retropulsion or spinal canal compromise. No spondylolisthesis. Moderate anterior wedging deformity of L1 vertebrae. Remaining vertebral body heights are maintained. No aggressive osseous lesion. Mild multilevel degenerative changes in the form of facet arthropathy and marginal osteophyte formation. Sacroiliac joints are symmetric. Visualized soft tissues are within normal limits. IMPRESSION: *Age indeterminate moderate anterior wedging deformity of L1 vertebra, new since the prior study from 2022. No significant retropulsion or spinal canal compromise. Correlate clinically to determine the need for additional imaging with MRI lumbar spine. Electronically Signed   By: Jules Schick M.D.   On: 02/24/2023 11:46   DG HIP UNILAT WITH PELVIS 1V RIGHT  Result Date:  02/24/2023 CLINICAL DATA:  Lower back pain radiating to right hip EXAM: DG HIP (WITH OR WITHOUT PELVIS) 1V RIGHT COMPARISON:  12/02/2020 FINDINGS: Frontal view of the pelvis and frogleg lateral view of the right hip are obtained. On the frontal view of the pelvis the bilateral greater trochanters are excluded by collimation. There are no acute displaced fractures identified on this examination limited by technique and patient body habitus. There is severe bilateral hip osteoarthritis, with severe joint space  narrowing, marginal osteophyte formation, and bony remodeling of the femoral head and acetabuli bilaterally. Progressive changes are seen on the right since prior study. Sacroiliac joints are normal. IMPRESSION: 1. Study limited by technique and body habitus. 2. Severe bilateral hip osteoarthritis, which has progressed on the right since prior study. No prior imaging of the left hip. 3. No evidence of displaced fracture. Electronically Signed   By: Sharlet Salina M.D.   On: 02/24/2023 11:45   DG Chest Portable 1 View  Result Date: 02/24/2023 CLINICAL DATA:  Heart palpitations. EXAM: PORTABLE CHEST 1 VIEW COMPARISON:  PA chest 01/04/2021 FINDINGS: There is moderate to severe cardiomegaly increased from 01/04/2009. Central vessels are normal caliber. There is no overt edema. The lungs are clear. There is no substantial pleural effusion. The mediastinum is stable. Thoracic cage is intact. IMPRESSION: Moderate to severe cardiomegaly increased from 01/04/2009. No overt edema or pleural effusion. Normal caliber vascular markings. Electronically Signed   By: Almira Bar M.D.   On: 02/24/2023 06:02     Assessment and Plan:   1. Back pain, presenting complaint - MRI with compression fracture and concern for possible metastatic disease - f/u MRI with contrast result pending  2. Persistent atrial fibrillation with RVR - diagnosed 2021, patient declined further workup or management at that time, unclear if  she has remained out of rhythm - HR was 70s-90s during ED visit earlier this month, here 120s on arrival - long discussion with patient about anticoagulation, medical management, stroke risk, and rationale for treatment of atrial fibrillation. We discussed risk of incapacitation from worsening cardiomyopathy, heart failure, neurologic events - she is pretty clear that she wishes to avoid anticoagulation at all costs and understands the risk of forgoing. I have asked her to give it some thought and let us know what she decides when MD evaluates - she is open to the idea of medication changes to address her HR - she does not wish to pursue DCCV whatsoever, so anticipate rate control strategy - suggest outpatient sleep study if she is willing (husband says only snores in deep sleep) - will discuss addition of oral dilt to regimen with MD  3. Lower extremity edema, dyspnea, mild RV dysfunction - suspect multifactorial from obesity, persistent atrial fib, cannot exclude contribution from undiagnosed OSA and component of acute diastolic HF though habitus makes it challenging to definitively say (BNP can be falsely low in this BMI) - patient reports edema actually better today since being in the bed - follow with rate control as above  4. HTN  - home amlodipine on hold  - also on losartan, Jardiance - otherwise management as above with rate control agents  5. Hypomagnesemia/hypokalemia - lytes per primary team - recommend keep K 4.0 or greater and Mg 2.0 or greater  6. DM - per IM  Risk Assessment/Risk Scores:        New York Heart Association (NYHA) Functional Class NYHA Class II  CHA2DS2-VASc Score = 5   This indicates a 7.2% annual risk of stroke. The patient's score is based upon: CHF History: 1 (RV dysfunction on echocardiogram) HTN History: 1 Diabetes History: 1 Stroke History: 0 Vascular Disease History: 1 (celiac dissection) Age Score: 0 Gender Score: 1         For  questions or updates, please contact [facility] Please consult [email or web address] for contact info under    Signed, Laurann Montana, PA-C  02/26/2023 3:42 PM  Patient seen, examined. Available data reviewed.  Agree with findings, assessment, and plan as outlined by Ronie Spies, PA-C.  The patient is independently interviewed and examined.  Her husband is at the bedside.  She is an alert, oriented woman in no distress.  HEENT is normal, JVP is normal, carotid upstrokes normal without bruits, lungs are clear bilaterally, heart is irregularly irregular with no murmur gallop, abdomen soft and nontender, there are no abdominal masses.  Extremities have trace pretibial edema bilaterally.  The patient's EKG demonstrates atrial fibrillation with RVR with a ventricular rate of 122 bpm.  Her echocardiogram demonstrates an LVEF of 60 to 65% with no regional wall motion abnormalities, mildly dilated left atrium, and moderately dilated right atrium.  There is only mild mitral regurgitation present.  The patient has atrial fibrillation of unknown duration.  She has not had any cardiology follow-up since 2021 and there are no EKGs in our system in the interim.  He 2021 she had atrial fibrillation that was not treated.  Now she presents with an unrelated problem with low back pain and compression fracture with concern for potential metastatic disease.  She is again noted to be in atrial fibrillation with elevated heart rate now.  She appears to be completely asymptomatic.  She has not been anticoagulated.  I think a rate control strategy at this point is appropriate.  Will continue metoprolol, but the patient prefers to be back on the succinate preparation.  She was taking 50 mg once daily and I will change her to 50 mg twice daily.  Will add diltiazem 120 mg daily.  We discussed anticoagulation and while she has been reluctant to take it in the past, after a lengthy discussion and understanding that her stroke risk is  elevated with a CHA2DS2-VASc score of 5, she is willing to reconsider this.  Her biggest concern is actually cost.  She states that she was on anticoagulation for period of time but the cost was approximately $140 per month and she and her husband are not able to afford this.  Will investigate anticoagulation options and asked the social worker to help see if she might qualify for assistance.  She is not a good candidate for warfarin.  Otherwise as outlined above.  Our team will follow with you.  Tonny Bollman, M.D. 02/26/2023 5:37 PM

## 2023-02-26 NOTE — Progress Notes (Signed)
Physical Therapy Treatment Patient Details Name: Tracie [name] MRN: [id number] DOB: 13-Aug-1960 Today's Date: 02/26/2023   History of Present Illness The pt is a 61 yo female presenting 11/17 for back spasms with pain down her R leg since 11/3. Pt found to be in afib with RVR and x-ray showed age-indeterminate L1 compression fx. PMH includes: HTN, HLD, DM II, afib, DVT, myalgias, and morbid obesity (BMI >40).    PT Comments  Pt continues to be limited by severe back pain. Was able to get up to bathroom and back with assist but yelling out with pain. Patient will benefit from continued inpatient follow up therapy, <3 hours/day unless pain able to be controlled to manage at home.    If plan is discharge home, recommend the following: A lot of help with walking and/or transfers;A lot of help with bathing/dressing/bathroom;Assistance with cooking/housework;Direct supervision/assist for medications management;Direct supervision/assist for financial management;Assist for transportation;Help with stairs or ramp for entrance   Can travel by private vehicle     No  Equipment Recommendations  Other (comment) (bariatric bedside commode, bariatric wheelchair)    Recommendations for Other Services       Precautions / Restrictions Precautions Precautions: Fall Restrictions Weight Bearing Restrictions: No     Mobility  Bed Mobility Overal bed mobility: Needs Assistance Bed Mobility: Supine to Sit, Sit to Supine     Supine to sit: Mod assist, HOB elevated Sit to supine: Contact guard assist, HOB elevated, Used rails   General bed mobility comments: Attempted cues to have pt log roll and come sidelying to sit but pt coming straight from supine to sit with assist to elevate trunk. Returning to supine pt with HOB elevated.    Transfers Overall transfer level: Needs assistance Equipment used: Rolling walker (2 wheels), Rollator (4 wheels), 2 person hand held assist Transfers: Sit  to/from Stand Sit to Stand: Min assist, +2 safety/equipment           General transfer comment: Assist with pt pulling up on husbands hand from bed. From toilet pt using grab bar and hand held    Ambulation/Gait Ambulation/Gait assistance: Contact guard assist Gait Distance (Feet): 12 Feet (x 2) Assistive device: Rollator (4 wheels), Rolling walker (2 wheels) Gait Pattern/deviations: Step-through pattern, Decreased stride length, Trunk flexed, Wide base of support Gait velocity: decr Gait velocity interpretation: <1.31 ft/sec, indicative of household ambulator   General Gait Details: Assist for safety. Pt used RW amb to bathroom and used bariatric rollator on way back with better performance with rollator which is what she uses at home   Stairs             Wheelchair Mobility     Tilt Bed    Modified Rankin (Stroke Patients Only)       Balance Overall balance assessment: Needs assistance Sitting-balance support: No upper extremity supported, Feet supported Sitting balance-Leahy Scale: Fair     Standing balance support: Bilateral upper extremity supported Standing balance-Leahy Scale: Poor Standing balance comment: UE support and CGA for static standing                            Cognition Arousal: Alert Behavior During Therapy: Anxious Overall Cognitive Status: Within Functional Limits for tasks assessed                                 General Comments: Anxious  about moving due to pain        Exercises      General Comments General comments (skin integrity, edema, etc.): HR to 140-150 with activity      Pertinent Vitals/Pain Pain Assessment Pain Assessment: 0-10 Pain Score: 10-Worst pain ever Pain Location: back Pain Descriptors / Indicators: Spasm Pain Intervention(s): Premedicated before session    Home Living                          Prior Function            PT Goals (current goals can now be  found in the care plan section) Acute Rehab PT Goals Patient Stated Goal: reduce pain so that she can walk at home Progress towards PT goals: Progressing toward goals    Frequency    Min 1X/week      PT Plan      Co-evaluation              AM-PAC PT "6 Clicks" Mobility   Outcome Measure  Help needed turning from your back to your side while in a flat bed without using bedrails?: A Little Help needed moving from lying on your back to sitting on the side of a flat bed without using bedrails?: A Little Help needed moving to and from a bed to a chair (including a wheelchair)?: Total Help needed standing up from a chair using your arms (e.g., wheelchair or bedside chair)?: Total Help needed to walk in hospital room?: Total Help needed climbing 3-5 steps with a railing? : Total 6 Click Score: 10    End of Session   Activity Tolerance: Patient limited by pain Patient left: in bed;with call bell/phone within reach;with family/visitor present Nurse Communication: Mobility status;Patient requests pain meds PT Visit Diagnosis: Pain;Other abnormalities of gait and mobility (R26.89);Muscle weakness (generalized) (M62.81) Pain - part of body:  (back)     Time: 5409-8119 PT Time Calculation (min) (ACUTE ONLY): 25 min  Charges:    $Gait Training: 23-37 mins PT General Charges $$ ACUTE PT VISIT: 1 Visit                     [facility] PT Acute Rehabilitation Services Office [phone]    Angelina Ok [facility] 02/26/2023, 1:05 PM

## 2023-02-26 NOTE — Progress Notes (Signed)
   02/26/23 1400  Spiritual Encounters  Type of Visit Initial  Care provided to: Patient;Family  Conversation partners present during encounter Nurse  Referral source Patient request  Reason for visit Advance directives  OnCall Visit No   Ch responded to request for AD. Patient's spouse was present at bedside. Chaplain assisted patient with AD education. Patient will page chaplain tomorrow to complete AD. Chaplain is available when needed.

## 2023-02-27 ENCOUNTER — Telehealth (HOSPITAL_COMMUNITY): Payer: Self-pay | Admitting: Pharmacy Technician

## 2023-02-27 ENCOUNTER — Other Ambulatory Visit (HOSPITAL_COMMUNITY): Payer: Self-pay

## 2023-02-27 DIAGNOSIS — D72825 Bandemia: Secondary | ICD-10-CM | POA: Diagnosis not present

## 2023-02-27 DIAGNOSIS — E876 Hypokalemia: Secondary | ICD-10-CM | POA: Diagnosis not present

## 2023-02-27 DIAGNOSIS — M549 Dorsalgia, unspecified: Secondary | ICD-10-CM | POA: Diagnosis not present

## 2023-02-27 DIAGNOSIS — I4891 Unspecified atrial fibrillation: Secondary | ICD-10-CM | POA: Diagnosis not present

## 2023-02-27 LAB — CBC
HCT: 44.8 % (ref 36.0–46.0)
Hemoglobin: 14 g/dL (ref 12.0–15.0)
MCH: 27.6 pg (ref 26.0–34.0)
MCHC: 31.3 g/dL (ref 30.0–36.0)
MCV: 88.4 fL (ref 80.0–100.0)
Platelets: 256 10*3/uL (ref 150–400)
RBC: 5.07 MIL/uL (ref 3.87–5.11)
RDW: 14 % (ref 11.5–15.5)
WBC: 12 10*3/uL — ABNORMAL HIGH (ref 4.0–10.5)
nRBC: 0 % (ref 0.0–0.2)

## 2023-02-27 LAB — BASIC METABOLIC PANEL
Anion gap: 8 (ref 5–15)
BUN: 10 mg/dL (ref 8–23)
CO2: 26 mmol/L (ref 22–32)
Calcium: 8.6 mg/dL — ABNORMAL LOW (ref 8.9–10.3)
Chloride: 102 mmol/L (ref 98–111)
Creatinine, Ser: 0.73 mg/dL (ref 0.44–1.00)
GFR, Estimated: >60 mL/min (ref >60)
Glucose, Bld: 111 mg/dL — ABNORMAL HIGH (ref 70–99)
Potassium: 3.6 mmol/L (ref 3.5–5.1)
Sodium: 136 mmol/L (ref 135–145)

## 2023-02-27 LAB — GLUCOSE, CAPILLARY
Glucose-Capillary: 137 mg/dL — ABNORMAL HIGH (ref 70–99)
Glucose-Capillary: 115 mg/dL — ABNORMAL HIGH (ref 70–99)
Glucose-Capillary: 134 mg/dL — ABNORMAL HIGH (ref 70–99)
Glucose-Capillary: 116 mg/dL — ABNORMAL HIGH (ref 70–99)

## 2023-02-27 LAB — MAGNESIUM: Magnesium: 1.5 mg/dL — ABNORMAL LOW (ref 1.7–2.4)

## 2023-02-27 MED ORDER — MAGNESIUM OXIDE -MG SUPPLEMENT 400 (240 MG) MG PO TABS
400.0000 mg | ORAL_TABLET | Freq: Two times a day (BID) | ORAL | Status: AC
  Administered 2023-02-27 (×2): 400 mg via ORAL
  Filled 2023-02-27 (×2): qty 1

## 2023-02-27 MED ORDER — MAGNESIUM SULFATE 4 GM/100ML IV SOLN
4.0000 g | Freq: Once | INTRAVENOUS | Status: AC
  Administered 2023-02-27: 4 g via INTRAVENOUS
  Filled 2023-02-27: qty 100

## 2023-02-27 MED ORDER — MAGNESIUM SULFATE 4 GM/100ML IV SOLN: 4.0000 g | Freq: Once | INTRAVENOUS | Status: DC

## 2023-02-27 MED ORDER — HYDROMORPHONE HCL 1 MG/ML IJ SOLN
1.0000 mg | INTRAMUSCULAR | Status: DC | PRN
  Administered 2023-02-27 – 2023-03-04 (×16): 1 mg via INTRAVENOUS
  Filled 2023-02-27 (×17): qty 1

## 2023-02-27 MED ORDER — DILTIAZEM HCL ER 60 MG PO CP12
60.0000 mg | ORAL_CAPSULE | Freq: Once | ORAL | Status: AC
Start: 2023-02-27 — End: 2023-02-27
  Administered 2023-02-27: 60 mg via ORAL
  Filled 2023-02-27: qty 1

## 2023-02-27 MED ORDER — POTASSIUM CHLORIDE CRYS ER 20 MEQ PO TBCR
40.0000 meq | EXTENDED_RELEASE_TABLET | Freq: Once | ORAL | Status: AC
  Administered 2023-02-27: 40 meq via ORAL
  Filled 2023-02-27: qty 2

## 2023-02-27 MED ORDER — DILTIAZEM HCL ER COATED BEADS 180 MG PO CP24: 180.0000 mg | ORAL_CAPSULE | Freq: Every day | ORAL | Status: DC

## 2023-02-27 MED ORDER — POTASSIUM CHLORIDE CRYS ER 20 MEQ PO TBCR: 40.0000 meq | EXTENDED_RELEASE_TABLET | Freq: Two times a day (BID) | ORAL | Status: DC

## 2023-02-27 NOTE — Progress Notes (Signed)
Occupational Therapy Treatment Patient Details Name: Tracie [name] MRN: [id number] DOB: [birth year] Today's Date: 02/27/2023   History of present illness The pt is a 62 yo female presenting 11/17 for back spasms with pain down her R leg since 11/3. Pt found to be in afib with RVR and x-ray showed age-indeterminate L1 compression fx and possible metestatic dx in spine which is in process of being worked up. PMH includes: HTN, HLD, DM II, afib, DVT, myalgias, and morbid obesity (BMI >40).   OT comments  Pt making slow but steady progress toward all goals. Pt agreeable to OOB and mobilizing short distances with min assist and rollator. Pt in Afib with HR up to 174 during session. Pt unable to tolerate much activity due to heart but only requiring min assist to mobilize and mod assist with adls. Pt educated on ways to protect her back with back precautions but is not implementing them at this time. Will continue to see with focus on mobility and back safety.      If plan is discharge home, recommend the following:  A little help with walking and/or transfers;A little help with bathing/dressing/bathroom;Assistance with cooking/housework;Assist for transportation;Help with stairs or ramp for entrance   Equipment Recommendations       Recommendations for Other Services      Precautions / Restrictions Precautions Precautions: Fall;Other (comment) Precaution Comments: Pt in Afib. HR up to 174 Restrictions Weight Bearing Restrictions: No       Mobility Bed Mobility Overal bed mobility: Needs Assistance Bed Mobility: Supine to Sit, Sit to Supine     Supine to sit: Min assist, HOB elevated Sit to supine: Supervision, HOB elevated   General bed mobility comments: Pt not cognizant of back precautions despite being educated on better ways to move to protect back.    Transfers Overall transfer level: Needs assistance Equipment used: Rollator (4 wheels) Transfers: Sit to/from Stand,  Bed to chair/wheelchair/BSC Sit to Stand: Min assist     Step pivot transfers: Contact guard assist     General transfer comment: Pt pulls up on rollator and on grab bars in bathroom to get to full stand. This is safe as long as the object pulling from  stays still.     Balance Overall balance assessment: Needs assistance Sitting-balance support: No upper extremity supported, Feet supported Sitting balance-Leahy Scale: Good     Standing balance support: Bilateral upper extremity supported, Reliant on assistive device for balance Standing balance-Leahy Scale: Poor Standing balance comment: UE support and CGA for static standing                           ADL either performed or assessed with clinical judgement   ADL Overall ADL's : Needs assistance/impaired Eating/Feeding: Independent;Sitting   Grooming: Wash/dry hands;Wash/dry face;Oral care;Sitting;Modified independent   Upper Body Bathing: Set up;Sitting   Lower Body Bathing: Moderate assistance;Sit to/from stand;Cueing for compensatory techniques   Upper Body Dressing : Set up;Sitting   Lower Body Dressing: Maximal assistance;Sit to/from stand;Cueing for compensatory techniques Lower Body Dressing Details (indicate cue type and reason): cues for back precautions Toilet Transfer: Minimal assistance;Ambulation;Comfort height toilet;Rollator (4 wheels);Grab bars Toilet Transfer Details (indicate cue type and reason): Pt heavily reliant on grab bars to pull self onto feet post toileting. Toileting- Clothing Manipulation and Hygiene: Supervision/safety;Sitting/lateral lean;Cueing for compensatory techniques;Cueing for back precautions       Functional mobility during ADLs: Contact guard assist;Rollator (4 wheels) General ADL  Comments: Pt most limited by afib with HR up to 174 and SOB. Pt walked in room but fatigues quickly.    Extremity/Trunk Assessment Upper Extremity Assessment Upper Extremity Assessment:  Overall WFL for tasks assessed   Lower Extremity Assessment Lower Extremity Assessment: Defer to PT evaluation        Vision   Vision Assessment?: No apparent visual deficits   Perception Perception Perception: Within Functional Limits   Praxis Praxis Praxis: WFL    Cognition Arousal: Alert Behavior During Therapy: Anxious Overall Cognitive Status: Within Functional Limits for tasks assessed                                 General Comments: Anxious about moving due to pain        Exercises      Shoulder Instructions       General Comments Pt progressing with mobilty compared to earlier in week when pt refusing to get up. Pt walked to bathroom and toileted with mininal assist.  Pt continues to complain of pain with all movement but not responsive to different ways of moving to protect back.    Pertinent Vitals/ Pain       Pain Assessment Pain Assessment: Faces Faces Pain Scale: Hurts little more Pain Location: back Pain Descriptors / Indicators: Spasm Pain Intervention(s): Limited activity within patient's tolerance, Monitored during session, Premedicated before session, Repositioned  Home Living                                          Prior Functioning/Environment              Frequency  Min 1X/week        Progress Toward Goals  OT Goals(current goals can now be found in the care plan section)  Progress towards OT goals: Progressing toward goals  Acute Rehab OT Goals Patient Stated Goal: to be able to go home in less pain OT Goal Formulation: With patient Time For Goal Achievement: 03/11/23 Potential to Achieve Goals: Good ADL Goals Pt Will Perform Grooming: with contact guard assist;standing Pt Will Perform Lower Body Bathing: with min assist;with adaptive equipment;sit to/from stand Pt Will Perform Lower Body Dressing: with min assist;with adaptive equipment;sit to/from stand Pt Will Transfer to Toilet: with  contact guard assist;bedside commode Pt Will Perform Toileting - Clothing Manipulation and hygiene: with contact guard assist;sit to/from stand Additional ADL Goal #1: Pt will toletate sitting EOB for 5 minutes with supervision in prep for adls EOB  Plan      Co-evaluation                 AM-PAC OT "6 Clicks" Daily Activity     Outcome Measure   Help from another person eating meals?: None Help from another person taking care of personal grooming?: None Help from another person toileting, which includes using toliet, bedpan, or urinal?: A Little Help from another person bathing (including washing, rinsing, drying)?: A Lot Help from another person to put on and taking off regular upper body clothing?: A Little Help from another person to put on and taking off regular lower body clothing?: A Lot 6 Click Score: 18    End of Session Equipment Utilized During Treatment: Rollator (4 wheels)  OT Visit Diagnosis: Unsteadiness on feet (R26.81);Pain Pain - Right/Left: Left Pain -  part of body: Leg   Activity Tolerance Patient limited by pain   Patient Left in bed;with call bell/phone within reach   Nurse Communication Mobility status        Time: 1914-7829 OT Time Calculation (min): 14 min  Charges: OT General Charges $OT Visit: 1 Visit OT Treatments $Self Care/Home Management : 8-22 mins    Hope Budds 02/27/2023, 9:06 AM

## 2023-02-27 NOTE — Progress Notes (Signed)
[facility] PROGRESS NOTE    Progress Note  Tracie [name]  ZOX:[id number] DOB: 12-May-1960 DOA: 02/24/2023 PCP: Aliene Beams, MD     Brief Narrative:   Tracie [name] is an 62 y.o. female past medical history of hypertension diabetes mellitus type 2, chronic atrial fibrillation and history of DVT on apixaban, Tracie [name] seen in the ED in 02/21/2023 for back pain diagnosed with sciatica sent home with medications, saw her PCP but was given muscle relaxer and is steroid taper comes to the ED complaining of back pain was found to be in A-fib with RVR.  She says she was getting better but the pain came back worst no neurological symptoms.  Assessment/Plan:   Atrial fibrillation with RVR (HCC): Now on diltiazem and metoprolol for rate control. Chads Vascor greater than 4. 2D echo was done that showed an EF of 60% no wall motion abnormality. Cardiology was consulted due to severity rate control strategy and a sleep study as an outpatient. Try to keep potassium greater than 4 magnesium greater than 2. Use IV metoprolol as needed. She is not a good candidate for warfarin.  TOC consulted for anticoagulation.  Lumbar pain/L1 compression fracture/osteoporosis: MRI of the lumbar spine showed acute/subacute inferior endplate compression fracture of L1 with moderate height loss and 5 mm retropulsion of the posterior cortex.  Also there was a irregular T1-T2 L3 and L4 vertebral body lesion concerning for possible metastatic disease.   Repeat MRI with contrast showed body lesions are not enhancing or likely to be benign. She has required multiple ongoing doses of IV morphine and Flexeril. She continues to have significant pain on morphine and Flexeril. Will go ahead and change her to hydromorphone. Consult IR for possible vertebroplasty.  Osteoporosis: Follow-up PCP as an outpatient will need to be on calcium, vitamin D and biphosphonate's  Hypokalemia/hypomagnesemia: Replete  recheck magnesium. Check a potassium greater than 4 magnesium greater than 2.  Leukocytosis: Likely due to steroids she has remained afebrile. On steroids for 2 more days.  Controlled diabetes mellitus type 2: Resumed statin Jardiance. Continue long-acting insulin plus sliding scale CBGs ACH S, A1c of 7.0. Blood glucose well-controlled.  Essential hypertension: Currently on Cardizem, losartan and metoprolol for blood pressure still elevated.  Hyperlipidemia: Continue statins.  Lower extremity edema/mild RV dysfunction: Suspect multifactorial in the setting of obesity A-fib, probably undiagnosed such as sleep apnea. Cardiology recommended sleep study as an outpatient.    DVT prophylaxis: Eliquis Family Communication:none Status is: Observation The patient remains OBS appropriate and will d/c before 2 midnights.    Code Status:     Code Status Orders  (From admission, onward)           Start     Ordered   02/24/23 0737  Full code  Continuous       Question:  By:  Answer:  Consent: discussion documented in EHR   02/24/23 0739           Code Status History     This patient has a current code status but no historical code status.         IV Access:   Peripheral IV   Procedures and diagnostic studies:   MR LUMBAR SPINE W CONTRAST  Result Date: 02/26/2023 CLINICAL DATA:  Follow-up L3 and L4 vertebral body lesions seen on yesterday's lumbar spine MRI. Acute L1 compression fracture. EXAM: MRI LUMBAR SPINE WITH CONTRAST TECHNIQUE: Multiplanar and multiecho pulse sequences of the lumbar spine were obtained with intravenous  contrast. CONTRAST:  10mL GADAVIST GADOBUTROL 1 MMOL/ML IV SOLN COMPARISON:  MRI lumbar spine from yesterday. Lumbar spine x-rays dated February 24, 2023. FINDINGS: Segmentation:  Standard. Alignment:  Physiologic. Vertebrae: Unchanged acute to subacute L1 inferior endplate compression fracture with 5 mm retropulsion of the posteroinferior  cortex. L3 and L4 vertebral body lesion seen on yesterday's MRI do not enhance. No suspicious bone lesion. Mild degenerative endplate enhancement L4-L5. Conus medullaris and cauda equina: Conus extends to the L1 level. Conus and cauda equina appear normal. No abnormal intrathecal enhancement. Paraspinal and other soft tissues: 3.7 cm simple cyst in the upper pole of the left kidney. No follow-up imaging is recommended. Otherwise negative. Disc levels: Similar mild multilevel degenerative changes as described on yesterday's MRI. No stenosis. IMPRESSION: 1. L3 and L4 vertebral body lesions seen on yesterday's MRI do not enhance and are likely benign. Consider follow-up MRI in 3-6 months to evaluate for interval change. 2. Unchanged L1 inferior endplate compression fracture. Electronically Signed   By: Obie Dredge M.D.   On: 02/26/2023 16:14   ECHOCARDIOGRAM COMPLETE  Result Date: 02/25/2023    ECHOCARDIOGRAM REPORT   Patient Name:   Tracie [name] Date of Exam: 02/25/2023 Medical Rec #:  [id number]           Height:       70.5 in Accession #:    [id number]          Weight:       319.0 lb Date of Birth:  1960/10/08           BSA:          2.559 m Patient Age:    62 years            BP:           123/74 mmHg Patient Gender: F                   HR:           100 bpm. Exam Location:  Inpatient Procedure: 2D Echo, Cardiac Doppler, Color Doppler and Intracardiac            Opacification Agent Indications:    A-fib  History:        Patient has no prior history of Echocardiogram examinations.                 Arrythmias:Atrial Fibrillation; Risk Factors:Hypertension,                 Dyslipidemia, Diabetes and Former Smoker.  Sonographer:    Dondra Prader RVT RCS Referring Phys: [id number] [facility]  Sonographer Comments: Technically challenging study due to limited acoustic windows, Technically difficult study due to poor echo windows and suboptimal apical window. Image acquisition challenging due to patient body  habitus and Image acquisition challenging due to respiratory motion. Patient supine; limited and poor windows IMPRESSIONS  1. Left ventricular ejection fraction, by estimation, is 60 to 65%. The left ventricle has normal function. The left ventricle has no regional wall motion abnormalities. There is mild left ventricular hypertrophy.  2. Right ventricular systolic function reduced. The right ventricular size is grossly normal. There is normal pulmonary artery systolic pressure.  3. Left atrial size was mildly dilated.  4. Right atrial size was moderately dilated.  5. The mitral valve is normal in structure. Mild mitral valve regurgitation. No evidence of mitral stenosis.  6. The aortic valve was not well visualized. Aortic valve regurgitation is trivial.  No aortic stenosis is present.  7. Aortic dilatation noted. There is dilatation of the ascending aorta, measuring 39 mm.  8. The inferior vena cava is dilated in size with >50% respiratory variability, suggesting right atrial pressure of 8 mmHg. Comparison(s): No prior Echocardiogram. FINDINGS  Left Ventricle: Left ventricular ejection fraction, by estimation, is 60 to 65%. The left ventricle has normal function. The left ventricle has no regional wall motion abnormalities. Definity contrast agent was given IV to delineate the left ventricular  endocardial borders. The left ventricular internal cavity size was normal in size. There is mild left ventricular hypertrophy. Left ventricular diastolic function could not be evaluated due to atrial fibrillation. Right Ventricle: The right ventricular size is grossly normal. Right vetricular wall thickness was not well visualized. Right ventricular systolic function reduced. There is normal pulmonary artery systolic pressure. The tricuspid regurgitant velocity is  2.52 m/s, and with an assumed right atrial pressure of 8 mmHg, the estimated right ventricular systolic pressure is 33.4 mmHg. Left Atrium: Left atrial size was  mildly dilated. Right Atrium: Right atrial size was moderately dilated. Pericardium: There is no evidence of pericardial effusion. Mitral Valve: The mitral valve is normal in structure. Mild mitral valve regurgitation. No evidence of mitral valve stenosis. Tricuspid Valve: The tricuspid valve is normal in structure. Tricuspid valve regurgitation is mild . No evidence of tricuspid stenosis. Aortic Valve: The aortic valve was not well visualized. Aortic valve regurgitation is trivial. No aortic stenosis is present. Aortic valve mean gradient measures 2.0 mmHg. Aortic valve peak gradient measures 4.1 mmHg. Aortic valve area, by VTI measures 3.21 cm. Pulmonic Valve: The pulmonic valve was not well visualized. Pulmonic valve regurgitation is not visualized. No evidence of pulmonic stenosis. Aorta: The aortic root is normal in size and structure and aortic dilatation noted. There is dilatation of the ascending aorta, measuring 39 mm. Venous: The inferior vena cava is dilated in size with greater than 50% respiratory variability, suggesting right atrial pressure of 8 mmHg. IAS/Shunts: The atrial septum is grossly normal.  LEFT VENTRICLE PLAX 2D LVIDd:         5.00 cm   Diastology LVIDs:         2.90 cm   LV e' medial:    13.10 cm/s LV PW:         1.30 cm   LV E/e' medial:  6.1 LV IVS:        1.30 cm   LV e' lateral:   13.20 cm/s LVOT diam:     2.30 cm   LV E/e' lateral: 6.1 LV SV:         55 LV SV Index:   21 LVOT Area:     4.15 cm  RIGHT VENTRICLE         IVC TAPSE (M-mode): 1.3 cm  IVC diam: 2.40 cm LEFT ATRIUM              Index        RIGHT ATRIUM           Index LA diam:        4.90 cm  1.91 cm/m   RA Area:     28.80 cm LA Vol (A2C):   121.0 ml 47.29 ml/m  RA Volume:   97.30 ml  38.02 ml/m LA Vol (A4C):   83.6 ml  32.67 ml/m LA Biplane Vol: 101.0 ml 39.47 ml/m  AORTIC VALVE  PULMONIC VALVE AV Area (Vmax):    3.31 cm     PV Vmax:       0.76 m/s AV Area (Vmean):   3.07 cm     PV Peak grad:   2.3 mmHg AV Area (VTI):     3.21 cm AV Vmax:           100.82 cm/s AV Vmean:          69.600 cm/s AV VTI:            0.170 m AV Peak Grad:      4.1 mmHg AV Mean Grad:      2.0 mmHg LVOT Vmax:         80.22 cm/s LVOT Vmean:        51.380 cm/s LVOT VTI:          0.132 m LVOT/AV VTI ratio: 0.77  AORTA Ao Root diam: 3.70 cm Ao Asc diam:  3.90 cm MITRAL VALVE               TRICUSPID VALVE MV Area (PHT): 5.50 cm    TR Peak grad:   25.4 mmHg MV Decel Time: 138 msec    TR Vmax:        252.00 cm/s MV E velocity: 80.20 cm/s                            SHUNTS                            Systemic VTI:  0.13 m                            Systemic Diam: 2.30 cm Sunit Tolia Electronically signed by Tessa Lerner Signature Date/Time: 02/25/2023/3:20:05 PM    Final      Medical Consultants:   None.   Subjective:    Tracie [name] relates her pain is not controlled.  Objective:    Vitals:   02/26/23 2010 02/27/23 0328 02/27/23 0816 02/27/23 0817  BP: (!) 164/96 (!) 164/110 (!) 149/89 (!) 149/89  Pulse: (!) 110 99 (!) 118   Resp: 18 20    Temp: 97.9 F (36.6 C) 97.8 F (36.6 C)    TempSrc: Oral Oral    SpO2: 92% 94%    Weight:  (!) 139.2 kg    Height:       SpO2: 94 %  No intake or output data in the 24 hours ending 02/27/23 1025  Filed Weights   02/24/23 0442 02/26/23 0430 02/27/23 0328  Weight: (!) 144.7 kg (!) 143.6 kg (!) 139.2 kg    Exam: General exam: In no acute distress. Respiratory system: Good air movement and clear to auscultation. Cardiovascular system: S1 & S2 heard, RRR. No JVD. Gastrointestinal system: Abdomen is nondistended, soft and nontender.  Extremities: No pedal edema. Skin: No rashes, lesions or ulcers Psychiatry: Judgement and insight appear normal. Mood & affect appropriate. Data Reviewed:    Labs: Basic Metabolic Panel: Recent Labs  Lab 02/24/23 0548 02/24/23 0748 02/25/23 0112 02/26/23 0922 02/27/23 0603  NA 135  --  138 135 136  K 3.3*  --  3.2*  4.2 3.6  CL 104  --  106 102 102  CO2 22  --  25 23 26   GLUCOSE 186*  --  175* 121* 111*  BUN  13  --  12 12 10   CREATININE 0.69  --  0.70 0.68 0.73  CALCIUM 8.6*  --  8.7* 8.5* 8.6*  MG  --  1.6*  --  1.7 1.5*   GFR Estimated Creatinine Clearance: 109.3 mL/min (by C-G formula based on SCr of 0.73 mg/dL). Liver Function Tests: No results for input(s): "AST", "ALT", "ALKPHOS", "BILITOT", "PROT", "ALBUMIN" in the last 168 hours. No results for input(s): "LIPASE", "AMYLASE" in the last 168 hours. No results for input(s): "AMMONIA" in the last 168 hours. Coagulation profile No results for input(s): "INR", "PROTIME" in the last 168 hours. COVID-19 Labs  No results for input(s): "DDIMER", "FERRITIN", "LDH", "CRP" in the last 72 hours.  No results found for: "SARSCOV2NAA"  CBC: Recent Labs  Lab 02/24/23 0548 02/25/23 0112 02/26/23 0922 02/27/23 0603  WBC 14.2* 13.7* 11.6* 12.0*  NEUTROABS 11.6*  --   --   --   HGB 14.1 13.5 14.6 14.0  HCT 45.7 43.7 46.8* 44.8  MCV 89.4 90.1 89.7 88.4  PLT 316 291 287 256   Cardiac Enzymes: No results for input(s): "CKTOTAL", "CKMB", "CKMBINDEX", "TROPONINI" in the last 168 hours. BNP (last 3 results) No results for input(s): "PROBNP" in the last 8760 hours. CBG: Recent Labs  Lab 02/26/23 0747 02/26/23 1157 02/26/23 1549 02/26/23 2149 02/27/23 0747  GLUCAP 103* 113* 127* 111* 137*   D-Dimer: No results for input(s): "DDIMER" in the last 72 hours. Hgb A1c: No results for input(s): "HGBA1C" in the last 72 hours.  Lipid Profile: Recent Labs    02/25/23 0112  CHOL 143  HDL 47  LDLCALC 80  TRIG 79  CHOLHDL 3.0   Thyroid function studies: No results for input(s): "TSH", "T4TOTAL", "T3FREE", "THYROIDAB" in the last 72 hours.  Invalid input(s): "FREET3"  Anemia work up: No results for input(s): "VITAMINB12", "FOLATE", "FERRITIN", "TIBC", "IRON", "RETICCTPCT" in the last 72 hours. Sepsis Labs: Recent Labs  Lab 02/24/23 0548  02/25/23 0112 02/26/23 0922 02/27/23 0603  WBC 14.2* 13.7* 11.6* 12.0*   Microbiology No results found for this or any previous visit (from the past 240 hour(s)).   Medications:    apixaban  5 mg Oral BID   [START ON 02/28/2023] diltiazem  180 mg Oral Daily   empagliflozin  25 mg Oral Daily   insulin aspart  0-15 Units Subcutaneous TID WC   insulin aspart  0-5 Units Subcutaneous QHS   insulin aspart  4 Units Subcutaneous TID WC   insulin detemir  5 Units Subcutaneous QHS   lidocaine  1 patch Transdermal Daily   losartan  100 mg Oral Daily   metoprolol succinate  50 mg Oral BID   Continuous Infusions:  magnesium sulfate bolus IVPB 4 g (02/27/23 0937)      LOS: 1 day   Marinda Elk  [facility]  02/27/2023, 10:25 AM

## 2023-02-27 NOTE — Telephone Encounter (Signed)
Patient Product/process development scientist completed.    The patient is insured through [employer]/ [employer]. Patient has Medicare and is not eligible for a copay card, but may be able to apply for patient assistance, if available.    Ran test claim for Eliquis 5 mg and the current 30 day co-pay is $142.54 due to being in Coverage Gap (donut hole).  Ran test claim for Xarelto 20 mg and the current 30 day co-pay is $136.59 due to being in Coverage Gap (donut hole).  Ran test claim for dabigatran (Pradaxa) 150 mg and Not on Formulary   This test claim was processed through [facility]- copay amounts may vary at other pharmacies due to [employer], or as the patient moves through the different stages of their insurance plan.     Roland Earl, CPHT Pharmacy Technician III Certified Patient Advocate [facility] Patient Advocate Team Direct Number: [phone]  Fax: [phone]

## 2023-02-27 NOTE — Progress Notes (Signed)
Orthopedic Tech Progress Note Patient Details:  Tracie [name] 01/24/1961 [id number]  Ortho Devices Type of Ortho Device: Thoracolumbar corset (TLSO) Ortho Device/Splint Interventions: Ordered, Application, Adjustment   Post Interventions Patient Tolerated: Well Instructions Provided: Adjustment of device  Tonye Pearson 02/27/2023, 11:07 AM

## 2023-02-27 NOTE — Care Management (Signed)
Patient is eligible for 30 day free card for DOAC. CM will follow and provide 30 day manufacturer free card to patient if meds are not filled through [facility] pharmacy at [place].

## 2023-02-27 NOTE — Progress Notes (Signed)
Interventional Radiology Brief Note:  Patient admitted with back pain after bending/stooping 2.5 weeks ago.  She has tried several conservative interventions including muscle relaxers, pain medications, steroids.  She did present to the ED 02/21/23 and was discharged with pain medication, however pain persisted prompting her to seek re-evaluation.  MR Imaging shows L1 compression fracture.  IR now consulted for vertebroplasty/kyphoplasty in the setting of refractory pain. Case reviewed by Dr. Corliss Skains who approves L1 VP/KP.  Procedure prior authorization requested.   Loyce Dys, MS RD PA-C

## 2023-02-27 NOTE — Plan of Care (Signed)
  Problem: Education: Goal: Ability to describe self-care measures that may prevent or decrease complications (Diabetes Survival Skills Education) will improve Outcome: Progressing   Problem: Coping: Goal: Ability to adjust to condition or change in health will improve Outcome: Progressing   Problem: Fluid Volume: Goal: Ability to maintain a balanced intake and output will improve Outcome: Progressing   Problem: Health Behavior/Discharge Planning: Goal: Ability to manage health-related needs will improve Outcome: Progressing   Problem: Nutritional: Goal: Progress toward achieving an optimal weight will improve Outcome: Progressing   Problem: Skin Integrity: Goal: Risk for impaired skin integrity will decrease Outcome: Progressing   Problem: Education: Goal: Knowledge of General Education information will improve Description: Including pain rating scale, medication(s)/side effects and non-pharmacologic comfort measures Outcome: Progressing   Problem: Health Behavior/Discharge Planning: Goal: Ability to manage health-related needs will improve Outcome: Progressing   Problem: Clinical Measurements: Goal: Ability to maintain clinical measurements within normal limits will improve Outcome: Progressing Goal: Will remain free from infection Outcome: Progressing Goal: Diagnostic test results will improve Outcome: Progressing Goal: Cardiovascular complication will be avoided Outcome: Progressing   Problem: Activity: Goal: Risk for activity intolerance will decrease Outcome: Progressing   Problem: Elimination: Goal: Will not experience complications related to urinary retention Outcome: Progressing   Problem: Pain Management: Goal: General experience of comfort will improve Outcome: Progressing   Problem: Safety: Goal: Ability to remain free from injury will improve Outcome: Progressing   Problem: Skin Integrity: Goal: Risk for impaired skin integrity will  decrease Outcome: Progressing

## 2023-02-27 NOTE — Progress Notes (Addendum)
Patient Name: Tracie [name] Date of Encounter: 02/27/2023 [facility] Cardiologist: Parke Poisson, MD   Interval Summary  .    Still with ongoing back pain.   Vital Signs .    Vitals:   02/26/23 0430 02/26/23 0722 02/26/23 2010 02/27/23 0328  BP: (!) 144/99 (!) 115/96 (!) 164/96 (!) 164/110  Pulse: 91 71 (!) 110 99  Resp:   18 20  Temp: 97.8 F (36.6 C) 97.6 F (36.4 C) 97.9 F (36.6 C) 97.8 F (36.6 C)  TempSrc: Oral Oral Oral Oral  SpO2: 93%  92% 94%  Weight: (!) 143.6 kg   (!) 139.2 kg  Height:       No intake or output data in the 24 hours ending 02/27/23 0756    02/27/2023    3:28 AM 02/26/2023    4:30 AM 02/24/2023    4:42 AM  Last 3 Weights  Weight (lbs) 306 lb 12.8 oz 316 lb 9.3 oz 318 lb 15.7 oz  Weight (kg) 139.164 kg 143.6 kg 144.69 kg      Telemetry/ECG    Atrial fibrillation with RVR, 110-140s - Personally Reviewed  Physical Exam .   GEN: No acute distress.   Neck: No JVD Cardiac: Irreg Irreg/tachycardia, no murmurs, rubs, or gallops.  Respiratory: Clear to auscultation bilaterally. GI: Soft, nontender, non-distended  MS: 1+ bilateral LE edema  Assessment & Plan .     62 y.o. 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 David Stall.   Back pain, presenting complaint -- MRI with compression fracture and concern for possible metastatic disease, follow up MRI with contrast with benign lesions -- f/u MRI in 3-6 months   Persistent atrial fibrillation with RVR -- diagnosed 2021, patient declined further workup or management at that time, unclear if she has remained out of rhythm -- HR was 70s-90s during ED visit earlier this month, here 120s on arrival this admission  -- metoprolol  was increased to 50mg  BID and Diltiazem 120mg  daily added. HR still elevated this morning, will increase Diltiazem to 180mg  daily -- continue Eliquis 5mg  BID, she has patient assistance forms (husband was taking them to PCP office today) -- focus is rate control at this time, she is not interesting in DCCV but also need to ensure she can remain on [facility] -- consider outpatient sleep study if she is willing   Lower extremity edema, dyspnea, mild RV dysfunction -- suspect multifactorial from obesity, persistent atrial fib, cannot exclude contribution from undiagnosed OSA and component of acute diastolic HF though habitus makes it challenging to definitively say (BNP can be falsely low in this BMI) -- patient reports edema actually better    HTN  -- continue metoprolol 50mg  BID, Diltiazem increase to 180mg  daily -- also on losartan, Jardiance   Hypomagnesemia/hypokalemia -- K+ 3.6, Mag 1.5 -- supplement  DM -- Hgb A1c 7.0 -- management per IM  For questions or updates, please contact [facility] Please consult [email or web address] for contact info under        Signed, Laverda Page, NP   I have personally seen and examined this patient. I agree with the assessment and plan as outlined above.  Plan for rate control of her atrial fib. HR is controlled  currently. Rates 90-100 bpm. Elevated rates overnight. Will increase Cardizem. Continue metoprolol. Continue Eliquis.   Verne Carrow, MD, [facility] 02/27/2023 10:13 AM

## 2023-02-27 NOTE — Progress Notes (Signed)
Ortho Tech called to apply TLSO brace.

## 2023-02-28 DIAGNOSIS — M549 Dorsalgia, unspecified: Secondary | ICD-10-CM | POA: Diagnosis not present

## 2023-02-28 DIAGNOSIS — I4891 Unspecified atrial fibrillation: Secondary | ICD-10-CM | POA: Diagnosis not present

## 2023-02-28 DIAGNOSIS — D72825 Bandemia: Secondary | ICD-10-CM | POA: Diagnosis not present

## 2023-02-28 DIAGNOSIS — E876 Hypokalemia: Secondary | ICD-10-CM | POA: Diagnosis not present

## 2023-02-28 LAB — GLUCOSE, CAPILLARY
Glucose-Capillary: 108 mg/dL — ABNORMAL HIGH (ref 70–99)
Glucose-Capillary: 151 mg/dL — ABNORMAL HIGH (ref 70–99)
Glucose-Capillary: 115 mg/dL — ABNORMAL HIGH (ref 70–99)
Glucose-Capillary: 145 mg/dL — ABNORMAL HIGH (ref 70–99)

## 2023-02-28 LAB — RENAL FUNCTION PANEL
Albumin: 2.9 g/dL — ABNORMAL LOW (ref 3.5–5.0)
Anion gap: 8 (ref 5–15)
BUN: 8 mg/dL (ref 8–23)
CO2: 25 mmol/L (ref 22–32)
Calcium: 8.6 mg/dL — ABNORMAL LOW (ref 8.9–10.3)
Chloride: 102 mmol/L (ref 98–111)
Creatinine, Ser: 0.64 mg/dL (ref 0.44–1.00)
GFR, Estimated: >60 mL/min (ref >60)
Glucose, Bld: 126 mg/dL — ABNORMAL HIGH (ref 70–99)
Phosphorus: 3.4 mg/dL (ref 2.5–4.6)
Potassium: 3.3 mmol/L — ABNORMAL LOW (ref 3.5–5.1)
Sodium: 135 mmol/L (ref 135–145)

## 2023-02-28 LAB — BASIC METABOLIC PANEL
Anion gap: 8 (ref 5–15)
BUN: 8 mg/dL (ref 8–23)
CO2: 26 mmol/L (ref 22–32)
Calcium: 8.5 mg/dL — ABNORMAL LOW (ref 8.9–10.3)
Chloride: 103 mmol/L (ref 98–111)
Creatinine, Ser: 0.77 mg/dL (ref 0.44–1.00)
GFR, Estimated: >60 mL/min (ref >60)
Glucose, Bld: 100 mg/dL — ABNORMAL HIGH (ref 70–99)
Potassium: 3.6 mmol/L (ref 3.5–5.1)
Sodium: 137 mmol/L (ref 135–145)

## 2023-02-28 LAB — APTT: aPTT: 68 s — ABNORMAL HIGH (ref 24–36)

## 2023-02-28 LAB — CBC
HCT: 43.3 % (ref 36.0–46.0)
Hemoglobin: 13.6 g/dL (ref 12.0–15.0)
MCH: 27.5 pg (ref 26.0–34.0)
MCHC: 31.4 g/dL (ref 30.0–36.0)
MCV: 87.5 fL (ref 80.0–100.0)
Platelets: 260 10*3/uL (ref 150–400)
RBC: 4.95 MIL/uL (ref 3.87–5.11)
RDW: 14.1 % (ref 11.5–15.5)
WBC: 12 10*3/uL — ABNORMAL HIGH (ref 4.0–10.5)
nRBC: 0 % (ref 0.0–0.2)

## 2023-02-28 LAB — MAGNESIUM: Magnesium: 1.7 mg/dL (ref 1.7–2.4)

## 2023-02-28 MED ORDER — MAGNESIUM OXIDE -MG SUPPLEMENT 400 (240 MG) MG PO TABS
400.0000 mg | ORAL_TABLET | Freq: Two times a day (BID) | ORAL | Status: AC
  Administered 2023-02-28 (×2): 400 mg via ORAL
  Filled 2023-02-28 (×2): qty 1

## 2023-02-28 MED ORDER — DILTIAZEM HCL ER COATED BEADS 240 MG PO CP24
240.0000 mg | ORAL_CAPSULE | Freq: Every day | ORAL | Status: DC
  Administered 2023-02-28: 240 mg via ORAL
  Filled 2023-02-28: qty 1

## 2023-02-28 MED ORDER — POLYETHYLENE GLYCOL 3350 17 G PO PACK
17.0000 g | PACK | Freq: Two times a day (BID) | ORAL | Status: AC
  Administered 2023-03-01 (×2): 17 g via ORAL
  Filled 2023-02-28 (×4): qty 1

## 2023-02-28 MED ORDER — HEPARIN BOLUS VIA INFUSION
2000.0000 [IU] | Freq: Once | INTRAVENOUS | Status: AC
  Administered 2023-02-28: 2000 [IU] via INTRAVENOUS
  Filled 2023-02-28: qty 2000

## 2023-02-28 MED ORDER — HEPARIN (PORCINE) 25000 UT/250ML-% IV SOLN
1200.0000 [IU]/h | INTRAVENOUS | Status: DC
  Administered 2023-02-28 – 2023-03-01 (×3): 1500 [IU]/h via INTRAVENOUS
  Administered 2023-03-02: 1400 [IU]/h via INTRAVENOUS
  Administered 2023-03-03: 1200 [IU]/h via INTRAVENOUS
  Filled 2023-02-28 (×5): qty 250

## 2023-02-28 MED ORDER — HYDROMORPHONE HCL 1 MG/ML IJ SOLN
1.0000 mg | Freq: Once | INTRAMUSCULAR | Status: AC
  Administered 2023-02-28: 1 mg via INTRAVENOUS

## 2023-02-28 MED ORDER — CEFAZOLIN SODIUM-DEXTROSE 2-4 GM/100ML-% IV SOLN
2.0000 g | INTRAVENOUS | Status: DC
  Filled 2023-02-28: qty 100

## 2023-02-28 MED ORDER — POTASSIUM CHLORIDE CRYS ER 20 MEQ PO TBCR
40.0000 meq | EXTENDED_RELEASE_TABLET | Freq: Two times a day (BID) | ORAL | Status: AC
  Administered 2023-02-28 (×2): 40 meq via ORAL
  Filled 2023-02-28 (×2): qty 2

## 2023-02-28 MED ORDER — PANTOPRAZOLE SODIUM 40 MG PO TBEC
40.0000 mg | DELAYED_RELEASE_TABLET | Freq: Two times a day (BID) | ORAL | Status: DC
  Administered 2023-02-28 – 2023-03-05 (×11): 40 mg via ORAL
  Filled 2023-02-28 (×11): qty 1

## 2023-02-28 MED ORDER — MAGNESIUM SULFATE 2 GM/50ML IV SOLN
2.0000 g | Freq: Once | INTRAVENOUS | Status: AC
  Administered 2023-02-28: 2 g via INTRAVENOUS
  Filled 2023-02-28: qty 50

## 2023-02-28 NOTE — Consult Note (Addendum)
Chief Complaint: Patient was seen in consultation today for L1 KP Chief Complaint  Patient presents with   Spasms   at the request of Dr David Stall   Supervising Physician: Julieanne Cotton  Patient Status: [facility] - In-pt  History of Present Illness: Samella Sisko is a 62 y.o. female   FULL Code status per pt and husband Pt has long hx of low back pain Noted new pain and worsening when bent to help her husband on Nov 3 Has seen PCP and ED--steroids and muscle relaxers Rx--- no real help Pain worsening and back to ED 11/17  MR 11/18: MPRESSION: 1. Acute/subacute inferior endplate compression fracture of the L1 vertebral body with moderate anterior height loss and 5 mm retropulsion of the posterior cortex.  MR 11/19: IMPRESSION: 1. L3 and L4 vertebral body lesions seen on yesterday's MRI do not enhance and are likely benign. Consider follow-up MRI in 3-6 months to evaluate for interval change. 2. Unchanged L1 inferior endplate compression fracture.  Request made for Lumbar 1 vertebroplasty/kyphoplasty Dr Corliss Skains has reviewed imaging and approves procedure Insurance has been approved Scheduled for procedure 11/22 in IR   Past Medical History:  Diagnosis Date   Atrial fibrillation (HCC)    Celiac artery dissection (HCC)    Diabetes mellitus (HCC)    History of DVT (deep vein thrombosis) 2010   Hyperlipidemia    Hypertension    Morbid obesity (HCC)     Past Surgical History:  Procedure Laterality Date   ABDOMINAL HYSTERECTOMY     FRACTURE SURGERY Left    hand   KNEE ARTHROSCOPY Right 1990s    Allergies: Codeine and Crestor [rosuvastatin]  Medications: Prior to Admission medications   Medication Sig Start Date End Date Taking? Authorizing Provider  amLODipine (NORVASC) 10 MG tablet Take 1 tablet (10 mg total) by mouth daily. 02/26/20  Yes Shade Flood, MD  aspirin EC 81 MG tablet Take 81 mg by mouth daily.   Yes [provider]   empagliflozin (JARDIANCE) 25 MG TABS tablet Take 25 mg by mouth daily.   Yes [provider]  HYDROcodone-acetaminophen (NORCO/VICODIN) 5-325 MG tablet Take 1 tablet by mouth every 6 (six) hours as needed for severe pain (pain score 7-10). 02/22/23  Yes Smoot, Sarah A, PA-C  ibuprofen (ADVIL) 800 MG tablet Take 1 tablet (800 mg total) by mouth 3 (three) times daily. 02/21/23  Yes Smoot, Sarah A, PA-C  losartan (COZAAR) 100 MG tablet TAKE 1 TABLET(100 MG) BY MOUTH DAILY 02/26/20  Yes Shade Flood, MD  metoprolol succinate (TOPROL-XL) 50 MG 24 hr tablet Take 50 mg by mouth daily. Take with or immediately following a meal.   Yes [provider]  predniSONE (DELTASONE) 10 MG tablet Take 10-60 mg by mouth See admin instructions. Take 60 mg (6 tablets) daily for 2 days, 50 mg (5 tablets) daily for 2 days, 40 mg (4 tablets) for 2 days, 30 mg (3 tablets) daily for 2 days, 20 mg ( 2 tablets) daily for 2 days, 10 mg (1 tablet) daily for 2 days.   Yes [provider]  vitamin B-12 (CYANOCOBALAMIN) 500 MCG tablet Take 500 mcg by mouth daily.   Yes [provider]  lidocaine (LIDODERM) 5 % Place 1 patch onto the skin daily. Remove & Discard patch within 12 hours or as directed by MD Patient not taking: Reported on 02/24/2023 02/22/23   Smoot, Shawn Route, PA-C     Family History  Problem  Relation Age of Onset   Hypertension Mother    Heart attack Father    Heart disease Brother    Healthy Son    Breast cancer Neg Hx     Social History   Socioeconomic History   Marital status: Married    Spouse name: Not on file   Number of children: Not on file   Years of education: Not on file   Highest education level: Not on file  Occupational History   Not on file  Tobacco Use   Smoking status: Former    Current packs/day: 0.00    Average packs/day: 1 pack/day for 20.0 years (20.0 ttl pk-yrs)    Types: Cigarettes    Start date: 4    Quit date: 2010    Years since  quitting: 14.8   Smokeless tobacco: Never  Vaping Use   Vaping status: Never Used  Substance and Sexual Activity   Alcohol use: Never   Drug use: Not Currently   Sexual activity: Yes  Other Topics Concern   Not on file  Social History Narrative   Not on file   Social Determinants of Health   Financial Resource Strain: Not on file  Food Insecurity: No Food Insecurity (02/24/2023)   Hunger Vital Sign    Worried About Running Out of Food in the Last Year: Never true    Ran Out of Food in the Last Year: Never true  Transportation Needs: No Transportation Needs (02/24/2023)   PRAPARE - Administrator, Civil Service (Medical): No    Lack of Transportation (Non-Medical): No  Physical Activity: Not on file  Stress: Not on file  Social Connections: Not on file    Review of Systems: A 12 point ROS discussed and pertinent positives are indicated in the HPI above.  All other systems are negative.  Review of Systems  Constitutional:  Positive for activity change. Negative for fatigue and fever.  Respiratory:  Negative for cough and shortness of breath.   Cardiovascular:  Negative for chest pain.  Gastrointestinal:  Negative for abdominal pain and nausea.  Musculoskeletal:  Positive for back pain and gait problem.  Neurological:  Negative for weakness.  Psychiatric/Behavioral:  Negative for behavioral problems and confusion.     Vital Signs: BP (!) 147/97 (BP Location: Left Arm)   Pulse 94   Temp 97.6 F (36.4 C) (Oral)   Resp 11   Ht 5' 10.51" (1.791 m)   Wt (!) 315 lb 6.4 oz (143.1 kg)   SpO2 91%   BMI 44.60 kg/m     Physical Exam Vitals reviewed.  HENT:     Mouth/Throat:     Mouth: Mucous membranes are moist.  Cardiovascular:     Rate and Rhythm: Normal rate and regular rhythm.     Heart sounds: Normal heart sounds.  Pulmonary:     Effort: Pulmonary effort is normal.     Breath sounds: Normal breath sounds. No wheezing.  Abdominal:     Palpations:  Abdomen is soft.  Musculoskeletal:        General: Normal range of motion.     Comments: Low back pain  Skin:    General: Skin is warm.  Neurological:     Mental Status: She is alert and oriented to person, place, and time.  Psychiatric:        Behavior: Behavior normal.     Comments: Groggy from meds Consented with Husband at bedside      Imaging:  MR LUMBAR SPINE W CONTRAST  Result Date: 02/26/2023 CLINICAL DATA:  Follow-up L3 and L4 vertebral body lesions seen on yesterday's lumbar spine MRI. Acute L1 compression fracture. EXAM: MRI LUMBAR SPINE WITH CONTRAST TECHNIQUE: Multiplanar and multiecho pulse sequences of the lumbar spine were obtained with intravenous contrast. CONTRAST:  10mL GADAVIST GADOBUTROL 1 MMOL/ML IV SOLN COMPARISON:  MRI lumbar spine from yesterday. Lumbar spine x-rays dated February 24, 2023. FINDINGS: Segmentation:  Standard. Alignment:  Physiologic. Vertebrae: Unchanged acute to subacute L1 inferior endplate compression fracture with 5 mm retropulsion of the posteroinferior cortex. L3 and L4 vertebral body lesion seen on yesterday's MRI do not enhance. No suspicious bone lesion. Mild degenerative endplate enhancement L4-L5. Conus medullaris and cauda equina: Conus extends to the L1 level. Conus and cauda equina appear normal. No abnormal intrathecal enhancement. Paraspinal and other soft tissues: 3.7 cm simple cyst in the upper pole of the left kidney. No follow-up imaging is recommended. Otherwise negative. Disc levels: Similar mild multilevel degenerative changes as described on yesterday's MRI. No stenosis. IMPRESSION: 1. L3 and L4 vertebral body lesions seen on yesterday's MRI do not enhance and are likely benign. Consider follow-up MRI in 3-6 months to evaluate for interval change. 2. Unchanged L1 inferior endplate compression fracture. Electronically Signed   By: Obie Dredge M.D.   On: 02/26/2023 16:14   ECHOCARDIOGRAM COMPLETE  Result Date: 02/25/2023     ECHOCARDIOGRAM REPORT   Patient Name:   YSABEL YETT Belloso Date of Exam: 02/25/2023 Medical Rec #:  [id number]           Height:       70.5 in Accession #:    [id number]          Weight:       319.0 lb Date of Birth:  10/04/1960           BSA:          2.559 m Patient Age:    62 years            BP:           123/74 mmHg Patient Gender: F                   HR:           100 bpm. Exam Location:  Inpatient Procedure: 2D Echo, Cardiac Doppler, Color Doppler and Intracardiac            Opacification Agent Indications:    A-fib  History:        Patient has no prior history of Echocardiogram examinations.                 Arrythmias:Atrial Fibrillation; Risk Factors:Hypertension,                 Dyslipidemia, Diabetes and Former Smoker.  Sonographer:    Dondra Prader RVT RCS Referring Phys: [id number] [facility]  Sonographer Comments: Technically challenging study due to limited acoustic windows, Technically difficult study due to poor echo windows and suboptimal apical window. Image acquisition challenging due to patient body habitus and Image acquisition challenging due to respiratory motion. Patient supine; limited and poor windows IMPRESSIONS  1. Left ventricular ejection fraction, by estimation, is 60 to 65%. The left ventricle has normal function. The left ventricle has no regional wall motion abnormalities. There is mild left ventricular hypertrophy.  2. Right ventricular systolic function reduced. The right ventricular size is grossly normal. There is normal pulmonary artery systolic pressure.  3. Left atrial size was mildly dilated.  4. Right atrial size was moderately dilated.  5. The mitral valve is normal in structure. Mild mitral valve regurgitation. No evidence of mitral stenosis.  6. The aortic valve was not well visualized. Aortic valve regurgitation is trivial. No aortic stenosis is present.  7. Aortic dilatation noted. There is dilatation of the ascending aorta, measuring 39 mm.  8. The inferior vena cava  is dilated in size with >50% respiratory variability, suggesting right atrial pressure of 8 mmHg. Comparison(s): No prior Echocardiogram. FINDINGS  Left Ventricle: Left ventricular ejection fraction, by estimation, is 60 to 65%. The left ventricle has normal function. The left ventricle has no regional wall motion abnormalities. Definity contrast agent was given IV to delineate the left ventricular  endocardial borders. The left ventricular internal cavity size was normal in size. There is mild left ventricular hypertrophy. Left ventricular diastolic function could not be evaluated due to atrial fibrillation. Right Ventricle: The right ventricular size is grossly normal. Right vetricular wall thickness was not well visualized. Right ventricular systolic function reduced. There is normal pulmonary artery systolic pressure. The tricuspid regurgitant velocity is  2.52 m/s, and with an assumed right atrial pressure of 8 mmHg, the estimated right ventricular systolic pressure is 33.4 mmHg. Left Atrium: Left atrial size was mildly dilated. Right Atrium: Right atrial size was moderately dilated. Pericardium: There is no evidence of pericardial effusion. Mitral Valve: The mitral valve is normal in structure. Mild mitral valve regurgitation. No evidence of mitral valve stenosis. Tricuspid Valve: The tricuspid valve is normal in structure. Tricuspid valve regurgitation is mild . No evidence of tricuspid stenosis. Aortic Valve: The aortic valve was not well visualized. Aortic valve regurgitation is trivial. No aortic stenosis is present. Aortic valve mean gradient measures 2.0 mmHg. Aortic valve peak gradient measures 4.1 mmHg. Aortic valve area, by VTI measures 3.21 cm. Pulmonic Valve: The pulmonic valve was not well visualized. Pulmonic valve regurgitation is not visualized. No evidence of pulmonic stenosis. Aorta: The aortic root is normal in size and structure and aortic dilatation noted. There is dilatation of the  ascending aorta, measuring 39 mm. Venous: The inferior vena cava is dilated in size with greater than 50% respiratory variability, suggesting right atrial pressure of 8 mmHg. IAS/Shunts: The atrial septum is grossly normal.  LEFT VENTRICLE PLAX 2D LVIDd:         5.00 cm   Diastology LVIDs:         2.90 cm   LV e' medial:    13.10 cm/s LV PW:         1.30 cm   LV E/e' medial:  6.1 LV IVS:        1.30 cm   LV e' lateral:   13.20 cm/s LVOT diam:     2.30 cm   LV E/e' lateral: 6.1 LV SV:         55 LV SV Index:   21 LVOT Area:     4.15 cm  RIGHT VENTRICLE         IVC TAPSE (M-mode): 1.3 cm  IVC diam: 2.40 cm LEFT ATRIUM              Index        RIGHT ATRIUM           Index LA diam:        4.90 cm  1.91 cm/m   RA Area:     28.80 cm LA Vol (A2C):  121.0 ml 47.29 ml/m  RA Volume:   97.30 ml  38.02 ml/m LA Vol (A4C):   83.6 ml  32.67 ml/m LA Biplane Vol: 101.0 ml 39.47 ml/m  AORTIC VALVE                    PULMONIC VALVE AV Area (Vmax):    3.31 cm     PV Vmax:       0.76 m/s AV Area (Vmean):   3.07 cm     PV Peak grad:  2.3 mmHg AV Area (VTI):     3.21 cm AV Vmax:           100.82 cm/s AV Vmean:          69.600 cm/s AV VTI:            0.170 m AV Peak Grad:      4.1 mmHg AV Mean Grad:      2.0 mmHg LVOT Vmax:         80.22 cm/s LVOT Vmean:        51.380 cm/s LVOT VTI:          0.132 m LVOT/AV VTI ratio: 0.77  AORTA Ao Root diam: 3.70 cm Ao Asc diam:  3.90 cm MITRAL VALVE               TRICUSPID VALVE MV Area (PHT): 5.50 cm    TR Peak grad:   25.4 mmHg MV Decel Time: 138 msec    TR Vmax:        252.00 cm/s MV E velocity: 80.20 cm/s                            SHUNTS                            Systemic VTI:  0.13 m                            Systemic Diam: 2.30 cm Sunit Tolia Electronically signed by Tessa Lerner Signature Date/Time: 02/25/2023/3:20:05 PM    Final    MR LUMBAR SPINE WO CONTRAST  Result Date: 02/25/2023 CLINICAL DATA:  Low back pain, symptoms persist with > 6 wks treatment. EXAM: MRI LUMBAR  SPINE WITHOUT CONTRAST TECHNIQUE: Multiplanar, multisequence MR imaging of the lumbar spine was performed. No intravenous contrast was administered. COMPARISON:  Lumbar spine radiographs 02/24/2023. FINDINGS: Segmentation: Conventional numbering is assumed with 5 non-rib-bearing, lumbar type vertebral bodies. Alignment:  Normal. Vertebrae: Inferior endplate compression fracture of the L1 vertebral body with moderate anterior height loss, associated marrow edema and 5 mm retropulsion of the posterior cortex. Irregular T1 hypointense and T2 hyperintense lesions in the L3 and L4 vertebral bodies (sagittal images 9 series 4 and 3). Modic type 1 degenerative endplate marrow signal changes at L4-5. Conus medullaris and cauda equina: Conus extends to the L1 level. Conus and cauda equina appear normal. Paraspinal and other soft tissues: Mild fatty atrophy of the paraspinal muscles. Disc levels: T12-L1:  Small right central disc protrusion. L1-L2:  Mild bilateral facet arthropathy. L2-L3: Small disc bulge and mild bilateral facet arthropathy. No spinal canal stenosis or neural foraminal narrowing. L3-L4: Small disc bulge and mild bilateral facet arthropathy. No spinal canal stenosis or neural foraminal narrowing. L4-L5: Small disc bulge and mild bilateral facet arthropathy. No spinal canal stenosis or  neural foraminal narrowing. L5-S1:  Mild bilateral facet arthropathy. IMPRESSION: 1. Acute/subacute inferior endplate compression fracture of the L1 vertebral body with moderate anterior height loss and 5 mm retropulsion of the posterior cortex. 2. Irregular T1 hypointense and T2 hyperintense lesions in the L3 and L4 vertebral bodies, concerning for possible metastatic disease. Recommend further evaluation with contrast-enhanced lumbar spine MRI. 3. Mild multilevel lumbar spondylosis without spinal canal stenosis or neural foraminal narrowing. Electronically Signed   By: Orvan Falconer M.D.   On: 02/25/2023 09:57   DG Lumbar  Spine 2-3 Views  Result Date: 02/24/2023 CLINICAL DATA:  620110 Intractable back pain 620110. EXAM: LUMBAR SPINE - 2-3 VIEW COMPARISON:  12/02/2020. FINDINGS: There are 5 nonrib-bearing lumbar vertebrae. There is loss of lumbar lordosis, which may be on the basis of positioning or due to muscle spasm. There is focal kyphosis at L1 level due to moderate anterior wedging deformity of L1 vertebrae, which is new since the prior study. No significant retropulsion or spinal canal compromise. No spondylolisthesis. Moderate anterior wedging deformity of L1 vertebrae. Remaining vertebral body heights are maintained. No aggressive osseous lesion. Mild multilevel degenerative changes in the form of facet arthropathy and marginal osteophyte formation. Sacroiliac joints are symmetric. Visualized soft tissues are within normal limits. IMPRESSION: *Age indeterminate moderate anterior wedging deformity of L1 vertebra, new since the prior study from 2022. No significant retropulsion or spinal canal compromise. Correlate clinically to determine the need for additional imaging with MRI lumbar spine. Electronically Signed   By: Jules Schick M.D.   On: 02/24/2023 11:46   DG HIP UNILAT WITH PELVIS 1V RIGHT  Result Date: 02/24/2023 CLINICAL DATA:  Lower back pain radiating to right hip EXAM: DG HIP (WITH OR WITHOUT PELVIS) 1V RIGHT COMPARISON:  12/02/2020 FINDINGS: Frontal view of the pelvis and frogleg lateral view of the right hip are obtained. On the frontal view of the pelvis the bilateral greater trochanters are excluded by collimation. There are no acute displaced fractures identified on this examination limited by technique and patient body habitus. There is severe bilateral hip osteoarthritis, with severe joint space narrowing, marginal osteophyte formation, and bony remodeling of the femoral head and acetabuli bilaterally. Progressive changes are seen on the right since prior study. Sacroiliac joints are normal.  IMPRESSION: 1. Study limited by technique and body habitus. 2. Severe bilateral hip osteoarthritis, which has progressed on the right since prior study. No prior imaging of the left hip. 3. No evidence of displaced fracture. Electronically Signed   By: Sharlet Salina M.D.   On: 02/24/2023 11:45   DG Chest Portable 1 View  Result Date: 02/24/2023 CLINICAL DATA:  Heart palpitations. EXAM: PORTABLE CHEST 1 VIEW COMPARISON:  PA chest 01/04/2021 FINDINGS: There is moderate to severe cardiomegaly increased from 01/04/2009. Central vessels are normal caliber. There is no overt edema. The lungs are clear. There is no substantial pleural effusion. The mediastinum is stable. Thoracic cage is intact. IMPRESSION: Moderate to severe cardiomegaly increased from 01/04/2009. No overt edema or pleural effusion. Normal caliber vascular markings. Electronically Signed   By: Almira Bar M.D.   On: 02/24/2023 06:02    Labs:  CBC: Recent Labs    02/25/23 0112 02/26/23 0922 02/27/23 0603 02/28/23 0441  WBC 13.7* 11.6* 12.0* 12.0*  HGB 13.5 14.6 14.0 13.6  HCT 43.7 46.8* 44.8 43.3  PLT 291 287 256 260    COAGS: No results for input(s): "INR", "APTT" in the last 8760 hours.  BMP: Recent Labs  02/26/23 0922 02/27/23 0603 02/28/23 0441 02/28/23 0920  NA 135 136 137 135  K 4.2 3.6 3.6 3.3*  CL 102 102 103 102  CO2 23 26 26 25   GLUCOSE 121* 111* 100* 126*  BUN 12 10 8 8   CALCIUM 8.5* 8.6* 8.5* 8.6*  CREATININE 0.68 0.73 0.77 0.64  GFRNONAA >60 >60 >60 >60    LIVER FUNCTION TESTS: Recent Labs    02/28/23 0920  ALBUMIN 2.9*    TUMOR MARKERS: No results for input(s): "AFPTM", "CEA", "CA199", "CHROMGRNA" in the last 8760 hours.  Assessment and Plan:  Scheduled for Lumbar 1 Vertebroplasty/Kyphoplasty in IR 11/22 Risks and benefits of Lumbar 1 VP/KP were discussed with the patient including, but not limited to education regarding the natural healing process of compression fractures without  intervention, bleeding, infection, cement migration which may cause spinal cord damage, paralysis, pulmonary embolism or even death.  This interventional procedure involves the use of X-rays and because of the nature of the planned procedure, it is possible that we will have prolonged use of X-ray fluoroscopy.  Potential radiation risks to you include (but are not limited to) the following: - A slightly elevated risk for cancer  several years later in life. This risk is typically less than 0.5% percent. This risk is low in comparison to the normal incidence of human cancer, which is 33% for women and 50% for men according to the [employer]. - Radiation induced injury can include skin redness, resembling a rash, tissue breakdown / ulcers and hair loss (which can be temporary or permanent).   The likelihood of either of these occurring depends on the difficulty of the procedure and whether you are sensitive to radiation due to previous procedures, disease, or genetic conditions.   IF your procedure requires a prolonged use of radiation, you will be notified and given written instructions for further action.  It is your responsibility to monitor the irradiated area for the 2 weeks following the procedure and to notify your physician if you are concerned that you have suffered a radiation induced injury.    All of the patient's questions were answered, patient is agreeable to proceed. Consent signed and in chart.  Pt now on Hep drip--- IR will call to Hold Hep few hours before procedure  Thank you for this interesting consult.  I greatly enjoyed meeting Stephaney Dicostanzo and look forward to participating in their care.  A copy of this report was sent to the requesting provider on this date.  Electronically Signed: Robet Leu, PA-C 02/28/2023, 11:11 AM   I spent a total of 20 Minutes    in face to face in clinical consultation, greater than 50% of which was  counseling/coordinating care for L1 VP/KP

## 2023-02-28 NOTE — Progress Notes (Signed)
[facility] PROGRESS NOTE    Progress Note  Tracie [name]  ZOX:[id number] DOB: Aug 18, 1960 DOA: 02/24/2023 PCP: Aliene Beams, MD     Brief Narrative:   Tracie [name] is an 62 y.o. female past medical history of hypertension diabetes mellitus type 2, chronic atrial fibrillation and history of DVT on apixaban, Tracie [name] seen in the ED in 02/21/2023 for back pain diagnosed with sciatica sent home with medications, saw her PCP but was given muscle relaxer and is steroid taper comes to the ED complaining of back pain was found to be in A-fib with RVR.  She says she was getting better but the pain came back worst no neurological symptoms.  Assessment/Plan:   Atrial fibrillation with RVR (HCC): Now on diltiazem and metoprolol for rate control. Chads Vascor greater than 4. 2D echo was done that showed an EF of 60% no wall motion abnormality. Cardiology was consulted they will focus on HR control strategy, will need a sleep study as an outpatient. Try to keep potassium greater than 4 magnesium greater than 2. Use IV metoprolol as needed. She is not a good candidate for warfarin.  TOC consulted for anticoagulation. Holding eliquis for procedure.`  Lumbar pain/L1 compression fracture/osteoporosis: MRI of the lumbar spine showed acute/subacute inferior endplate compression fracture of L1 with moderate height loss and 5 mm retropulsion of the posterior cortex.  Also there was a irregular T1-T2 L3 and L4 vertebral body lesion concerning for possible metastatic disease.   Repeat MRI with contrast showed body lesions are not enhancing or likely to be benign. She has required multiple ongoing doses of IV morphine and Flexeril. She continues to have significant pain on morphine and Flexeril. Will go ahead and change her to hydromorphone. IR was consulted for possible vertebroplasty after Eliquis wears off. Started on a bowel regimen.  Osteoporosis: Follow-up PCP as an outpatient will  need to be on calcium, vitamin D and biphosphonate's  Hypokalemia/hypomagnesemia: Replete recheck magnesium. Check a potassium greater than 4 magnesium greater than 2.  Leukocytosis: Likely due to steroids she has remained afebrile. On steroids for 2 more days.  Controlled diabetes mellitus type 2: Resumed statin Jardiance. Continue long-acting insulin plus sliding scale CBGs ACH S, A1c of 7.0. Blood glucose well-controlled.  Essential hypertension: Currently on Cardizem, losartan and metoprolol for blood pressure still elevated.  Hyperlipidemia: Continue statins.  Lower extremity edema/mild RV dysfunction: Suspect multifactorial in the setting of obesity A-fib, probably undiagnosed such as sleep apnea. Cardiology recommended sleep study as an outpatient.    DVT prophylaxis: Eliquis Family Communication:none Status is: Observation The patient remains OBS appropriate and will d/c before 2 midnights.    Code Status:     Code Status Orders  (From admission, onward)           Start     Ordered   02/24/23 0737  Full code  Continuous       Question:  By:  Answer:  Consent: discussion documented in EHR   02/24/23 0739           Code Status History     This patient has a current code status but no historical code status.         IV Access:   Peripheral IV   Procedures and diagnostic studies:   MR LUMBAR SPINE W CONTRAST  Result Date: 02/26/2023 CLINICAL DATA:  Follow-up L3 and L4 vertebral body lesions seen on yesterday's lumbar spine MRI. Acute L1 compression fracture. EXAM: MRI LUMBAR SPINE  WITH CONTRAST TECHNIQUE: Multiplanar and multiecho pulse sequences of the lumbar spine were obtained with intravenous contrast. CONTRAST:  10mL GADAVIST GADOBUTROL 1 MMOL/ML IV SOLN COMPARISON:  MRI lumbar spine from yesterday. Lumbar spine x-rays dated February 24, 2023. FINDINGS: Segmentation:  Standard. Alignment:  Physiologic. Vertebrae: Unchanged acute to  subacute L1 inferior endplate compression fracture with 5 mm retropulsion of the posteroinferior cortex. L3 and L4 vertebral body lesion seen on yesterday's MRI do not enhance. No suspicious bone lesion. Mild degenerative endplate enhancement L4-L5. Conus medullaris and cauda equina: Conus extends to the L1 level. Conus and cauda equina appear normal. No abnormal intrathecal enhancement. Paraspinal and other soft tissues: 3.7 cm simple cyst in the upper pole of the left kidney. No follow-up imaging is recommended. Otherwise negative. Disc levels: Similar mild multilevel degenerative changes as described on yesterday's MRI. No stenosis. IMPRESSION: 1. L3 and L4 vertebral body lesions seen on yesterday's MRI do not enhance and are likely benign. Consider follow-up MRI in 3-6 months to evaluate for interval change. 2. Unchanged L1 inferior endplate compression fracture. Electronically Signed   By: Obie Dredge M.D.   On: 02/26/2023 16:14     Medical Consultants:   None.   Subjective:    Tracie [name] relate her pain is not controlled has not had a bowel movement.  Objective:    Vitals:   02/27/23 1333 02/27/23 2015 02/27/23 2207 02/28/23 0359  BP: (!) 140/74  (!) 145/97 (!) 125/97  Pulse: 91  95 (!) 101  Resp: 16   20  Temp: 98.2 F (36.8 C) 98.4 F (36.9 C)  97.6 F (36.4 C)  TempSrc: Oral Oral  Oral  SpO2: 92%   97%  Weight:    (!) 143.1 kg  Height:       SpO2: 97 %   Intake/Output Summary (Last 24 hours) at 02/28/2023 0726 Last data filed at 02/27/2023 1500 Gross per 24 hour  Intake 104.67 ml  Output --  Net 104.67 ml    Filed Weights   02/26/23 0430 02/27/23 0328 02/28/23 0359  Weight: (!) 143.6 kg (!) 139.2 kg (!) 143.1 kg    Exam: General exam: In no acute distress. Respiratory system: Good air movement and clear to auscultation. Cardiovascular system: S1 & S2 heard, RRR. No JVD. Gastrointestinal system: Abdomen is nondistended, soft and nontender.   Extremities: No pedal edema. Skin: No rashes, lesions or ulcers Psychiatry: Judgement and insight appear normal. Mood & affect appropriate. Data Reviewed:    Labs: Basic Metabolic Panel: Recent Labs  Lab 02/24/23 0548 02/24/23 0748 02/25/23 0112 02/26/23 0922 02/27/23 0603 02/28/23 0441  NA 135  --  138 135 136 137  K 3.3*  --  3.2* 4.2 3.6 3.6  CL 104  --  106 102 102 103  CO2 22  --  25 23 26 26   GLUCOSE 186*  --  175* 121* 111* 100*  BUN 13  --  12 12 10 8   CREATININE 0.69  --  0.70 0.68 0.73 0.77  CALCIUM 8.6*  --  8.7* 8.5* 8.6* 8.5*  MG  --  1.6*  --  1.7 1.5* 1.7   GFR Estimated Creatinine Clearance: 111.1 mL/min (by C-G formula based on SCr of 0.77 mg/dL). Liver Function Tests: No results for input(s): "AST", "ALT", "ALKPHOS", "BILITOT", "PROT", "ALBUMIN" in the last 168 hours. No results for input(s): "LIPASE", "AMYLASE" in the last 168 hours. No results for input(s): "AMMONIA" in the last 168 hours. Coagulation profile  No results for input(s): "INR", "PROTIME" in the last 168 hours. COVID-19 Labs  No results for input(s): "DDIMER", "FERRITIN", "LDH", "CRP" in the last 72 hours.  No results found for: "SARSCOV2NAA"  CBC: Recent Labs  Lab 02/24/23 0548 02/25/23 0112 02/26/23 0922 02/27/23 0603 02/28/23 0441  WBC 14.2* 13.7* 11.6* 12.0* 12.0*  NEUTROABS 11.6*  --   --   --   --   HGB 14.1 13.5 14.6 14.0 13.6  HCT 45.7 43.7 46.8* 44.8 43.3  MCV 89.4 90.1 89.7 88.4 87.5  PLT 316 291 287 256 260   Cardiac Enzymes: No results for input(s): "CKTOTAL", "CKMB", "CKMBINDEX", "TROPONINI" in the last 168 hours. BNP (last 3 results) No results for input(s): "PROBNP" in the last 8760 hours. CBG: Recent Labs  Lab 02/26/23 2149 02/27/23 0747 02/27/23 1157 02/27/23 1601 02/27/23 2154  GLUCAP 111* 137* 115* 134* 116*   D-Dimer: No results for input(s): "DDIMER" in the last 72 hours. Hgb A1c: No results for input(s): "HGBA1C" in the last 72  hours.  Lipid Profile: No results for input(s): "CHOL", "HDL", "LDLCALC", "TRIG", "CHOLHDL", "LDLDIRECT" in the last 72 hours.  Thyroid function studies: No results for input(s): "TSH", "T4TOTAL", "T3FREE", "THYROIDAB" in the last 72 hours.  Invalid input(s): "FREET3"  Anemia work up: No results for input(s): "VITAMINB12", "FOLATE", "FERRITIN", "TIBC", "IRON", "RETICCTPCT" in the last 72 hours. Sepsis Labs: Recent Labs  Lab 02/25/23 0112 02/26/23 0922 02/27/23 0603 02/28/23 0441  WBC 13.7* 11.6* 12.0* 12.0*   Microbiology No results found for this or any previous visit (from the past 240 hour(s)).   Medications:    diltiazem  180 mg Oral Daily   empagliflozin  25 mg Oral Daily   insulin aspart  0-15 Units Subcutaneous TID WC   insulin aspart  0-5 Units Subcutaneous QHS   insulin aspart  4 Units Subcutaneous TID WC   insulin detemir  5 Units Subcutaneous QHS   lidocaine  1 patch Transdermal Daily   losartan  100 mg Oral Daily   metoprolol succinate  50 mg Oral BID   Continuous Infusions:      LOS: 2 days   Marinda Elk  [facility]  02/28/2023, 7:26 AM

## 2023-02-28 NOTE — Plan of Care (Signed)
  Problem: Clinical Measurements: Goal: Ability to maintain clinical measurements within normal limits will improve Outcome: Progressing Goal: Will remain free from infection Outcome: Progressing Goal: Diagnostic test results will improve Outcome: Progressing Goal: Cardiovascular complication will be avoided Outcome: Progressing   Problem: Activity: Goal: Risk for activity intolerance will decrease Outcome: Progressing   

## 2023-02-28 NOTE — Progress Notes (Signed)
PHARMACY - ANTICOAGULATION CONSULT NOTE  Pharmacy Consult for heparin Indication: atrial fibrillation  Allergies  Allergen Reactions   Codeine Other (See Comments)    Unknown reaction   Crestor [Rosuvastatin] Other (See Comments)    Myalgias  Weakness    Patient Measurements: Height: 5' 10.51" (179.1 cm) Weight: (!) 143.1 kg (315 lb 6.4 oz) IBW/kg (Calculated) : 69.68 Heparin Dosing Weight: 104  Vital Signs: Temp: 97.7 F (36.5 C) (11/21 1545) Temp Source: Oral (11/21 1545) BP: 131/89 (11/21 1545) Pulse Rate: 88 (11/21 1545)  Labs: Recent Labs    02/26/23 0922 02/27/23 0603 02/28/23 0441 02/28/23 0920 02/28/23 1526  HGB 14.6 14.0 13.6  --   --   HCT 46.8* 44.8 43.3  --   --   PLT 287 256 260  --   --   APTT  --   --   --   --  68*  CREATININE 0.68 0.73 0.77 0.64  --     Estimated Creatinine Clearance: 111.1 mL/min (by C-G formula based on SCr of 0.64 mg/dL).   Medical History: Past Medical History:  Diagnosis Date   Atrial fibrillation (HCC)    Celiac artery dissection (HCC)    Diabetes mellitus (HCC)    History of DVT (deep vein thrombosis) 2010   Hyperlipidemia    Hypertension    Morbid obesity (HCC)       Assessment: 22 yoF admitted with AF started on apixaban. Pt with ongoing pain and known L1 compression so apixaban held and IR consulted for possible vertebroplasty. Pharmacy to dose IV heparin in meantime. Last apixaban dose was 11/20 am - will give small bolus to start given weight and monitor via aPTTs.  PTT therapeutic this PM   Goal of Therapy:  Heparin level 0.3-0.7 units/ml aPTT 66-102 seconds Monitor platelets by anticoagulation protocol: Yes   Plan:  Continue heparin at 1500 units / hr Follow up AM labs  Thank you Okey Regal, PharmD Please check AMION for all [facility] Pharmacy numbers 02/28/2023

## 2023-02-28 NOTE — Progress Notes (Addendum)
Patient Name: Tracie [name] Date of Encounter: 02/28/2023 [facility] Cardiologist: Parke Poisson, MD   Interval Summary  .    Patient reports ongoing "muscle spasm" this morning that are causing significant discomfort. Otherwise without focal complaints. No chest pain, shortness of breath, palpitations.   Vital Signs .    Vitals:   02/27/23 1333 02/27/23 2015 02/27/23 2207 02/28/23 0359  BP: (!) 140/74  (!) 145/97 (!) 125/97  Pulse: 91  95 (!) 101  Resp: 16   20  Temp: 98.2 F (36.8 C) 98.4 F (36.9 C)  97.6 F (36.4 C)  TempSrc: Oral Oral  Oral  SpO2: 92%   97%  Weight:    (!) 143.1 kg  Height:        Intake/Output Summary (Last 24 hours) at 02/28/2023 0755 Last data filed at 02/27/2023 1500 Gross per 24 hour  Intake 104.67 ml  Output --  Net 104.67 ml      02/28/2023    3:59 AM 02/27/2023    3:28 AM 02/26/2023    4:30 AM  Last 3 Weights  Weight (lbs) 315 lb 6.4 oz 306 lb 12.8 oz 316 lb 9.3 oz  Weight (kg) 143.065 kg 139.164 kg 143.6 kg      Telemetry/ECG    Persistent afib with intermittent RVR 100-130 - Personally Reviewed  Physical Exam .   GEN: No acute distress.   Neck: No JVD Cardiac: Irregularly irregular, no murmurs, rubs, or gallops.  Respiratory: Clear to auscultation bilaterally. GI: Soft, nontender, non-distended  MS: trace non-pitting edema  Assessment & Plan .     62 y.o. 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 David Stall.    Back pain, presenting complaint -- MRI with compression fracture and concern for possible metastatic disease, follow up MRI with contrast with benign lesions -- f/u MRI in 3-6 months   Persistent atrial fibrillation with RVR -- diagnosed 2021,  patient declined further workup or management at that time, unclear if she has remained out of rhythm -- HR was 70s-90s during ED visit earlier this month, here 120s on arrival this admission  -- metoprolol was increased to 50mg  BID and Diltiazem 120mg  daily added. HR still elevated yesterday morning, Diltiazem increased to 180mg  daily with mild improvement. Will increase to 240mg  today. -- Eliquis 5mg  BID stopped by primary team yesterday due to possible vertebroplasty. Will need to bridge with heparin. She has patient assistance forms for Eliquis (husband was taking them to PCP office) -- focus is rate control at this time, she is not interesting in DCCV but also need to ensure she can remain on [facility] -- consider outpatient sleep study if she is willing   Lower extremity edema, dyspnea, mild RV dysfunction -- suspect multifactorial from obesity, persistent atrial fib, cannot exclude contribution from undiagnosed OSA and component of acute diastolic HF though habitus makes it challenging to definitively say (BNP can be falsely low in this BMI) -- patient reports edema actually better    HTN  -- continue metoprolol 50mg  BID, Diltiazem increased to 180mg  daily 11/20. Will increase again to 240mg  today with ongoing RVR with stable BP.  -- also on losartan, Jardiance   Hypomagnesemia/hypokalemia -- K+ 3.6, Mag 1.7 (Goal K>4, Mg>2) --  supplement ordered   DM -- Hgb A1c 7.0 For questions or updates, please contact [facility] Please consult [email or web address] for contact info under        Signed, Perlie Gold, PA-C   I have personally seen and examined this patient. I agree with the assessment and plan as outlined above.  Rate is in the low 100s. Will increase Cardizem. Eliquis on hold for procedure tomorrow. IV heparin today  Verne Carrow, MD, [facility] 02/28/2023 10:03 AM

## 2023-02-28 NOTE — Progress Notes (Signed)
PT Cancellation Note  Patient Details Name: Caryssa Comas MRN: [id number] DOB: March 17, 1961   Cancelled Treatment:    Reason Eval/Treat Not Completed: Other (comment) (Refused PT due to muscle spasms. States she walked to bathroom earlier and has been having muscle spasms since. Pt  gets up  to bathroom with use of rollator during day. Pt to have kyphoplasty tomorrow.   Will check back later date per pt request.)   Bevelyn Buckles 02/28/2023, 9:09 AM Latash Nouri M,PT Acute Rehab Services [phone]

## 2023-02-28 NOTE — Progress Notes (Addendum)
PHARMACY - ANTICOAGULATION CONSULT NOTE  Pharmacy Consult for heparin Indication: atrial fibrillation  Allergies  Allergen Reactions   Codeine Other (See Comments)    Unknown reaction   Crestor [Rosuvastatin] Other (See Comments)    Myalgias  Weakness    Patient Measurements: Height: 5' 10.51" (179.1 cm) Weight: (!) 143.1 kg (315 lb 6.4 oz) IBW/kg (Calculated) : 69.68 Heparin Dosing Weight: 104  Vital Signs: Temp: 97.6 F (36.4 C) (11/21 0359) Temp Source: Oral (11/21 0359) BP: 125/97 (11/21 0359) Pulse Rate: 101 (11/21 0359)  Labs: Recent Labs    02/26/23 0922 02/27/23 0603 02/28/23 0441  HGB 14.6 14.0 13.6  HCT 46.8* 44.8 43.3  PLT 287 256 260  CREATININE 0.68 0.73 0.77    Estimated Creatinine Clearance: 111.1 mL/min (by C-G formula based on SCr of 0.77 mg/dL).   Medical History: Past Medical History:  Diagnosis Date   Atrial fibrillation (HCC)    Celiac artery dissection (HCC)    Diabetes mellitus (HCC)    History of DVT (deep vein thrombosis) 2010   Hyperlipidemia    Hypertension    Morbid obesity (HCC)       Assessment: 63 yoF admitted with AF started on apixaban. Pt with ongoing pain and known L1 compression so apixaban held and IR consulted for possible vertebroplasty. Pharmacy to dose IV heparin in meantime. Last apixaban dose was 11/20 am - will give small bolus to start given weight and monitor via aPTTs.   Goal of Therapy:  Heparin level 0.3-0.7 units/ml aPTT 66-102 seconds Monitor platelets by anticoagulation protocol: Yes   Plan:  Heparin 2000 units x1 then 1500 units/h Check heparin level in 6h  Fredonia Highland, PharmD, [place], [facility] Clinical Pharmacist [phone] Please check AMION for all [facility] Pharmacy numbers 02/28/2023

## 2023-03-01 DIAGNOSIS — I4891 Unspecified atrial fibrillation: Secondary | ICD-10-CM | POA: Diagnosis not present

## 2023-03-01 DIAGNOSIS — D72825 Bandemia: Secondary | ICD-10-CM | POA: Diagnosis not present

## 2023-03-01 DIAGNOSIS — M549 Dorsalgia, unspecified: Secondary | ICD-10-CM | POA: Diagnosis not present

## 2023-03-01 DIAGNOSIS — E876 Hypokalemia: Secondary | ICD-10-CM | POA: Diagnosis not present

## 2023-03-01 LAB — CBC
HCT: 44.8 % (ref 36.0–46.0)
Hemoglobin: 14.3 g/dL (ref 12.0–15.0)
MCH: 28.2 pg (ref 26.0–34.0)
MCHC: 31.9 g/dL (ref 30.0–36.0)
MCV: 88.4 fL (ref 80.0–100.0)
Platelets: 273 10*3/uL (ref 150–400)
RBC: 5.07 MIL/uL (ref 3.87–5.11)
RDW: 14.3 % (ref 11.5–15.5)
WBC: 10.4 10*3/uL (ref 4.0–10.5)
nRBC: 0 % (ref 0.0–0.2)

## 2023-03-01 LAB — GLUCOSE, CAPILLARY
Glucose-Capillary: 117 mg/dL — ABNORMAL HIGH (ref 70–99)
Glucose-Capillary: 130 mg/dL — ABNORMAL HIGH (ref 70–99)
Glucose-Capillary: 98 mg/dL (ref 70–99)
Glucose-Capillary: 144 mg/dL — ABNORMAL HIGH (ref 70–99)

## 2023-03-01 LAB — BASIC METABOLIC PANEL
Anion gap: 11 (ref 5–15)
BUN: 6 mg/dL — ABNORMAL LOW (ref 8–23)
CO2: 23 mmol/L (ref 22–32)
Calcium: 8.8 mg/dL — ABNORMAL LOW (ref 8.9–10.3)
Chloride: 102 mmol/L (ref 98–111)
Creatinine, Ser: 0.76 mg/dL (ref 0.44–1.00)
GFR, Estimated: >60 mL/min (ref >60)
Glucose, Bld: 107 mg/dL — ABNORMAL HIGH (ref 70–99)
Potassium: 4.1 mmol/L (ref 3.5–5.1)
Sodium: 136 mmol/L (ref 135–145)

## 2023-03-01 LAB — PROTIME-INR
INR: 1.1 (ref 0.8–1.2)
Prothrombin Time: 14.7 s (ref 11.4–15.2)

## 2023-03-01 LAB — HEPARIN LEVEL (UNFRACTIONATED): Heparin Unfractionated: 0.85 [IU]/mL — ABNORMAL HIGH (ref 0.30–0.70)

## 2023-03-01 LAB — MAGNESIUM: Magnesium: 1.7 mg/dL (ref 1.7–2.4)

## 2023-03-01 LAB — APTT: aPTT: 96 s — ABNORMAL HIGH (ref 24–36)

## 2023-03-01 MED ORDER — MAGNESIUM SULFATE 2 GM/50ML IV SOLN
2.0000 g | Freq: Once | INTRAVENOUS | Status: AC
  Administered 2023-03-01: 2 g via INTRAVENOUS
  Filled 2023-03-01: qty 50

## 2023-03-01 MED ORDER — DILTIAZEM HCL ER COATED BEADS 180 MG PO CP24
300.0000 mg | ORAL_CAPSULE | Freq: Every day | ORAL | Status: DC
  Administered 2023-03-01 – 2023-03-04 (×4): 300 mg via ORAL
  Filled 2023-03-01 (×4): qty 1

## 2023-03-01 NOTE — Progress Notes (Addendum)
Physical Therapy Treatment Patient Details Name: Tracie [name] MRN: [id number] DOB: 26-May-1960 Today's Date: 03/01/2023   History of Present Illness The pt is a 62 yo female presenting 11/17 for back spasms with pain down her R leg since 11/3. Pt found to be in afib with RVR and x-ray showed age-indeterminate L1 compression fx and possible metestatic dx in spine which is in process of being worked up. PMH includes: HTN, HLD, DM II, afib, DVT, myalgias, and morbid obesity (BMI >40).    PT Comments  Patient up to toilet in bathroom and able to ambulate in hallway with min A throughout.  Spouse present and very attentive and able to assist.  She hopes now to have procedure for kypho/vertebroplasty early next week.  HR up to 130's with ambulation today.  Feel she should be able to transition home with family support and HHPT.  PT will continue to follow.     If plan is discharge home, recommend the following: A little help with walking and/or transfers;Assistance with cooking/housework;Help with stairs or ramp for entrance;A little help with bathing/dressing/bathroom   Can travel by private vehicle        Equipment Recommendations       Recommendations for Other Services       Precautions / Restrictions Precautions Precautions: Fall;Back Precaution Comments: watch HR     Mobility  Bed Mobility Overal bed mobility: Needs Assistance Bed Mobility: Supine to Sit     Supine to sit: Supervision, HOB elevated, Used rails   Sit to sidelying: Min assist, Used rails General bed mobility comments: impulsive and sitting up from in bed with HOB up maximal amount, did practice with sit to sidelying for back precautions, assisted to lift R leg onto bed, then pt with pain attempting to roll back and wanted to sit up so stated could not tolerated HOB up till she lifts herself up first and in pain with spasms after lifting herself up with rail and spouse assist, RN aware need for pain  medication    Transfers Overall transfer level: Needs assistance Equipment used: Rollator (4 wheels) Transfers: Sit to/from Stand Sit to Stand: Min assist                Ambulation/Gait Ambulation/Gait assistance: Contact guard assist, Supervision Gait Distance (Feet): 70 Feet (&10') Assistive device: Rollator (4 wheels) Gait Pattern/deviations: Step-through pattern, Decreased stride length, Trunk flexed, Wide base of support       General Gait Details: ambulated to bathroom, then out to hallway after washing hands.   Stairs             Wheelchair Mobility     Tilt Bed    Modified Rankin (Stroke Patients Only)       Balance Overall balance assessment: Needs assistance Sitting-balance support: Feet supported Sitting balance-Leahy Scale: Good     Standing balance support: Bilateral upper extremity supported Standing balance-Leahy Scale: Poor                              Cognition Arousal: Alert Behavior During Therapy: Anxious Overall Cognitive Status: Within Functional Limits for tasks assessed                                          Exercises      General Comments General comments (skin integrity,  edema, etc.): declined to use back brace, stated MD said she did not need to use it if it increased her pain; states kyphoplasty put off due to lab values and plans for early next week.  Demonstrated hot pack, but pt declined as she could not control the heat amount even after demonstrating use of pillow case with one to two layers for comfort.      Pertinent Vitals/Pain Pain Assessment Faces Pain Scale: Hurts worst Pain Location: back spasms Pain Descriptors / Indicators: Spasm, Grimacing, Discomfort, Moaning, Crying Pain Intervention(s): Monitored during session, Repositioned, Utilized relaxation techniques, Patient requesting pain meds-RN notified (offerred heat)    Home Living                           Prior Function            PT Goals (current goals can now be found in the care plan section) Progress towards PT goals: Progressing toward goals    Frequency    Min 1X/week      PT Plan      Co-evaluation              AM-PAC PT "6 Clicks" Mobility   Outcome Measure  Help needed turning from your back to your side while in a flat bed without using bedrails?: A Little Help needed moving from lying on your back to sitting on the side of a flat bed without using bedrails?: A Little Help needed moving to and from a bed to a chair (including a wheelchair)?: A Little Help needed standing up from a chair using your arms (e.g., wheelchair or bedside chair)?: A Little Help needed to walk in hospital room?: A Little Help needed climbing 3-5 steps with a railing? : Total 6 Click Score: 16    End of Session   Activity Tolerance: Patient limited by pain Patient left: in bed;with call bell/phone within reach;with family/visitor present   PT Visit Diagnosis: Pain;Other abnormalities of gait and mobility (R26.89);Muscle weakness (generalized) (M62.81) Pain - part of body:  (back)     Time: 1430-1455 PT Time Calculation (min) (ACUTE ONLY): 25 min  Charges:    $Gait Training: 8-22 mins $Therapeutic Activity: 8-22 mins PT General Charges $$ ACUTE PT VISIT: 1 Visit                     Sheran Lawless, PT Acute Rehabilitation Services Office:[phone] 03/01/2023    Elray Mcgregor 03/01/2023, 4:43 PM

## 2023-03-01 NOTE — Plan of Care (Signed)
  Problem: Education: Goal: Knowledge of General Education information will improve Description Including pain rating scale, medication(s)/side effects and non-pharmacologic comfort measures Outcome: Progressing   

## 2023-03-01 NOTE — Progress Notes (Signed)
PHARMACY - ANTICOAGULATION CONSULT NOTE  Pharmacy Consult for heparin Indication: atrial fibrillation  Allergies  Allergen Reactions   Codeine Other (See Comments)    Unknown reaction   Crestor [Rosuvastatin] Other (See Comments)    Myalgias  Weakness    Patient Measurements: Height: 5' 10.51" (179.1 cm) Weight: (!) 141.4 kg (311 lb 11.2 oz) IBW/kg (Calculated) : 69.68 Heparin Dosing Weight: 104  Vital Signs: Temp: 97.8 F (36.6 C) (11/22 0400) Temp Source: Oral (11/22 0400) BP: 140/89 (11/22 0400) Pulse Rate: 92 (11/22 0400)  Labs: Recent Labs    02/27/23 0603 02/28/23 0441 02/28/23 0920 02/28/23 1526 03/01/23 0610  HGB 14.0 13.6  --   --  14.3  HCT 44.8 43.3  --   --  44.8  PLT 256 260  --   --  273  APTT  --   --   --  68* 96*  LABPROT  --   --   --   --  14.7  INR  --   --   --   --  1.1  HEPARINUNFRC  --   --   --   --  0.85*  CREATININE 0.73 0.77 0.64  --   --     Estimated Creatinine Clearance: 110.4 mL/min (by C-G formula based on SCr of 0.64 mg/dL).   Medical History: Past Medical History:  Diagnosis Date   Atrial fibrillation (HCC)    Celiac artery dissection (HCC)    Diabetes mellitus (HCC)    History of DVT (deep vein thrombosis) 2010   Hyperlipidemia    Hypertension    Morbid obesity (HCC)       Assessment: 15 yoF admitted with AF started on apixaban. Pt with ongoing pain and known L1 compression so apixaban held and IR consulted for possible vertebroplasty. Pharmacy to dose IV heparin in meantime. Last apixaban dose was 11/20 am - will give small bolus to start given weight and monitor via aPTTs.  aPTT is therapeutic, heparin level falsely elevated by recent DOAC use. Planning vertebroplasty with IR today - will need to follow-up heparin plans postop.   Goal of Therapy:  Heparin level 0.3-0.7 units/ml aPTT 66-102 seconds Monitor platelets by anticoagulation protocol: Yes   Plan:  Continue heparin 1500 units/h F/U [facility] plans  postop   Fredonia Highland, PharmD, BCPS, [facility] Clinical Pharmacist [phone] Please check AMION for all [facility] Pharmacy numbers 03/01/2023

## 2023-03-01 NOTE — Plan of Care (Signed)
IR was requested for L1 KP.   The procedure was tentatively scheduled for today, however, NIR was not able to accommodate the procedure today due to urgent cases.   Team notified.   The procedure is tentatively scheduled for Monday pending IR schedule.  Please call IR for questions and concerns.   PLAN - NPO except meds  Monday MN - IR will call the floor and inform when heparin needs to be stopped    Norwin Aleman H Mukund Weinreb PA-C 03/01/2023 9:48 AM

## 2023-03-01 NOTE — Progress Notes (Signed)
[facility] PROGRESS NOTE    Progress Note  Tracie [name]  EAV:[id number] DOB: 19-Apr-1960 DOA: 02/24/2023 PCP: Aliene Beams, MD     Brief Narrative:   Tracie [name] is an 62 y.o. female past medical history of hypertension diabetes mellitus type 2, chronic atrial fibrillation and history of DVT on apixaban, Tracie [name] seen in the ED in 02/21/2023 for back pain diagnosed with sciatica sent home with medications, saw her PCP but was given muscle relaxer and is steroid taper comes to the ED complaining of back pain was found to be in A-fib with RVR.  She says she was getting better but the pain came back worst no neurological symptoms. 2D echo was done that showed an EF of 60% no wall motion abnormality.  Assessment/Plan:   Atrial fibrillation with RVR (HCC): Now on diltiazem 240 and metoprolol 50 mg for rate control. Chads Vascor greater than 4. Cardiology was consulted they will focus on HR control strategy, will need a sleep study as an outpatient. Try to keep potassium greater than 4 magnesium greater than 2. Use IV metoprolol as needed. She is not a good candidate for warfarin.  TOC consulted for anticoagulation. Holding eliquis for procedure. Currently NPO.  Lumbar pain/L1 compression fracture/osteoporosis: Repeat MRI with contrast showed body lesions are not enhancing or likely to be benign. Her pain is minimally controlled with IV hydromorphone and Flexeril. IR was consulted for possible vertebroplasty scheduled on 03/01/2023 after Eliquis wears off. Started on a bowel regimen. Now on IV heparin hold 4 hours prior to procedure.  Osteoporosis: Follow-up PCP as an outpatient will need to be on calcium, vitamin D and biphosphonate's  Hypokalemia/hypomagnesemia: Basic metabolic panels pending. Try to greater than 4 magnesium greater than 2.  Leukocytosis: Likely due to steroids she has remained afebrile. On steroids for 2 more days.  Controlled diabetes  mellitus type 2: Resumed statin Jardiance. Continue long-acting insulin plus sliding scale CBGs ACH S, A1c of 7.0. Blood glucose well-controlled.  Essential hypertension: Currently on Cardizem, losartan and metoprolol for blood pressure still elevated.  Hyperlipidemia: Continue statins.  Lower extremity edema/mild RV dysfunction: Suspect multifactorial in the setting of obesity A-fib, probably undiagnosed such as sleep apnea. Cardiology recommended sleep study as an outpatient.    DVT prophylaxis: Eliquis Family Communication:none Status is: Observation The patient remains OBS appropriate and will d/c before 2 midnights.    Code Status:     Code Status Orders  (From admission, onward)           Start     Ordered   02/24/23 0737  Full code  Continuous       Question:  By:  Answer:  Consent: discussion documented in EHR   02/24/23 0739           Code Status History     This patient has a current code status but no historical code status.         IV Access:   Peripheral IV   Procedures and diagnostic studies:   No results found.   Medical Consultants:   None.   Subjective:    Tracie [name] relates her pain is improved had a bowel movement.  Objective:    Vitals:   02/28/23 2001 02/28/23 2122 03/01/23 0020 03/01/23 0400  BP: (!) 154/117 (!) 150/100 (!) 150/92 (!) 140/89  Pulse: 96 95 73 92  Resp: 19  15 19   Temp: 97.6 F (36.4 C)   97.8 F (36.6 C)  TempSrc: Oral   Oral  SpO2: 94%  93% 92%  Weight:    (!) 141.4 kg  Height:       SpO2: 92 %   Intake/Output Summary (Last 24 hours) at 03/01/2023 0742 Last data filed at 02/28/2023 1300 Gross per 24 hour  Intake 359.92 ml  Output --  Net 359.92 ml    Filed Weights   02/27/23 0328 02/28/23 0359 03/01/23 0400  Weight: (!) 139.2 kg (!) 143.1 kg (!) 141.4 kg    Exam: General exam: In no acute distress. Respiratory system: Good air movement and clear to  auscultation. Cardiovascular system: S1 & S2 heard, RRR. No JVD. Gastrointestinal system: Abdomen is nondistended, soft and nontender.  Extremities: No pedal edema. Skin: No rashes, lesions or ulcers Psychiatry: Judgement and insight appear normal. Mood & affect appropriate. Data Reviewed:    Labs: Basic Metabolic Panel: Recent Labs  Lab 02/24/23 0748 02/25/23 0112 02/26/23 1610 02/27/23 0603 02/28/23 0441 02/28/23 0920 03/01/23 0610  NA  --  138 135 136 137 135  --   K  --  3.2* 4.2 3.6 3.6 3.3*  --   CL  --  106 102 102 103 102  --   CO2  --  25 23 26 26 25   --   GLUCOSE  --  175* 121* 111* 100* 126*  --   BUN  --  12 12 10 8 8   --   CREATININE  --  0.70 0.68 0.73 0.77 0.64  --   CALCIUM  --  8.7* 8.5* 8.6* 8.5* 8.6*  --   MG 1.6*  --  1.7 1.5* 1.7  --  1.7  PHOS  --   --   --   --   --  3.4  --    GFR Estimated Creatinine Clearance: 110.4 mL/min (by C-G formula based on SCr of 0.64 mg/dL). Liver Function Tests: Recent Labs  Lab 02/28/23 0920  ALBUMIN 2.9*   No results for input(s): "LIPASE", "AMYLASE" in the last 168 hours. No results for input(s): "AMMONIA" in the last 168 hours. Coagulation profile Recent Labs  Lab 03/01/23 0610  INR 1.1   COVID-19 Labs  No results for input(s): "DDIMER", "FERRITIN", "LDH", "CRP" in the last 72 hours.  No results found for: "SARSCOV2NAA"  CBC: Recent Labs  Lab 02/24/23 0548 02/25/23 0112 02/26/23 0922 02/27/23 0603 02/28/23 0441 03/01/23 0610  WBC 14.2* 13.7* 11.6* 12.0* 12.0* 10.4  NEUTROABS 11.6*  --   --   --   --   --   HGB 14.1 13.5 14.6 14.0 13.6 14.3  HCT 45.7 43.7 46.8* 44.8 43.3 44.8  MCV 89.4 90.1 89.7 88.4 87.5 88.4  PLT 316 291 287 256 260 273   Cardiac Enzymes: No results for input(s): "CKTOTAL", "CKMB", "CKMBINDEX", "TROPONINI" in the last 168 hours. BNP (last 3 results) No results for input(s): "PROBNP" in the last 8760 hours. CBG: Recent Labs  Lab 02/27/23 2154 02/28/23 0736  02/28/23 1145 02/28/23 1648 02/28/23 2014  GLUCAP 116* 108* 151* 115* 145*   D-Dimer: No results for input(s): "DDIMER" in the last 72 hours. Hgb A1c: No results for input(s): "HGBA1C" in the last 72 hours.  Lipid Profile: No results for input(s): "CHOL", "HDL", "LDLCALC", "TRIG", "CHOLHDL", "LDLDIRECT" in the last 72 hours.  Thyroid function studies: No results for input(s): "TSH", "T4TOTAL", "T3FREE", "THYROIDAB" in the last 72 hours.  Invalid input(s): "FREET3"  Anemia work up: No results for input(s): "VITAMINB12", "FOLATE", "FERRITIN", "  TIBC", "IRON", "RETICCTPCT" in the last 72 hours. Sepsis Labs: Recent Labs  Lab 02/26/23 0922 02/27/23 0603 02/28/23 0441 03/01/23 0610  WBC 11.6* 12.0* 12.0* 10.4   Microbiology No results found for this or any previous visit (from the past 240 hour(s)).   Medications:    diltiazem  240 mg Oral Daily   empagliflozin  25 mg Oral Daily   insulin aspart  0-15 Units Subcutaneous TID WC   insulin aspart  0-5 Units Subcutaneous QHS   insulin aspart  4 Units Subcutaneous TID WC   insulin detemir  5 Units Subcutaneous QHS   lidocaine  1 patch Transdermal Daily   losartan  100 mg Oral Daily   metoprolol succinate  50 mg Oral BID   pantoprazole  40 mg Oral BID   polyethylene glycol  17 g Oral BID   Continuous Infusions:   ceFAZolin (ANCEF) IV     heparin 1,500 Units/hr (02/28/23 2338)       LOS: 3 days   Marinda Elk  [facility]  03/01/2023, 7:42 AM

## 2023-03-01 NOTE — Progress Notes (Signed)
Mobility Specialist Progress Note:   03/01/23 1100  Mobility  Activity Ambulated with assistance to bathroom  Level of Assistance Minimal assist, patient does 75% or more  Assistive Device Front wheel walker  Distance Ambulated (ft) 20 ft (10+10)  Activity Response Tolerated well  Mobility Referral Yes  $Mobility charge 1 Mobility  Mobility Specialist Start Time (ACUTE ONLY) 1040  Mobility Specialist Stop Time (ACUTE ONLY) 1052  Mobility Specialist Time Calculation (min) (ACUTE ONLY) 12 min   Pt received in bed, requesting assistance to BR. Had x1 LOB in BR d/t rushing, but self corrected. Void successful. Pt left in bed with call bell and all needs met. Family present.  D'Vante Earlene Plater Mobility Specialist Please contact via Special educational needs teacher or Rehab office at [phone]

## 2023-03-01 NOTE — Progress Notes (Addendum)
Patient Name: Tracie [name] Date of Encounter: 03/01/2023  [facility] Cardiologist: Parke Poisson, MD   Interval Summary  .    Patient feeling well this morning. Does not have pain today. Denies chest discomfort, palpitations, shortness of breath.   Vital Signs .    Vitals:   02/28/23 2001 02/28/23 2122 03/01/23 0020 03/01/23 0400  BP: (!) 154/117 (!) 150/100 (!) 150/92 (!) 140/89  Pulse: 96 95 73 92  Resp: 19  15 19   Temp: 97.6 F (36.4 C)   97.8 F (36.6 C)  TempSrc: Oral   Oral  SpO2: 94%  93% 92%  Weight:    (!) 141.4 kg  Height:        Intake/Output Summary (Last 24 hours) at 03/01/2023 0745 Last data filed at 02/28/2023 1300 Gross per 24 hour  Intake 359.92 ml  Output --  Net 359.92 ml      03/01/2023    4:00 AM 02/28/2023    3:59 AM 02/27/2023    3:28 AM  Last 3 Weights  Weight (lbs) 311 lb 11.2 oz 315 lb 6.4 oz 306 lb 12.8 oz  Weight (kg) 141.386 kg 143.065 kg 139.164 kg      Telemetry/ECG    Persistent afib with ventricular rates averaging just over 100bpm. Isolated RVR into the 120s-130s - Personally Reviewed  Physical Exam .   GEN: No acute distress.   Neck: No JVD Cardiac: Irregularly irregular, no murmurs, rubs, or gallops.  Respiratory: Clear to auscultation bilaterally. GI: Soft, nontender, non-distended  MS: trace non-pitting edema  Assessment & Plan .     62 y.o. 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 David Stall.    Back pain, presenting complaint -- MRI with compression fracture and concern for possible metastatic disease, follow up MRI with contrast with benign lesions -- f/u MRI in 3-6 months -- Vertebroplasty with IR today.   Persistent atrial fibrillation  with RVR -- diagnosed 2021, patient declined further workup or management at that time, unclear if she has remained out of rhythm -- HR was 70s-90s during ED visit earlier this month, here 120s on arrival this admission  -- metoprolol succinate was increased to 50mg  BID and Diltiazem 120mg  daily added. HR still elevated yesterday morning, Diltiazem increased to 180mg  daily with mild improvement. Further increase to 240mg  on 11/21. Titrate to 300mg  today. May need DCCV for definitive rate control.  -- Eliquis 5mg  BID stopped by primary team 11/20 due to possible vertebroplasty (now planned for today). Will need to bridge with heparin. She has patient assistance forms for Eliquis (husband was taking them to PCP office) -- focus is rate control at this time, she is not currently interesting in DCCV but also need to ensure she can remain on [facility]  -- consider outpatient sleep study if she is willing   Lower extremity edema, dyspnea, mild RV dysfunction -- suspect multifactorial from obesity, persistent atrial fib, cannot exclude contribution from undiagnosed OSA and component of acute diastolic HF though habitus makes it challenging to definitively say (BNP can be falsely low in this BMI) -- patient reports edema actually better    HTN  -- continue metoprolol 50mg  BID, Diltiazem now up to 240mg  today with ongoing RVR with stable  BP.  -- also on losartan, Jardiance   Hypomagnesemia/hypokalemia -- K+ pending, Mag 1.7 (Goal K>4, Mg>2) -- supplement ordered   DM -- Hgb A1c 7.0 For questions or updates, please contact [facility] Please consult [email or web address] for contact info under        Signed, Perlie Gold, PA-C   I have personally seen and examined this patient. I agree with the assessment and plan as outlined above.  Rate is better controlled today, 90s currently. We will increase her Cardizem to 300 mg daily. No other cardiology recs today.   Verne Carrow, MD,  [facility] 03/01/2023 9:53 AM

## 2023-03-02 DIAGNOSIS — I4891 Unspecified atrial fibrillation: Secondary | ICD-10-CM | POA: Diagnosis not present

## 2023-03-02 LAB — GLUCOSE, CAPILLARY
Glucose-Capillary: 123 mg/dL — ABNORMAL HIGH (ref 70–99)
Glucose-Capillary: 78 mg/dL (ref 70–99)
Glucose-Capillary: 129 mg/dL — ABNORMAL HIGH (ref 70–99)
Glucose-Capillary: 123 mg/dL — ABNORMAL HIGH (ref 70–99)

## 2023-03-02 LAB — CBC
HCT: 44.5 % (ref 36.0–46.0)
Hemoglobin: 14 g/dL (ref 12.0–15.0)
MCH: 28.1 pg (ref 26.0–34.0)
MCHC: 31.5 g/dL (ref 30.0–36.0)
MCV: 89.2 fL (ref 80.0–100.0)
Platelets: 250 10*3/uL (ref 150–400)
RBC: 4.99 MIL/uL (ref 3.87–5.11)
RDW: 14.1 % (ref 11.5–15.5)
WBC: 8.9 10*3/uL (ref 4.0–10.5)
nRBC: 0 % (ref 0.0–0.2)

## 2023-03-02 LAB — APTT
aPTT: 118 s — ABNORMAL HIGH (ref 24–36)
aPTT: 113 s — ABNORMAL HIGH (ref 24–36)
aPTT: 98 s — ABNORMAL HIGH (ref 24–36)

## 2023-03-02 LAB — BASIC METABOLIC PANEL
Anion gap: 12 (ref 5–15)
BUN: 8 mg/dL (ref 8–23)
CO2: 20 mmol/L — ABNORMAL LOW (ref 22–32)
Calcium: 8.6 mg/dL — ABNORMAL LOW (ref 8.9–10.3)
Chloride: 102 mmol/L (ref 98–111)
Creatinine, Ser: 0.56 mg/dL (ref 0.44–1.00)
GFR, Estimated: >60 mL/min (ref >60)
Glucose, Bld: 125 mg/dL — ABNORMAL HIGH (ref 70–99)
Potassium: 3.5 mmol/L (ref 3.5–5.1)
Sodium: 134 mmol/L — ABNORMAL LOW (ref 135–145)

## 2023-03-02 LAB — HEPARIN LEVEL (UNFRACTIONATED): Heparin Unfractionated: 0.81 [IU]/mL — ABNORMAL HIGH (ref 0.30–0.70)

## 2023-03-02 MED ORDER — CELECOXIB 100 MG PO CAPS
100.0000 mg | ORAL_CAPSULE | Freq: Every day | ORAL | Status: DC
  Administered 2023-03-02 – 2023-03-05 (×4): 100 mg via ORAL
  Filled 2023-03-02 (×4): qty 1

## 2023-03-02 MED ORDER — METOPROLOL SUCCINATE ER 100 MG PO TB24
100.0000 mg | ORAL_TABLET | Freq: Two times a day (BID) | ORAL | Status: DC
  Administered 2023-03-02 – 2023-03-05 (×6): 100 mg via ORAL
  Filled 2023-03-02 (×6): qty 1

## 2023-03-02 NOTE — Progress Notes (Signed)
PHARMACY - ANTICOAGULATION CONSULT NOTE  Pharmacy Consult for heparin Indication: atrial fibrillation  Labs: Recent Labs    02/28/23 0441 02/28/23 0920 02/28/23 1526 03/01/23 0610 03/01/23 0906 03/02/23 0328  HGB 13.6  --   --  14.3  --  14.0  HCT 43.3  --   --  44.8  --  44.5  PLT 260  --   --  273  --  250  APTT  --   --  68* 96*  --  118*  LABPROT  --   --   --  14.7  --   --   INR  --   --   --  1.1  --   --   HEPARINUNFRC  --   --   --  0.85*  --  0.81*  CREATININE 0.77 0.64  --   --  0.76 0.56   Assessment: 62yo female supratherapeutic on heparin after two PTTs at goal though had been trending up; no infusion issues or signs of bleeding per RN.  Goal of Therapy:  aPTT 66-102 seconds   Plan:  Decrease heparin infusion by 1 units/kgABW/hr to 1400 units/hr. Check PTT in 6 hours.   Vernard Gambles, PharmD, BCPS 03/02/2023 5:18 AM

## 2023-03-02 NOTE — Progress Notes (Signed)
   Patient Name: Tracie [name] Date of Encounter: 03/02/2023 [facility] Cardiologist: Parke Poisson, MD   Interval Summary  .    Still with back pain.  Atrial fibrillation slightly better controlled at rest.  This is on metoprolol 50 succinate twice daily and diltiazem 300 mg a day CD    Vital Signs .    Vitals:   03/01/23 2109 03/02/23 0242 03/02/23 0729 03/02/23 0944  BP: (!) 150/96 130/76  123/79  Pulse: 96 73  100  Resp:  20    Temp:  98.1 F (36.7 C)    TempSrc:  Oral    SpO2:      Weight:   (!) 142.1 kg   Height:        Intake/Output Summary (Last 24 hours) at 03/02/2023 1124 Last data filed at 03/01/2023 1840 Gross per 24 hour  Intake 369.91 ml  Output --  Net 369.91 ml      03/02/2023    7:29 AM 03/01/2023    4:00 AM 02/28/2023    3:59 AM  Last 3 Weights  Weight (lbs) 313 lb 4.4 oz 311 lb 11.2 oz 315 lb 6.4 oz  Weight (kg) 142.1 kg 141.386 kg 143.065 kg      Telemetry/ECG    101 at rest, 150 during activity - Personally Reviewed  Physical Exam .   GEN: No acute distress.   Neck: No JVD Cardiac: IRRR, no murmurs, rubs, or gallops.  Respiratory: Clear to auscultation bilaterally. GI: Soft, nontender, non-distended  MS: Trace edema  Assessment & Plan .     62 year old with persistent atrial fibrillation with rapid ventricular response hypertension hyperlipidemia diabetes celiac artery dissection in 2010 treated with Coumadin morbid obesity mild RV dysfunction  Persistent atrial fibrillation with RVR - Previously diagnosed in 2021 heart rates earlier this month were between 70 and 90 here 120 during this admission we increased diltiazem from 180 mg up to 300 mg.  Lets also today increase the metoprolol succinate to 100 mg twice daily.  She does not tolerate the tartrate she states.  Chronic anticoagulation - Eliquis 5 mg twice daily was stopped for potential vertebroplasty.  She has had difficulty obtaining Eliquis due to cost.   Not interested at this point in DC cardioversion.  Lower extremity edema - Obesity contributing as well as possible undiagnosed obstructive sleep apnea and component of diastolic heart failure perhaps.  Hypertension - On metoprolol 50 mg twice daily, diltiazem 300, losartan  Diabetes type 2 - Hemoglobin A1c 7.0-Jardiance  Back pain persistent - Vertebral compression fracture, vertebroplasty with interventional radiology attempted Friday but will be postponed for Monday, continue with IV heparin . For questions or updates, please contact [facility] Please consult [email or web address] for contact info under        Signed, Donato Schultz, MD

## 2023-03-02 NOTE — Progress Notes (Signed)
PROGRESS NOTE    Tracie [name]  WUJ:[id number] DOB: 1960/11/15 DOA: 02/24/2023 PCP: Aliene Beams, MD   Brief Narrative:  Tracie [name] is an 62 y.o. female past medical history of hypertension diabetes mellitus type 2, chronic atrial fibrillation and history of DVT on apixaban, [name] seen in the ED in 02/21/2023 for back pain diagnosed with sciatica sent home with medications, saw her PCP but was given muscle relaxer and is steroid taper comes to the ED complaining of back pain was found to be in A-fib with RVR.  She says she was getting better but the pain came back worst no neurological symptoms. 2D echo was done that showed an EF of 60% no wall motion abnormality.  Assessment & Plan:   Principal Problem:   Atrial fibrillation with rapid ventricular response (HCC) Active Problems:   Lumbar pain   Hypokalemia   Leukocytosis   Controlled type 2 diabetes mellitus without complication, without long-term current use of insulin (HCC)   Essential hypertension, benign   Hyperlipidemia   Atrial fibrillation with RVR (HCC): Now on diltiazem 240 and metoprolol 50 mg for rate control. Chads Vascor greater than 4. Cardiology was consulted they will focus on HR control strategy, will need a sleep study as an outpatient. Try to keep potassium greater than 4 magnesium greater than 2. Use IV metoprolol as needed. She is not a good candidate for warfarin.  TOC consulted for anticoagulation. Holding eliquis for procedure. Currently NPO.  Lumbar pain/L1 compression fracture/osteoporosis: Repeat MRI with contrast showed body lesions are not enhancing or likely to be benign. Her pain is minimally controlled with IV hydromorphone and Flexeril. IR following - possible vertebroplasty 03/04/2023 Now on IV heparin   Osteoporosis: Follow-up PCP as an outpatient will need to be on calcium, vitamin D and biphosphonate's   Hypokalemia/hypomagnesemia: Stable   Leukocytosis: Likely  reactive due to steroids Remains afebrile - no signs/symptoms of infection   Controlled diabetes mellitus type 2: Resumed statin Jardiance. Continue long-acting insulin plus sliding scale CBGs ACH S, A1c of 7.0. Blood glucose well-controlled.   Essential hypertension: Currently on Cardizem, losartan and metoprolol for blood pressure still elevated.   Hyperlipidemia: Continue statins.   Lower extremity edema/mild RV dysfunction: Suspect multifactorial in the setting of obesity A-fib, probably undiagnosed such as sleep apnea. Cardiology recommended sleep study as an outpatient.  DVT prophylaxis:    Code Status:   Code Status: Full Code  Family Communication: At bedside  Status is: Inpatient  Dispo: The patient is from: Home              Anticipated d/c is to: Home              Anticipated d/c date is: 63 - 72hours              Patient currently not medically stable for discharge  Consultants:  Cardiology, interventional radiology  Procedures:  Kyphoplasty tentatively planned 11/25  Antimicrobials:  None  Subjective: No acute issues or events overnight  Objective: Vitals:   03/01/23 1932 03/01/23 2109 03/02/23 0242 03/02/23 0729  BP: (!) 134/104 (!) 150/96 130/76   Pulse: 99 96 73   Resp: (!) 22  20   Temp: 98.1 F (36.7 C)  98.1 F (36.7 C)   TempSrc: Oral  Oral   SpO2:      Weight:    (!) 142.1 kg  Height:        Intake/Output Summary (Last 24 hours) at 03/02/2023 [phone]  Last data filed at 03/01/2023 1840 Gross per 24 hour  Intake 671.6 ml  Output --  Net 671.6 ml   Filed Weights   02/28/23 0359 03/01/23 0400 03/02/23 0729  Weight: (!) 143.1 kg (!) 141.4 kg (!) 142.1 kg    Examination:  General:  Pleasantly resting in bed, No acute distress. HEENT:  Normocephalic atraumatic.  Sclerae nonicteric, noninjected.  Extraocular movements intact bilaterally. Neck:  Without mass or deformity.  Trachea is midline. Lungs:  Clear to auscultate bilaterally  without rhonchi, wheeze, or rales. Heart:  Regular rate and rhythm.  Without murmurs, rubs, or gallops. Abdomen:  Soft, nontender, nondistended.  Without guarding or rebound. Extremities: Without cyanosis, clubbing, edema, or obvious deformity. Skin:  Warm and dry, no erythema.   Data Reviewed: I have personally reviewed following labs and imaging studies  CBC: Recent Labs  Lab 02/24/23 0548 02/25/23 0112 02/26/23 0922 02/27/23 0603 02/28/23 0441 03/01/23 0610 03/02/23 0328  WBC 14.2*   < > 11.6* 12.0* 12.0* 10.4 8.9  NEUTROABS 11.6*  --   --   --   --   --   --   HGB 14.1   < > 14.6 14.0 13.6 14.3 14.0  HCT 45.7   < > 46.8* 44.8 43.3 44.8 44.5  MCV 89.4   < > 89.7 88.4 87.5 88.4 89.2  PLT 316   < > 287 256 260 273 250   < > = values in this interval not displayed.   Basic Metabolic Panel: Recent Labs  Lab 02/24/23 0748 02/25/23 0112 02/26/23 0922 02/27/23 0603 02/28/23 0441 02/28/23 0920 03/01/23 0610 03/01/23 0906 03/02/23 0328  NA  --    < > 135 136 137 135  --  136 134*  K  --    < > 4.2 3.6 3.6 3.3*  --  4.1 3.5  CL  --    < > 102 102 103 102  --  102 102  CO2  --    < > 23 26 26 25   --  23 20*  GLUCOSE  --    < > 121* 111* 100* 126*  --  107* 125*  BUN  --    < > 12 10 8 8   --  6* 8  CREATININE  --    < > 0.68 0.73 0.77 0.64  --  0.76 0.56  CALCIUM  --    < > 8.5* 8.6* 8.5* 8.6*  --  8.8* 8.6*  MG 1.6*  --  1.7 1.5* 1.7  --  1.7  --   --   PHOS  --   --   --   --   --  3.4  --   --   --    < > = values in this interval not displayed.   GFR: Estimated Creatinine Clearance: 110.7 mL/min (by C-G formula based on SCr of 0.56 mg/dL). Liver Function Tests: Recent Labs  Lab 02/28/23 0920  ALBUMIN 2.9*   No results for input(s): "LIPASE", "AMYLASE" in the last 168 hours. No results for input(s): "AMMONIA" in the last 168 hours. Coagulation Profile: Recent Labs  Lab 03/01/23 0610  INR 1.1   Cardiac Enzymes: No results for input(s): "CKTOTAL", "CKMB",  "CKMBINDEX", "TROPONINI" in the last 168 hours. BNP (last 3 results) No results for input(s): "PROBNP" in the last 8760 hours. HbA1C: No results for input(s): "HGBA1C" in the last 72 hours. CBG: Recent Labs  Lab 03/01/23 0755 03/01/23 1157 03/01/23 1631  03/01/23 1948 03/02/23 0800  GLUCAP 117* 130* 98 144* 123*   Lipid Profile: No results for input(s): "CHOL", "HDL", "LDLCALC", "TRIG", "CHOLHDL", "LDLDIRECT" in the last 72 hours. Thyroid Function Tests: No results for input(s): "TSH", "T4TOTAL", "FREET4", "T3FREE", "THYROIDAB" in the last 72 hours. Anemia Panel: No results for input(s): "VITAMINB12", "FOLATE", "FERRITIN", "TIBC", "IRON", "RETICCTPCT" in the last 72 hours. Sepsis Labs: No results for input(s): "PROCALCITON", "LATICACIDVEN" in the last 168 hours.  No results found for this or any previous visit (from the past 240 hour(s)).       Radiology Studies: No results found.      Scheduled Meds:  celecoxib  100 mg Oral Daily   diltiazem  300 mg Oral Daily   empagliflozin  25 mg Oral Daily   insulin aspart  0-15 Units Subcutaneous TID WC   insulin aspart  0-5 Units Subcutaneous QHS   insulin aspart  4 Units Subcutaneous TID WC   insulin detemir  5 Units Subcutaneous QHS   lidocaine  1 patch Transdermal Daily   losartan  100 mg Oral Daily   metoprolol succinate  50 mg Oral BID   pantoprazole  40 mg Oral BID   polyethylene glycol  17 g Oral BID   Continuous Infusions:  heparin 1,400 Units/hr (03/02/23 0543)     LOS: 4 days   Time spent:  [name], DO [facility]  If 7PM-7AM, please contact night-coverage [email or web address]  03/02/2023, 8:34 AM

## 2023-03-02 NOTE — Progress Notes (Addendum)
PHARMACY - ANTICOAGULATION CONSULT NOTE  Pharmacy Consult for heparin Indication: atrial fibrillation  Allergies  Allergen Reactions   Codeine Other (See Comments)    Unknown reaction   Crestor [Rosuvastatin] Other (See Comments)    Myalgias  Weakness    Patient Measurements: Height: 5' 10.51" (179.1 cm) Weight: (!) 142.1 kg (313 lb 4.4 oz) IBW/kg (Calculated) : 69.68 Heparin Dosing Weight: 104  Vital Signs: Temp: 98 F (36.7 C) (11/23 1232) Temp Source: Oral (11/23 1232) BP: 154/73 (11/23 1232) Pulse Rate: 104 (11/23 1232)  Labs: Recent Labs    02/28/23 0441 02/28/23 0920 02/28/23 1526 03/01/23 0610 03/01/23 0906 03/02/23 0328 03/02/23 1136  HGB 13.6  --   --  14.3  --  14.0  --   HCT 43.3  --   --  44.8  --  44.5  --   PLT 260  --   --  273  --  250  --   APTT  --   --    < > 96*  --  118* 113*  LABPROT  --   --   --  14.7  --   --   --   INR  --   --   --  1.1  --   --   --   HEPARINUNFRC  --   --   --  0.85*  --  0.81*  --   CREATININE 0.77 0.64  --   --  0.76 0.56  --    < > = values in this interval not displayed.    Estimated Creatinine Clearance: 110.7 mL/min (by C-G formula based on SCr of 0.56 mg/dL).   Medical History: Past Medical History:  Diagnosis Date   Atrial fibrillation (HCC)    Celiac artery dissection (HCC)    Diabetes mellitus (HCC)    History of DVT (deep vein thrombosis) 2010   Hyperlipidemia    Hypertension    Morbid obesity (HCC)    Assessment: 42 yoF admitted with AF started on apixaban. Pt with ongoing pain and known L1 compression so apixaban held and IR consulted for possible vertebroplasty. Pharmacy to dose IV heparin in meantime. Last apixaban dose was 11/20 am - will give small bolus to start given weight and monitor via aPTTs.  Vertebroplasty with IR was unable to be completed on 11/22, but noted plans for procedure on Monday, 11/25.  -aPTT down to 98 on 1200 unitshr  Goal of Therapy:  Heparin level 0.3-0.7  units/ml aPTT 66-102 seconds Monitor platelets by anticoagulation protocol: Yes   Plan:  Continue heparin at 1200 units/hr Daily heparin level and CBC  Harland German, PharmD Clinical Pharmacist **Pharmacist phone directory can now be found on [email or web address] ([email or web address]).  Listed under [facility].

## 2023-03-02 NOTE — Progress Notes (Signed)
Mobility Specialist Progress Note:    03/02/23 1143  Mobility  Activity Ambulated with assistance in room;Ambulated with assistance to bathroom  Level of Assistance Minimal assist, patient does 75% or more  Assistive Device Front wheel walker  Distance Ambulated (ft) 20 ft  Activity Response Tolerated well  Mobility Referral Yes  $Mobility charge 1 Mobility  Mobility Specialist Start Time (ACUTE ONLY) 1105  Mobility Specialist Stop Time (ACUTE ONLY) 1120  Mobility Specialist Time Calculation (min) (ACUTE ONLY) 15 min   Pt received in bed agreeable to mobility. Needed no physical assistance w/ bed mobility and MinA for STS. C/o strong back pain throughout session. Unable to ambulate further d/t HR reaching 164 in the BR. Returned to bed w/o fault. HR returned to 99. Call bell and personal belongings in reach. Family in room. RN notified.  Thompson Grayer Mobility Specialist  Please contact vis Secure Chat or  Rehab Office [phone]

## 2023-03-03 DIAGNOSIS — I4891 Unspecified atrial fibrillation: Secondary | ICD-10-CM | POA: Diagnosis not present

## 2023-03-03 LAB — HEPARIN LEVEL (UNFRACTIONATED): Heparin Unfractionated: 0.53 [IU]/mL (ref 0.30–0.70)

## 2023-03-03 LAB — APTT: aPTT: 79 s — ABNORMAL HIGH (ref 24–36)

## 2023-03-03 LAB — GLUCOSE, CAPILLARY
Glucose-Capillary: 119 mg/dL — ABNORMAL HIGH (ref 70–99)
Glucose-Capillary: 132 mg/dL — ABNORMAL HIGH (ref 70–99)
Glucose-Capillary: 116 mg/dL — ABNORMAL HIGH (ref 70–99)
Glucose-Capillary: 175 mg/dL — ABNORMAL HIGH (ref 70–99)
Glucose-Capillary: 148 mg/dL — ABNORMAL HIGH (ref 70–99)

## 2023-03-03 NOTE — Progress Notes (Signed)
Mobility Specialist Progress Note:   03/03/23 1125  Mobility  Activity Ambulated with assistance in hallway  Level of Assistance Contact guard assist, steadying assist  Assistive Device Front wheel walker  Distance Ambulated (ft) 100 ft  Activity Response Tolerated well  Mobility Referral Yes  $Mobility charge 1 Mobility  Mobility Specialist Start Time (ACUTE ONLY) 0933  Mobility Specialist Stop Time (ACUTE ONLY) 0946  Mobility Specialist Time Calculation (min) (ACUTE ONLY) 13 min   Pt received in bed eager for mobility. Pt states she is "feeling much better today, pain being at a 4/10". No physical assistance needed throughout session. Pt HR reached up to 147 when returning to room. Requested to use the BR. After sitting down pts HR returned to 99. Instructed to use the call light when finished. RN notified.  Pre Mobility HR 98 During Mobility HR 117-147  Post Mobility HR 99  Thompson Grayer Mobility Specialist  Please contact vis Secure Chat or  Rehab Office [phone]

## 2023-03-03 NOTE — Plan of Care (Signed)
Patient is scheduled for L1 KP at 8 am tomorrow pending NIR schedule.   RN/Pharmacy asked to stop heparin infusion at 6 am tomorrow.  Will repeat CBC with diff in AM.  Please call IR for questions and concerns.   Lynann Bologna Niala Stcharles PA-C 03/03/2023 2:07 PM

## 2023-03-03 NOTE — Progress Notes (Signed)
PHARMACY - ANTICOAGULATION CONSULT NOTE  Pharmacy Consult for heparin Indication: atrial fibrillation  Allergies  Allergen Reactions   Codeine Other (See Comments)    Unknown reaction   Crestor [Rosuvastatin] Other (See Comments)    Myalgias  Weakness    Patient Measurements: Height: 5' 10.51" (179.1 cm) Weight: (!) 141.8 kg (312 lb 11.2 oz) IBW/kg (Calculated) : 69.68 Heparin Dosing Weight: 104  Vital Signs: Temp: 97.9 F (36.6 C) (11/24 0322) Temp Source: Oral (11/24 0322) BP: 137/88 (11/24 0803) Pulse Rate: 84 (11/24 0803)  Labs: Recent Labs    02/28/23 0920 02/28/23 1526 03/01/23 0610 03/01/23 0906 03/02/23 0328 03/02/23 1136 03/02/23 1730 03/03/23 0408  HGB  --   --  14.3  --  14.0  --   --   --   HCT  --   --  44.8  --  44.5  --   --   --   PLT  --   --  273  --  250  --   --   --   APTT  --    < > 96*  --  118* 113* 98* 79*  LABPROT  --   --  14.7  --   --   --   --   --   INR  --   --  1.1  --   --   --   --   --   HEPARINUNFRC  --   --  0.85*  --  0.81*  --   --  0.53  CREATININE 0.64  --   --  0.76 0.56  --   --   --    < > = values in this interval not displayed.    Estimated Creatinine Clearance: 110.5 mL/min (by C-G formula based on SCr of 0.56 mg/dL).   Medical History: Past Medical History:  Diagnosis Date   Atrial fibrillation (HCC)    Celiac artery dissection (HCC)    Diabetes mellitus (HCC)    History of DVT (deep vein thrombosis) 2010   Hyperlipidemia    Hypertension    Morbid obesity (HCC)    Assessment: 17 yoF admitted with AF started on apixaban. Pt with ongoing pain and known L1 compression so apixaban held and IR consulted for possible vertebroplasty. Pharmacy to dose IV heparin in meantime. Last apixaban dose was 11/20 am - will give small bolus to start given weight and monitor via aPTTs.  Vertebroplasty with IR was unable to be completed on 11/22, but noted plans for procedure on Monday, 11/25.   aPTT therapeutic at 79 and  heparin level therapeutic at 0.53 on 1200 units/hr. Levels are now correlating, so will move forward with heparin level monitoring only. No issues with infusion and no new bleeding noted per RN.  Goal of Therapy:  Heparin level 0.3-0.7 units/ml aPTT 66-102 seconds Monitor platelets by anticoagulation protocol: Yes   Plan:  Continue heparin at 1200 units/hr Daily heparin level and CBC Follow-up plans to resume Eliquis after procedure  Lennie Muckle, PharmD PGY1 Pharmacy Resident 03/03/2023 8:30 AM

## 2023-03-03 NOTE — Progress Notes (Addendum)
PROGRESS NOTE    Tracie [name]  TIW:[id number] DOB: 1960-10-20 DOA: 02/24/2023 PCP: Tracie Beams, MD   Brief Narrative:  Tracie [name] is an 62 y.o. female past medical history of hypertension diabetes mellitus type 2, chronic atrial fibrillation and history of DVT on apixaban, Tracie [name] seen in the ED in 02/21/2023 for back pain diagnosed with sciatica sent home with medications, saw her PCP but was given muscle relaxer and is steroid taper comes to the ED complaining of back pain was found to be in A-fib with RVR.  She says she was getting better but the pain came back worst no neurological symptoms. 2D echo was done that showed an EF of 60% no wall motion abnormality.  Assessment & Plan:   Principal Problem:   Atrial fibrillation with rapid ventricular response (HCC) Active Problems:   Lumbar pain   Hypokalemia   Leukocytosis   Controlled type 2 diabetes mellitus without complication, without long-term current use of insulin (HCC)   Essential hypertension, benign   Hyperlipidemia   Atrial fibrillation with RVR (HCC): Rate controlled Rate controlled on diltiazem 300 and metoprolol 100 per cardiology ChadsVasc > 4. Cardiology recommending outpatient sleep study Monitor electrolytes She is not a good candidate for warfarin.  TOC consulted for anticoagulation. Holding eliquis for procedure(see below). N.p.o. at midnight  Lumbar pain/L1 compression fracture/osteoporosis: Repeat MRI with contrast showed body lesions are not enhancing or likely to be benign. Her pain is minimally controlled with IV hydromorphone and Flexeril. IR following - possible vertebroplasty 03/04/2023 Now on IV heparin   Osteoporosis: Follow-up PCP as an outpatient will need to be on calcium, vitamin D and biphosphonate's   Hypokalemia/hypomagnesemia: Stable   Leukocytosis, resolved Likely reactive due to steroids, resolved Remains afebrile - no signs/symptoms of infection   Controlled  diabetes mellitus type 2: Resumed statin Jardiance. Continue long-acting insulin plus sliding scale CBGs ACH S, A1c of 7.0. Blood glucose well-controlled.   Essential hypertension: Currently on Cardizem, losartan and metoprolol for blood pressure still elevated.   Hyperlipidemia: Continue statins.   Lower extremity edema/mild RV dysfunction: Suspect multifactorial in the setting of obesity A-fib, probably undiagnosed such as sleep apnea. Cardiology recommended sleep study as an outpatient.  DVT prophylaxis:    Code Status:   Code Status: Full Code  Family Communication: At bedside  Status is: Inpatient  Dispo: The patient is from: Home              Anticipated d/c is to: Home              Anticipated d/c date is: 24-48hours              Patient currently not medically stable for discharge  Consultants:  Cardiology, interventional radiology  Procedures:  Kyphoplasty tentatively planned 11/25  Antimicrobials:  None  Subjective: No acute issues or events overnight  Objective: Vitals:   03/02/23 1928 03/02/23 2209 03/03/23 0322 03/03/23 0803  BP:  (!) 147/89 (!) 141/98 137/88  Pulse:  89  84  Resp: 20 18 (!) 22 15  Temp: 98.2 F (36.8 C) 98.2 F (36.8 C) 97.9 F (36.6 C)   TempSrc: Oral Oral Oral   SpO2:  92%    Weight:   (!) 141.8 kg   Height:       No intake or output data in the 24 hours ending 03/03/23 0841  Filed Weights   03/01/23 0400 03/02/23 0729 03/03/23 0322  Weight: (!) 141.4 kg (!) 142.1 kg Marland Kitchen)  141.8 kg    Examination:  General:  Pleasantly resting in bed, No acute distress. HEENT:  Normocephalic atraumatic.  Sclerae nonicteric, noninjected.  Extraocular movements intact bilaterally. Neck:  Without mass or deformity.  Trachea is midline. Lungs:  Clear to auscultate bilaterally without rhonchi, wheeze, or rales. Heart:  Regular rate and rhythm.  Without murmurs, rubs, or gallops. Abdomen:  Soft, nontender, nondistended.  Without guarding  or rebound. Extremities: Without cyanosis, clubbing, edema, or obvious deformity. Skin:  Warm and dry, no erythema.  Data Reviewed: I have personally reviewed following labs and imaging studies  CBC: Recent Labs  Lab 02/26/23 0922 02/27/23 0603 02/28/23 0441 03/01/23 0610 03/02/23 0328  WBC 11.6* 12.0* 12.0* 10.4 8.9  HGB 14.6 14.0 13.6 14.3 14.0  HCT 46.8* 44.8 43.3 44.8 44.5  MCV 89.7 88.4 87.5 88.4 89.2  PLT 287 256 260 273 250   Basic Metabolic Panel: Recent Labs  Lab 02/26/23 0922 02/27/23 0603 02/28/23 0441 02/28/23 0920 03/01/23 0610 03/01/23 0906 03/02/23 0328  NA 135 136 137 135  --  136 134*  K 4.2 3.6 3.6 3.3*  --  4.1 3.5  CL 102 102 103 102  --  102 102  CO2 23 26 26 25   --  23 20*  GLUCOSE 121* 111* 100* 126*  --  107* 125*  BUN 12 10 8 8   --  6* 8  CREATININE 0.68 0.73 0.77 0.64  --  0.76 0.56  CALCIUM 8.5* 8.6* 8.5* 8.6*  --  8.8* 8.6*  MG 1.7 1.5* 1.7  --  1.7  --   --   PHOS  --   --   --  3.4  --   --   --    GFR: Estimated Creatinine Clearance: 110.5 mL/min (by C-G formula based on SCr of 0.56 mg/dL). Liver Function Tests: Recent Labs  Lab 02/28/23 0920  ALBUMIN 2.9*   No results for input(s): "LIPASE", "AMYLASE" in the last 168 hours. No results for input(s): "AMMONIA" in the last 168 hours. Coagulation Profile: Recent Labs  Lab 03/01/23 0610  INR 1.1   CBG: Recent Labs  Lab 03/02/23 1230 03/02/23 1711 03/02/23 2110 03/03/23 0604 03/03/23 0741  GLUCAP 78 129* 123* 119* 132*   No results found for this or any previous visit (from the past 240 hour(s)).   Radiology Studies: No results found.  Scheduled Meds:  celecoxib  100 mg Oral Daily   diltiazem  300 mg Oral Daily   empagliflozin  25 mg Oral Daily   insulin aspart  0-15 Units Subcutaneous TID WC   insulin aspart  0-5 Units Subcutaneous QHS   insulin aspart  4 Units Subcutaneous TID WC   insulin detemir  5 Units Subcutaneous QHS   lidocaine  1 patch Transdermal  Daily   losartan  100 mg Oral Daily   metoprolol succinate  100 mg Oral BID   pantoprazole  40 mg Oral BID   Continuous Infusions:  heparin 1,200 Units/hr (03/02/23 1257)     LOS: 5 days   Time spent:  Tracie Fallen, DO [facility]  If 7PM-7AM, please contact night-coverage [email or web address]  03/03/2023, 8:41 AM

## 2023-03-03 NOTE — Progress Notes (Signed)
   Patient Name: Tracie [name] Date of Encounter: 03/03/2023 [facility] Cardiologist: Parke Poisson, MD   Interval Summary  .    In bed, more comfortable. HR improved  Vital Signs .    Vitals:   03/02/23 1928 03/02/23 2209 03/03/23 0322 03/03/23 0803  BP:  (!) 147/89 (!) 141/98 137/88  Pulse:  89  84  Resp: 20 18 (!) 22 15  Temp: 98.2 F (36.8 C) 98.2 F (36.8 C) 97.9 F (36.6 C)   TempSrc: Oral Oral Oral   SpO2:  92%    Weight:   (!) 141.8 kg   Height:       No intake or output data in the 24 hours ending 03/03/23 1052    03/03/2023    3:22 AM 03/02/2023    7:29 AM 03/01/2023    4:00 AM  Last 3 Weights  Weight (lbs) 312 lb 11.2 oz 313 lb 4.4 oz 311 lb 11.2 oz  Weight (kg) 141.84 kg 142.1 kg 141.386 kg      Telemetry/ECG    AFIB 80's (increases to 150 with exercise) - Personally Reviewed  Physical Exam .   GEN: No acute distress.   Neck: No JVD Cardiac: IRRR, no murmurs, rubs, or gallops.  Respiratory: Clear to auscultation bilaterally. GI: Soft, nontender, non-distended  MS: No edema  Assessment & Plan .     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 -We have increased her diltiazem long-acting up to 300 mg a day -We also increased metoprolol succinate to 100 mg twice a day.  She is adamant that she cannot take the tartrate version. -Heart rates have improved. -Not interested in cardioversion  Chronic anticoagulation - Eliquis 5 mg twice a day was stopped for vertebroplasty. -Resume when able. Currently on heparin IV  Diabetes type 2 with hypertension - Hemoglobin A1c 7.0 on Jardiance metoprolol diltiazem losartan  Persistent back pain - Vertebroplasty planned again attempt on  Monday.  For questions or updates, please contact [facility] Please consult [email or web address] for contact info under        Signed, Donato Schultz, MD

## 2023-03-04 ENCOUNTER — Inpatient Hospital Stay (HOSPITAL_COMMUNITY): Payer: PPO

## 2023-03-04 DIAGNOSIS — I4891 Unspecified atrial fibrillation: Secondary | ICD-10-CM | POA: Diagnosis not present

## 2023-03-04 HISTORY — PX: IR KYPHO LUMBAR INC FX REDUCE BONE BX UNI/BIL CANNULATION INC/IMAGING: IMG5519

## 2023-03-04 LAB — HEPARIN LEVEL (UNFRACTIONATED): Heparin Unfractionated: 0.18 [IU]/mL — ABNORMAL LOW (ref 0.30–0.70)

## 2023-03-04 LAB — GLUCOSE, CAPILLARY
Glucose-Capillary: 139 mg/dL — ABNORMAL HIGH (ref 70–99)
Glucose-Capillary: 142 mg/dL — ABNORMAL HIGH (ref 70–99)
Glucose-Capillary: 129 mg/dL — ABNORMAL HIGH (ref 70–99)
Glucose-Capillary: 131 mg/dL — ABNORMAL HIGH (ref 70–99)

## 2023-03-04 LAB — CBC WITH DIFFERENTIAL/PLATELET
Abs Immature Granulocytes: 0.03 10*3/uL (ref 0.00–0.07)
Basophils Absolute: 0 10*3/uL (ref 0.0–0.1)
Basophils Relative: 0 %
Eosinophils Absolute: 0.3 10*3/uL (ref 0.0–0.5)
Eosinophils Relative: 4 %
HCT: 42.7 % (ref 36.0–46.0)
Hemoglobin: 13.5 g/dL (ref 12.0–15.0)
Immature Granulocytes: 0 %
Lymphocytes Relative: 29 %
Lymphs Abs: 2.3 10*3/uL (ref 0.7–4.0)
MCH: 28.2 pg (ref 26.0–34.0)
MCHC: 31.6 g/dL (ref 30.0–36.0)
MCV: 89.3 fL (ref 80.0–100.0)
Monocytes Absolute: 0.6 10*3/uL (ref 0.1–1.0)
Monocytes Relative: 7 %
Neutro Abs: 4.8 10*3/uL (ref 1.7–7.7)
Neutrophils Relative %: 60 %
Platelets: 210 10*3/uL (ref 150–400)
RBC: 4.78 MIL/uL (ref 3.87–5.11)
RDW: 14 % (ref 11.5–15.5)
WBC: 8.1 10*3/uL (ref 4.0–10.5)
nRBC: 0 % (ref 0.0–0.2)

## 2023-03-04 LAB — BASIC METABOLIC PANEL
Anion gap: 7 (ref 5–15)
BUN: 9 mg/dL (ref 8–23)
CO2: 24 mmol/L (ref 22–32)
Calcium: 8.8 mg/dL — ABNORMAL LOW (ref 8.9–10.3)
Chloride: 105 mmol/L (ref 98–111)
Creatinine, Ser: 0.77 mg/dL (ref 0.44–1.00)
GFR, Estimated: >60 mL/min (ref >60)
Glucose, Bld: 158 mg/dL — ABNORMAL HIGH (ref 70–99)
Potassium: 3.5 mmol/L (ref 3.5–5.1)
Sodium: 136 mmol/L (ref 135–145)

## 2023-03-04 MED ORDER — DILTIAZEM HCL ER COATED BEADS 180 MG PO CP24
360.0000 mg | ORAL_CAPSULE | Freq: Every day | ORAL | Status: DC
  Administered 2023-03-05: 360 mg via ORAL
  Filled 2023-03-04: qty 2

## 2023-03-04 MED ORDER — BUPIVACAINE HCL 0.25 % IJ SOLN
30.0000 mL | Freq: Once | INTRAMUSCULAR | Status: AC
  Administered 2023-03-04: 30 mL

## 2023-03-04 MED ORDER — CEFAZOLIN SODIUM-DEXTROSE 2-4 GM/100ML-% IV SOLN
INTRAVENOUS | Status: AC | PRN
  Administered 2023-03-04: 2 g via INTRAVENOUS

## 2023-03-04 MED ORDER — FENTANYL CITRATE (PF) 100 MCG/2ML IJ SOLN
INTRAMUSCULAR | Status: AC | PRN
  Administered 2023-03-04 (×2): 25 ug via INTRAVENOUS

## 2023-03-04 MED ORDER — TOBRAMYCIN SULFATE 1.2 G IJ SOLR
INTRAMUSCULAR | Status: AC
  Filled 2023-03-04: qty 1.2

## 2023-03-04 MED ORDER — FENTANYL CITRATE (PF) 100 MCG/2ML IJ SOLN
INTRAMUSCULAR | Status: AC
  Filled 2023-03-04: qty 2

## 2023-03-04 MED ORDER — MIDAZOLAM HCL 2 MG/2ML IJ SOLN
INTRAMUSCULAR | Status: AC | PRN
  Administered 2023-03-04 (×2): 1 mg via INTRAVENOUS

## 2023-03-04 MED ORDER — HEPARIN (PORCINE) 25000 UT/250ML-% IV SOLN
1400.0000 [IU]/h | INTRAVENOUS | Status: DC
  Administered 2023-03-04: 1200 [IU]/h via INTRAVENOUS
  Filled 2023-03-04 (×2): qty 250

## 2023-03-04 MED ORDER — HYDROMORPHONE HCL 1 MG/ML IJ SOLN
1.0000 mg | Freq: Four times a day (QID) | INTRAMUSCULAR | Status: DC | PRN
  Administered 2023-03-04 – 2023-03-05 (×2): 1 mg via INTRAVENOUS
  Filled 2023-03-04 (×2): qty 1

## 2023-03-04 MED ORDER — SODIUM CHLORIDE 0.9 % IV SOLN: INTRAVENOUS | Status: AC

## 2023-03-04 MED ORDER — POLYETHYLENE GLYCOL 3350 17 G PO PACK: 17.0000 g | PACK | Freq: Two times a day (BID) | ORAL | Status: DC

## 2023-03-04 MED ORDER — BUPIVACAINE HCL (PF) 0.25 % IJ SOLN
INTRAMUSCULAR | Status: AC
  Filled 2023-03-04: qty 30

## 2023-03-04 MED ORDER — POLYETHYLENE GLYCOL 3350 17 G PO PACK
17.0000 g | PACK | Freq: Every day | ORAL | Status: DC
Start: 2023-03-04 — End: 2023-03-04
  Administered 2023-03-04: 17 g via ORAL
  Filled 2023-03-04: qty 1

## 2023-03-04 MED ORDER — CEFAZOLIN SODIUM-DEXTROSE 2-4 GM/100ML-% IV SOLN
INTRAVENOUS | Status: AC
  Filled 2023-03-04: qty 100

## 2023-03-04 MED ORDER — MIDAZOLAM HCL 2 MG/2ML IJ SOLN
INTRAMUSCULAR | Status: AC
Start: 2023-03-04 — End: ?
  Filled 2023-03-04: qty 2

## 2023-03-04 NOTE — Progress Notes (Signed)
Patient ID: Tracie [name], female   DOB: April 27, 1960, 62 y.o.   MRN: [id number]  Postprocedural D/C instructions.  1.  No stooping bending or lifting weights 10 pounds for 2 weeks.  2.  Use a walker to ambulate for 2 weeks or more if necessary.  3.  No driving for 2 weeks.  4. call referring MD in 2 weeks for follow-up as needed.       Fatima Sanger MD.

## 2023-03-04 NOTE — Plan of Care (Signed)
  Problem: Education: Goal: Ability to describe self-care measures that may prevent or decrease complications (Diabetes Survival Skills Education) will improve Outcome: Progressing Goal: Individualized Educational Video(s) Outcome: Progressing   Problem: Coping: Goal: Ability to adjust to condition or change in health will improve Outcome: Progressing   

## 2023-03-04 NOTE — Progress Notes (Signed)
PROGRESS NOTE    Tracie [name]  QMV:[id number] DOB: Jul 28, 1960 DOA: 02/24/2023 PCP: Aliene Beams, MD   Brief Narrative:  Tracie [name] is an 62 y.o. female past medical history of hypertension diabetes mellitus type 2, chronic atrial fibrillation and history of DVT on apixaban, Tracie [name] seen in the ED in 02/21/2023 for back pain diagnosed with sciatica sent home with medications, saw her PCP but was given muscle relaxer and is steroid taper comes to the ED complaining of back pain was found to be in A-fib with RVR.  She says she was getting better but the pain came back worst no neurological symptoms. 2D echo was done that showed an EF of 60% no wall motion abnormality.  Assessment & Plan:   Principal Problem:   Atrial fibrillation with rapid ventricular response (HCC) Active Problems:   Lumbar pain   Hypokalemia   Leukocytosis   Controlled type 2 diabetes mellitus without complication, without long-term current use of insulin (HCC)   Essential hypertension, benign   Hyperlipidemia  Atrial fibrillation with RVR (HCC): Rate controlled Rate controlled on diltiazem 300 and metoprolol 100 per cardiology ChadsVasc > 4. Cardiology recommending outpatient sleep study Monitor electrolytes She is not a good candidate for warfarin.  TOC consulted for anticoagulation. Holding eliquis for procedure(see below) -resume heparin per surgery, transition to Eliquis at discharge  Lumbar pain/L1 compression fracture/osteoporosis: Repeat MRI with contrast showed body lesions are not enhancing or likely to be benign. IR following, L1 kyphoplasty this morning, tolerated well, postop care and PT recommendations per surgery Pending PT OT recommendations, disposition home versus rehab  Osteoporosis: Follow-up PCP as an outpatient will need to be on calcium, vitamin D and biphosphonate's   Hypokalemia/hypomagnesemia: Stable   Leukocytosis, resolved Likely reactive due to steroids,  resolved Remains afebrile - no signs/symptoms of infection   Controlled diabetes mellitus type 2: Resumed statin Jardiance. Continue long-acting insulin plus sliding scale CBGs ACH S, A1c of 7.0. Blood glucose well-controlled.   Essential hypertension: Currently on Cardizem, losartan and metoprolol for blood pressure still elevated.   Hyperlipidemia: Continue statins.   Lower extremity edema/mild RV dysfunction: Suspect multifactorial in the setting of obesity A-fib, probably undiagnosed such as sleep apnea. Cardiology recommended sleep study as an outpatient.  DVT prophylaxis:    Code Status:   Code Status: Full Code  Family Communication: At bedside  Status is: Inpatient  Dispo: The patient is from: Home              Anticipated d/c is to: Home              Anticipated d/c date is: 24-48hours              Patient currently not medically stable for discharge  Consultants:  Cardiology, interventional radiology  Procedures:  Kyphoplasty 11/25  Antimicrobials:  None  Subjective: No acute issues or events overnight, tolerated procedure well, back pain appears to be improving, patient otherwise denies nausea vomiting diarrhea constipation fever chills or chest pain  Objective: Vitals:   03/03/23 1131 03/03/23 2023 03/04/23 0020 03/04/23 0418  BP: 110/71 138/80 128/71 114/71  Pulse: 81 93 86 77  Resp: (!) 21 16 16 18   Temp: 98.1 F (36.7 C) 97.8 F (36.6 C) 97.9 F (36.6 C) 97.7 F (36.5 C)  TempSrc: Oral Oral Oral Oral  SpO2:  96% 93% 95%  Weight:    (!) 141.6 kg  Height:       No intake or output data  in the 24 hours ending 03/04/23 0818  Filed Weights   03/02/23 0729 03/03/23 0322 03/04/23 0418  Weight: (!) 142.1 kg (!) 141.8 kg (!) 141.6 kg    Examination:  General:  Pleasantly resting in bed, No acute distress. HEENT:  Normocephalic atraumatic.  Sclerae nonicteric, noninjected.  Extraocular movements intact bilaterally. Neck:  Without mass or  deformity.  Trachea is midline. Lungs:  Clear to auscultate bilaterally without rhonchi, wheeze, or rales. Heart:  Regular rate and rhythm.  Without murmurs, rubs, or gallops. Abdomen:  Soft, nontender, nondistended.  Without guarding or rebound. Extremities: Without cyanosis, clubbing, edema, or obvious deformity. Skin:  Warm and dry, no erythema.  Data Reviewed: I have personally reviewed following labs and imaging studies  CBC: Recent Labs  Lab 02/27/23 0603 02/28/23 0441 03/01/23 0610 03/02/23 0328 03/04/23 0320  WBC 12.0* 12.0* 10.4 8.9 8.1  NEUTROABS  --   --   --   --  4.8  HGB 14.0 13.6 14.3 14.0 13.5  HCT 44.8 43.3 44.8 44.5 42.7  MCV 88.4 87.5 88.4 89.2 89.3  PLT 256 260 273 250 210   Basic Metabolic Panel: Recent Labs  Lab 02/26/23 0922 02/27/23 0603 02/28/23 0441 02/28/23 0920 03/01/23 0610 03/01/23 0906 03/02/23 0328 03/04/23 0320  NA 135 136 137 135  --  136 134* 136  K 4.2 3.6 3.6 3.3*  --  4.1 3.5 3.5  CL 102 102 103 102  --  102 102 105  CO2 23 26 26 25   --  23 20* 24  GLUCOSE 121* 111* 100* 126*  --  107* 125* 158*  BUN 12 10 8 8   --  6* 8 9  CREATININE 0.68 0.73 0.77 0.64  --  0.76 0.56 0.77  CALCIUM 8.5* 8.6* 8.5* 8.6*  --  8.8* 8.6* 8.8*  MG 1.7 1.5* 1.7  --  1.7  --   --   --   PHOS  --   --   --  3.4  --   --   --   --    GFR: Estimated Creatinine Clearance: 110.5 mL/min (by C-G formula based on SCr of 0.77 mg/dL). Liver Function Tests: Recent Labs  Lab 02/28/23 0920  ALBUMIN 2.9*   No results for input(s): "LIPASE", "AMYLASE" in the last 168 hours. No results for input(s): "AMMONIA" in the last 168 hours. Coagulation Profile: Recent Labs  Lab 03/01/23 0610  INR 1.1   CBG: Recent Labs  Lab 03/03/23 0741 03/03/23 1135 03/03/23 1648 03/03/23 2101 03/04/23 0738  GLUCAP 132* 116* 175* 148* 139*   No results found for this or any previous visit (from the past 240 hour(s)).   Radiology Studies: No results found.  Scheduled  Meds:  celecoxib  100 mg Oral Daily   diltiazem  300 mg Oral Daily   empagliflozin  25 mg Oral Daily   insulin aspart  0-15 Units Subcutaneous TID WC   insulin aspart  0-5 Units Subcutaneous QHS   insulin aspart  4 Units Subcutaneous TID WC   insulin detemir  5 Units Subcutaneous QHS   lidocaine  1 patch Transdermal Daily   losartan  100 mg Oral Daily   metoprolol succinate  100 mg Oral BID   pantoprazole  40 mg Oral BID   Continuous Infusions:     LOS: 6 days   Time spent:  [name], DO [facility]  If 7PM-7AM, please contact night-coverage [email or web address]  03/04/2023, 8:18 AM

## 2023-03-04 NOTE — Progress Notes (Signed)
PHARMACY - ANTICOAGULATION CONSULT NOTE  Pharmacy Consult for heparin Indication: atrial fibrillation  Allergies  Allergen Reactions   Codeine Other (See Comments)    Unknown reaction   Crestor [Rosuvastatin] Other (See Comments)    Myalgias  Weakness    Patient Measurements: Height: 5' 10.51" (179.1 cm) Weight: (!) 141.6 kg (312 lb 1.6 oz) IBW/kg (Calculated) : 69.68 Heparin Dosing Weight: 104  Vital Signs: Temp: 97.7 F (36.5 C) (11/25 0418) Temp Source: Oral (11/25 1153) BP: 153/84 (11/25 1153) Pulse Rate: 107 (11/25 0957)  Labs: Recent Labs    03/02/23 0328 03/02/23 1136 03/02/23 1730 03/03/23 0408 03/04/23 0320  HGB 14.0  --   --   --  13.5  HCT 44.5  --   --   --  42.7  PLT 250  --   --   --  210  APTT 118* 113* 98* 79*  --   HEPARINUNFRC 0.81*  --   --  0.53  --   CREATININE 0.56  --   --   --  0.77    Estimated Creatinine Clearance: 110.5 mL/min (by C-G formula based on SCr of 0.77 mg/dL).   Medical History: Past Medical History:  Diagnosis Date   Atrial fibrillation (HCC)    Celiac artery dissection (HCC)    Diabetes mellitus (HCC)    History of DVT (deep vein thrombosis) 2010   Hyperlipidemia    Hypertension    Morbid obesity (HCC)    Assessment: 35 yoF admitted with AF started on apixaban. Pt with ongoing pain and known L1 compression so apixaban held and IR consulted for possible vertebroplasty. Pharmacy to dose IV heparin in meantime. Last apixaban dose was 11/20 am - will give small bolus to start given weight and monitor via aPTTs.  Kyophoplasty completed 11/25, per Dr. Corliss Skains hold heparin x4h.  Goal of Therapy:  Heparin level 0.3-0.7 units/ml aPTT 66-102 seconds Monitor platelets by anticoagulation protocol: Yes   Plan:  Resume heparin 1200 units/h no bolus  Check heparin level in 6h  Fredonia Highland, PharmD, [place], [facility] Clinical Pharmacist [phone] Please check AMION for all [facility] Pharmacy numbers 03/04/2023

## 2023-03-04 NOTE — Progress Notes (Signed)
PHARMACY - ANTICOAGULATION CONSULT NOTE  Pharmacy Consult for heparin Indication: atrial fibrillation  Allergies  Allergen Reactions   Codeine Other (See Comments)    Unknown reaction   Crestor [Rosuvastatin] Other (See Comments)    Myalgias  Weakness    Patient Measurements: Height: 5' 10.51" (179.1 cm) Weight: (!) 141.6 kg (312 lb 1.6 oz) IBW/kg (Calculated) : 69.68 Heparin Dosing Weight: 104  Vital Signs: Temp: 97.6 F (36.4 C) (11/25 2046) Temp Source: Oral (11/25 2046) BP: 161/89 (11/25 2046) Pulse Rate: 119 (11/25 2046)  Labs: Recent Labs    03/02/23 0328 03/02/23 1136 03/02/23 1730 03/03/23 0408 03/04/23 0320 03/04/23 2152  HGB 14.0  --   --   --  13.5  --   HCT 44.5  --   --   --  42.7  --   PLT 250  --   --   --  210  --   APTT 118* 113* 98* 79*  --   --   HEPARINUNFRC 0.81*  --   --  0.53  --  0.18*  CREATININE 0.56  --   --   --  0.77  --     Estimated Creatinine Clearance: 110.5 mL/min (by C-G formula based on SCr of 0.77 mg/dL).   Medical History: Past Medical History:  Diagnosis Date   Atrial fibrillation (HCC)    Celiac artery dissection (HCC)    Diabetes mellitus (HCC)    History of DVT (deep vein thrombosis) 2010   Hyperlipidemia    Hypertension    Morbid obesity (HCC)    Assessment: 40 yoF admitted with AF started on apixaban. Pt with ongoing pain and known L1 compression so apixaban held and IR consulted for possible vertebroplasty. Pharmacy to dose IV heparin in meantime. Last apixaban dose was 11/20 am - will give small bolus to start given weight and monitor via aPTTs.  Kyophoplasty completed 11/25, per Dr. Corliss Skains hold heparin x4h.  11/25 PM: heparin level returned at 0.18 on 1200 units/hr (subtherapeutic). Per RN, no issues with the heparin infusion running continuously or signs/symptoms of bleeding. Last CBC stable  Goal of Therapy:  Heparin level 0.3-0.7 units/ml Monitor platelets by anticoagulation protocol: Yes   Plan:   Increase heparin to 1400 units/h no bolus  Check heparin level in 6h Monitor CBC and heparin level daily  Arabella Merles, PharmD. Clinical Pharmacist 03/04/2023 11:29 PM

## 2023-03-04 NOTE — Progress Notes (Signed)
PT Cancellation Note  Patient Details Name: Tracie [name] MRN: [id number] DOB: Feb 01, 1961   Cancelled Treatment:    Reason Eval/Treat Not Completed: Medical issues which prohibited therapy. Pt on bedrest after kyphoplasty this AM. Will check back later.   Angelina Ok [facility] 03/04/2023, 10:27 AM Skip Mayer PT Acute Rehabilitation Services Office [phone]

## 2023-03-04 NOTE — Progress Notes (Signed)
PT Cancellation Note  Patient Details Name: Tracie [name] MRN: [id number] DOB: 1960-06-02   Cancelled Treatment:    Reason Eval/Treat Not Completed: Other (comment). Pt off of bedrest. Has been up to bathroom with nursing earlier and deferred PT at this time. Will try again tomorrow.   Angelina Ok [facility] 03/04/2023, 3:26 PM Skip Mayer PT Acute [employer] [phone]

## 2023-03-04 NOTE — Progress Notes (Signed)
OT Cancellation Note  Patient Details Name: Tracie [name] MRN: [id number] DOB: Mar 24, 1961   Cancelled Treatment:    Reason Eval/Treat Not Completed: Active bedrest order Pt on bedrest after kyphoplasty this AM. Will check back later as schedule permits.  Lorre Munroe 03/04/2023, 11:54 AM

## 2023-03-04 NOTE — Procedures (Signed)
INR  Status post L1 balloon kyphoplasty.  Bipedicular approach.  Patient tolerated  the procedure well.  No acute complications.  Fatima Sanger MD.

## 2023-03-04 NOTE — Progress Notes (Addendum)
Rounding Note    Patient Name: Tracie [name] Date of Encounter: 03/04/2023  [facility] Cardiologist: Parke Poisson, MD   Subjective   No acute overnight events. I saw patient after she got back from her vertebroplasty with IR. Heart rates are a little faster at rest in the low 100s to 110s. Suspect some of this is due to pain and the fact that her rate control agents were not given until after the procedure. She is asymptomatic with this. No chest pain, shortness of breath, or palpitations. She reports 5/10 back pain (improved from a 8/10 with Dilaudid).   Inpatient Medications    Scheduled Meds:  celecoxib  100 mg Oral Daily   diltiazem  300 mg Oral Daily   empagliflozin  25 mg Oral Daily   insulin aspart  0-15 Units Subcutaneous TID WC   insulin aspart  0-5 Units Subcutaneous QHS   insulin aspart  4 Units Subcutaneous TID WC   insulin detemir  5 Units Subcutaneous QHS   lidocaine  1 patch Transdermal Daily   losartan  100 mg Oral Daily   metoprolol succinate  100 mg Oral BID   pantoprazole  40 mg Oral BID   Continuous Infusions:  PRN Meds: acetaminophen, cyclobenzaprine, HYDROcodone-acetaminophen, HYDROmorphone (DILAUDID) injection, metoprolol tartrate, ondansetron (ZOFRAN) IV   Vital Signs    Vitals:   03/03/23 1131 03/03/23 2023 03/04/23 0020 03/04/23 0418  BP: 110/71 138/80 128/71 114/71  Pulse: 81 93 86 77  Resp: (!) 21 16 16 18   Temp: 98.1 F (36.7 C) 97.8 F (36.6 C) 97.9 F (36.6 C) 97.7 F (36.5 C)  TempSrc: Oral Oral Oral Oral  SpO2:  96% 93% 95%  Weight:    (!) 141.6 kg  Height:       No intake or output data in the 24 hours ending 03/04/23 0624    03/04/2023    4:18 AM 03/03/2023    3:22 AM 03/02/2023    7:29 AM  Last 3 Weights  Weight (lbs) 312 lb 1.6 oz 312 lb 11.2 oz 313 lb 4.4 oz  Weight (kg) 141.568 kg 141.84 kg 142.1 kg      Telemetry    Atrial fibrillation with baseline rates in the 70s to 90s with occasional  spikes in the 120s to 150s (suspect this is with exertion/ ambulation)  - Personally Reviewed  ECG    No new ECG tracing today. - Personally Reviewed  Physical Exam   GEN: Obese Caucasian female in no acute distress.   Neck: JVD difficult to assess due to body habitus. Cardiac: Tachycardic with irregularly irregular rhythm. No murmurs, rubs, or gallops.  Respiratory: Clear to auscultation bilaterally. No wheezes, rhonchi, or rales. MS: Trace lower extremity edema bilaterally.  No deformity. Neuro:  No focal deficits.  Psych: Normal affect. Responds appropriately.   Labs    High Sensitivity Troponin:  No results for input(s): "TROPONINIHS" in the last 720 hours.   Chemistry Recent Labs  Lab 02/27/23 0603 02/28/23 0441 02/28/23 0920 03/01/23 0610 03/01/23 0906 03/02/23 0328 03/04/23 0320  NA 136 137 135  --  136 134* 136  K 3.6 3.6 3.3*  --  4.1 3.5 3.5  CL 102 103 102  --  102 102 105  CO2 26 26 25   --  23 20* 24  GLUCOSE 111* 100* 126*  --  107* 125* 158*  BUN 10 8 8   --  6* 8 9  CREATININE 0.73 0.77 0.64  --  0.76 0.56 0.77  CALCIUM 8.6* 8.5* 8.6*  --  8.8* 8.6* 8.8*  MG 1.5* 1.7  --  1.7  --   --   --   ALBUMIN  --   --  2.9*  --   --   --   --   GFRNONAA >60 >60 >60  --  >60 >60 >60  ANIONGAP 8 8 8   --  11 12 7     Lipids No results for input(s): "CHOL", "TRIG", "HDL", "LABVLDL", "LDLCALC", "CHOLHDL" in the last 168 hours.  Hematology Recent Labs  Lab 03/01/23 0610 03/02/23 0328 03/04/23 0320  WBC 10.4 8.9 8.1  RBC 5.07 4.99 4.78  HGB 14.3 14.0 13.5  HCT 44.8 44.5 42.7  MCV 88.4 89.2 89.3  MCH 28.2 28.1 28.2  MCHC 31.9 31.5 31.6  RDW 14.3 14.1 14.0  PLT 273 250 210   Thyroid No results for input(s): "TSH", "FREET4" in the last 168 hours.  BNPNo results for input(s): "BNP", "PROBNP" in the last 168 hours.  DDimer No results for input(s): "DDIMER" in the last 168 hours.   Radiology    No results found.  Cardiac Studies   Echocardiogram  02/25/2023: Impressions: 1. Left ventricular ejection fraction, by estimation, is 60 to 65%. The  left ventricle has normal function. The left ventricle has no regional  wall motion abnormalities. There is mild left ventricular hypertrophy.   2. Right ventricular systolic function reduced. The right ventricular  size is grossly normal. There is normal pulmonary artery systolic  pressure.   3. Left atrial size was mildly dilated.   4. Right atrial size was moderately dilated.   5. The mitral valve is normal in structure. Mild mitral valve  regurgitation. No evidence of mitral stenosis.   6. The aortic valve was not well visualized. Aortic valve regurgitation  is trivial. No aortic stenosis is present.   7. Aortic dilatation noted. There is dilatation of the ascending aorta,  measuring 39 mm.   8. The inferior vena cava is dilated in size with >50% respiratory  variability, suggesting right atrial pressure of 8 mmHg.   Comparison(s): No prior Echocardiogram.   Patient Profile     62 y.o. 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  Assessment & Plan    Persistent Atrial Fibrillation Initially diagnosed in 2021. She declined anticoagulation at that time. She presented with back pain and was inicidentally noted to be in atrial fibrillation with RVR. Echo showed LVEF of 60-65%.  - Prior to vertebroplasty toady, baseline rates in the 70s to 90s with occasional spikes in the 120s to 150s (suspect this is with exertion/ ambulation). Following procedure, rates in the l00s to 110s at rest. Suspect this is due to pain and the fact that rate control agents were not given  until after her procedure. - Continue Diltiazem 300mg  daily. - Continue Toprol-XL 100mg  twice daily. She is adamant that she cannot tolerate Lopressor. - Can increase Diltiazem to 360mg  daily if rates remain elevted. - CHA2DS2-VASc = 5 (HTN, DM, vascular disease, age, gender). She was open to starting anticoagulation this admission and was started on Eliquis. However, this was stopped and she was placed on IV Heparin in anticipation of vertebroplasty  with IR which is scheduled for today. Restart Eliquis when OK with IR. - Patient is not interested in cardioversion so plan is for a rate control strategy.  Hypertension BP mildly elevated after vertebroplasty today but was mostly well controlled prior to this. Suspect this is partly due to pain and not getting medications until after procedure. - Continue Diltiazem and Toprol-XL as above. - Continue Losartan 100mg  daily. - Can continue to adjust medications if BP remains elevated.   Hyperlipidemia Lipid panel this admission: Total Cholesterol 143, Triglycerides 79, HDL 47, LDL 80.  - Intolerant to Crestor in the past.   Type 2 Diabetes Mellitus Hemoglobin A1c 7.0% this admission.  - Continue Jardiance 25mg  daily.  - Continue basal and sliding scale insulin per primary team. - Management per primary team.  Back Pain L1 Compression Fracture IR is planning for a vertebroplasty today. - Management per IR and primary team.  For questions or updates, please contact [facility] Please consult [email or web address] for contact info under        Signed, Corrin Parker, PA-C  03/04/2023, 6:24 AM    Patient seen, examined. Available data reviewed. Agree with findings, assessment, and plan as outlined by Marjie Skiff, PA-C. The patient has returned from IR after undergoing vertebroplasty. She is uncomfortable. Tele reviewed and shows AF with HR 110-120 bpm. Exam is notable for an alert, oriented woman, uncomfortable secondary to back  pain. JVP normal, heart irreg irreg, tachy, lungs CTAB, abdomen soft, NT, extremities with mild lower extremity edema bilaterally in the pretibial region. Suspect elevated HR related to pain control. Continue current dose of metoprolol succinate. Increase diltiazem to 360 mg daily. Restart apixaban when ok from post-procedural standpoint.   Tonny Bollman, M.D. 03/04/2023 3:23 PM

## 2023-03-05 ENCOUNTER — Other Ambulatory Visit: Payer: Self-pay

## 2023-03-05 ENCOUNTER — Other Ambulatory Visit (HOSPITAL_COMMUNITY): Payer: Self-pay

## 2023-03-05 DIAGNOSIS — I4891 Unspecified atrial fibrillation: Secondary | ICD-10-CM | POA: Diagnosis not present

## 2023-03-05 LAB — CBC
HCT: 44.2 % (ref 36.0–46.0)
Hemoglobin: 13.8 g/dL (ref 12.0–15.0)
MCH: 28.2 pg (ref 26.0–34.0)
MCHC: 31.2 g/dL (ref 30.0–36.0)
MCV: 90.4 fL (ref 80.0–100.0)
Platelets: 214 10*3/uL (ref 150–400)
RBC: 4.89 MIL/uL (ref 3.87–5.11)
RDW: 14.3 % (ref 11.5–15.5)
WBC: 11.5 10*3/uL — ABNORMAL HIGH (ref 4.0–10.5)
nRBC: 0 % (ref 0.0–0.2)

## 2023-03-05 LAB — BASIC METABOLIC PANEL
Anion gap: 12 (ref 5–15)
BUN: 6 mg/dL — ABNORMAL LOW (ref 8–23)
CO2: 21 mmol/L — ABNORMAL LOW (ref 22–32)
Calcium: 8.9 mg/dL (ref 8.9–10.3)
Chloride: 104 mmol/L (ref 98–111)
Creatinine, Ser: 0.73 mg/dL (ref 0.44–1.00)
GFR, Estimated: >60 mL/min (ref >60)
Glucose, Bld: 123 mg/dL — ABNORMAL HIGH (ref 70–99)
Potassium: 3.5 mmol/L (ref 3.5–5.1)
Sodium: 137 mmol/L (ref 135–145)

## 2023-03-05 LAB — GLUCOSE, CAPILLARY
Glucose-Capillary: 122 mg/dL — ABNORMAL HIGH (ref 70–99)
Glucose-Capillary: 140 mg/dL — ABNORMAL HIGH (ref 70–99)

## 2023-03-05 LAB — HEPARIN LEVEL (UNFRACTIONATED)
Heparin Unfractionated: 0.24 [IU]/mL — ABNORMAL LOW (ref 0.30–0.70)
Heparin Unfractionated: 0.54 [IU]/mL (ref 0.30–0.70)

## 2023-03-05 MED ORDER — HYDROCODONE-ACETAMINOPHEN 5-325 MG PO TABS
1.0000 | ORAL_TABLET | Freq: Four times a day (QID) | ORAL | 0 refills | Status: DC | PRN
  Filled 2023-03-05: qty 30, 4d supply, fill #0

## 2023-03-05 MED ORDER — LOPERAMIDE HCL 2 MG PO CAPS
2.0000 mg | ORAL_CAPSULE | Freq: Once | ORAL | Status: DC | PRN
Start: 2023-03-05 — End: 2023-03-05

## 2023-03-05 MED ORDER — CYCLOBENZAPRINE HCL 5 MG PO TABS
5.0000 mg | ORAL_TABLET | Freq: Three times a day (TID) | ORAL | 0 refills | Status: DC | PRN
  Filled 2023-03-05: qty 30, 10d supply, fill #0

## 2023-03-05 MED ORDER — APIXABAN (ELIQUIS) VTE STARTER PACK (10MG AND 5MG)
ORAL_TABLET | ORAL | 0 refills | Status: DC
  Filled 2023-03-05: qty 74, 30d supply, fill #0

## 2023-03-05 MED ORDER — PANTOPRAZOLE SODIUM 40 MG PO TBEC
40.0000 mg | DELAYED_RELEASE_TABLET | Freq: Two times a day (BID) | ORAL | 0 refills | Status: DC
  Filled 2023-03-05: qty 30, 15d supply, fill #0

## 2023-03-05 MED ORDER — APIXABAN 5 MG PO TABS
5.0000 mg | ORAL_TABLET | Freq: Two times a day (BID) | ORAL | Status: DC
  Administered 2023-03-05: 5 mg via ORAL
  Filled 2023-03-05: qty 1

## 2023-03-05 MED ORDER — METOPROLOL SUCCINATE ER 100 MG PO TB24
100.0000 mg | ORAL_TABLET | Freq: Two times a day (BID) | ORAL | 0 refills | Status: DC
  Filled 2023-03-05: qty 60, 30d supply, fill #0

## 2023-03-05 MED ORDER — APIXABAN 5 MG PO TABS
5.0000 mg | ORAL_TABLET | Freq: Two times a day (BID) | ORAL | 0 refills | Status: DC
  Filled 2023-03-05: qty 60, 30d supply, fill #0

## 2023-03-05 MED ORDER — LOPERAMIDE HCL 2 MG PO CAPS
4.0000 mg | ORAL_CAPSULE | ORAL | Status: DC | PRN
  Administered 2023-03-05: 4 mg via ORAL
  Filled 2023-03-05: qty 2

## 2023-03-05 MED ORDER — DILTIAZEM HCL ER COATED BEADS 360 MG PO CP24
360.0000 mg | ORAL_CAPSULE | Freq: Every day | ORAL | 0 refills | Status: DC
  Filled 2023-03-05: qty 30, 30d supply, fill #0

## 2023-03-05 NOTE — TOC Progression Note (Signed)
Transition of Care [facility]) - Progression Note    Patient Details  Name: Tracie [name] MRN: [id number] Date of Birth: 1960-05-21  Transition of Care [facility]) CM/SW Contact  Tracie Foley [name], RN Phone Number: 03/05/2023, 2:52 PM  Clinical Narrative:    Case Manager discussed recommendation for home health needs with patient's husband yesterday. Confirmed that patient is not active with [facility]. Referral for HHPT/OT was called to [facility] [place] Liaison. Spoke with patient's husband this afternoon to update him. Will add to AVS.    Expected Discharge Plan: Home w Home Health Services Barriers to Discharge: Continued Medical Work up  Expected Discharge Plan and Services In-house Referral: Clinical Social Work Discharge Planning Services: CM Consult Post Acute Care Choice: Home Health Living arrangements for the past 2 months: Apartment Expected Discharge Date: 03/05/23               DME Arranged: N/A DME Agency: NA       HH Arranged: PT, OT HH Agency: [facility] Date HH Agency Contacted: 03/05/23 Time HH Agency Contacted: 1256 Representative spoke with at [facility] Agency: Amy Hyatt   Social Determinants of Health (SDOH) Interventions SDOH Screenings   Food Insecurity: No Food Insecurity (02/24/2023)  Housing: Low Risk  (02/24/2023)  Transportation Needs: No Transportation Needs (02/24/2023)  Utilities: Not At Risk (02/24/2023)  Depression (PHQ2-9): Low Risk  (02/26/2020)  Tobacco Use: Medium Risk (02/25/2023)    Readmission Risk Interventions     No data to display

## 2023-03-05 NOTE — Progress Notes (Addendum)
PHARMACY - ANTICOAGULATION CONSULT NOTE  Pharmacy Consult for heparin Indication: atrial fibrillation  Allergies  Allergen Reactions   Codeine Other (See Comments)    Unknown reaction   Crestor [Rosuvastatin] Other (See Comments)    Myalgias  Weakness    Patient Measurements: Height: 5' 10.51" (179.1 cm) Weight: (!) 140.9 kg (310 lb 10.1 oz) (post procedure bedrest) IBW/kg (Calculated) : 69.68 Heparin Dosing Weight: 104  Vital Signs: Temp: 98.4 F (36.9 C) (11/26 0403) Temp Source: Oral (11/26 0403) BP: 144/100 (11/26 0407) Pulse Rate: 100 (11/26 0407)  Labs: Recent Labs    03/02/23 1136 03/02/23 1730 03/03/23 0408 03/03/23 0408 03/04/23 0320 03/04/23 2152 03/05/23 0454 03/05/23 0800  HGB  --   --   --   --  13.5  --  13.8  --   HCT  --   --   --   --  42.7  --  44.2  --   PLT  --   --   --   --  210  --  214  --   APTT 113* 98* 79*  --   --   --   --   --   HEPARINUNFRC  --   --  0.53   < >  --  0.18* 0.24* 0.54  CREATININE  --   --   --   --  0.77  --  0.73  --    < > = values in this interval not displayed.    Estimated Creatinine Clearance: 110.1 mL/min (by C-G formula based on SCr of 0.73 mg/dL).   Medical History: Past Medical History:  Diagnosis Date   Atrial fibrillation (HCC)    Celiac artery dissection (HCC)    Diabetes mellitus (HCC)    History of DVT (deep vein thrombosis) 2010   Hyperlipidemia    Hypertension    Morbid obesity (HCC)    Assessment: 47 yoF admitted with AF started on apixaban. Pt with ongoing pain and known L1 compression so apixaban held and IR consulted for possible vertebroplasty. Pharmacy to dose IV heparin in meantime. Last apixaban dose was 11/20 am.  Vertebroplasty completed 11/25, heparin resumed postop. Heparin level therapeutic at 0.54 this am, CBC stable.  Goal of Therapy:  Heparin level 0.3-0.7 units/ml aPTT 66-102 seconds Monitor platelets by anticoagulation protocol: Yes   Plan:  Heparin 1400  units/h Daily heparin level and CBC  ADDENDUM 1418 transition back to apixaban 5mg  BID, stop heparin drip.   Fredonia Highland, PharmD, BCPS, [facility] Clinical Pharmacist [phone] Please check AMION for all [facility] Pharmacy numbers 03/05/2023

## 2023-03-05 NOTE — Progress Notes (Signed)
   Patient Name: Tracie [name] Date of Encounter: 03/05/2023 [facility] Cardiologist: Parke Poisson, MD   Interval Summary  .    No chest pain or shortness of breath.  Patient uncomfortable with back pain and muscle spasms  Vital Signs .    Vitals:   03/04/23 2039 03/04/23 2046 03/05/23 0403 03/05/23 0407  BP:  (!) 161/89 (!) 152/95 (!) 144/100  Pulse:  (!) 119 (!) 115 100  Resp: 17 18 16 17   Temp:  97.6 F (36.4 C) 98.4 F (36.9 C)   TempSrc:  Oral Oral   SpO2:  95% 93% (!) 88%  Weight:   (!) 140.9 kg   Height:       No intake or output data in the 24 hours ending 03/05/23 0903    03/05/2023    4:03 AM 03/04/2023    4:18 AM 03/03/2023    3:22 AM  Last 3 Weights  Weight (lbs) 310 lb 10.1 oz 312 lb 1.6 oz 312 lb 11.2 oz  Weight (kg) 140.9 kg 141.568 kg 141.84 kg      Telemetry/ECG    Atrial fibrillation heart rate 90 to 115 bpm no other arrhythmia - Personally Reviewed  Physical Exam .   GEN: Alert, oriented woman, morbidly obese, moderate distress secondary to back pain Neck: No JVD Cardiac: Irregularly irregular, no murmurs, rubs, or gallops.  Respiratory: Clear to auscultation bilaterally. GI: Soft, nontender, non-distended  MS: Trace bilateral pretibial edema  Assessment & Plan .     Persistent atrial fibrillation: Continue diltiazem and metoprolol succinate at current doses.  Heart rate to be expected considering her significant pain.  Currently on IV heparin.  Transition to apixaban when okay with interventional radiology.  Otherwise patient remains clinically stable.  Her atrial fibrillation is longstanding.  She has declined cardioversion in I doubt this would hold without antiarrhythmic drug therapy considering the duration of atrial fibrillation.  Continue strategy of rate control and anticoagulation.  Cardiology will sign off.  Will arrange OP follow-up. Please contact me if any other issues arise. Thx  For questions or updates,  please contact [facility] Please consult [email or web address] for contact info under        Signed, Tonny Bollman, MD

## 2023-03-05 NOTE — Progress Notes (Signed)
Occupational Therapy Treatment Patient Details Name: Tracie [name] MRN: [id number] DOB: [birth year] Today's Date: 03/05/2023   History of present illness The pt is a 62 yo female presenting 11/17 for back spasms with pain down her R leg since 11/3. Pt found to be in afib with RVR and x-ray showed age-indeterminate L1 compression fx and possible metestatic dx in spine which is in process of being worked up. PMH includes: HTN, HLD, DM II, afib, DVT, myalgias, and morbid obesity (BMI >40).   OT comments  Extended time needed to complete therapy sessions with pending DC d/t initial decreased receptiveness to education re: orders to wear TLSO brace, back precautions, pain premedication and DC location based on functional assessments. Once pt agreeable, pt able to demo bed mobility and mobility using Rollator with Min A. Pt able to perform toileting hygiene though will likely need light assist with other LB ADLs given back pain/precautions. Pt's spouse present and engaged. Educated re: BSC over toilet (demonstrated how to do so), TLSO brace mgmt, tub transfer bench options and where assist may be needed. Pt agreeable for [facility] therapy services at home but not agreeable to a postacute rehab stay.       If plan is discharge home, recommend the following:  A little help with walking and/or transfers;A little help with bathing/dressing/bathroom;Assistance with cooking/housework;Assist for transportation;Help with stairs or ramp for entrance   Equipment Recommendations  None recommended by OT    Recommendations for Other Services      Precautions / Restrictions Precautions Precautions: Fall;Back Precaution Booklet Issued: Yes (comment) Precaution Comments: watch HR Restrictions Weight Bearing Restrictions: No       Mobility Bed Mobility Overal bed mobility: Needs Assistance Bed Mobility: Sit to Supine, Supine to Sit     Supine to sit: Min assist, HOB elevated Sit to supine: Min assist    General bed mobility comments: pt adamantly declined HOB flat for log rolling (though would not say whether she was planning to sleep in recliner vs bed in order to simulate home setup). Requested HOB kept elevated, simulated exiting recliner. Min A for LE mgmt only    Transfers Overall transfer level: Needs assistance Equipment used: Rollator (4 wheels) Transfers: Sit to/from Stand Sit to Stand: Contact guard assist           General transfer comment: from slightly higher bed surface, able to stand from bed and later lower toilet. Excessive trunk flexion noted     Balance Overall balance assessment: Needs assistance Sitting-balance support: Feet supported Sitting balance-Leahy Scale: Good     Standing balance support: Bilateral upper extremity supported Standing balance-Leahy Scale: Poor                             ADL either performed or assessed with clinical judgement   ADL Overall ADL's : Needs assistance/impaired                   Upper Body Dressing Details (indicate cue type and reason): Total A to don/doff TLSO brace. Educated spouse on how to assist with spouse able to return Cabin crew: Minimal assistance;Contact guard assist;Ambulation;Rollator (4 wheels) Statistician Details (indicate cue type and reason): unable to locate bari [facility] to place over toilet - nursing secretary aware. Educated and demonstrated to spouse at end of session how to adjust [facility] to place over toilet at home (already has bari [facility]). use of  grab bar to stand from low toilet today. Toileting- Clothing Manipulation and Hygiene: Supervision/safety;Sitting/lateral lean Toileting - Clothing Manipulation Details (indicate cue type and reason): cues to avoid excessive twisting though pt reports she has a certain way she has to reach to wipe. encouraged trial of wet wipes at home to increase ease of task     Functional mobility during ADLs: Contact guard  assist;Minimal assistance;Rollator (4 wheels);Cueing for sequencing;Cueing for safety General ADL Comments: Extended time needed to educate on back precautions, adjsutments for ADLs, rec for tub bench (provided handout with husband planning to locate)    Extremity/Trunk Assessment Upper Extremity Assessment Upper Extremity Assessment: Overall WFL for tasks assessed   Lower Extremity Assessment Lower Extremity Assessment: Defer to PT evaluation        Vision   Vision Assessment?: No apparent visual deficits   Perception     Praxis      Cognition Arousal: Alert Behavior During Therapy: Flat affect, Impulsive Overall Cognitive Status: Impaired/Different from baseline Area of Impairment: Safety/judgement, Awareness, Problem solving                         Safety/Judgement: Decreased awareness of deficits, Decreased awareness of safety Awareness: Emergent Problem Solving: Requires verbal cues General Comments: initially not receptive to education about back precautions, need to wear brace per MD orders and need to demonstrate mobility due to ready for DC today (though declining SNF). Extended time also needed to educate that pt had in fact had pain meds prior to therapy attempt and was documented.        Exercises      Shoulder Instructions       General Comments Husband at bedside    Pertinent Vitals/ Pain       Pain Assessment Pain Assessment: Faces Faces Pain Scale: Hurts little more Pain Location: back spasms Pain Descriptors / Indicators: Spasm, Grimacing, Discomfort, Moaning Pain Intervention(s): Limited activity within patient's tolerance, Monitored during session, Premedicated before session  Home Living                                          Prior Functioning/Environment              Frequency  Min 1X/week        Progress Toward Goals  OT Goals(current goals can now be found in the care plan section)  Progress  towards OT goals: Progressing toward goals  Acute Rehab OT Goals Patient Stated Goal: go home, pain control OT Goal Formulation: With patient Time For Goal Achievement: 03/11/23 Potential to Achieve Goals: Good ADL Goals Pt Will Perform Grooming: with contact guard assist;standing Pt Will Perform Lower Body Bathing: with min assist;with adaptive equipment;sit to/from stand Pt Will Perform Lower Body Dressing: with min assist;with adaptive equipment;sit to/from stand Pt Will Transfer to Toilet: with contact guard assist;bedside commode Pt Will Perform Toileting - Clothing Manipulation and hygiene: with contact guard assist;sit to/from stand Additional ADL Goal #1: Pt will toletate sitting EOB for 5 minutes with supervision in prep for adls EOB  Plan      Co-evaluation    PT/OT/SLP Co-Evaluation/Treatment: Yes Reason for Co-Treatment: Complexity of the patient's impairments (multi-system involvement);Necessary to address cognition/behavior during functional activity PT goals addressed during session: Mobility/safety with mobility;Strengthening/ROM OT goals addressed during session: ADL's and self-care      AM-PAC OT "  6 Clicks" Daily Activity     Outcome Measure   Help from another person eating meals?: None Help from another person taking care of personal grooming?: None Help from another person toileting, which includes using toliet, bedpan, or urinal?: A Little Help from another person bathing (including washing, rinsing, drying)?: A Lot Help from another person to put on and taking off regular upper body clothing?: A Little Help from another person to put on and taking off regular lower body clothing?: A Lot 6 Click Score: 18    End of Session Equipment Utilized During Treatment: Gait belt;Rollator (4 wheels)  OT Visit Diagnosis: Unsteadiness on feet (R26.81);Pain Pain - Right/Left: Left Pain - part of body: Leg   Activity Tolerance Patient limited by pain   Patient  Left in bed;with call bell/phone within reach;with family/visitor present   Nurse Communication Mobility status        Time: 1610-9604 OT Time Calculation (min): 60 min  Charges: OT General Charges $OT Visit: 1 Visit OT Treatments $Self Care/Home Management : 8-22 mins $Therapeutic Activity: 8-22 mins  Bradd Canary, OTR/L Acute Rehab Services Office: [phone]   Lorre Munroe 03/05/2023, 12:30 PM

## 2023-03-05 NOTE — Evaluation (Signed)
Physical Therapy Re-Evaluation Patient Details Name: Tracie [name] MRN: [id number] DOB: [birth year] Today's Date: 03/05/2023  History of Present Illness  The pt is a 62 yo female presenting 11/17 for back spasms with pain down her R leg since 11/3. Pt found to be in afib with RVR and x-ray showed age-indeterminate L1 compression fx and possible metestatic dx in spine which is in process of being worked up.s/p kyphoplasty 11/25  PMH includes: HTN, HLD, DM II, afib, DVT, myalgias, and morbid obesity (BMI >40).  Clinical Impression  Pt premedicated prior to session, reporting she does not want to move because her pain is finally under control, but pt also adamant about discharge home today. Educated on need to demonstrate safe mobilization to be able to discharge. With increased encouragement from PT/OT and husband, pt agreeable to work with therapy. Pt provided education on no bending, lifting or twisting, which needs to be reinforced throughout session with little to no carryover, despite increased back spasms with bending. Pt initially does not want to use brace reporting it was too painful, also does not want to perform exit from flattened bed. Reports she will sleep on love seat in living room, attempt to simulate transfer. Pt requiring minA for management of LE with bed mobility. Once EoB pt reluctantly agreeable to donning brace, and modified for better fit by rehab staff. Pt is ultimately contact guard for transfers from bed and low toilet, as well as for ambulation in hallway. At end of session pt is agreeable to [facility] services. PT will continue to follow acutely. Pt wanting discharge home today.          If plan is discharge home, recommend the following: A little help with walking and/or transfers;Assistance with cooking/housework;Help with stairs or ramp for entrance;A little help with bathing/dressing/bathroom   Can travel by private vehicle   No    Equipment Recommendations Other  (comment) (Tub bench)  Recommendations for Other Services       Functional Status Assessment Patient has had a recent decline in their functional status and demonstrates the ability to make significant improvements in function in a reasonable and predictable amount of time.     Precautions / Restrictions Precautions Precautions: Fall;Back Precaution Booklet Issued: Yes (comment) Precaution Comments: watch HR Restrictions Weight Bearing Restrictions: No      Mobility  Bed Mobility Overal bed mobility: Needs Assistance Bed Mobility: Sit to Supine, Supine to Sit     Supine to sit: Min assist, HOB elevated Sit to supine: Min assist   General bed mobility comments: pt adamantly declined HOB flat for log rolling (though would not say whether she was planning to sleep in recliner vs bed in order to simulate home setup). Requested HOB kept elevated, simulated exiting recliner. Min A for LE mgmt only    Transfers Overall transfer level: Needs assistance Equipment used: Rollator (4 wheels) Transfers: Sit to/from Stand Sit to Stand: Contact guard assist           General transfer comment: from slightly higher bed surface, able to stand from bed and later lower toilet. Excessive trunk flexion noted    Ambulation/Gait Ambulation/Gait assistance: Contact guard assist, Supervision Gait Distance (Feet): 50 Feet Assistive device: Rollator (4 wheels) Gait Pattern/deviations: Step-through pattern, Trunk flexed, Antalgic, Wide base of support Gait velocity: decr Gait velocity interpretation: <1.31 ft/sec, indicative of household ambulator   General Gait Details: ambulated in the hallway, constant cuing for upright posture and management of Rollator  Balance Overall balance assessment: Needs assistance Sitting-balance support: Feet supported Sitting balance-Leahy Scale: Good     Standing balance support: Bilateral upper extremity supported Standing balance-Leahy Scale:  Poor Standing balance comment: UE support and CGA for static standing                             Pertinent Vitals/Pain Pain Assessment Pain Assessment: Faces Faces Pain Scale: Hurts little more Pain Location: back spasms Pain Descriptors / Indicators: Spasm, Grimacing, Discomfort, Moaning Pain Intervention(s): Limited activity within patient's tolerance, Monitored during session, Premedicated before session, Repositioned, Other (comment) (educated on back precautions to keep from back spasms from occurring)      Extremity/Trunk Assessment   Upper Extremity Assessment Upper Extremity Assessment: Overall WFL for tasks assessed    Lower Extremity Assessment Lower Extremity Assessment: RLE deficits/detail;LLE deficits/detail RLE: Unable to fully assess due to pain LLE: Unable to fully assess due to pain    Cervical / Trunk Assessment Cervical / Trunk Assessment: Back Surgery Cervical / Trunk Exceptions: s/p 11/25 kyphoplasty  Communication   Communication Communication: No apparent difficulties  Cognition Arousal: Alert Behavior During Therapy: Flat affect, Impulsive Overall Cognitive Status: Impaired/Different from baseline Area of Impairment: Safety/judgement, Awareness, Problem solving                         Safety/Judgement: Decreased awareness of deficits, Decreased awareness of safety Awareness: Emergent Problem Solving: Requires verbal cues General Comments: initially not receptive to education about back precautions, need to wear brace per MD orders and need to demonstrate mobility due to ready for DC today (though declining SNF). Extended time also needed to educate that pt had in fact had pain meds prior to therapy attempt and was documented.        General Comments General comments (skin integrity, edema, etc.): husband at bedside reporting reluctance in being able to help her at home        Assessment/Plan    PT Assessment Patient  needs continued PT services  PT Problem List Decreased strength;Decreased range of motion;Decreased activity tolerance;Decreased balance;Decreased mobility;Decreased cognition;Decreased safety awareness;Decreased knowledge of precautions;Pain       PT Treatment Interventions DME instruction;Gait training;Stair training;Functional mobility training;Therapeutic activities;Therapeutic exercise;Balance training;Patient/family education    PT Goals (Current goals can be found in the Care Plan section)  Acute Rehab PT Goals Patient Stated Goal: have no pain PT Goal Formulation: With patient Time For Goal Achievement: 03/11/23 Potential to Achieve Goals: Fair    Frequency Min 1X/week     Co-evaluation PT/OT/SLP Co-Evaluation/Treatment: Yes Reason for Co-Treatment: Complexity of the patient's impairments (multi-system involvement);Necessary to address cognition/behavior during functional activity PT goals addressed during session: Mobility/safety with mobility;Strengthening/ROM OT goals addressed during session: ADL's and self-care       AM-PAC PT "6 Clicks" Mobility  Outcome Measure Help needed turning from your back to your side while in a flat bed without using bedrails?: A Little Help needed moving from lying on your back to sitting on the side of a flat bed without using bedrails?: A Little Help needed moving to and from a bed to a chair (including a wheelchair)?: A Little Help needed standing up from a chair using your arms (e.g., wheelchair or bedside chair)?: A Little Help needed to walk in hospital room?: A Little Help needed climbing 3-5 steps with a railing? : Total 6 Click Score: 16    End of  Session Equipment Utilized During Treatment: Back brace Activity Tolerance: Patient limited by pain;Other (comment) (decreased adherance to back precautions) Patient left: in bed;with call bell/phone within reach;with bed alarm set;with family/visitor present Nurse Communication:  Mobility status PT Visit Diagnosis: Other abnormalities of gait and mobility (R26.89);Pain;Muscle weakness (generalized) (M62.81);Difficulty in walking, not elsewhere classified (R26.2) Pain - part of body:  (back)    Time: 1610-9604 PT Time Calculation (min) (ACUTE ONLY): 43 min   Charges:   PT Evaluation $PT Re-evaluation: 1 Re-eval   PT General Charges $$ ACUTE PT VISIT: 1 Visit         Vivyan Biggers B. Beverely Risen PT, DPT Acute Rehabilitation Services Please use secure chat or  Call Office [phone]   [place]on Alas [facility] 03/05/2023, 1:43 PM

## 2023-03-05 NOTE — Discharge Summary (Signed)
Physician Discharge Summary  Roizy Dexheimer ZOX:[id number] DOB: 03-May-1960 DOA: 02/24/2023  PCP: Aliene Beams, MD  Admit date: 02/24/2023 Discharge date: 03/05/2023  Admitted From: Home Disposition: Home  Recommendations for Outpatient Follow-up:  Follow up with PCP in 1-2 weeks Follow-up with cardiology/A-fib clinic as scheduled below Follow-up with surgery team post kyphoplasty, follow-up postop recommendations and guidelines as below  Home Health: Home health PT OT Equipment/Devices: No new equipment  Discharge Condition: Stable CODE STATUS: Full Diet recommendation: Low-salt low-fat low-carb diet  Brief/Interim Summary: Tracie [name] is an 62 y.o. female past medical history of hypertension diabetes mellitus type 2, chronic atrial fibrillation and history of DVT on apixaban, [name] seen in the ED in 02/21/2023 for back pain diagnosed with sciatica sent home with medications, saw her PCP but was given muscle relaxer and is steroid taper comes to the ED complaining of back pain was found to be in A-fib with RVR.  She says she was getting better but the pain came back worst no neurological symptoms. 2D echo was done that showed an EF of 60% no wall motion abnormality.  Patient admitted as above with A-fib notably in RVR initially symptomatic, initially thought to be provoked by uncontrolled pain due to recent L1 compression fracture and chronic osteoporosis.  Given patient's inability to ambulate without excruciating pain requiring IV narcotics patient was evaluated by IR status post kyphoplasty on 03/04/2023.  Patient tolerated procedure quite well, able to ambulate today with PT who recommend ongoing physical therapy at home through home health.  Patient otherwise stable and agreeable for discharge home.  Outpatient follow-up with cardiology and surgery as scheduled.  Otherwise resume home medications including Eliquis.   Discharge Diagnoses:  Principal Problem:   Atrial  fibrillation with rapid ventricular response (HCC) Active Problems:   Lumbar pain   Hypokalemia   Leukocytosis   Controlled type 2 diabetes mellitus without complication, without long-term current use of insulin (HCC)   Essential hypertension, benign   Hyperlipidemia  Atrial fibrillation with RVR (HCC): Rate controlled Rate controlled on diltiazem 300 and metoprolol 100 per cardiology ChadsVasc > 4. Cardiology recommending outpatient sleep study  Lumbar pain/L1 compression fracture/osteoporosis: Repeat MRI with contrast showed body lesions are not enhancing or likely to be benign. IR following, L1 kyphoplasty this morning, tolerated well, postop care and PT recommendations per surgery PT OT recommending home health   Osteoporosis: Follow-up PCP as an outpatient will need to be on calcium, vitamin D and biphosphonate's   Hypokalemia/hypomagnesemia: Stable   Leukocytosis, resolved Likely reactive due to steroids, resolved Remains afebrile - no signs/symptoms of infection   Controlled diabetes mellitus type 2: Resumed statin Jardiance. Continue long-acting insulin plus sliding scale CBGs ACH S, A1c of 7.0. Blood glucose well-controlled.   Essential hypertension: Currently on Cardizem, losartan and metoprolol for blood pressure still elevated.   Hyperlipidemia: Continue statins.   Lower extremity edema/mild RV dysfunction: Suspect multifactorial in the setting of obesity A-fib, probably undiagnosed such as sleep apnea. Cardiology recommended sleep study as an outpatient.  Discharge Instructions  Discharge Instructions     Amb referral to AFIB Clinic   Complete by: As directed    Face-to-face encounter (required for Medicare/Medicaid patients)   Complete by: As directed    I [name] certify that this patient is under my care and that I, or a nurse practitioner or physician's assistant working with me, had a face-to-face encounter that meets the physician  face-to-face encounter requirements with this patient on  03/05/2023. The encounter with the patient was in whole, or in part for the following medical condition(s) which is the primary reason for home health care (List medical condition): Ambulatory dysfunction, kyphoplasty   The encounter with the patient was in whole, or in part, for the following medical condition, which is the primary reason for home health care: Ambulatory dysfunction, kyphoplasty   I certify that, based on my findings, the following services are medically necessary home health services: Physical therapy   Reason for Medically Necessary Home Health Services:  Skilled Nursing- Change/Decline in Patient Status Therapy- Investment banker, operational, Patent examiner Therapy- Instruction on use of Assistive Device for Ambulation on all Surfaces Therapy- Home Adaptation to Facilitate Safety     My clinical findings support the need for the above services: Unable to leave home safely without assistance and/or assistive device   Further, I certify that my clinical findings support that this patient is homebound due to: Unable to leave home safely without assistance   Home Health   Complete by: As directed    To provide the following care/treatments:  PT OT        Allergies as of 03/05/2023       Reactions   Codeine Other (See Comments)   Unknown reaction   Crestor [rosuvastatin] Other (See Comments)   Myalgias  Weakness        Medication List     STOP taking these medications    amLODipine 10 MG tablet Commonly known as: NORVASC   apixaban 5 MG Tabs tablet Commonly known as: Eliquis Replaced by: Apixaban Starter Pack (10mg  and 5mg )   aspirin EC 81 MG tablet   ibuprofen 800 MG tablet Commonly known as: ADVIL   predniSONE 10 MG tablet Commonly known as: DELTASONE       TAKE these medications    Apixaban Starter Pack (10mg  and 5mg ) Commonly known as: ELIQUIS STARTER PACK Take as directed on  package: start with two-5mg  tablets twice daily for 7 days. On day 8, switch to one-5mg  tablet twice daily. Replaces: apixaban 5 MG Tabs tablet   celecoxib 100 MG capsule Commonly known as: CELEBREX Take 100 mg by mouth daily.   cyclobenzaprine 5 MG tablet Commonly known as: FLEXERIL Take 1 tablet (5 mg total) by mouth 3 (three) times daily as needed for muscle spasms. What changed:  medication strength See the new instructions.   diltiazem 360 MG 24 hr capsule Commonly known as: CARDIZEM CD Take 1 capsule (360 mg total) by mouth daily. Start taking on: March 06, 2023   empagliflozin 25 MG Tabs tablet Commonly known as: JARDIANCE Take 25 mg by mouth daily.   HYDROcodone-acetaminophen 5-325 MG tablet Commonly known as: NORCO/VICODIN Take 1-2 tablets by mouth every 6 (six) hours as needed for moderate pain (pain score 4-6). What changed:  how much to take reasons to take this   lidocaine 5 % Commonly known as: Lidoderm Place 1 patch onto the skin daily. Remove & Discard patch within 12 hours or as directed by MD   losartan 100 MG tablet Commonly known as: COZAAR TAKE 1 TABLET(100 MG) BY MOUTH DAILY   metoprolol succinate 100 MG 24 hr tablet Commonly known as: TOPROL-XL Take 1 tablet (100 mg total) by mouth 2 (two) times daily. Take with or immediately following a meal. What changed:  medication strength how much to take when to take this   pantoprazole 40 MG tablet Commonly known as: PROTONIX Take 1 tablet (40  mg total) by mouth 2 (two) times daily.   vitamin B-12 500 MCG tablet Commonly known as: CYANOCOBALAMIN Take 500 mcg by mouth daily.        Allergies  Allergen Reactions   Codeine Other (See Comments)    Unknown reaction   Crestor [Rosuvastatin] Other (See Comments)    Myalgias  Weakness    Consultations: Cardiology, interventional radiology  Procedures/Studies: IR KYPHO LUMBAR INC FX REDUCE BONE BX UNI/BIL CANNULATION  INC/IMAGING  Result Date: 03/05/2023 INDICATION: Severe low back pain secondary to compression fracture at L1 caused by a fall. EXAM: BALLOON KYPHOPLASTY AT L1 COMPARISON:  MRI of the lumbosacral spine November 18 and February 26, 2023. MEDICATIONS: As antibiotic prophylaxis, Ancef 2 g IV was ordered pre-procedure and administered intravenously within 1 hour of incision. All current medications are in the EMR and have been reviewed as part of this encounter. ANESTHESIA/SEDATION: Moderate (conscious) sedation was employed during this procedure. A total of Versed 2 mg and Fentanyl 50 mcg was administered intravenously by the radiology nurse. Total intra-service moderate Sedation Time: 28 minutes. The patient's level of consciousness and vital signs were monitored continuously by radiology nursing throughout the procedure under my direct supervision. FLUOROSCOPY: Radiation Exposure Index (as provided by the fluoroscopic device): 14 minutes 18 seconds. 1734 mGy COMPLICATIONS: None immediate. PROCEDURE: Following a full explanation of the procedure along with the potential associated complications, an informed witnessed consent was obtained. The patient was placed prone on the fluoroscopic table. The skin overlying the thoracolumbar region was then prepped and draped in the usual sterile fashion. The both pedicles at L1 were infiltrated with 0.25% bupivacaine followed by the advancement of an 11-gauge Jamshidi needle through each pedicle into the posterior one-third at L1. These were then exchanged for a Kyphon advanced osteo introducer system comprised of a working cannula and a Kyphon osteo drill. This combinations were then advanced over a Kyphon osteo bone pin until the tips of the Kyphon osteo drill were in the posterior third at L1. At this time, the bone pin was removed. In a medial trajectory, the combination was advanced until the tips of the working cannulae were inside the posterior one-third at L1. The  osteo drills were  removed. Through the working cannulae, Kyphon inflatable bone tamps 20 x 3 were advanced and positioned with the distal marker 5 mm from the anterior aspect of L1. Crossing of the midline was seen on the AP projection. At this time, the balloons were expanded using contrast via a Kyphon inflation syringe device via microtubing. Inflations were continued until there was apposition with the superior and the inferior endplates. At this time, methylmethacrylate mixture was reconstituted with Tobramycin in the Kyphon bone mixing device system. This was then loaded onto the Kyphon bone fillers. The balloons were deflated and removed followed by the instillation of 5 bone filler equivalents of methylmethacrylate mixture at L1 with excellent filling in the AP and lateral projections. No extravasation was noted in the disk spaces or posteriorly into the spinal canal. No epidural venous contamination was seen. The working cannulae and the bone fillers were then retrieved and removed. Hemostasis was achieved at the skin entry sites. Patient tolerated the procedure well. Patient was then returned to the floor in stable condition. IMPRESSION: 1. Status post vertebral body augmentation using balloon kyphoplasty at L1 as described without event. If the patient has known osteoporosis, recommend treatment as clinically indicated. If the patient's bone density status is unknown, DEXA scan is recommended. Electronically  Signed   By: Julieanne Cotton M.D.   On: 03/05/2023 08:06   MR LUMBAR SPINE W CONTRAST  Result Date: 02/26/2023 CLINICAL DATA:  Follow-up L3 and L4 vertebral body lesions seen on yesterday's lumbar spine MRI. Acute L1 compression fracture. EXAM: MRI LUMBAR SPINE WITH CONTRAST TECHNIQUE: Multiplanar and multiecho pulse sequences of the lumbar spine were obtained with intravenous contrast. CONTRAST:  10mL GADAVIST GADOBUTROL 1 MMOL/ML IV SOLN COMPARISON:  MRI lumbar spine from yesterday. Lumbar  spine x-rays dated February 24, 2023. FINDINGS: Segmentation:  Standard. Alignment:  Physiologic. Vertebrae: Unchanged acute to subacute L1 inferior endplate compression fracture with 5 mm retropulsion of the posteroinferior cortex. L3 and L4 vertebral body lesion seen on yesterday's MRI do not enhance. No suspicious bone lesion. Mild degenerative endplate enhancement L4-L5. Conus medullaris and cauda equina: Conus extends to the L1 level. Conus and cauda equina appear normal. No abnormal intrathecal enhancement. Paraspinal and other soft tissues: 3.7 cm simple cyst in the upper pole of the left kidney. No follow-up imaging is recommended. Otherwise negative. Disc levels: Similar mild multilevel degenerative changes as described on yesterday's MRI. No stenosis. IMPRESSION: 1. L3 and L4 vertebral body lesions seen on yesterday's MRI do not enhance and are likely benign. Consider follow-up MRI in 3-6 months to evaluate for interval change. 2. Unchanged L1 inferior endplate compression fracture. Electronically Signed   By: Obie Dredge M.D.   On: 02/26/2023 16:14   ECHOCARDIOGRAM COMPLETE  Result Date: 02/25/2023    ECHOCARDIOGRAM REPORT   Patient Name:   Tracie [name] Mosco Date of Exam: 02/25/2023 Medical Rec #:  [id number]           Height:       70.5 in Accession #:    [id number]          Weight:       319.0 lb Date of Birth:  1960/09/25           BSA:          2.559 m Patient Age:    62 years            BP:           123/74 mmHg Patient Gender: F                   HR:           100 bpm. Exam Location:  Inpatient Procedure: 2D Echo, Cardiac Doppler, Color Doppler and Intracardiac            Opacification Agent Indications:    A-fib  History:        Patient has no prior history of Echocardiogram examinations.                 Arrythmias:Atrial Fibrillation; Risk Factors:Hypertension,                 Dyslipidemia, Diabetes and Former Smoker.  Sonographer:    Dondra Prader RVT RCS Referring Phys: [phone] [facility]  Sonographer Comments: Technically challenging study due to limited acoustic windows, Technically difficult study due to poor echo windows and suboptimal apical window. Image acquisition challenging due to patient body habitus and Image acquisition challenging due to respiratory motion. Patient supine; limited and poor windows IMPRESSIONS  1. Left ventricular ejection fraction, by estimation, is 60 to 65%. The left ventricle has normal function. The left ventricle has no regional wall motion abnormalities. There is mild left ventricular hypertrophy.  2. Right ventricular systolic  function reduced. The right ventricular size is grossly normal. There is normal pulmonary artery systolic pressure.  3. Left atrial size was mildly dilated.  4. Right atrial size was moderately dilated.  5. The mitral valve is normal in structure. Mild mitral valve regurgitation. No evidence of mitral stenosis.  6. The aortic valve was not well visualized. Aortic valve regurgitation is trivial. No aortic stenosis is present.  7. Aortic dilatation noted. There is dilatation of the ascending aorta, measuring 39 mm.  8. The inferior vena cava is dilated in size with >50% respiratory variability, suggesting right atrial pressure of 8 mmHg. Comparison(s): No prior Echocardiogram. FINDINGS  Left Ventricle: Left ventricular ejection fraction, by estimation, is 60 to 65%. The left ventricle has normal function. The left ventricle has no regional wall motion abnormalities. Definity contrast agent was given IV to delineate the left ventricular  endocardial borders. The left ventricular internal cavity size was normal in size. There is mild left ventricular hypertrophy. Left ventricular diastolic function could not be evaluated due to atrial fibrillation. Right Ventricle: The right ventricular size is grossly normal. Right vetricular wall thickness was not well visualized. Right ventricular systolic function reduced. There is normal pulmonary  artery systolic pressure. The tricuspid regurgitant velocity is  2.52 m/s, and with an assumed right atrial pressure of 8 mmHg, the estimated right ventricular systolic pressure is 33.4 mmHg. Left Atrium: Left atrial size was mildly dilated. Right Atrium: Right atrial size was moderately dilated. Pericardium: There is no evidence of pericardial effusion. Mitral Valve: The mitral valve is normal in structure. Mild mitral valve regurgitation. No evidence of mitral valve stenosis. Tricuspid Valve: The tricuspid valve is normal in structure. Tricuspid valve regurgitation is mild . No evidence of tricuspid stenosis. Aortic Valve: The aortic valve was not well visualized. Aortic valve regurgitation is trivial. No aortic stenosis is present. Aortic valve mean gradient measures 2.0 mmHg. Aortic valve peak gradient measures 4.1 mmHg. Aortic valve area, by VTI measures 3.21 cm. Pulmonic Valve: The pulmonic valve was not well visualized. Pulmonic valve regurgitation is not visualized. No evidence of pulmonic stenosis. Aorta: The aortic root is normal in size and structure and aortic dilatation noted. There is dilatation of the ascending aorta, measuring 39 mm. Venous: The inferior vena cava is dilated in size with greater than 50% respiratory variability, suggesting right atrial pressure of 8 mmHg. IAS/Shunts: The atrial septum is grossly normal.  LEFT VENTRICLE PLAX 2D LVIDd:         5.00 cm   Diastology LVIDs:         2.90 cm   LV e' medial:    13.10 cm/s LV PW:         1.30 cm   LV E/e' medial:  6.1 LV IVS:        1.30 cm   LV e' lateral:   13.20 cm/s LVOT diam:     2.30 cm   LV E/e' lateral: 6.1 LV SV:         55 LV SV Index:   21 LVOT Area:     4.15 cm  RIGHT VENTRICLE         IVC TAPSE (M-mode): 1.3 cm  IVC diam: 2.40 cm LEFT ATRIUM              Index        RIGHT ATRIUM           Index LA diam:        4.90 cm  1.91 cm/m   RA Area:     28.80 cm LA Vol (A2C):   121.0 ml 47.29 ml/m  RA Volume:   97.30 ml  38.02 ml/m  LA Vol (A4C):   83.6 ml  32.67 ml/m LA Biplane Vol: 101.0 ml 39.47 ml/m  AORTIC VALVE                    PULMONIC VALVE AV Area (Vmax):    3.31 cm     PV Vmax:       0.76 m/s AV Area (Vmean):   3.07 cm     PV Peak grad:  2.3 mmHg AV Area (VTI):     3.21 cm AV Vmax:           100.82 cm/s AV Vmean:          69.600 cm/s AV VTI:            0.170 m AV Peak Grad:      4.1 mmHg AV Mean Grad:      2.0 mmHg LVOT Vmax:         80.22 cm/s LVOT Vmean:        51.380 cm/s LVOT VTI:          0.132 m LVOT/AV VTI ratio: 0.77  AORTA Ao Root diam: 3.70 cm Ao Asc diam:  3.90 cm MITRAL VALVE               TRICUSPID VALVE MV Area (PHT): 5.50 cm    TR Peak grad:   25.4 mmHg MV Decel Time: 138 msec    TR Vmax:        252.00 cm/s MV E velocity: 80.20 cm/s                            SHUNTS                            Systemic VTI:  0.13 m                            Systemic Diam: 2.30 cm Sunit Tolia Electronically signed by Tessa Lerner Signature Date/Time: 02/25/2023/3:20:05 PM    Final    MR LUMBAR SPINE WO CONTRAST  Result Date: 02/25/2023 CLINICAL DATA:  Low back pain, symptoms persist with > 6 wks treatment. EXAM: MRI LUMBAR SPINE WITHOUT CONTRAST TECHNIQUE: Multiplanar, multisequence MR imaging of the lumbar spine was performed. No intravenous contrast was administered. COMPARISON:  Lumbar spine radiographs 02/24/2023. FINDINGS: Segmentation: Conventional numbering is assumed with 5 non-rib-bearing, lumbar type vertebral bodies. Alignment:  Normal. Vertebrae: Inferior endplate compression fracture of the L1 vertebral body with moderate anterior height loss, associated marrow edema and 5 mm retropulsion of the posterior cortex. Irregular T1 hypointense and T2 hyperintense lesions in the L3 and L4 vertebral bodies (sagittal images 9 series 4 and 3). Modic type 1 degenerative endplate marrow signal changes at L4-5. Conus medullaris and cauda equina: Conus extends to the L1 level. Conus and cauda equina appear normal. Paraspinal  and other soft tissues: Mild fatty atrophy of the paraspinal muscles. Disc levels: T12-L1:  Small right central disc protrusion. L1-L2:  Mild bilateral facet arthropathy. L2-L3: Small disc bulge and mild bilateral facet arthropathy. No spinal canal stenosis or neural foraminal narrowing. L3-L4: Small disc bulge and mild bilateral facet arthropathy. No spinal canal stenosis  or neural foraminal narrowing. L4-L5: Small disc bulge and mild bilateral facet arthropathy. No spinal canal stenosis or neural foraminal narrowing. L5-S1:  Mild bilateral facet arthropathy. IMPRESSION: 1. Acute/subacute inferior endplate compression fracture of the L1 vertebral body with moderate anterior height loss and 5 mm retropulsion of the posterior cortex. 2. Irregular T1 hypointense and T2 hyperintense lesions in the L3 and L4 vertebral bodies, concerning for possible metastatic disease. Recommend further evaluation with contrast-enhanced lumbar spine MRI. 3. Mild multilevel lumbar spondylosis without spinal canal stenosis or neural foraminal narrowing. Electronically Signed   By: Orvan Falconer M.D.   On: 02/25/2023 09:57   DG Lumbar Spine 2-3 Views  Result Date: 02/24/2023 CLINICAL DATA:  620110 Intractable back pain 620110. EXAM: LUMBAR SPINE - 2-3 VIEW COMPARISON:  12/02/2020. FINDINGS: There are 5 nonrib-bearing lumbar vertebrae. There is loss of lumbar lordosis, which may be on the basis of positioning or due to muscle spasm. There is focal kyphosis at L1 level due to moderate anterior wedging deformity of L1 vertebrae, which is new since the prior study. No significant retropulsion or spinal canal compromise. No spondylolisthesis. Moderate anterior wedging deformity of L1 vertebrae. Remaining vertebral body heights are maintained. No aggressive osseous lesion. Mild multilevel degenerative changes in the form of facet arthropathy and marginal osteophyte formation. Sacroiliac joints are symmetric. Visualized soft tissues are  within normal limits. IMPRESSION: *Age indeterminate moderate anterior wedging deformity of L1 vertebra, new since the prior study from 2022. No significant retropulsion or spinal canal compromise. Correlate clinically to determine the need for additional imaging with MRI lumbar spine. Electronically Signed   By: Jules Schick M.D.   On: 02/24/2023 11:46   DG HIP UNILAT WITH PELVIS 1V RIGHT  Result Date: 02/24/2023 CLINICAL DATA:  Lower back pain radiating to right hip EXAM: DG HIP (WITH OR WITHOUT PELVIS) 1V RIGHT COMPARISON:  12/02/2020 FINDINGS: Frontal view of the pelvis and frogleg lateral view of the right hip are obtained. On the frontal view of the pelvis the bilateral greater trochanters are excluded by collimation. There are no acute displaced fractures identified on this examination limited by technique and patient body habitus. There is severe bilateral hip osteoarthritis, with severe joint space narrowing, marginal osteophyte formation, and bony remodeling of the femoral head and acetabuli bilaterally. Progressive changes are seen on the right since prior study. Sacroiliac joints are normal. IMPRESSION: 1. Study limited by technique and body habitus. 2. Severe bilateral hip osteoarthritis, which has progressed on the right since prior study. No prior imaging of the left hip. 3. No evidence of displaced fracture. Electronically Signed   By: Sharlet Salina M.D.   On: 02/24/2023 11:45   DG Chest Portable 1 View  Result Date: 02/24/2023 CLINICAL DATA:  Heart palpitations. EXAM: PORTABLE CHEST 1 VIEW COMPARISON:  PA chest 01/04/2021 FINDINGS: There is moderate to severe cardiomegaly increased from 01/04/2009. Central vessels are normal caliber. There is no overt edema. The lungs are clear. There is no substantial pleural effusion. The mediastinum is stable. Thoracic cage is intact. IMPRESSION: Moderate to severe cardiomegaly increased from 01/04/2009. No overt edema or pleural effusion. Normal  caliber vascular markings. Electronically Signed   By: Almira Bar M.D.   On: 02/24/2023 06:02     Subjective: No acute issues or events overnight, postop back pain currently well-controlled otherwise denies nausea vomiting diarrhea constipation headache fevers chills or chest pain   Discharge Exam: Vitals:   03/05/23 0407 03/05/23 1314  BP: (!) 144/100 (!) 112/57  Pulse: 100 92  Resp: 17   Temp:  97.7 F (36.5 C)  SpO2: (!) 88% 93%   Vitals:   03/04/23 2046 03/05/23 0403 03/05/23 0407 03/05/23 1314  BP: (!) 161/89 (!) 152/95 (!) 144/100 (!) 112/57  Pulse: (!) 119 (!) 115 100 92  Resp: 18 16 17    Temp: 97.6 F (36.4 C) 98.4 F (36.9 C)  97.7 F (36.5 C)  TempSrc: Oral Oral  Oral  SpO2: 95% 93% (!) 88% 93%  Weight:  (!) 140.9 kg    Height:        General: Pt is alert, awake, not in acute distress Cardiovascular: RRR, S1/S2 +, no rubs, no gallops Respiratory: CTA bilaterally, no wheezing, no rhonchi Abdominal: Soft, NT, ND, bowel sounds + Extremities: no edema, no cyanosis   The results of significant diagnostics from this hospitalization (including imaging, microbiology, ancillary and laboratory) are listed below for reference.     Microbiology: No results found for this or any previous visit (from the past 240 hour(s)).   Labs: BNP (last 3 results) Recent Labs    02/24/23 0548  BNP 135.6*   Basic Metabolic Panel: Recent Labs  Lab 02/27/23 0603 02/28/23 0441 02/28/23 0920 03/01/23 0610 03/01/23 0906 03/02/23 0328 03/04/23 0320 03/05/23 0454  NA 136 137 135  --  136 134* 136 137  K 3.6 3.6 3.3*  --  4.1 3.5 3.5 3.5  CL 102 103 102  --  102 102 105 104  CO2 26 26 25   --  23 20* 24 21*  GLUCOSE 111* 100* 126*  --  107* 125* 158* 123*  BUN 10 8 8   --  6* 8 9 6*  CREATININE 0.73 0.77 0.64  --  0.76 0.56 0.77 0.73  CALCIUM 8.6* 8.5* 8.6*  --  8.8* 8.6* 8.8* 8.9  MG 1.5* 1.7  --  1.7  --   --   --   --   PHOS  --   --  3.4  --   --   --   --   --     Liver Function Tests: Recent Labs  Lab 02/28/23 0920  ALBUMIN 2.9*   No results for input(s): "LIPASE", "AMYLASE" in the last 168 hours. No results for input(s): "AMMONIA" in the last 168 hours. CBC: Recent Labs  Lab 02/28/23 0441 03/01/23 0610 03/02/23 0328 03/04/23 0320 03/05/23 0454  WBC 12.0* 10.4 8.9 8.1 11.5*  NEUTROABS  --   --   --  4.8  --   HGB 13.6 14.3 14.0 13.5 13.8  HCT 43.3 44.8 44.5 42.7 44.2  MCV 87.5 88.4 89.2 89.3 90.4  PLT 260 273 250 210 214   Cardiac Enzymes: No results for input(s): "CKTOTAL", "CKMB", "CKMBINDEX", "TROPONINI" in the last 168 hours. BNP: Invalid input(s): "POCBNP" CBG: Recent Labs  Lab 03/04/23 1151 03/04/23 1645 03/04/23 2057 03/05/23 0811 03/05/23 1244  GLUCAP 142* 129* 131* 122* 140*   D-Dimer No results for input(s): "DDIMER" in the last 72 hours. Hgb A1c No results for input(s): "HGBA1C" in the last 72 hours. Lipid Profile No results for input(s): "CHOL", "HDL", "LDLCALC", "TRIG", "CHOLHDL", "LDLDIRECT" in the last 72 hours. Thyroid function studies No results for input(s): "TSH", "T4TOTAL", "T3FREE", "THYROIDAB" in the last 72 hours.  Invalid input(s): "FREET3" Anemia work up No results for input(s): "VITAMINB12", "FOLATE", "FERRITIN", "TIBC", "IRON", "RETICCTPCT" in the last 72 hours. Urinalysis    Component Value Date/Time   COLORURINE YELLOW 02/24/2023 0518  APPEARANCEUR CLEAR 02/24/2023 0518   APPEARANCEUR Clear 07/16/2018 1126   LABSPEC 1.030 02/24/2023 0518   PHURINE 5.0 02/24/2023 0518   GLUCOSEU >=500 (A) 02/24/2023 0518   HGBUR NEGATIVE 02/24/2023 0518   BILIRUBINUR NEGATIVE 02/24/2023 0518   BILIRUBINUR Negative 07/16/2018 1126   KETONESUR 5 (A) 02/24/2023 0518   PROTEINUR NEGATIVE 02/24/2023 0518   UROBILINOGEN 0.2 01/04/2009 1218   NITRITE NEGATIVE 02/24/2023 0518   LEUKOCYTESUR NEGATIVE 02/24/2023 0518   Sepsis Labs Recent Labs  Lab 03/01/23 0610 03/02/23 0328 03/04/23 0320  03/05/23 0454  WBC 10.4 8.9 8.1 11.5*   Microbiology No results found for this or any previous visit (from the past 240 hour(s)).   Time coordinating discharge: Over 30 minutes  SIGNED:   [name], DO [facility] 03/05/2023, 2:15 PM Pager   If 7PM-7AM, please contact night-coverage [email or web address]

## 2023-03-15 ENCOUNTER — Telehealth (HOSPITAL_COMMUNITY): Payer: Self-pay | Admitting: *Deleted

## 2023-03-15 NOTE — Telephone Encounter (Signed)
Patient called to cancel hospital follow up appointment stating she is recovering from back surgery. Offered to reschedule the appointment to give her a few weeks to recuperate. Pt states "I'm going to be frank with you, I'm not taking any of the medications that  were prescribed in the hospital and will not be making any follow up appointments." Attempted to re-educate patient on importance of medication compliance in setting of atrial fibrillation to reduce risk of stroke. Pt states she appreciates the information but she will not be taking the medication or following up. Appt canceled.

## 2023-03-19 ENCOUNTER — Ambulatory Visit (HOSPITAL_COMMUNITY): Payer: PPO | Admitting: Physician Assistant

## 2023-07-22 DIAGNOSIS — E118 Type 2 diabetes mellitus with unspecified complications: Secondary | ICD-10-CM | POA: Diagnosis not present

## 2023-10-02 ENCOUNTER — Encounter (HOSPITAL_COMMUNITY): Payer: Self-pay | Admitting: *Deleted

## 2023-10-02 ENCOUNTER — Other Ambulatory Visit: Payer: Self-pay

## 2023-10-02 ENCOUNTER — Emergency Department (HOSPITAL_COMMUNITY)

## 2023-10-02 ENCOUNTER — Inpatient Hospital Stay (HOSPITAL_COMMUNITY)
Admission: EM | Admit: 2023-10-02 | Discharge: 2023-10-17 | DRG: 291 | Disposition: A | Discharge status: Home Health | Attending: Internal Medicine | Admitting: Internal Medicine

## 2023-10-02 DIAGNOSIS — E876 Hypokalemia: Secondary | ICD-10-CM | POA: Diagnosis present

## 2023-10-02 DIAGNOSIS — E1169 Type 2 diabetes mellitus with other specified complication: Secondary | ICD-10-CM | POA: Diagnosis not present

## 2023-10-02 DIAGNOSIS — M810 Age-related osteoporosis without current pathological fracture: Secondary | ICD-10-CM | POA: Diagnosis present

## 2023-10-02 DIAGNOSIS — S32010A Wedge compression fracture of first lumbar vertebra, initial encounter for closed fracture: Secondary | ICD-10-CM | POA: Diagnosis not present

## 2023-10-02 DIAGNOSIS — Z7901 Long term (current) use of anticoagulants: Secondary | ICD-10-CM

## 2023-10-02 DIAGNOSIS — Z90710 Acquired absence of both cervix and uterus: Secondary | ICD-10-CM

## 2023-10-02 DIAGNOSIS — I4819 Other persistent atrial fibrillation: Secondary | ICD-10-CM

## 2023-10-02 DIAGNOSIS — M5126 Other intervertebral disc displacement, lumbar region: Secondary | ICD-10-CM | POA: Diagnosis not present

## 2023-10-02 DIAGNOSIS — R600 Localized edema: Secondary | ICD-10-CM | POA: Diagnosis not present

## 2023-10-02 DIAGNOSIS — E119 Type 2 diabetes mellitus without complications: Secondary | ICD-10-CM

## 2023-10-02 DIAGNOSIS — E66813 Obesity, class 3: Secondary | ICD-10-CM | POA: Diagnosis present

## 2023-10-02 DIAGNOSIS — M7918 Myalgia, other site: Secondary | ICD-10-CM | POA: Diagnosis not present

## 2023-10-02 DIAGNOSIS — R0602 Shortness of breath: Principal | ICD-10-CM

## 2023-10-02 DIAGNOSIS — E782 Mixed hyperlipidemia: Secondary | ICD-10-CM | POA: Diagnosis not present

## 2023-10-02 DIAGNOSIS — K5900 Constipation, unspecified: Secondary | ICD-10-CM | POA: Diagnosis present

## 2023-10-02 DIAGNOSIS — E785 Hyperlipidemia, unspecified: Secondary | ICD-10-CM | POA: Diagnosis present

## 2023-10-02 DIAGNOSIS — S22000S Wedge compression fracture of unspecified thoracic vertebra, sequela: Secondary | ICD-10-CM | POA: Diagnosis not present

## 2023-10-02 DIAGNOSIS — M4856XA Collapsed vertebra, not elsewhere classified, lumbar region, initial encounter for fracture: Secondary | ICD-10-CM | POA: Diagnosis not present

## 2023-10-02 DIAGNOSIS — I110 Hypertensive heart disease with heart failure: Principal | ICD-10-CM | POA: Diagnosis present

## 2023-10-02 DIAGNOSIS — Z7984 Long term (current) use of oral hypoglycemic drugs: Secondary | ICD-10-CM

## 2023-10-02 DIAGNOSIS — I4891 Unspecified atrial fibrillation: Secondary | ICD-10-CM | POA: Diagnosis not present

## 2023-10-02 DIAGNOSIS — M40209 Unspecified kyphosis, site unspecified: Secondary | ICD-10-CM | POA: Diagnosis not present

## 2023-10-02 DIAGNOSIS — I5031 Acute diastolic (congestive) heart failure: Secondary | ICD-10-CM | POA: Diagnosis not present

## 2023-10-02 DIAGNOSIS — Z888 Allergy status to other drugs, medicaments and biological substances status: Secondary | ICD-10-CM

## 2023-10-02 DIAGNOSIS — I482 Chronic atrial fibrillation, unspecified: Secondary | ICD-10-CM | POA: Diagnosis not present

## 2023-10-02 DIAGNOSIS — Z6841 Body Mass Index (BMI) 40.0 and over, adult: Secondary | ICD-10-CM

## 2023-10-02 DIAGNOSIS — M48061 Spinal stenosis, lumbar region without neurogenic claudication: Secondary | ICD-10-CM | POA: Diagnosis not present

## 2023-10-02 DIAGNOSIS — M545 Low back pain, unspecified: Secondary | ICD-10-CM | POA: Diagnosis present

## 2023-10-02 DIAGNOSIS — I5033 Acute on chronic diastolic (congestive) heart failure: Secondary | ICD-10-CM | POA: Diagnosis not present

## 2023-10-02 DIAGNOSIS — Z79899 Other long term (current) drug therapy: Secondary | ICD-10-CM

## 2023-10-02 DIAGNOSIS — Z91148 Patient's other noncompliance with medication regimen for other reason: Secondary | ICD-10-CM

## 2023-10-02 DIAGNOSIS — M5440 Lumbago with sciatica, unspecified side: Secondary | ICD-10-CM | POA: Diagnosis not present

## 2023-10-02 DIAGNOSIS — R0989 Other specified symptoms and signs involving the circulatory and respiratory systems: Secondary | ICD-10-CM | POA: Diagnosis not present

## 2023-10-02 DIAGNOSIS — I3139 Other pericardial effusion (noninflammatory): Secondary | ICD-10-CM | POA: Diagnosis present

## 2023-10-02 DIAGNOSIS — S32001A Stable burst fracture of unspecified lumbar vertebra, initial encounter for closed fracture: Secondary | ICD-10-CM | POA: Insufficient documentation

## 2023-10-02 DIAGNOSIS — I517 Cardiomegaly: Secondary | ICD-10-CM | POA: Diagnosis not present

## 2023-10-02 DIAGNOSIS — G8929 Other chronic pain: Secondary | ICD-10-CM | POA: Diagnosis present

## 2023-10-02 DIAGNOSIS — I10 Essential (primary) hypertension: Secondary | ICD-10-CM | POA: Diagnosis not present

## 2023-10-02 DIAGNOSIS — I7121 Aneurysm of the ascending aorta, without rupture: Secondary | ICD-10-CM | POA: Diagnosis not present

## 2023-10-02 DIAGNOSIS — Z885 Allergy status to narcotic agent status: Secondary | ICD-10-CM

## 2023-10-02 DIAGNOSIS — Z86718 Personal history of other venous thrombosis and embolism: Secondary | ICD-10-CM

## 2023-10-02 DIAGNOSIS — M7989 Other specified soft tissue disorders: Secondary | ICD-10-CM | POA: Diagnosis not present

## 2023-10-02 DIAGNOSIS — E871 Hypo-osmolality and hyponatremia: Secondary | ICD-10-CM | POA: Diagnosis present

## 2023-10-02 DIAGNOSIS — Z87891 Personal history of nicotine dependence: Secondary | ICD-10-CM

## 2023-10-02 DIAGNOSIS — E118 Type 2 diabetes mellitus with unspecified complications: Secondary | ICD-10-CM | POA: Diagnosis not present

## 2023-10-02 DIAGNOSIS — I4821 Permanent atrial fibrillation: Secondary | ICD-10-CM | POA: Diagnosis not present

## 2023-10-02 DIAGNOSIS — E1165 Type 2 diabetes mellitus with hyperglycemia: Secondary | ICD-10-CM | POA: Diagnosis present

## 2023-10-02 DIAGNOSIS — Z8249 Family history of ischemic heart disease and other diseases of the circulatory system: Secondary | ICD-10-CM

## 2023-10-02 DIAGNOSIS — R918 Other nonspecific abnormal finding of lung field: Secondary | ICD-10-CM | POA: Diagnosis not present

## 2023-10-02 LAB — CBC WITH DIFFERENTIAL/PLATELET
Abs Immature Granulocytes: 0.03 10*3/uL (ref 0.00–0.07)
Basophils Absolute: 0.1 10*3/uL (ref 0.0–0.1)
Basophils Relative: 1 %
Eosinophils Absolute: 0.2 10*3/uL (ref 0.0–0.5)
Eosinophils Relative: 2 %
HCT: 48.4 % — ABNORMAL HIGH (ref 36.0–46.0)
Hemoglobin: 14.9 g/dL (ref 12.0–15.0)
Immature Granulocytes: 0 %
Lymphocytes Relative: 18 %
Lymphs Abs: 1.8 10*3/uL (ref 0.7–4.0)
MCH: 27.3 pg (ref 26.0–34.0)
MCHC: 30.8 g/dL (ref 30.0–36.0)
MCV: 88.8 fL (ref 80.0–100.0)
Monocytes Absolute: 0.5 10*3/uL (ref 0.1–1.0)
Monocytes Relative: 5 %
Neutro Abs: 7.7 10*3/uL (ref 1.7–7.7)
Neutrophils Relative %: 74 %
Platelets: 289 10*3/uL (ref 150–400)
RBC: 5.45 MIL/uL — ABNORMAL HIGH (ref 3.87–5.11)
RDW: 14.2 % (ref 11.5–15.5)
WBC: 10.2 10*3/uL (ref 4.0–10.5)
nRBC: 0 % (ref 0.0–0.2)

## 2023-10-02 LAB — COMPREHENSIVE METABOLIC PANEL WITH GFR
ALT: 14 U/L (ref 0–44)
AST: 19 U/L (ref 15–41)
Albumin: 3.6 g/dL (ref 3.5–5.0)
Alkaline Phosphatase: 60 U/L (ref 38–126)
Anion gap: 8 (ref 5–15)
BUN: 11 mg/dL (ref 8–23)
CO2: 25 mmol/L (ref 22–32)
Calcium: 9 mg/dL (ref 8.9–10.3)
Chloride: 107 mmol/L (ref 98–111)
Creatinine, Ser: 0.7 mg/dL (ref 0.44–1.00)
GFR, Estimated: >60 mL/min (ref >60)
Glucose, Bld: 143 mg/dL — ABNORMAL HIGH (ref 70–99)
Potassium: 3.4 mmol/L — ABNORMAL LOW (ref 3.5–5.1)
Sodium: 140 mmol/L (ref 135–145)
Total Bilirubin: 0.9 mg/dL (ref 0.0–1.2)
Total Protein: 8.6 g/dL — ABNORMAL HIGH (ref 6.5–8.1)

## 2023-10-02 LAB — BRAIN NATRIURETIC PEPTIDE: B Natriuretic Peptide: 189.8 pg/mL — ABNORMAL HIGH (ref 0.0–100.0)

## 2023-10-02 MED ORDER — METHOCARBAMOL 500 MG PO TABS
750.0000 mg | ORAL_TABLET | Freq: Once | ORAL | Status: AC
  Administered 2023-10-02: 750 mg via ORAL
  Filled 2023-10-02: qty 2

## 2023-10-02 NOTE — ED Notes (Signed)
 Pt. Has 1+ bilateral leg edema

## 2023-10-02 NOTE — ED Provider Triage Note (Signed)
 Emergency Medicine Provider Triage Evaluation Note  Tracie [name] , a 63 y.o. female  was evaluated in triage.  Pt complains of bilateral leg swelling for 7 months, reports worsening over the last couple weeks.  Recently had back surgery?  Does not know what she had done.  Continues to have worsening pain.  Denies any fever, changes in gait.  Reports she cannot lay in bed due to the pain into her back.  Review of Systems  Positive: Leg swelling, back pain Negative: Fever,  Physical Exam  BP (!) 144/90   Pulse (!) 118   Temp 98.3 F (36.8 C)   Resp 18   Ht 5' 10 (1.778 m)   Wt (!) 140.9 kg   SpO2 95%   BMI 44.57 kg/m  Gen:   Awake, no distress   Resp:  Normal effort  MSK:   Moves extremities without difficulty  Other:  2+ pitting edema, ttp along the midline of the lumbar spine  Medical Decision Making  Medically screening exam initiated at 5:10 PM.  Appropriate orders placed.  [name] was informed that the remainder of the evaluation will be completed by another provider, this initial triage assessment does not replace that evaluation, and the importance of remaining in the ED until their evaluation is complete.     Merwyn Hodapp, PA-C 10/02/23 1714

## 2023-10-02 NOTE — ED Triage Notes (Signed)
 The pt has had swelling in both her legs  for 7 months that was she  when she had back surgery she reports that she has not been able to sleep in her bed  she has been sitting up

## 2023-10-03 ENCOUNTER — Encounter (HOSPITAL_COMMUNITY): Payer: Self-pay | Admitting: Family Medicine

## 2023-10-03 ENCOUNTER — Observation Stay (HOSPITAL_COMMUNITY)

## 2023-10-03 DIAGNOSIS — I5031 Acute diastolic (congestive) heart failure: Secondary | ICD-10-CM | POA: Diagnosis not present

## 2023-10-03 DIAGNOSIS — I110 Hypertensive heart disease with heart failure: Secondary | ICD-10-CM | POA: Diagnosis not present

## 2023-10-03 DIAGNOSIS — E119 Type 2 diabetes mellitus without complications: Secondary | ICD-10-CM | POA: Diagnosis not present

## 2023-10-03 DIAGNOSIS — I482 Chronic atrial fibrillation, unspecified: Secondary | ICD-10-CM | POA: Diagnosis not present

## 2023-10-03 DIAGNOSIS — R0989 Other specified symptoms and signs involving the circulatory and respiratory systems: Secondary | ICD-10-CM | POA: Diagnosis not present

## 2023-10-03 DIAGNOSIS — M40209 Unspecified kyphosis, site unspecified: Secondary | ICD-10-CM | POA: Diagnosis not present

## 2023-10-03 DIAGNOSIS — I4819 Other persistent atrial fibrillation: Secondary | ICD-10-CM | POA: Insufficient documentation

## 2023-10-03 DIAGNOSIS — N281 Cyst of kidney, acquired: Secondary | ICD-10-CM | POA: Diagnosis not present

## 2023-10-03 DIAGNOSIS — I4821 Permanent atrial fibrillation: Secondary | ICD-10-CM | POA: Diagnosis not present

## 2023-10-03 DIAGNOSIS — Z888 Allergy status to other drugs, medicaments and biological substances status: Secondary | ICD-10-CM | POA: Diagnosis not present

## 2023-10-03 DIAGNOSIS — K5900 Constipation, unspecified: Secondary | ICD-10-CM | POA: Diagnosis not present

## 2023-10-03 DIAGNOSIS — E118 Type 2 diabetes mellitus with unspecified complications: Secondary | ICD-10-CM | POA: Diagnosis not present

## 2023-10-03 DIAGNOSIS — S32009A Unspecified fracture of unspecified lumbar vertebra, initial encounter for closed fracture: Secondary | ICD-10-CM | POA: Diagnosis not present

## 2023-10-03 DIAGNOSIS — M7989 Other specified soft tissue disorders: Secondary | ICD-10-CM

## 2023-10-03 DIAGNOSIS — Z7984 Long term (current) use of oral hypoglycemic drugs: Secondary | ICD-10-CM | POA: Diagnosis not present

## 2023-10-03 DIAGNOSIS — D1809 Hemangioma of other sites: Secondary | ICD-10-CM | POA: Diagnosis not present

## 2023-10-03 DIAGNOSIS — Z885 Allergy status to narcotic agent status: Secondary | ICD-10-CM | POA: Diagnosis not present

## 2023-10-03 DIAGNOSIS — E871 Hypo-osmolality and hyponatremia: Secondary | ICD-10-CM | POA: Diagnosis not present

## 2023-10-03 DIAGNOSIS — M5440 Lumbago with sciatica, unspecified side: Secondary | ICD-10-CM | POA: Diagnosis not present

## 2023-10-03 DIAGNOSIS — I7121 Aneurysm of the ascending aorta, without rupture: Secondary | ICD-10-CM | POA: Diagnosis not present

## 2023-10-03 DIAGNOSIS — I5033 Acute on chronic diastolic (congestive) heart failure: Secondary | ICD-10-CM | POA: Diagnosis not present

## 2023-10-03 DIAGNOSIS — R600 Localized edema: Secondary | ICD-10-CM | POA: Diagnosis not present

## 2023-10-03 DIAGNOSIS — E1165 Type 2 diabetes mellitus with hyperglycemia: Secondary | ICD-10-CM | POA: Diagnosis not present

## 2023-10-03 DIAGNOSIS — E785 Hyperlipidemia, unspecified: Secondary | ICD-10-CM | POA: Diagnosis not present

## 2023-10-03 DIAGNOSIS — M810 Age-related osteoporosis without current pathological fracture: Secondary | ICD-10-CM | POA: Diagnosis not present

## 2023-10-03 DIAGNOSIS — R0602 Shortness of breath: Secondary | ICD-10-CM | POA: Diagnosis not present

## 2023-10-03 DIAGNOSIS — Z8249 Family history of ischemic heart disease and other diseases of the circulatory system: Secondary | ICD-10-CM | POA: Diagnosis not present

## 2023-10-03 DIAGNOSIS — I4891 Unspecified atrial fibrillation: Secondary | ICD-10-CM | POA: Diagnosis not present

## 2023-10-03 DIAGNOSIS — Z87891 Personal history of nicotine dependence: Secondary | ICD-10-CM | POA: Diagnosis not present

## 2023-10-03 DIAGNOSIS — M4856XA Collapsed vertebra, not elsewhere classified, lumbar region, initial encounter for fracture: Secondary | ICD-10-CM | POA: Diagnosis not present

## 2023-10-03 DIAGNOSIS — I3139 Other pericardial effusion (noninflammatory): Secondary | ICD-10-CM | POA: Diagnosis not present

## 2023-10-03 DIAGNOSIS — E876 Hypokalemia: Secondary | ICD-10-CM | POA: Diagnosis not present

## 2023-10-03 DIAGNOSIS — M48061 Spinal stenosis, lumbar region without neurogenic claudication: Secondary | ICD-10-CM | POA: Diagnosis not present

## 2023-10-03 DIAGNOSIS — E1169 Type 2 diabetes mellitus with other specified complication: Secondary | ICD-10-CM | POA: Diagnosis not present

## 2023-10-03 DIAGNOSIS — G8929 Other chronic pain: Secondary | ICD-10-CM | POA: Diagnosis not present

## 2023-10-03 DIAGNOSIS — S32001A Stable burst fracture of unspecified lumbar vertebra, initial encounter for closed fracture: Secondary | ICD-10-CM | POA: Insufficient documentation

## 2023-10-03 DIAGNOSIS — S32029A Unspecified fracture of second lumbar vertebra, initial encounter for closed fracture: Secondary | ICD-10-CM | POA: Diagnosis not present

## 2023-10-03 DIAGNOSIS — M545 Low back pain, unspecified: Secondary | ICD-10-CM

## 2023-10-03 DIAGNOSIS — M8008XA Age-related osteoporosis with current pathological fracture, vertebra(e), initial encounter for fracture: Secondary | ICD-10-CM | POA: Diagnosis not present

## 2023-10-03 DIAGNOSIS — Z79899 Other long term (current) drug therapy: Secondary | ICD-10-CM | POA: Diagnosis not present

## 2023-10-03 DIAGNOSIS — Z86718 Personal history of other venous thrombosis and embolism: Secondary | ICD-10-CM | POA: Diagnosis not present

## 2023-10-03 DIAGNOSIS — I517 Cardiomegaly: Secondary | ICD-10-CM | POA: Diagnosis not present

## 2023-10-03 DIAGNOSIS — I10 Essential (primary) hypertension: Secondary | ICD-10-CM | POA: Diagnosis not present

## 2023-10-03 DIAGNOSIS — M47814 Spondylosis without myelopathy or radiculopathy, thoracic region: Secondary | ICD-10-CM | POA: Diagnosis not present

## 2023-10-03 DIAGNOSIS — Z7901 Long term (current) use of anticoagulants: Secondary | ICD-10-CM | POA: Diagnosis not present

## 2023-10-03 DIAGNOSIS — Z6841 Body Mass Index (BMI) 40.0 and over, adult: Secondary | ICD-10-CM | POA: Diagnosis not present

## 2023-10-03 DIAGNOSIS — I700 Atherosclerosis of aorta: Secondary | ICD-10-CM | POA: Diagnosis not present

## 2023-10-03 DIAGNOSIS — S32019A Unspecified fracture of first lumbar vertebra, initial encounter for closed fracture: Secondary | ICD-10-CM | POA: Diagnosis not present

## 2023-10-03 DIAGNOSIS — E66813 Obesity, class 3: Secondary | ICD-10-CM | POA: Diagnosis not present

## 2023-10-03 LAB — CBC
HCT: 46.9 % — ABNORMAL HIGH (ref 36.0–46.0)
Hemoglobin: 14.2 g/dL (ref 12.0–15.0)
MCH: 27 pg (ref 26.0–34.0)
MCHC: 30.3 g/dL (ref 30.0–36.0)
MCV: 89.3 fL (ref 80.0–100.0)
Platelets: 254 10*3/uL (ref 150–400)
RBC: 5.25 MIL/uL — ABNORMAL HIGH (ref 3.87–5.11)
RDW: 14.1 % (ref 11.5–15.5)
WBC: 11.5 10*3/uL — ABNORMAL HIGH (ref 4.0–10.5)
nRBC: 0 % (ref 0.0–0.2)

## 2023-10-03 LAB — BASIC METABOLIC PANEL WITH GFR
Anion gap: 11 (ref 5–15)
BUN: 10 mg/dL (ref 8–23)
CO2: 21 mmol/L — ABNORMAL LOW (ref 22–32)
Calcium: 8.6 mg/dL — ABNORMAL LOW (ref 8.9–10.3)
Chloride: 107 mmol/L (ref 98–111)
Creatinine, Ser: 0.59 mg/dL (ref 0.44–1.00)
GFR, Estimated: >60 mL/min (ref >60)
Glucose, Bld: 160 mg/dL — ABNORMAL HIGH (ref 70–99)
Potassium: 2.9 mmol/L — ABNORMAL LOW (ref 3.5–5.1)
Sodium: 139 mmol/L (ref 135–145)

## 2023-10-03 LAB — HEMOGLOBIN A1C
Hgb A1c MFr Bld: 6.2 % — ABNORMAL HIGH (ref 4.8–5.6)
Mean Plasma Glucose: 131.24 mg/dL

## 2023-10-03 LAB — GLUCOSE, CAPILLARY
Glucose-Capillary: 156 mg/dL — ABNORMAL HIGH (ref 70–99)
Glucose-Capillary: 143 mg/dL — ABNORMAL HIGH (ref 70–99)

## 2023-10-03 LAB — MAGNESIUM: Magnesium: 1.5 mg/dL — ABNORMAL LOW (ref 1.7–2.4)

## 2023-10-03 LAB — CBG MONITORING, ED
Glucose-Capillary: 157 mg/dL — ABNORMAL HIGH (ref 70–99)
Glucose-Capillary: 167 mg/dL — ABNORMAL HIGH (ref 70–99)

## 2023-10-03 MED ORDER — POTASSIUM CHLORIDE 10 MEQ/100ML IV SOLN
10.0000 meq | INTRAVENOUS | Status: AC
  Administered 2023-10-03 (×3): 10 meq via INTRAVENOUS
  Filled 2023-10-03 (×2): qty 100

## 2023-10-03 MED ORDER — SODIUM CHLORIDE 0.9% FLUSH
3.0000 mL | Freq: Two times a day (BID) | INTRAVENOUS | Status: DC
  Administered 2023-10-03 – 2023-10-17 (×28): 3 mL via INTRAVENOUS

## 2023-10-03 MED ORDER — WARFARIN - PHARMACIST DOSING INPATIENT: Freq: Every day | Status: DC

## 2023-10-03 MED ORDER — ACETAMINOPHEN 650 MG RE SUPP: 650.0000 mg | Freq: Four times a day (QID) | RECTAL | Status: DC | PRN

## 2023-10-03 MED ORDER — ENOXAPARIN SODIUM 150 MG/ML IJ SOSY
1.0000 mg/kg | PREFILLED_SYRINGE | Freq: Two times a day (BID) | INTRAMUSCULAR | Status: DC
  Administered 2023-10-03: 150 mg via SUBCUTANEOUS
  Filled 2023-10-03 (×2): qty 1

## 2023-10-03 MED ORDER — METOPROLOL SUCCINATE ER 25 MG PO TB24: 100.0000 mg | ORAL_TABLET | Freq: Two times a day (BID) | ORAL | Status: DC

## 2023-10-03 MED ORDER — ACETAMINOPHEN 325 MG PO TABS
650.0000 mg | ORAL_TABLET | Freq: Four times a day (QID) | ORAL | Status: DC | PRN
  Administered 2023-10-10 – 2023-10-12 (×2): 650 mg via ORAL
  Filled 2023-10-03 (×2): qty 2

## 2023-10-03 MED ORDER — POTASSIUM CHLORIDE CRYS ER 20 MEQ PO TBCR: 20.0000 meq | EXTENDED_RELEASE_TABLET | Freq: Two times a day (BID) | ORAL | Status: DC

## 2023-10-03 MED ORDER — HYDROMORPHONE HCL 1 MG/ML IJ SOLN
0.5000 mg | INTRAMUSCULAR | Status: DC | PRN
  Administered 2023-10-03 – 2023-10-05 (×7): 1 mg via INTRAVENOUS
  Filled 2023-10-03 (×8): qty 1

## 2023-10-03 MED ORDER — ONDANSETRON HCL 4 MG PO TABS: 4.0000 mg | ORAL_TABLET | Freq: Four times a day (QID) | ORAL | Status: DC | PRN

## 2023-10-03 MED ORDER — INSULIN ASPART 100 UNIT/ML IJ SOLN
0.0000 [IU] | Freq: Three times a day (TID) | INTRAMUSCULAR | Status: DC
  Administered 2023-10-03: 1 [IU] via SUBCUTANEOUS
  Administered 2023-10-03: 2 [IU] via SUBCUTANEOUS
  Administered 2023-10-04 – 2023-10-06 (×7): 1 [IU] via SUBCUTANEOUS
  Administered 2023-10-07: 2 [IU] via SUBCUTANEOUS
  Administered 2023-10-07 (×2): 1 [IU] via SUBCUTANEOUS

## 2023-10-03 MED ORDER — METOPROLOL SUCCINATE ER 50 MG PO TB24
50.0000 mg | ORAL_TABLET | Freq: Two times a day (BID) | ORAL | Status: DC
  Administered 2023-10-03 – 2023-10-04 (×2): 50 mg via ORAL
  Filled 2023-10-03 (×2): qty 1

## 2023-10-03 MED ORDER — OXYCODONE HCL 5 MG PO TABS
5.0000 mg | ORAL_TABLET | ORAL | Status: DC | PRN
  Administered 2023-10-03 – 2023-10-04 (×2): 5 mg via ORAL
  Filled 2023-10-03 (×2): qty 1

## 2023-10-03 MED ORDER — APIXABAN 5 MG PO TABS
5.0000 mg | ORAL_TABLET | Freq: Two times a day (BID) | ORAL | Status: DC
  Administered 2023-10-03: 5 mg via ORAL
  Filled 2023-10-03: qty 1

## 2023-10-03 MED ORDER — WARFARIN SODIUM 7.5 MG PO TABS
7.5000 mg | ORAL_TABLET | Freq: Once | ORAL | Status: AC
  Administered 2023-10-03: 7.5 mg via ORAL
  Filled 2023-10-03: qty 1

## 2023-10-03 MED ORDER — LOSARTAN POTASSIUM 50 MG PO TABS: 100.0000 mg | ORAL_TABLET | Freq: Every day | ORAL | Status: DC

## 2023-10-03 MED ORDER — KETOROLAC TROMETHAMINE 30 MG/ML IJ SOLN
30.0000 mg | Freq: Once | INTRAMUSCULAR | Status: AC
  Administered 2023-10-03: 30 mg via INTRAVENOUS
  Filled 2023-10-03: qty 1

## 2023-10-03 MED ORDER — HYDROMORPHONE HCL 1 MG/ML IJ SOLN
1.0000 mg | Freq: Once | INTRAMUSCULAR | Status: AC
  Administered 2023-10-03: 1 mg via INTRAVENOUS
  Filled 2023-10-03: qty 1

## 2023-10-03 MED ORDER — DILTIAZEM HCL ER COATED BEADS 180 MG PO CP24: 360.0000 mg | ORAL_CAPSULE | Freq: Every day | ORAL | Status: DC

## 2023-10-03 MED ORDER — ONDANSETRON HCL 4 MG/2ML IJ SOLN
4.0000 mg | Freq: Four times a day (QID) | INTRAMUSCULAR | Status: DC | PRN
Start: 2023-10-03 — End: 2023-10-17
  Administered 2023-10-03: 4 mg via INTRAVENOUS
  Filled 2023-10-03: qty 2

## 2023-10-03 MED ORDER — KETOROLAC TROMETHAMINE 15 MG/ML IJ SOLN
15.0000 mg | Freq: Four times a day (QID) | INTRAMUSCULAR | Status: DC | PRN
  Administered 2023-10-03 – 2023-10-04 (×4): 15 mg via INTRAVENOUS
  Filled 2023-10-03 (×5): qty 1

## 2023-10-03 MED ORDER — HYDROMORPHONE HCL 1 MG/ML IJ SOLN: 0.5000 mg | Freq: Once | INTRAMUSCULAR | Status: DC

## 2023-10-03 MED ORDER — MORPHINE SULFATE (PF) 4 MG/ML IV SOLN
4.0000 mg | Freq: Once | INTRAVENOUS | Status: AC
  Administered 2023-10-03: 4 mg via INTRAVENOUS
  Filled 2023-10-03: qty 1

## 2023-10-03 MED ORDER — FUROSEMIDE 10 MG/ML IJ SOLN
40.0000 mg | Freq: Two times a day (BID) | INTRAMUSCULAR | Status: DC
  Administered 2023-10-03 – 2023-10-05 (×5): 40 mg via INTRAVENOUS
  Filled 2023-10-03 (×5): qty 4

## 2023-10-03 MED ORDER — POLYETHYLENE GLYCOL 3350 17 G PO PACK
17.0000 g | PACK | Freq: Every day | ORAL | Status: DC | PRN
Start: 2023-10-03 — End: 2023-10-13
  Administered 2023-10-05: 17 g via ORAL
  Filled 2023-10-03: qty 1

## 2023-10-03 MED ORDER — METOPROLOL SUCCINATE ER 50 MG PO TB24
50.0000 mg | ORAL_TABLET | Freq: Every day | ORAL | Status: DC
  Administered 2023-10-03: 50 mg via ORAL
  Filled 2023-10-03: qty 2

## 2023-10-03 MED ORDER — POTASSIUM CHLORIDE CRYS ER 20 MEQ PO TBCR
40.0000 meq | EXTENDED_RELEASE_TABLET | Freq: Two times a day (BID) | ORAL | Status: DC
  Administered 2023-10-03 – 2023-10-05 (×5): 40 meq via ORAL
  Filled 2023-10-03 (×6): qty 2

## 2023-10-03 MED ORDER — METHOCARBAMOL 500 MG PO TABS
750.0000 mg | ORAL_TABLET | Freq: Four times a day (QID) | ORAL | Status: DC | PRN
  Administered 2023-10-03 – 2023-10-04 (×2): 750 mg via ORAL
  Filled 2023-10-03 (×3): qty 2

## 2023-10-03 MED ORDER — INSULIN ASPART 100 UNIT/ML IJ SOLN
0.0000 [IU] | Freq: Every day | INTRAMUSCULAR | Status: DC
  Administered 2023-10-05 – 2023-10-06 (×2): 2 [IU] via SUBCUTANEOUS

## 2023-10-03 MED ORDER — HYDROMORPHONE HCL 1 MG/ML IJ SOLN
0.5000 mg | Freq: Once | INTRAMUSCULAR | Status: AC
  Administered 2023-10-03: 0.5 mg via INTRAVENOUS
  Filled 2023-10-03: qty 1

## 2023-10-03 NOTE — Progress Notes (Signed)
 PHARMACY - ANTICOAGULATION CONSULT NOTE  Pharmacy Consult for Warfarin Indication: atrial fibrillation  Allergies  Allergen Reactions   Codeine Other (See Comments)    Unknown reaction   Crestor [Rosuvastatin] Other (See Comments)    Myalgias  Weakness    Patient Measurements: Height: 5' 10.5 (179.1 cm) Weight: (!) 150.5 kg (331 lb 12.7 oz) IBW/kg (Calculated) : 69.65 HEPARIN  DW (KG): 106.1  Vital Signs: Temp: 98.2 F (36.8 C) (06/26 1713) Temp Source: Oral (06/26 1713) BP: 157/86 (06/26 1713) Pulse Rate: 111 (06/26 1713)  Labs: Recent Labs    10/02/23 1711 10/03/23 0523  HGB 14.9 14.2  HCT 48.4* 46.9*  PLT 289 254  CREATININE 0.70 0.59    Estimated Creatinine Clearance: 114.4 mL/min (by C-G formula based on SCr of 0.59 mg/dL).   Medical History: Past Medical History:  Diagnosis Date   Atrial fibrillation (HCC)    Celiac artery dissection (HCC)    Diabetes mellitus (HCC)    History of DVT (deep vein thrombosis) 2010   Hyperlipidemia    Hypertension    Morbid obesity (HCC)     Medications:  Scheduled:   furosemide  40 mg Intravenous BID   insulin  aspart  0-5 Units Subcutaneous QHS   insulin  aspart  0-6 Units Subcutaneous TID WC   [START ON 10/04/2023] losartan   100 mg Oral Daily   metoprolol  succinate  50 mg Oral BID   potassium chloride   40 mEq Oral BID   sodium chloride  flush  3 mL Intravenous Q12H   Infusions:  PRN: acetaminophen  **OR** acetaminophen , HYDROmorphone  (DILAUDID ) injection, ketorolac , methocarbamol, ondansetron  **OR** ondansetron  (ZOFRAN ) IV, oxyCODONE , polyethylene glycol  Assessment: 63 yo female with afib prescribed Eliquis  but not taking due to concern over bleeding and cost issues. Pharmacy consulted to dose warfarin. She did receive a dose of Eliquis  5mg  this morning while inpatient.  Goal of Therapy:  INR 2-3 Monitor platelets by anticoagulation protocol: Yes   Plan:  Warfarin 7.5mg  PO x 1 tonight Daily PT/INR Lovenox   1mg /kg SQ q12h until INR >/= 2.0 per Cards  Rocky Slade, PharmD, BCPS 10/03/2023,5:50 PM

## 2023-10-03 NOTE — ED Notes (Signed)
 Pt still unable to lay flat during MRI after additional pain medication. MRI on hold at this time advised PA [facility].

## 2023-10-03 NOTE — ED Notes (Signed)
 Helped pt changed into a gown, put grip socks on and assisted on the bed pan

## 2023-10-03 NOTE — ED Notes (Signed)
 Patient transported to MRI

## 2023-10-03 NOTE — Progress Notes (Addendum)
 Cardiology Consultation   Patient ID: Tracie [name] MRN: [id number]; DOB: Aug 21, 1960  Admit date: 10/02/2023 Date of Consult: 10/03/2023  PCP:  Tracie Millman, MD    HeartCare Providers Cardiologist:  Tracie Fell, MD        Patient Profile: Tracie [name] is a 63 y.o. female with a hx of atrial fibrillation on Eliquis , hypertension, type 2 diabetes, history of celiac artery dissection in 2010, and chronic low back pain who is being seen 10/03/2023 for the evaluation of CHF/AF at the request of Tracie Riser MD.  History of Present Illness: Tracie [name] is a 63 year old female with past medical history listed above who was last seen by heart care November 2024.   In 2010 patient had a celiac artery dissection/thrombus in the setting of trauma by a shopping cart injury. There is mention of a DVT in her past in 2010 though no chart records, patient has previously denied history of DVT.   She was first diagnosed with atrial fibrillation RVR during a primary care visit in November 2021.  Her PCP placed her on metoprolol  and Eliquis  with referral to cardiology. She subsequently saw Tracie [name] outpatient that same month.  At that time the patient deferred further investigation and management for her atrial fibrillation.  It appears at that time patient was distressed about the abrupt medication changes, and she ultimately switched primary care and started following with [facility].  For period of time patient declined anticoagulation.   In November 2024 patient presented to the ED for back pain and was found to be in A-fib RVR (HR 120).  Heart care was consulted. Echocardiogram showed EF 60 to 65% with no regional wall motion abnormalities mild LVH.  RV systolic function was reduced. Mildly dilated LA and moderately dilated RA.  Mild MR.   At this time she was also reporting lower extremity edema and shortness of breath on extertion, etiology was unclear suspected to be  multifactorial.  Patient was discharged on Eliquis , diltiazem  360mg  daily and metoprolol  succinate 100mg  twice daily.  She was to follow-up with heart care outpatient, she was lost to follow-up.  Presented to the ED on 10/02/2023 for low back pain, shortness of breath on exertion, orthopnea and peripheral edema.  K3.4 -> 2.9   creatinine 0.7    BNP 189 CXR with cardiomegaly with vascular congestion Bilateral Venous ultrasound lower extremity showed no evidence of DVT bilaterally ECG atrial fibrillation VR 117 She has received no IV diuresis.  She has received 1 dose of eliquis ,  metoprolol  succinate 50 mg and IV K repletion  Patient shares she presented to the ED due to her back pain.  In regards to her AF she does not notice when she is in A-fib.  She denies lightheadedness, dizziness, palpitations, and chest pain.  She also shared that she does not take the metoprolol  succinate 100 mg twice daily. She reports the metoprolol  succinate 100 mg tablet caused bilateral arm pain, therefore she started taking metoprolol  succinate 50 mg daily instead.  She stopped taking the Cardizem  due to it making her feel bad.  She has not been taking Eliquis  for 2 reasons: 1) reason being she read the side effect pamphlet and was worried about having a bleed 2) she shared that she cannot afford the Eliquis .  She shared that she would be willing to have ongoing conversations about anticoagulation, though she is still worried about possible side effects.   She reports dyspnea on exertion though this  has been ongoing for years, and it has not changed since he last admission. She denies orthopnea, though she does not lay flat due to her ongoing back issues.  No PND.  She has noticed worsening peripheral edema and abdominal distention in the last several months.  She has not seen a cardiologist since being discharged in November 2024  She has a 20-pack-year history.  She lives at home with her husband.  Past Medical  History:  Diagnosis Date   Atrial fibrillation (HCC)    Celiac artery dissection (HCC)    Diabetes mellitus (HCC)    History of DVT (deep vein thrombosis) 2010   Hyperlipidemia    Hypertension    Morbid obesity (HCC)     Past Surgical History:  Procedure Laterality Date   ABDOMINAL HYSTERECTOMY     FRACTURE SURGERY Left    hand   IR KYPHO LUMBAR INC FX REDUCE BONE BX UNI/BIL CANNULATION INC/IMAGING  03/04/2023   KNEE ARTHROSCOPY Right 1990s       Scheduled Meds:  apixaban   5 mg Oral BID   furosemide  40 mg Intravenous BID   insulin  aspart  0-5 Units Subcutaneous QHS   insulin  aspart  0-6 Units Subcutaneous TID WC   metoprolol  succinate  50 mg Oral Daily   potassium chloride   40 mEq Oral BID   sodium chloride  flush  3 mL Intravenous Q12H   Continuous Infusions:  PRN Meds: acetaminophen  **OR** acetaminophen , HYDROmorphone  (DILAUDID ) injection, ketorolac , methocarbamol, ondansetron  **OR** ondansetron  (ZOFRAN ) IV, oxyCODONE , polyethylene glycol  Allergies:    Allergies  Allergen Reactions   Codeine Other (See Comments)    Unknown reaction   Crestor [Rosuvastatin] Other (See Comments)    Myalgias  Weakness    Social History:   Social History   Socioeconomic History   Marital status: Married    Spouse name: Not on file   Number of children: Not on file   Years of education: Not on file   Highest education level: Not on file  Occupational History   Not on file  Tobacco Use   Smoking status: Former    Current packs/day: 0.00    Average packs/day: 1 pack/day for 20.0 years (20.0 ttl pk-yrs)    Types: Cigarettes    Start date: 67    Quit date: 2010    Years since quitting: 15.4   Smokeless tobacco: Never  Vaping Use   Vaping status: Never Used  Substance and Sexual Activity   Alcohol use: Never   Drug use: Not Currently   Sexual activity: Yes  Other Topics Concern   Not on file  Social History Narrative   Not on file   Social Drivers of Health    Financial Resource Strain: Not on file  Food Insecurity: No Food Insecurity (02/24/2023)   Hunger Vital Sign    Worried About Running Out of Food in the Last Year: Never true    Ran Out of Food in the Last Year: Never true  Transportation Needs: No Transportation Needs (02/24/2023)   PRAPARE - Administrator, Civil Service (Medical): No    Lack of Transportation (Non-Medical): No  Physical Activity: Not on file  Stress: Not on file  Social Connections: Not on file  Intimate Partner Violence: Not At Risk (02/24/2023)   Humiliation, Afraid, Rape, and Kick questionnaire    Fear of Current or Ex-Partner: No    Emotionally Abused: No    Physically Abused: No    Sexually Abused:  No    Family History:    Family History  Problem Relation Age of Onset   Hypertension Mother    Heart attack Father    Heart disease Brother    Healthy Son    Breast cancer Neg Hx      ROS:  Please see the history of present illness.   All other ROS reviewed and negative.     Physical Exam/Data: Vitals:   10/03/23 0322 10/03/23 0328 10/03/23 0715 10/03/23 0914  BP:  125/81 136/77   Pulse:  (!) 125 (!) 112   Resp:  18 (!) 28   Temp: 98.3 F (36.8 C) 98.3 F (36.8 C)  (!) 97.5 F (36.4 C)  TempSrc: Oral Oral  Oral  SpO2:  96% 92%   Weight:      Height:        Intake/Output Summary (Last 24 hours) at 10/03/2023 1352 Last data filed at 10/03/2023 1110 Gross per 24 hour  Intake 211.64 ml  Output --  Net 211.64 ml      10/02/2023    5:06 PM 03/05/2023    4:03 AM 03/04/2023    4:18 AM  Last 3 Weights  Weight (lbs) 310 lb 10.1 oz 310 lb 10.1 oz 312 lb 1.6 oz  Weight (kg) 140.9 kg 140.9 kg 141.568 kg     Body mass index is 44.57 kg/m.  General:  Obese woman, in no acute distress HEENT: normal Neck: no JVD Vascular: No carotid bruits; Distal pulses 2+ bilaterally Cardiac: Irregular rhythm, tachycardic rate; no murmur  Lungs:  clear to auscultation bilaterally, no wheezing,  rhonchi or rales  Abd: soft, distended, tender to palpation all quadrants Ext: 3+ pitting edema bilaterally, edema extending to upper thigh. Musculoskeletal:  No deformities Skin: Scattered dry patches with small vesicles overlying peripheral edema Neuro:  CNs 2-12 intact, no focal abnormalities noted Psych:  Normal affect   EKG:  The EKG was personally reviewed and demonstrates: See HPI. Telemetry:  Telemetry was personally reviewed and demonstrates: Atrial fibrillation heart rate average 120s.  Relevant CV Studies: Echocardiogram 02/2023 IMPRESSIONS     1. Left ventricular ejection fraction, by estimation, is 60 to 65%. The  left ventricle has normal function. The left ventricle has no regional  wall motion abnormalities. There is mild left ventricular hypertrophy.   2. Right ventricular systolic function reduced. The right ventricular  size is grossly normal. There is normal pulmonary artery systolic  pressure.   3. Left atrial size was mildly dilated.   4. Right atrial size was moderately dilated.   5. The mitral valve is normal in structure. Mild mitral valve  regurgitation. No evidence of mitral stenosis.   6. The aortic valve was not well visualized. Aortic valve regurgitation  is trivial. No aortic stenosis is present.   7. Aortic dilatation noted. There is dilatation of the ascending aorta,  measuring 39 mm.   8. The inferior vena cava is dilated in size with >50% respiratory  variability, suggesting right atrial pressure of 8 mmHg.   Laboratory Data: High Sensitivity Troponin:  No results for input(s): TROPONINIHS in the last 720 hours.   Chemistry Recent Labs  Lab 10/02/23 1711 10/03/23 0523  NA 140 139  K 3.4* 2.9*  CL 107 107  CO2 25 21*  GLUCOSE 143* 160*  BUN 11 10  CREATININE 0.70 0.59  CALCIUM 9.0 8.6*  MG  --  1.5*  GFRNONAA >60 >60  ANIONGAP 8 11    Recent  Labs  Lab 10/02/23 1711  PROT 8.6*  ALBUMIN 3.6  AST 19  ALT 14  ALKPHOS 60   BILITOT 0.9   Lipids No results for input(s): CHOL, TRIG, HDL, LABVLDL, LDLCALC, CHOLHDL in the last 168 hours.  Hematology Recent Labs  Lab 10/02/23 1711 10/03/23 0523  WBC 10.2 11.5*  RBC 5.45* 5.25*  HGB 14.9 14.2  HCT 48.4* 46.9*  MCV 88.8 89.3  MCH 27.3 27.0  MCHC 30.8 30.3  RDW 14.2 14.1  PLT 289 254   Thyroid No results for input(s): TSH, FREET4 in the last 168 hours.  BNP Recent Labs  Lab 10/02/23 1730  BNP 189.8*    DDimer No results for input(s): DDIMER in the last 168 hours.  Radiology/Studies:  MR LUMBAR SPINE WO CONTRAST Result Date: 10/03/2023 CLINICAL DATA:  Low back pain, prior surgery, new symptoms EXAM: MRI LUMBAR SPINE WITHOUT CONTRAST TECHNIQUE: Multiplanar, multisequence MR imaging of the lumbar spine was performed. No intravenous contrast was administered. COMPARISON:  Plain radiographs the lumbar spine dated October 03, 2023 and MRI of the lumbar spine dated February 26, 2023. FINDINGS: Segmentation:  Standard Alignment: Focal kyphosis at L1 secondary to marked vertebral compression deformity status post recent kyphoplasty. Vertebrae: Since the previous MRI, the patient has undergone bilateral kyphoplasty at L1. There has also been significant interval worsening of the compression fracture and worsening retropulsion of the posterior wall of the vertebral body, which now protrudes approximately 9 mm posteriorly, resulting in moderate central spinal canal stenosis, which is worse on the left. There has also been interval development of a superior endplate compression deformity of L2, which appears to be chronic as there is no increased signal present on the STIR sequence. There are hemangiomas present within the L3 and L4 vertebrae. Conus medullaris and cauda equina: Conus extends to the midbody of L1 level. Conus and cauda equina appear normal. Paraspinal and other soft tissues: There is a simple appearing cyst again seen arising posteriorly from  the left kidney. No follow-up is necessary. The paraspinous soft tissues are otherwise unremarkable. Disc levels: T12-L1: There is a new small right paracentral protrusion seen best on the sagittal sequences, image 8. There is mild right-sided central spinal canal stenosis. The neural foramina are widely patent. L1-2: There is moderate central spinal canal stenosis secondary to retropulsion of the posteroinferior corner of the vertebral body. There is no impingement upon the conus medullaris or cauda equina. The neural foramina are patent. L2-3: Mild diffuse disc bulging and mild bilateral facet arthrosis with mild central spinal canal stenosis. The neural foramina are widely patent. L3-4: Normal. L4-5: Mild diffuse disc bulging, endplate ridging and facet hypertrophy, with mild central spinal canal stenosis and mild bilateral lateral recess stenosis. No apparent nerve root impingement. L5-S1: Normal. IMPRESSION: 1. Interval development of worsening central spinal canal stenosis at L1 secondary to interval worsening of a compression fracture at L1 and interval development of a superior endplate compression deformity of L2, which has lost approximately 20% of its height anteriorly. There is moderate focal kyphosis as a result. Electronically Signed   By: Evalene Coho M.D.   On: 10/03/2023 12:51   VAS US  LOWER EXTREMITY VENOUS (DVT) Result Date: 10/03/2023  Lower Venous DVT Study Patient Name:  Sharmin ELAINE Cosner  Date of Exam:   10/03/2023 Medical Rec #: [id number]            Accession #:    [id number] Date of Birth: 29-Mar-1961  Patient Gender: F Patient Age:   61 years Exam Location:  [facility] Procedure:      VAS US  LOWER EXTREMITY VENOUS (DVT) Referring Phys: TIMOTHY OPYD --------------------------------------------------------------------------------  Indications: Swelling, and Edema.  Risk Factors: DVT Hx of DVT. Limitations: Body habitus and Pt is unable to tolerate compression in  groin and thigh area therefore the contralateral groin is not assessed. Pt screams loudly, tenses and holds breath. Performing Technologist: Elmarie Lindau, RVT  Examination Guidelines: A complete evaluation includes B-mode imaging, spectral Doppler, color Doppler, and power Doppler as needed of all accessible portions of each vessel. Bilateral testing is considered an integral part of a complete examination. Limited examinations for reoccurring indications may be performed as noted. The reflux portion of the exam is performed with the patient in reverse Trendelenburg.  +---------+---------------+---------+-----------+----------+---------------+ RIGHT    CompressibilityPhasicitySpontaneityPropertiesThrombus Aging  +---------+---------------+---------+-----------+----------+---------------+ CFV      Full           Yes      Yes                                  +---------+---------------+---------+-----------+----------+---------------+ SFJ      Full                                                         +---------+---------------+---------+-----------+----------+---------------+ FV Prox  Full                                                         +---------+---------------+---------+-----------+----------+---------------+ FV Mid                  Yes                           full color flow +---------+---------------+---------+-----------+----------+---------------+ FV Distal               Yes                           full color flow +---------+---------------+---------+-----------+----------+---------------+ PFV      Full                                                         +---------+---------------+---------+-----------+----------+---------------+ POP      Full           Yes      Yes                                  +---------+---------------+---------+-----------+----------+---------------+ PTV      Full                                                          +---------+---------------+---------+-----------+----------+---------------+  PERO     Full                                                         +---------+---------------+---------+-----------+----------+---------------+       Summary: RIGHT: - There is no evidence of deep vein thrombosis in the lower extremity.  - No cystic structure found in the popliteal fossa.   *See table(s) above for measurements and observations.    Preliminary    DG Lumbar Spine 2-3 Views Result Date: 10/03/2023 EXAM: 2 or 3 VIEW(S) XRAY OF THE LUMBAR SPINE 10/03/2023 04:26:00 AM COMPARISON: Lumbar spine radiographs. CLINICAL HISTORY: Pain, best images obtained due to patient condition and body habitus. FINDINGS: LUMBAR SPINE: BONES: Spinal augmentation is present at L1. The L1 fracture demonstrates progressive loss of height since the prior exam. Methyl methacrylate is present outside of the vertebral body. A superior endplate fracture at L2 is new since the prior exam. Exaggerated kyphosis is present. DISCS AND DEGENERATIVE CHANGES: No severe degenerative changes. The discs are maintained. SOFT TISSUES: No acute abnormality. VASCULATURE: Atherosclerotic calcifications are present in the aorta. IMPRESSION: 1. Progressive loss of height of the L1 fracture with methyl methacrylate present outside of the vertebral body. 2. New superior endplate fracture at L2. 3. Exaggerated kyphosis. Electronically signed by: Lonni Necessary MD 10/03/2023 05:06 AM EDT RP Workstation: [id number]   DG Chest 2 View Result Date: 10/02/2023 CLINICAL DATA:  Shortness of breath EXAM: CHEST - 2 VIEW COMPARISON:  02/24/2023 FINDINGS: Cardiomegaly, vascular congestion. No overt edema, confluent opacities or effusions. No acute bony abnormality. IMPRESSION: Cardiomegaly, vascular congestion. Electronically Signed   By: Franky Crease M.D.   On: 10/02/2023 18:26     Assessment and Plan: Atrial fibrillation RVR Patient was first  diagnosed in 2021 by PCP and referred to heart care.  Patient declined further intervention such as DCCV or medication changes.  She also stopped taking anticoagulation.  In November 2024 patient was admitted and found incidentally in A-fib RVR, Heart care was consulted.  She was discharged on metoprolol  succinate 100 mg twice daily, diltiazem  360 mg daily, and Eliquis  5 mg twice daily.  Patient soon after discharge patient stopped taking Eliquis , diltiazem , and she reduced the dose of the metoprolol  due to arm pain.  She presented to the ED 10/02/2023 due to back pain.  She was found to be in atrial fibrillation RVR (HR 120s).  She denies lightheadedness, dizziness, palpitations, or chest pain. She has been given 1 dose of metoprolol  succinate 50 mg in the ED.  CHADS2-VASc score 4.  Patient has historically declined rhythm control options such as DCCV.  Rate control has been difficult due to medication noncompliance and side effects.  Suspect patient has been in atrial fibrillation for some time as she is asymptomatic, could consider offering DCCV outpatient if patient is agreeable to anticoagulation for 3 weeks and 4 weeks post intervention.   Spoke with patient extensively about anticoagulation.  She does have concerns about bleeding risk after reading the Eliquis  pamphlet.  She denies history of GI bleed.  More importantly, she reports that she is unable to afford Eliquis  as it is over $100 per month for her.  She is welcome to ongoing anticoagulation discussions.  She understands that the anticoagulation is important for her to reduce her stroke risk.  Will need to continue to educate patient on importance of anticoagulation, and reach out to pharmacy to see more affordable options for patient.  It appears patient did take Eliquis  this morning, however unless we can get financial assistance this will not be a practical medication at discharge -discontinue Eliquis  5 mg twice daily -start warfarin per  pharmacy -change metoprolol  succinate 50 mg daily to metoprolol  succinate 50 mg twice daily for improved rate control  Suspected HFpEF exacerbation Hypokalemia During November 2024 admission, patient reported DOE and peripheral edema.  At that time it was felt to be multifactorial from obesity, atrial fibrillation, potentially undiagnosed OSA, and may be a component of diastolic heart failure though no official diagnosis.  On interview today , patient reports that the DOE has not changed since her last admission in November, however the peripheral edema has worsened now extending to her abdomen. BNP 189.8  K 3.4->2.9 (K is being repleted) CXR showed cardiomegaly and vascular congestion She has received no IV diuresis, though IV Lasix 40 mg is scheduled for this evening  Last echocardiogram was in November 2024, showed EF 60 to 65% with mild LVH and reduced right ventricular systolic function.  Mildly dilated LA and moderately dilated RA.    Would like to investigate etiology of peripheral edema. Patient reports worsening peripheral edema, now with abdominal distention since last echocardiogram.  Would like to obtain new echocardiogram to better assess any changes in cardiac function/effect of persistent atrial fibrillation.  Patient is on amlodipine , though I do not think this is the main reason for her peripheral edema would discontinue this medication.   -Strict I/Os -Continue IV Lasix 40 mg twice daily -Metoprolol  succinate 50 mg twice daily as described above -Restart losartan  100 mg scheduled to start tomorrow morning. Could consider transition to Entresto, however would need to speak to pharmacy and see if it would be affordable for patient prior to initiation.  -Hold off on MRA and restarting PTA jardiance  at this time with ongoing medication changes.   Continue to closely monitor K with IV lasix and restart of losartan . Last K 2.9, it is currently being repleted IV.  Hypertension BP:  133/80. PTA medications include amlodipine  10 mg, would discontinue at discharge due to peripheral edema. (See above) -Restart losartan  100 mg, scheduled to start tomorrow morning  Type 2 diabetes A1c 6.2% -continue to hold PTA Jardiance  10 mg for now with ongoing medication changes -continue sliding scale insulin   Risk Assessment/Risk Scores:       [place]  Heart Association (NYHA) Functional Class NYHA Class III  CHA2DS2-VASc Score = 4   This indicates a 4.8% annual risk of stroke. The patient's score is based upon: CHF History: 1 HTN History: 1 Diabetes History: 1 Stroke History: 0 Vascular Disease History: 0 Age Score: 0 Gender Score: 1        For questions or updates, please contact  HeartCare Please consult [email or web address] for contact info under    Signed, Leontine LOISE Salen, PA-C  10/03/2023 1:52 PM  Personally seen and examined. Agree with above.  63 year old with longstanding history of persistent atrial fibrillation diabetes morbid obesity with challenges regarding medications, nonuse of Eliquis , reduction in metoprolol  due to arm discomfort.  On exam she is irregularly irregular tachycardic. No DVT on ultrasound. EKG with A-fib 117.  A-fib RVR Acute diastolic heart failure -We will give her metoprolol  tartrate 50 mg twice a day for improved rate control. -Discussed importance of anticoagulation.  She does not wish  to take Eliquis . -We have ordered warfarin for her per pharmacy dosing.  CHA2DS2-VASc is 4. -Check echocardiogram -IV Lasix 40 mg twice a day with further treatment of hypokalemia. -Could consider transition from losartan  to Entresto but if cost of medications has been a concern to her this may be a barrier.  Oneil Parchment, MD

## 2023-10-03 NOTE — ED Provider Notes (Signed)
 [facility]ENC[facility] DEPT [facility] Emergency Department Provider Note MRN:  [id number]  Arrival date & time: 10/03/23     Chief Complaint   Leg Swelling   History of Present Illness   Tracie [name] is a 63 y.o. year-old female presents to the ED with chief complaint of low back pain and bilateral lower extremity swelling.  She states that she had kyphoplasty back in November 2024.  States that she has not been able to sleep in her bed, and she sleeps sitting up.  She states that she has had progressively worsening lower extremity swelling and edema.  She states that the swelling makes it hard for her to walk.  She reports some associated shortness of breath.  Denies history of CHF, but does have history of A-fib and takes Eliquis .  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  She denies fevers or chills.  She states that she talked with her doctor about the leg swelling, and was advised to come to the emergency department for further evaluation.  History provided by patient.   Review of Systems  Pertinent positive and negative review of systems noted in HPI.    Physical Exam   Vitals:   10/03/23 0322 10/03/23 0328  BP:  125/81  Pulse:  (!) 125  Resp:  18  Temp: 98.3 F (36.8 C) 98.3 F (36.8 C)  SpO2:  96%    CONSTITUTIONAL:  non toxic-appearing, NAD NEURO:  Alert and oriented x 3, CN 3-12 grossly intact EYES:  eyes equal and reactive ENT/NECK:  Supple, no stridor  CARDIO:  normal rate, regular rhythm, appears well-perfused  PULM:  No respiratory distress, CTAB GI/GU:  non-distended, non focal tednerness MSK/SPINE:  No gross deformities, lower extremity swelling bilaterally, moves all extremities  SKIN:  no rash, atraumatic   *Additional and/or pertinent findings included in MDM below  Diagnostic and Interventional Summary    EKG Interpretation Date/Time:  Thursday October 03 2023 03:41:42 EDT Ventricular Rate:  117 PR Interval:    QRS  Duration:  86 QT Interval:  344 QTC Calculation: 479 R Axis:   57  Text Interpretation: Atrial fibrillation with rapid ventricular response Nonspecific ST abnormality Abnormal ECG When compared with ECG of 02-Oct-2023 17:27, No significant change was found Confirmed by Raford Lenis ([id number]) on 10/03/2023 3:44:30 AM       Labs Reviewed  CBC WITH DIFFERENTIAL/PLATELET - Abnormal; Notable for the following components:      Result Value   RBC 5.45 (*)    HCT 48.4 (*)    All other components within normal limits  COMPREHENSIVE METABOLIC PANEL WITH GFR - Abnormal; Notable for the following components:   Potassium 3.4 (*)    Glucose, Bld 143 (*)    Total Protein 8.6 (*)    All other components within normal limits  BRAIN NATRIURETIC PEPTIDE - Abnormal; Notable for the following components:   B Natriuretic Peptide 189.8 (*)    All other components within normal limits    DG Chest 2 View  Final Result    DG Lumbar Spine Complete    (Results Pending)    Medications  methocarbamol (ROBAXIN) tablet 750 mg (750 mg Oral Given 10/02/23 1807)  HYDROmorphone  (DILAUDID ) injection 1 mg (1 mg Intravenous Given 10/03/23 0256)  HYDROmorphone  (DILAUDID ) injection 0.5 mg (0.5 mg Intravenous Given 10/03/23 0308)  ketorolac  (TORADOL ) 30 MG/ML injection 30 mg (30 mg Intravenous Given 10/03/23 0346)  HYDROmorphone  (DILAUDID ) injection 1 mg (1  mg Intravenous Given 10/03/23 0346)     Procedures  /  Critical Care Procedures  ED Course and Medical Decision Making  I have reviewed the triage vital signs, the nursing notes, and pertinent available records from the EMR.  Social Determinants Affecting Complexity of Care: Patient has no clinically significant social determinants affecting this chief complaint..   ED Course: Clinical Course as of 10/03/23 0409  Thu Oct 03, 2023  0333 Patient was unable to tolerate MRI due to pain with lying flat.  This is the same pain she has been experiencing since November.   She has not ever followed up with spine.  Given that the symptoms are not any different, I do not know that she has to have further emergent workup tonight.  I did offer CT which would be faster, but she does not think she could tolerate that either.  Will try plain films. [RB]  0405 Patient still mildly tachycardic and EKG shows persistent A-fib.  This is similar to where she was 6 or 7 months ago when she was admitted for kyphoplasty.  However, given her significant lower extremity swelling, vascular congestion on chest x-ray, and elevated BNP with associated shortness of breath and dyspnea on exertion at home, think that she may benefit from hospitalization for diuresis.  She may need echocardiogram. [RB]    Clinical Course User Index [RB] Vicky Charleston, PA-C    Medical Decision Making Patient here with lower extremity swelling.  She has had worsening symptoms for the past days to weeks.  She was seen by her PCP and was advised to come into the emergency department for evaluation.  She also states that she has persistent low back pain since having kyphoplasty approximately 7 months ago.  She states that she is not able to lay flat at night because of the pain, and thinks that this may be contributing to her swelling.  She does have history of A-fib, which appears to be persistent.  She is tachycardic to the 110 could be that her A-fib is causing some heart failure given her new shortness of breath, elevated BNP, and vascular congestion on chest x-ray.    I think that she would benefit from an admission.  May also benefit from PT/OT evaluation for her low back pain.  Amount and/or Complexity of Data Reviewed Radiology: ordered. ECG/medicine tests: ordered.  Risk Prescription drug management. Decision regarding hospitalization.         Consultants: I consulted with Hospitalist, Dr. Charlton, who is appreciated for admitting.   Treatment and Plan: Patient's exam and diagnostic  results are concerning for decompensated a-fib and CHF exacerbation.  Feel that patient will need admission to the hospital for further treatment and evaluation.    Final Clinical Impressions(s) / ED Diagnoses     ICD-10-CM   1. SOB (shortness of breath)  R06.02     2. Persistent atrial fibrillation (HCC)  I48.19     3. Leg edema  R60.0     4. Chronic midline low back pain without sciatica  M54.50    G89.29       ED Discharge Orders     None         Discharge Instructions Discussed with and Provided to Patient:   Discharge Instructions   None      Vicky Charleston, PA-C 10/03/23 0409    Raford Lenis, MD 10/03/23 [phone]

## 2023-10-03 NOTE — Progress Notes (Signed)
 RLE venous duplex is complete.  Prelim report is in CV Proc. Kiet Geer, RVT

## 2023-10-03 NOTE — Progress Notes (Signed)
 OT Cancellation Note  Patient Details Name: Tracie [name] MRN: [id number] DOB: March 01, 1961   Cancelled Treatment:    Reason Eval/Treat Not Completed: Medical issues which prohibited therapy (Note imaging revealed L1 and L2 fxs. Attending agreed for PT/OT to hold until further work up. Await clarification and potential ortho consult prior to OT eval).  Mliss Fish 10/03/2023, 10:56 AM

## 2023-10-03 NOTE — ED Notes (Signed)
 Patient transported to vascular.

## 2023-10-03 NOTE — ED Notes (Signed)
 Assisted pt to the bed pan. Sheet got wet, I offered to change the sheet, pt states that just put something under for now, I put a chuck under.

## 2023-10-03 NOTE — Progress Notes (Signed)
 Courtesy note No billing-  Patient is seen and examined today morning. Patient admitted to [facility] service for CHF exacerbation, found to have L1- L2 fracture. She has Afib with RVR. She is complaining of back pain, unable to ambulate. Got short of breath with dilaudid . Does not want to take Eliquis , states cardizem  made her sick.   She will be continued on IV Lasix, beta blocker, eliquis . Potassium low will be replaced. Neurosurgery team consulted- who advised MRI lumbar spine for further management. Continue pain control. She does not want dilaudid , torodol ordered. PT/ OT after MRI and neurosurgery eval.

## 2023-10-03 NOTE — ED Notes (Addendum)
 Pt began hyperventilating and stating she could not breathe and needed to be sitting up. Patient oxygen saturation was 99%. This nurse at her up and placed her on 2L and patient began to calm down.

## 2023-10-03 NOTE — Consult Note (Signed)
 Reason for Consult: Increasing back pain Referring Physician: Dr. Darci Perry Tracie [name] is an 63 y.o. female.  HPI: Patient is a 63 year old individual who had an L1 compression fracture back in November.  She was treated with an acrylic balloon kyphoplasty.  She was discharged home soon after that but notes that she has Chronic pain so the pain is improved in some regards its worsened and others she notes that she has a great deal of difficulty moving and cannot get into bed on her own.  She is brought into the emergency room the other day because of back pain she has significant leg swelling.  She also is in chronic atrial fibrillation and is now being evaluated for congestive failure.  An MRI of the lumbar spine demonstrates that she now has a superior endplate fracture of the L2 vertebrae with a kyphotic deformity across L1 and significant spinal stenosis secondary to some retropulsed bone at the posterior aspect of L1.  This creates a moderate spinal stenosis across L1. It seems that the patient's general performance level in the past number of months has been very poor.  Past Medical History:  Diagnosis Date   Atrial fibrillation (HCC)    Celiac artery dissection (HCC)    Diabetes mellitus (HCC)    History of DVT (deep vein thrombosis) 2010   Hyperlipidemia    Hypertension    Morbid obesity (HCC)     Past Surgical History:  Procedure Laterality Date   ABDOMINAL HYSTERECTOMY     FRACTURE SURGERY Left    hand   IR KYPHO LUMBAR INC FX REDUCE BONE BX UNI/BIL CANNULATION INC/IMAGING  03/04/2023   KNEE ARTHROSCOPY Right 1990s    Family History  Problem Relation Age of Onset   Hypertension Mother    Heart attack Father    Heart disease Brother    Healthy Son    Breast cancer Neg Hx     Social History:  reports that she quit smoking about 15 years ago. Her smoking use included cigarettes. She started smoking about 35 years ago. She has a 20 pack-year smoking history. She has  never used smokeless tobacco. She reports that she does not currently use drugs. She reports that she does not drink alcohol.  Allergies:  Allergies  Allergen Reactions   Codeine Other (See Comments)    Unknown reaction   Crestor [Rosuvastatin] Other (See Comments)    Myalgias  Weakness    Medications: I have reviewed the patient's current medications.  Results for orders placed or performed during the hospital encounter of 10/02/23 (from the past 48 hours)  CBC with Differential     Status: Abnormal   Collection Time: 10/02/23  5:11 PM  Result Value Ref Range   WBC 10.2 4.0 - 10.5 K/uL   RBC 5.45 (H) 3.87 - 5.11 MIL/uL   Hemoglobin 14.9 12.0 - 15.0 g/dL   HCT 51.5 (H) 63.9 - 53.9 %   MCV 88.8 80.0 - 100.0 fL   MCH 27.3 26.0 - 34.0 pg   MCHC 30.8 30.0 - 36.0 g/dL   RDW 85.7 88.4 - 84.4 %   Platelets 289 150 - 400 K/uL   nRBC 0.0 0.0 - 0.2 %   Neutrophils Relative % 74 %   Neutro Abs 7.7 1.7 - 7.7 K/uL   Lymphocytes Relative 18 %   Lymphs Abs 1.8 0.7 - 4.0 K/uL   Monocytes Relative 5 %   Monocytes Absolute 0.5 0.1 - 1.0 K/uL  Eosinophils Relative 2 %   Eosinophils Absolute 0.2 0.0 - 0.5 K/uL   Basophils Relative 1 %   Basophils Absolute 0.1 0.0 - 0.1 K/uL   Immature Granulocytes 0 %   Abs Immature Granulocytes 0.03 0.00 - 0.07 K/uL    Comment: Performed at [facility], [address], [place], [place] [address]  Comprehensive metabolic panel     Status: Abnormal   Collection Time: 10/02/23  5:11 PM  Result Value Ref Range   Sodium 140 135 - 145 mmol/L   Potassium 3.4 (L) 3.5 - 5.1 mmol/L   Chloride 107 98 - 111 mmol/L   CO2 25 22 - 32 mmol/L   Glucose, Bld 143 (H) 70 - 99 mg/dL    Comment: Glucose reference range applies only to samples taken after fasting for at least 8 hours.   BUN 11 8 - 23 mg/dL   Creatinine, Ser 9.29 0.44 - 1.00 mg/dL   Calcium 9.0 8.9 - 89.6 mg/dL   Total Protein 8.6 (H) 6.5 - 8.1 g/dL   Albumin 3.6 3.5 - 5.0 g/dL   AST 19 15 - 41  U/L   ALT 14 0 - 44 U/L   Alkaline Phosphatase 60 38 - 126 U/L   Total Bilirubin 0.9 0.0 - 1.2 mg/dL   GFR, Estimated >39 >39 mL/min    Comment: (NOTE) Calculated using the CKD-EPI Creatinine Equation (2021)    Anion gap 8 5 - 15    Comment: Performed at [facility], [address] [place], [place] [address]  Brain natriuretic peptide     Status: Abnormal   Collection Time: 10/02/23  5:30 PM  Result Value Ref Range   B Natriuretic Peptide 189.8 (H) 0.0 - 100.0 pg/mL    Comment: Performed at [facility], [address] [place], [place] [address]  Basic metabolic panel     Status: Abnormal   Collection Time: 10/03/23  5:23 AM  Result Value Ref Range   Sodium 139 135 - 145 mmol/L   Potassium 2.9 (L) 3.5 - 5.1 mmol/L   Chloride 107 98 - 111 mmol/L   CO2 21 (L) 22 - 32 mmol/L   Glucose, Bld 160 (H) 70 - 99 mg/dL    Comment: Glucose reference range applies only to samples taken after fasting for at least 8 hours.   BUN 10 8 - 23 mg/dL   Creatinine, Ser 9.40 0.44 - 1.00 mg/dL   Calcium 8.6 (L) 8.9 - 10.3 mg/dL   GFR, Estimated >39 >39 mL/min    Comment: (NOTE) Calculated using the CKD-EPI Creatinine Equation (2021)    Anion gap 11 5 - 15    Comment: Performed at [facility], [address], [place], [place] [address]  Magnesium      Status: Abnormal   Collection Time: 10/03/23  5:23 AM  Result Value Ref Range   Magnesium  1.5 (L) 1.7 - 2.4 mg/dL    Comment: Performed at [facility], [address], [place], [place] [address]  CBC     Status: Abnormal   Collection Time: 10/03/23  5:23 AM  Result Value Ref Range   WBC 11.5 (H) 4.0 - 10.5 K/uL   RBC 5.25 (H) 3.87 - 5.11 MIL/uL   Hemoglobin 14.2 12.0 - 15.0 g/dL   HCT 53.0 (H) 63.9 - 53.9 %   MCV 89.3 80.0 - 100.0 fL   MCH 27.0 26.0 - 34.0 pg   MCHC 30.3  30.0 - 36.0 g/dL   RDW 85.8 88.4 - 84.4 %   Platelets 254 150 - 400 K/uL   nRBC 0.0 0.0 - 0.2 %    Comment: Performed at [facility],  [address] [place], [place] [address]  Hemoglobin A1c     Status: Abnormal   Collection Time: 10/03/23  5:24 AM  Result Value Ref Range   Hgb A1c MFr Bld 6.2 (H) 4.8 - 5.6 %    Comment: (NOTE) Diagnosis of Diabetes The following HbA1c ranges recommended by the American Diabetes Association (ADA) may be used as an aid in the diagnosis of diabetes mellitus.  Hemoglobin             Suggested A1C NGSP%              Diagnosis  <5.7                   Non Diabetic  5.7-6.4                Pre-Diabetic  >6.4                   Diabetic  <7.0                   Glycemic control for                       adults with diabetes.     Mean Plasma Glucose 131.24 mg/dL    Comment: Performed at [facility], [address] [place], [place] [address]  CBG monitoring, ED     Status: Abnormal   Collection Time: 10/03/23  7:36 AM  Result Value Ref Range   Glucose-Capillary 157 (H) 70 - 99 mg/dL    Comment: Glucose reference range applies only to samples taken after fasting for at least 8 hours.  CBG monitoring, ED     Status: Abnormal   Collection Time: 10/03/23 12:41 PM  Result Value Ref Range   Glucose-Capillary 167 (H) 70 - 99 mg/dL    Comment: Glucose reference range applies only to samples taken after fasting for at least 8 hours.  Glucose, capillary     Status: Abnormal   Collection Time: 10/03/23  6:44 PM  Result Value Ref Range   Glucose-Capillary 156 (H) 70 - 99 mg/dL    Comment: Glucose reference range applies only to samples taken after fasting for at least 8 hours.    MR LUMBAR SPINE WO CONTRAST Result Date: 10/03/2023 CLINICAL DATA:  Low back pain, prior surgery, new symptoms EXAM: MRI LUMBAR SPINE WITHOUT CONTRAST TECHNIQUE: Multiplanar, multisequence MR imaging of the lumbar spine was performed. No intravenous contrast was administered. COMPARISON:  Plain radiographs the lumbar spine dated October 03, 2023 and MRI of the lumbar spine dated February 26, 2023. FINDINGS:  Segmentation:  Standard Alignment: Focal kyphosis at L1 secondary to marked vertebral compression deformity status post recent kyphoplasty. Vertebrae: Since the previous MRI, the patient has undergone bilateral kyphoplasty at L1. There has also been significant interval worsening of the compression fracture and worsening retropulsion of the posterior wall of the vertebral body, which now protrudes approximately 9 mm posteriorly, resulting in moderate central spinal canal stenosis, which is worse on the left. There has also been interval development of a superior endplate compression deformity of L2, which appears to be chronic as there is no increased signal present on the STIR sequence. There are hemangiomas  present within the L3 and L4 vertebrae. Conus medullaris and cauda equina: Conus extends to the midbody of L1 level. Conus and cauda equina appear normal. Paraspinal and other soft tissues: There is a simple appearing cyst again seen arising posteriorly from the left kidney. No follow-up is necessary. The paraspinous soft tissues are otherwise unremarkable. Disc levels: T12-L1: There is a new small right paracentral protrusion seen best on the sagittal sequences, image 8. There is mild right-sided central spinal canal stenosis. The neural foramina are widely patent. L1-2: There is moderate central spinal canal stenosis secondary to retropulsion of the posteroinferior corner of the vertebral body. There is no impingement upon the conus medullaris or cauda equina. The neural foramina are patent. L2-3: Mild diffuse disc bulging and mild bilateral facet arthrosis with mild central spinal canal stenosis. The neural foramina are widely patent. L3-4: Normal. L4-5: Mild diffuse disc bulging, endplate ridging and facet hypertrophy, with mild central spinal canal stenosis and mild bilateral lateral recess stenosis. No apparent nerve root impingement. L5-S1: Normal. IMPRESSION: 1. Interval development of worsening central  spinal canal stenosis at L1 secondary to interval worsening of a compression fracture at L1 and interval development of a superior endplate compression deformity of L2, which has lost approximately 20% of its height anteriorly. There is moderate focal kyphosis as a result. Electronically Signed   By: Evalene Coho M.D.   On: 10/03/2023 12:51   VAS US  LOWER EXTREMITY VENOUS (DVT) Result Date: 10/03/2023  Lower Venous DVT Study Patient Name:  Tracie ELAINE Marinez  Date of Exam:   10/03/2023 Medical Rec #: [id number]            Accession #:    [id number] Date of Birth: July 18, 1960            Patient Gender: F Patient Age:   83 years Exam Location:  [facility] Procedure:      VAS US  LOWER EXTREMITY VENOUS (DVT) Referring Phys: TIMOTHY OPYD --------------------------------------------------------------------------------  Indications: Swelling, and Edema.  Risk Factors: DVT Hx of DVT. Limitations: Body habitus and Pt is unable to tolerate compression in groin and thigh area therefore the contralateral groin is not assessed. Pt screams loudly, tenses and holds breath. Performing Technologist: Elmarie Lindau, RVT  Examination Guidelines: A complete evaluation includes B-mode imaging, spectral Doppler, color Doppler, and power Doppler as needed of all accessible portions of each vessel. Bilateral testing is considered an integral part of a complete examination. Limited examinations for reoccurring indications may be performed as noted. The reflux portion of the exam is performed with the patient in reverse Trendelenburg.  +---------+---------------+---------+-----------+----------+---------------+ RIGHT    CompressibilityPhasicitySpontaneityPropertiesThrombus Aging  +---------+---------------+---------+-----------+----------+---------------+ CFV      Full           Yes      Yes                                  +---------+---------------+---------+-----------+----------+---------------+ SFJ      Full                                                          +---------+---------------+---------+-----------+----------+---------------+ FV Prox  Full                                                         +---------+---------------+---------+-----------+----------+---------------+  FV Mid                  Yes                           full color flow +---------+---------------+---------+-----------+----------+---------------+ FV Distal               Yes                           full color flow +---------+---------------+---------+-----------+----------+---------------+ PFV      Full                                                         +---------+---------------+---------+-----------+----------+---------------+ POP      Full           Yes      Yes                                  +---------+---------------+---------+-----------+----------+---------------+ PTV      Full                                                         +---------+---------------+---------+-----------+----------+---------------+ PERO     Full                                                         +---------+---------------+---------+-----------+----------+---------------+       Summary: RIGHT: - There is no evidence of deep vein thrombosis in the lower extremity.  - No cystic structure found in the popliteal fossa.   *See table(s) above for measurements and observations.    Preliminary    DG Lumbar Spine 2-3 Views Result Date: 10/03/2023 EXAM: 2 or 3 VIEW(S) XRAY OF THE LUMBAR SPINE 10/03/2023 04:26:00 AM COMPARISON: Lumbar spine radiographs. CLINICAL HISTORY: Pain, best images obtained due to patient condition and body habitus. FINDINGS: LUMBAR SPINE: BONES: Spinal augmentation is present at L1. The L1 fracture demonstrates progressive loss of height since the prior exam. Methyl methacrylate is present outside of the vertebral body. A superior endplate fracture at L2 is new since the  prior exam. Exaggerated kyphosis is present. DISCS AND DEGENERATIVE CHANGES: No severe degenerative changes. The discs are maintained. SOFT TISSUES: No acute abnormality. VASCULATURE: Atherosclerotic calcifications are present in the aorta. IMPRESSION: 1. Progressive loss of height of the L1 fracture with methyl methacrylate present outside of the vertebral body. 2. New superior endplate fracture at L2. 3. Exaggerated kyphosis. Electronically signed by: Lonni Necessary MD 10/03/2023 05:06 AM EDT RP Workstation: [id number]   DG Chest 2 View Result Date: 10/02/2023 CLINICAL DATA:  Shortness of breath EXAM: CHEST - 2 VIEW COMPARISON:  02/24/2023 FINDINGS: Cardiomegaly, vascular congestion. No overt edema, confluent opacities or effusions. No acute bony abnormality. IMPRESSION: Cardiomegaly, vascular congestion. Electronically Signed   By: Franky Crease M.D.   On:  10/02/2023 18:26    Review of Systems  Constitutional:  Positive for activity change.  Musculoskeletal:  Positive for back pain and gait problem.   Blood pressure 135/86, pulse (!) 106, temperature 98.2 F (36.8 C), temperature source Oral, resp. rate 20, height 5' 10.5 (1.791 m), weight (!) 150.5 kg, SpO2 92%. Physical Exam Constitutional:      Appearance: She is obese.  HENT:     Head: Normocephalic and atraumatic.     Right Ear: Tympanic membrane, ear canal and external ear normal.     Left Ear: Tympanic membrane, ear canal and external ear normal.   Musculoskeletal:     Comments: Substantial peripheral edema with pitting edema in both ankles.   Skin:    General: Skin is warm and dry.     Capillary Refill: Capillary refill takes less than 2 seconds.   Neurological:     Mental Status: She is alert.     Comments: Patient's motor function is intact in the lower extremities in iliopsoas quadriceps tibialis anterior and gastroc to confrontational testing deep tendon reflexes are absent in the patella and the Achilles both.   Sensation appears intact to pin light touch and vibration in the distal lower extremities also in the upper extremities it is normal cranial nerve examination is normal.    Assessment/Plan: L1 compression fracture with moderate spinal stenosis.  Plan: The patient is an extremely poor surgical risk given her history of congestive failure poor performance status osteoporosis morbid obesity in addition to age over 65.  I also note the morphology of her spine is such that placing hardware across the thoracolumbar junction to stabilize this fracture would be difficult secondary to the presence of small pedicles.  I believe would be best to treat this lady most conservatively a program of significant weight loss would certainly help her overall situation and her mobility.  However at this time I feel the risk of surgery is high and the chance of significant improvement is low.  I therefore do not recommend any surgical intervention for this process.  Victory PARAS Sedrick Tober 10/03/2023, 9:02 PM

## 2023-10-03 NOTE — ED Notes (Signed)
 Pt unable to tolerate MRI advised PA Browning more pain medication ordered.

## 2023-10-03 NOTE — H&P (Signed)
 History and Physical    Tracie [name] FMW:[id number] DOB: 1961/01/24 DOA: 10/02/2023  PCP: Rolinda Millman, MD   Patient coming from: Home   Chief Complaint: Leg swelling, DOE, low back pain   HPI: Tracie [name] is a 63 y.o. female with medical history significant for hypertension, type 2 diabetes mellitus, history of DVT, PAF on Eliquis , chronic HFpEF, and chronic low back pain who presents with worsening bilateral lower extremity swelling, shortness of breath, and back pain.  Patient reports 6 or 7 months of progressive bilateral lower extremity swelling that has been worse on the right side.  She has also experienced insidiously worsening exertional dyspnea.  She has been sleeping sitting up for the past 6 or 7 months, not due to orthopnea, but due to her chronic low back pain which is worse when laying flat.  She has not been taking diltiazem  or Eliquis  in several months but does not remember why she stopped them.  She denies any history of bleeding problems.  She denies leg weakness, saddle anesthesia, or change in bowel or bladder habits.  She denies fever or chills.  ED Course: Upon arrival to the ED, patient is found to be afebrile and saturating mid 90s on room air with elevated heart rate and stable blood pressure.  Labs are most notable for potassium 3.4, normal creatinine, normal WBC, and BNP 190.  Chest x-ray demonstrates cardiomegaly and vascular congestion.  EKG reveals atrial fibrillation with rate 117.  Patient was given 3 doses of Dilaudid , Toradol , and Robaxin in the ED.  Review of Systems:  All other systems reviewed and apart from HPI, are negative.  Past Medical History:  Diagnosis Date   Atrial fibrillation (HCC)    Celiac artery dissection (HCC)    Diabetes mellitus (HCC)    History of DVT (deep vein thrombosis) 2010   Hyperlipidemia    Hypertension    Morbid obesity (HCC)     Past Surgical History:  Procedure Laterality Date   ABDOMINAL  HYSTERECTOMY     FRACTURE SURGERY Left    hand   IR KYPHO LUMBAR INC FX REDUCE BONE BX UNI/BIL CANNULATION INC/IMAGING  03/04/2023   KNEE ARTHROSCOPY Right 1990s    Social History:   reports that she quit smoking about 15 years ago. Her smoking use included cigarettes. She started smoking about 35 years ago. She has a 20 pack-year smoking history. She has never used smokeless tobacco. She reports that she does not currently use drugs. She reports that she does not drink alcohol.  Allergies  Allergen Reactions   Codeine Other (See Comments)    Unknown reaction   Crestor [Rosuvastatin] Other (See Comments)    Myalgias  Weakness    Family History  Problem Relation Age of Onset   Hypertension Mother    Heart attack Father    Heart disease Brother    Healthy Son    Breast cancer Neg Hx      Prior to Admission medications   Medication Sig Start Date End Date Taking? Authorizing Provider  apixaban  (ELIQUIS ) 5 MG TABS tablet Take 1 tablet (5 mg total) by mouth 2 (two) times daily. 03/05/23   Lue Elsie BROCKS, MD  celecoxib  (CELEBREX ) 100 MG capsule Take 100 mg by mouth daily.    [provider]  cyclobenzaprine  (FLEXERIL ) 5 MG tablet Take 1 tablet (5 mg total) by mouth 3 (three) times daily as needed for muscle spasms. 03/05/23   Lue Elsie BROCKS, MD  diltiazem  (  CARDIZEM  CD) 360 MG 24 hr capsule Take 1 capsule (360 mg total) by mouth daily. 03/06/23   Lue Elsie BROCKS, MD  empagliflozin  (JARDIANCE ) 25 MG TABS tablet Take 25 mg by mouth daily.    [provider]  HYDROcodone -acetaminophen  (NORCO/VICODIN) 5-325 MG tablet Take 1-2 tablets by mouth every 6 (six) hours as needed for moderate pain (pain score 4-6). 03/05/23   Lue Elsie BROCKS, MD  lidocaine  (LIDODERM ) 5 % Place 1 patch onto the skin daily. Remove & Discard patch within 12 hours or as directed by MD Patient not taking: Reported on 02/24/2023 02/22/23   Smoot, Lauraine LABOR, PA-C  losartan  (COZAAR )  100 MG tablet TAKE 1 TABLET(100 MG) BY MOUTH DAILY 02/26/20   Levora Reyes SAUNDERS, MD  metoprolol  succinate (TOPROL -XL) 100 MG 24 hr tablet Take 1 tablet (100 mg total) by mouth 2 (two) times daily. Take with or immediately following a meal. 03/05/23   Lue Elsie BROCKS, MD  pantoprazole  (PROTONIX ) 40 MG tablet Take 1 tablet (40 mg total) by mouth 2 (two) times daily. 03/05/23   Lue Elsie BROCKS, MD  vitamin B-12 (CYANOCOBALAMIN) 500 MCG tablet Take 500 mcg by mouth daily.    [provider]    Physical Exam: Vitals:   10/02/23 1931 10/03/23 0003 10/03/23 0322 10/03/23 0328  BP: (!) 157/93 (!) 162/103  125/81  Pulse: 97 100  (!) 125  Resp: 17 16  18   Temp: 97.9 F (36.6 C) 98 F (36.7 C) 98.3 F (36.8 C) 98.3 F (36.8 C)  TempSrc:   Oral Oral  SpO2: 94% 96%  96%  Weight:      Height:        Constitutional: NAD, no pallor or diaphoresis   Eyes: PERTLA, lids and conjunctivae normal ENMT: Mucous membranes are moist. Posterior pharynx clear of any exudate or lesions.   Neck: supple, no masses  Respiratory: Dyspneic with speech. No wheezing.   Cardiovascular: Rate ~100 and irregularly irregular. Pretibial pitting edema.  Abdomen: No tenderness, soft. Bowel sounds active.  Musculoskeletal: no clubbing / cyanosis. No joint deformity upper and lower extremities.   Skin: no significant rashes, lesions, ulcers. Warm, dry, well-perfused. Neurologic: CN 2-12 grossly intact. Sensation to light touch intact. Strength 5/5 in all 4 limbs. Alert and oriented.  Psychiatric: Calm. Cooperative.    Labs and Imaging on Admission: I have personally reviewed following labs and imaging studies  CBC: Recent Labs  Lab 10/02/23 1711  WBC 10.2  NEUTROABS 7.7  HGB 14.9  HCT 48.4*  MCV 88.8  PLT 289   Basic Metabolic Panel: Recent Labs  Lab 10/02/23 1711  NA 140  K 3.4*  CL 107  CO2 25  GLUCOSE 143*  BUN 11  CREATININE 0.70  CALCIUM 9.0   GFR: Estimated Creatinine  Clearance: 109.3 mL/min (by C-G formula based on SCr of 0.7 mg/dL). Liver Function Tests: Recent Labs  Lab 10/02/23 1711  AST 19  ALT 14  ALKPHOS 60  BILITOT 0.9  PROT 8.6*  ALBUMIN 3.6   No results for input(s): LIPASE, AMYLASE in the last 168 hours. No results for input(s): AMMONIA in the last 168 hours. Coagulation Profile: No results for input(s): INR, PROTIME in the last 168 hours. Cardiac Enzymes: No results for input(s): CKTOTAL, CKMB, CKMBINDEX, TROPONINI in the last 168 hours. BNP (last 3 results) No results for input(s): PROBNP in the last 8760 hours. HbA1C: No results for input(s): HGBA1C in the last 72 hours. CBG: No results for  input(s): GLUCAP in the last 168 hours. Lipid Profile: No results for input(s): CHOL, HDL, LDLCALC, TRIG, CHOLHDL, LDLDIRECT in the last 72 hours. Thyroid Function Tests: No results for input(s): TSH, T4TOTAL, FREET4, T3FREE, THYROIDAB in the last 72 hours. Anemia Panel: No results for input(s): VITAMINB12, FOLATE, FERRITIN, TIBC, IRON, RETICCTPCT in the last 72 hours. Urine analysis:    Component Value Date/Time   COLORURINE YELLOW 02/24/2023 0518   APPEARANCEUR CLEAR 02/24/2023 0518   APPEARANCEUR Clear 07/16/2018 1126   LABSPEC 1.030 02/24/2023 0518   PHURINE 5.0 02/24/2023 0518   GLUCOSEU >=500 (A) 02/24/2023 0518   HGBUR NEGATIVE 02/24/2023 0518   BILIRUBINUR NEGATIVE 02/24/2023 0518   BILIRUBINUR Negative 07/16/2018 1126   KETONESUR 5 (A) 02/24/2023 0518   PROTEINUR NEGATIVE 02/24/2023 0518   UROBILINOGEN 0.2 01/04/2009 1218   NITRITE NEGATIVE 02/24/2023 0518   LEUKOCYTESUR NEGATIVE 02/24/2023 0518   Sepsis Labs: @LABRCNTIP (procalcitonin:4,lacticidven:4) )No results found for this or any previous visit (from the past 240 hours).   Radiological Exams on Admission: DG Chest 2 View Result Date: 10/02/2023 CLINICAL DATA:  Shortness of breath EXAM: CHEST - 2 VIEW  COMPARISON:  02/24/2023 FINDINGS: Cardiomegaly, vascular congestion. No overt edema, confluent opacities or effusions. No acute bony abnormality. IMPRESSION: Cardiomegaly, vascular congestion. Electronically Signed   By: Franky Crease M.D.   On: 10/02/2023 18:26    EKG: Independently reviewed. Atrial fibrillation, rate 117.   Assessment/Plan   1. Acute on chronic HFpEF  - Diurese with IV Lasix, monitor weight and I/Os, monitor electrolytes and renal function    2. Atrial fibrillation with RVR  - Continue metoprolol , resume Eliquis  and diltiazem     3. Low back pain  - No red flags, neurologically intact  - Continue pain-control, check radiographs    4. Type II DM  - Check CBGs and use low-intensity SSI for now     5. Hypokalemia  - Replacing    DVT prophylaxis: Eliquis   Code Status: Full  Level of Care: Level of care: Telemetry Cardiac Family Communication: Husband at bedside   Disposition Plan:  Patient is from: home  Anticipated d/c is to: TBD Anticipated d/c date is: 6/27 or 10/05/23  Patient currently: Pending improved volume status, pain-control, rate-control  Consults called: None  Admission status: Observation     Evalene GORMAN Sprinkles, MD [facility]  10/03/2023, 4:11 AM

## 2023-10-03 NOTE — Progress Notes (Signed)
 PT Cancellation Note  Patient Details Name: Tracie [name] MRN: [id number] DOB: 12-11-1960   Cancelled Treatment:    Reason Eval/Treat Not Completed: Medical issues which prohibited therapy (Note imaging revealed L1 and L2 fxs. MD agreed for PT to hold until further work up. Await clarification.)   Tracie [name] 10/03/2023, 8:31 AM Gram Siedlecki M,PT Acute Rehab Services [phone]

## 2023-10-04 ENCOUNTER — Other Ambulatory Visit (HOSPITAL_COMMUNITY): Payer: Self-pay

## 2023-10-04 ENCOUNTER — Telehealth (HOSPITAL_COMMUNITY): Payer: Self-pay | Admitting: Pharmacy Technician

## 2023-10-04 ENCOUNTER — Inpatient Hospital Stay (HOSPITAL_COMMUNITY)

## 2023-10-04 DIAGNOSIS — I4891 Unspecified atrial fibrillation: Secondary | ICD-10-CM | POA: Diagnosis not present

## 2023-10-04 DIAGNOSIS — I5033 Acute on chronic diastolic (congestive) heart failure: Secondary | ICD-10-CM | POA: Diagnosis not present

## 2023-10-04 DIAGNOSIS — M545 Low back pain, unspecified: Secondary | ICD-10-CM | POA: Diagnosis not present

## 2023-10-04 DIAGNOSIS — I5031 Acute diastolic (congestive) heart failure: Secondary | ICD-10-CM

## 2023-10-04 DIAGNOSIS — S32010A Wedge compression fracture of first lumbar vertebra, initial encounter for closed fracture: Secondary | ICD-10-CM

## 2023-10-04 DIAGNOSIS — E876 Hypokalemia: Secondary | ICD-10-CM | POA: Diagnosis not present

## 2023-10-04 LAB — GLUCOSE, CAPILLARY
Glucose-Capillary: 161 mg/dL — ABNORMAL HIGH (ref 70–99)
Glucose-Capillary: 179 mg/dL — ABNORMAL HIGH (ref 70–99)
Glucose-Capillary: 189 mg/dL — ABNORMAL HIGH (ref 70–99)
Glucose-Capillary: 122 mg/dL — ABNORMAL HIGH (ref 70–99)

## 2023-10-04 LAB — BASIC METABOLIC PANEL WITH GFR
Anion gap: 10 (ref 5–15)
BUN: 12 mg/dL (ref 8–23)
CO2: 24 mmol/L (ref 22–32)
Calcium: 9 mg/dL (ref 8.9–10.3)
Chloride: 105 mmol/L (ref 98–111)
Creatinine, Ser: 0.73 mg/dL (ref 0.44–1.00)
GFR, Estimated: >60 mL/min (ref >60)
Glucose, Bld: 138 mg/dL — ABNORMAL HIGH (ref 70–99)
Potassium: 3.5 mmol/L (ref 3.5–5.1)
Sodium: 139 mmol/L (ref 135–145)

## 2023-10-04 LAB — PROTIME-INR
INR: 1.2 (ref 0.8–1.2)
Prothrombin Time: 16.2 s — ABNORMAL HIGH (ref 11.4–15.2)

## 2023-10-04 LAB — CBC
HCT: 44.9 % (ref 36.0–46.0)
Hemoglobin: 13.7 g/dL (ref 12.0–15.0)
MCH: 27.3 pg (ref 26.0–34.0)
MCHC: 30.5 g/dL (ref 30.0–36.0)
MCV: 89.4 fL (ref 80.0–100.0)
Platelets: 270 10*3/uL (ref 150–400)
RBC: 5.02 MIL/uL (ref 3.87–5.11)
RDW: 14.4 % (ref 11.5–15.5)
WBC: 10.7 10*3/uL — ABNORMAL HIGH (ref 4.0–10.5)
nRBC: 0 % (ref 0.0–0.2)

## 2023-10-04 LAB — ECHOCARDIOGRAM COMPLETE
Height: 70.5 in
S' Lateral: 3.3 cm
Weight: 5287.51 [oz_av]

## 2023-10-04 MED ORDER — APIXABAN 5 MG PO TABS
5.0000 mg | ORAL_TABLET | Freq: Two times a day (BID) | ORAL | Status: DC
  Administered 2023-10-04 – 2023-10-17 (×27): 5 mg via ORAL
  Filled 2023-10-04 (×27): qty 1

## 2023-10-04 MED ORDER — METOPROLOL SUCCINATE ER 50 MG PO TB24: 50.0000 mg | ORAL_TABLET | Freq: Four times a day (QID) | ORAL | Status: DC

## 2023-10-04 MED ORDER — WARFARIN SODIUM 7.5 MG PO TABS: 7.5000 mg | ORAL_TABLET | Freq: Once | ORAL | Status: DC

## 2023-10-04 MED ORDER — ASPIRIN 81 MG PO TBEC
81.0000 mg | DELAYED_RELEASE_TABLET | Freq: Every day | ORAL | Status: DC
  Administered 2023-10-04: 81 mg via ORAL
  Filled 2023-10-04: qty 1

## 2023-10-04 MED ORDER — METOPROLOL TARTRATE 50 MG PO TABS
50.0000 mg | ORAL_TABLET | Freq: Four times a day (QID) | ORAL | Status: DC
  Administered 2023-10-04 – 2023-10-07 (×14): 50 mg via ORAL
  Filled 2023-10-04 (×14): qty 1

## 2023-10-04 MED ORDER — CELECOXIB 200 MG PO CAPS
200.0000 mg | ORAL_CAPSULE | Freq: Every day | ORAL | Status: DC
  Administered 2023-10-04 – 2023-10-09 (×6): 200 mg via ORAL
  Filled 2023-10-04 (×6): qty 1

## 2023-10-04 MED ORDER — LOSARTAN POTASSIUM 50 MG PO TABS
100.0000 mg | ORAL_TABLET | Freq: Every day | ORAL | Status: DC
  Administered 2023-10-04 – 2023-10-09 (×6): 100 mg via ORAL
  Filled 2023-10-04 (×6): qty 2

## 2023-10-04 NOTE — Evaluation (Addendum)
 Physical Therapy Evaluation Patient Details Name: Tracie [name] MRN: [id number] DOB: Aug 13, 1960 Today's Date: 10/04/2023  History of Present Illness  Pt is a 63 y.o female admitted 6/25 for bil leg swelling and back pain. Chest x-ray showed cardiomegaly and vascular congestion. MRI showed L1-L2 fxs & mod spinal stenosis. Neurosurgery recommended conservative management. PMH: Kyphoplasty Nov 2024, DVTs, A-fib, DM  Clinical Impression  Pt admitted with/for bil LE swelling and back pain.  Imaging showed more fx's and l1-L2.  Pt needing heavy min to CGA for most basic mobility .  Pt currently limited functionally due to the problems listed. ( See problems list.)   Pt will benefit from PT to maximize function and safety in order to get ready for next venue listed below.  Patient will benefit from continued inpatient follow up therapy, <3 hours/day, though likely will choose to d/c home.          If plan is discharge home, recommend the following: A little help with walking and/or transfers;A little help with bathing/dressing/bathroom;Assistance with cooking/housework;Assist for transportation;Help with stairs or ramp for entrance;Direct supervision/assist for financial management   Can travel by private vehicle   No    Equipment Recommendations    Recommendations for Other Services       Functional Status Assessment Patient has had a recent decline in their functional status and demonstrates the ability to make significant improvements in function in a reasonable and predictable amount of time.     Precautions / Restrictions Precautions Precautions: Back (For comfort) Precaution Booklet Issued: No Recall of Precautions/Restrictions: Impaired Restrictions Weight Bearing Restrictions Per Provider Order: No      Mobility  Bed Mobility Overal bed mobility: Needs Assistance Bed Mobility: Sidelying to Sit, Sit to Sidelying, Supine to Sit   Sidelying to sit: Min assist Supine to  sit: Supervision, HOB elevated, Used rails (supervision popping up due to spasming, though more stressful on Lumbar area than sidelying to from sitting.)   Sit to sidelying: Min assist General bed mobility comments: Increased time    Transfers Overall transfer level: Needs assistance Equipment used: Rolling walker (2 wheels) Transfers: Sit to/from Stand Sit to Stand: Contact guard assist   Step pivot transfers: Contact guard assist       General transfer comment: CGA using rocking momentum, standing from elevated bed or BSC    Ambulation/Gait Ambulation/Gait assistance: Min assist Gait Distance (Feet): 90 Feet Assistive device: Rolling walker (2 wheels) Gait Pattern/deviations: Step-through pattern, Decreased step length - right, Decreased step length - left, Decreased stride length, Wide base of support, Decreased weight shift to right, Decreased weight shift to left   Gait velocity interpretation: <1.31 ft/sec, indicative of household ambulator   General Gait Details: Due to proximal weaknesses in LE's an pelvis, pt has excessive lateral w/shit or sway and drags the RW back and forth in an unsteady manner with min assist needed for some control.  Mild dyspnea, but big spike in HR sustained in the 140's and 150's and spikes to 170's.  SpO2 overall in the 93% range.  Stairs            Wheelchair Mobility     Tilt Bed    Modified Rankin (Stroke Patients Only)       Balance Overall balance assessment: Needs assistance Sitting-balance support: Bilateral upper extremity supported, Feet supported, No upper extremity supported Sitting balance-Leahy Scale: Fair     Standing balance support: No upper extremity supported, Reliant on assistive device for balance,  During functional activity Standing balance-Leahy Scale: Poor (to fair) Standing balance comment: Pt can statically stand without AD, benefits from RW                             Pertinent  Vitals/Pain Pain Assessment Pain Assessment: Faces Faces Pain Scale: Hurts even more Pain Location: Back Pain Descriptors / Indicators: Discomfort, Grimacing Pain Intervention(s): Monitored during session    Home Living Family/patient expects to be discharged to:: Private residence Living Arrangements: Spouse/significant other Available Help at Discharge: Family;Available 24 hours/day Type of Home: Apartment Home Access: Level entry       Home Layout: One level Home Equipment: Agricultural consultant (2 wheels);Rollator (4 wheels);Cane - quad;Toilet riser;Shower seat;Adaptive equipment      Prior Function Prior Level of Function : Needs assist             Mobility Comments: Using RW, sleeps in chair at baseline ADLs Comments: Sponge bathes, seated grooming at baseline assist with LB dressing since Kyphoplasty in Nov     Extremity/Trunk Assessment   Upper Extremity Assessment Upper Extremity Assessment: Overall WFL for tasks assessed    Lower Extremity Assessment Lower Extremity Assessment: RLE deficits/detail;LLE deficits/detail (functional bil, but mildly weak proximally.  R LE/quad progressive spasm with gait.) RLE Coordination: decreased fine motor    Cervical / Trunk Assessment Cervical / Trunk Assessment: Back Surgery (Kyphoplasty Nov. 2024)  Communication   Communication Communication: No apparent difficulties    Cognition Arousal: Alert Behavior During Therapy: WFL for tasks assessed/performed                             Following commands: Intact       Cueing Cueing Techniques: Verbal cues     General Comments      Exercises     Assessment/Plan    PT Assessment Patient needs continued PT services  PT Problem List Decreased activity tolerance;Decreased balance;Decreased mobility;Decreased knowledge of use of DME;Cardiopulmonary status limiting activity;Pain       PT Treatment Interventions DME instruction;Gait training;Functional  mobility training;Therapeutic activities;Balance training;Patient/family education    PT Goals (Current goals can be found in the Care Plan section)  Acute Rehab PT Goals Patient Stated Goal: able to do more and be independent PT Goal Formulation: With patient Time For Goal Achievement: 10/18/23 Potential to Achieve Goals: Fair    Frequency Min 3X/week     Co-evaluation               AM-PAC PT 6 Clicks Mobility  Outcome Measure Help needed turning from your back to your side while in a flat bed without using bedrails?: A Little Help needed moving from lying on your back to sitting on the side of a flat bed without using bedrails?: A Lot Help needed moving to and from a bed to a chair (including a wheelchair)?: A Little Help needed standing up from a chair using your arms (e.g., wheelchair or bedside chair)?: A Little Help needed to walk in hospital room?: A Little Help needed climbing 3-5 steps with a railing? : A Lot 6 Click Score: 16    End of Session   Activity Tolerance: Patient tolerated treatment well;Other (comment) (Not symptomatic with tachycardia except dyspnea 2/4 and sats 93%) Patient left: in bed;with call bell/phone within reach;with family/visitor present Nurse Communication: Mobility status PT Visit Diagnosis: Unsteadiness on feet (R26.81);Other abnormalities of gait and  mobility (R26.89);Difficulty in walking, not elsewhere classified (R26.2);Pain Pain - part of body:  (back and R leg with standing activity and gait)    Time: 8573-8542 PT Time Calculation (min) (ACUTE ONLY): 31 min   Charges:   PT Evaluation $PT Eval Moderate Complexity: 1 Mod PT Treatments $Gait Training: 8-22 mins PT General Charges $$ ACUTE PT VISIT: 1 Visit         10/04/2023  [place] HERO., PT Acute Rehabilitation Services [phone]  (office)  Vinie GAILS Cozetta Seif 10/04/2023, 3:18 PM

## 2023-10-04 NOTE — Progress Notes (Signed)
 Progress Note   Patient: Tracie [name] FMW:[id number] DOB: 1960/12/23 DOA: 10/02/2023     1 DOS: the patient was seen and examined on 10/04/2023   Brief hospital course: Tracie [name] is a 63 y.o. female with medical history significant for hypertension, type 2 diabetes mellitus, history of DVT, PAF on Eliquis , chronic HFpEF, and chronic low back pain who presents with worsening bilateral lower extremity swelling, shortness of breath, and back pain.  Patient is admitted to the hospitalist service for further management evaluation of heart failure exacerbation, A-fib RVR, severe back pain.  Patient found to have L1-L2 fracture on x-ray, neurosurgery consulted.  Cardiology consulted for CHF exacerbation, A-fib with RVR.  Patient is started on IV Lasix , beta-blocker therapy and Eliquis .  She seems to be noncompliant with her medications.  Assessment and Plan: A-fib with RVR: HR elevated even with mild exertion, getting out of bed. Cardiology follow up appreciated. Patient's Toprol  XL changed to metoprolol  tartrate 50 mg 4 times daily.  She does not want to take Toprol -XL dose. I discussed with her regarding anticoagulation, risk of strokes.  Patient agreed to take Eliquis  therapy, her co-pay $22 per pharmacy.  Acute on chronic diastolic heart failure: She states her shortness of breath, leg swelling better. Patient will be continued on IV Lasix  40 mg twice daily per cardiology. Continue beta-blocker, losartan  100 mg, further GDMT as per cardiology follow-up. Monitor daily weights, strict input and output. Fluid/salt restriction advised.  CHF education provided.  Hypokalemia: Due to IV diuresis.  Potassium improved with IV supplements yesterday. Continue oral potassium supplementation daily.  L1 compression fracture- H/o kyphoplasty 02/2023 MRI lumbar spine reviewed shows interval development of worsening central spinal canal stenosis at L1 secondary to compression fracture,  interval development of superior endplate compression deformity of L2. Seen by neurosurgery-advised conservative management due to her morbid obesity, anatomy, cardiac risk factors, she is not a surgical candidate. Continue pain control with IV Dilaudid , Toradol , Robaxin .  Patient wishes to take her home dose Celebrex .  PT OT evaluation, activity as tolerated.  Morbid obesity with BMI 46.75: Complicating further her current condition. She understands to lose weight. Diet, exercise and weight reduction advised.     Out of bed to chair. Incentive spirometry. Nursing supportive care. Fall, aspiration precautions. Diet:  Diet Orders (From admission, onward)     Start     Ordered   10/03/23 0409  Diet Heart Room service appropriate? Yes; Fluid consistency: Thin  Diet effective now       Question Answer Comment  Room service appropriate? Yes   Fluid consistency: Thin      10/03/23 0411           DVT prophylaxis:  apixaban  (ELIQUIS ) tablet 5 mg  Level of care: Telemetry Cardiac   Code Status: Full Code  Subjective: Patient is seen and examined today morning. She feels better today, got out of bed with PT. Eating fair. Agreed to take Eliquis , cardiac meds.  Physical Exam: Vitals:   10/04/23 0356 10/04/23 0407 10/04/23 0835 10/04/23 1108  BP:   (!) 149/93 126/71  Pulse: (!) 109  92 (!) 108  Resp: 20  (!) 22 20  Temp: 98.3 F (36.8 C)  98.2 F (36.8 C) 98.3 F (36.8 C)  TempSrc: Oral  Oral Oral  SpO2: 93%  93% 95%  Weight:  (!) 149.9 kg    Height:        General - Elderly morbidly obese Caucasian female, sitting, no apparent  distress HEENT - PERRLA, EOMI, atraumatic head, non tender sinuses. Lung - distant breath sounds, basal rales, rhonchi, no wheezes. Heart - S1, S2 heard, no murmurs, rubs, 2+ pedal edema. Abdomen - Soft, non tender, obese, bowel sounds good Neuro - Alert, awake and oriented x 3, non focal exam. Skin - Warm and dry.  Data Reviewed:       Latest Ref Rng & Units 10/04/2023    2:46 AM 10/03/2023    5:23 AM 10/02/2023    5:11 PM  CBC  WBC 4.0 - 10.5 K/uL 10.7  11.5  10.2   Hemoglobin 12.0 - 15.0 g/dL 86.2  85.7  85.0   Hematocrit 36.0 - 46.0 % 44.9  46.9  48.4   Platelets 150 - 400 K/uL 270  254  289       Latest Ref Rng & Units 10/04/2023    2:46 AM 10/03/2023    5:23 AM 10/02/2023    5:11 PM  BMP  Glucose 70 - 99 mg/dL 861  839  856   BUN 8 - 23 mg/dL 12  10  11    Creatinine 0.44 - 1.00 mg/dL 9.26  9.40  9.29   Sodium 135 - 145 mmol/L 139  139  140   Potassium 3.5 - 5.1 mmol/L 3.5  2.9  3.4   Chloride 98 - 111 mmol/L 105  107  107   CO2 22 - 32 mmol/L 24  21  25    Calcium 8.9 - 10.3 mg/dL 9.0  8.6  9.0    ECHOCARDIOGRAM COMPLETE Result Date: 10/04/2023    ECHOCARDIOGRAM REPORT   Patient Name:   Tracie [name] Date of Exam: 10/04/2023 Medical Rec #:  [id number]           Height:       70.5 in Accession #:    [id number]          Weight:       330.5 lb Date of Birth:  [birth year]           BSA:          2.598 m Patient Age:    62 years            BP:           135/81 mmHg Patient Gender: F                   HR:           100 bpm. Exam Location:  Inpatient Procedure: 2D Echo, Cardiac Doppler and Color Doppler (Both Spectral and Color            Flow Doppler were utilized during procedure). Indications:    CHF I50.31 Afib I48.91  History:        Patient has prior history of Echocardiogram examinations, most                 recent 02/25/2023.  Sonographer:    Tinnie Gosling RDCS Referring Phys: [id number] LOGAN N LOCKWOOD IMPRESSIONS  1. Left ventricular ejection fraction, by estimation, is 60 to 65%. The left ventricle has normal function. Left ventricular endocardial border not optimally defined to evaluate regional wall motion. There is mild concentric left ventricular hypertrophy. Left ventricular diastolic function could not be evaluated.  2. Right ventricular systolic function is normal. The right ventricular size is normal.  3. Left  atrial size was mild to moderately dilated.  4. Right atrial size was mild to moderately dilated.  5. The mitral valve is normal in structure. No evidence of mitral valve regurgitation. No evidence of mitral stenosis.  6. The aortic valve was not well visualized. There is mild calcification of the aortic valve. Aortic valve regurgitation is not visualized. Aortic valve sclerosis/calcification is present, without any evidence of aortic stenosis.  7. The inferior vena cava is normal in size with greater than 50% respiratory variability, suggesting right atrial pressure of 3 mmHg. Conclusion(s)/Recommendation(s): Very poor image quality due to poor sound wave transmission. LV endocardium never seen well. FINDINGS  Left Ventricle: Left ventricular ejection fraction, by estimation, is 60 to 65%. The left ventricle has normal function. Left ventricular endocardial border not optimally defined to evaluate regional wall motion. The left ventricular internal cavity size was normal in size. There is mild concentric left ventricular hypertrophy. Left ventricular diastolic function could not be evaluated due to atrial fibrillation. Left ventricular diastolic function could not be evaluated. Right Ventricle: The right ventricular size is normal. No increase in right ventricular wall thickness. Right ventricular systolic function is normal. Left Atrium: Left atrial size was mild to moderately dilated. Right Atrium: Right atrial size was mild to moderately dilated. Pericardium: There is no evidence of pericardial effusion. Mitral Valve: The mitral valve is normal in structure. No evidence of mitral valve regurgitation. No evidence of mitral valve stenosis. Tricuspid Valve: The tricuspid valve is not well visualized. Tricuspid valve regurgitation is trivial. No evidence of tricuspid stenosis. Aortic Valve: The aortic valve was not well visualized. There is mild calcification of the aortic valve. Aortic valve regurgitation is not  visualized. Aortic valve sclerosis/calcification is present, without any evidence of aortic stenosis. Pulmonic Valve: The pulmonic valve was not well visualized. Pulmonic valve regurgitation is trivial. No evidence of pulmonic stenosis. Aorta: The aortic root is normal in size and structure. Venous: The inferior vena cava is normal in size with greater than 50% respiratory variability, suggesting right atrial pressure of 3 mmHg. IAS/Shunts: No atrial level shunt detected by color flow Doppler.  LEFT VENTRICLE PLAX 2D LVIDd:         4.60 cm LVIDs:         3.30 cm LV PW:         1.30 cm LV IVS:        1.20 cm LVOT diam:     2.30 cm LV SV:         51 LV SV Index:   20 LVOT Area:     4.15 cm  IVC IVC diam: 2.30 cm LEFT ATRIUM           Index LA diam:      4.50 cm 1.73 cm/m LA Vol (A4C): 84.4 ml 32.49 ml/m  AORTIC VALVE LVOT Vmax:   67.10 cm/s LVOT Vmean:  44.600 cm/s LVOT VTI:    0.122 m  AORTA Ao Root diam: 3.20 cm Ao Asc diam:  3.70 cm  SHUNTS Systemic VTI:  0.12 m Systemic Diam: 2.30 cm Toribio Fuel MD Electronically signed by Toribio Fuel MD Signature Date/Time: 10/04/2023/8:59:01 AM    Final    VAS US  LOWER EXTREMITY VENOUS (DVT) Result Date: 10/03/2023  Lower Venous DVT Study Patient Name:  Tracie [name]  Date of Exam:   10/03/2023 Medical Rec #: [id number]            Accession #:    [id number] Date of Birth: Jan 24, 1961            Patient Gender: F Patient Age:  62 years Exam Location[facility]  [facility] Procedure:      VAS US  LOWER EXTREMITY VENOUS (DVT) Referring Phys: TIMOTHY OPYD --------------------------------------------------------------------------------  Indications: Swelling, and Edema.  Risk Factors: DVT Hx of DVT. Limitations: Body habitus and Pt is unable to tolerate compression in groin and thigh area therefore the contralateral groin is not assessed. Pt screams loudly, tenses and holds breath. Performing Technologist: Elmarie Lindau, RVT  Examination Guidelines: A complete  evaluation includes B-mode imaging, spectral Doppler, color Doppler, and power Doppler as needed of all accessible portions of each vessel. Bilateral testing is considered an integral part of a complete examination. Limited examinations for reoccurring indications may be performed as noted. The reflux portion of the exam is performed with the patient in reverse Trendelenburg.  +---------+---------------+---------+-----------+----------+---------------+ RIGHT    CompressibilityPhasicitySpontaneityPropertiesThrombus Aging  +---------+---------------+---------+-----------+----------+---------------+ CFV      Full           Yes      Yes                                  +---------+---------------+---------+-----------+----------+---------------+ SFJ      Full                                                         +---------+---------------+---------+-----------+----------+---------------+ FV Prox  Full                                                         +---------+---------------+---------+-----------+----------+---------------+ FV Mid                  Yes                           full color flow +---------+---------------+---------+-----------+----------+---------------+ FV Distal               Yes                           full color flow +---------+---------------+---------+-----------+----------+---------------+ PFV      Full                                                         +---------+---------------+---------+-----------+----------+---------------+ POP      Full           Yes      Yes                                  +---------+---------------+---------+-----------+----------+---------------+ PTV      Full                                                         +---------+---------------+---------+-----------+----------+---------------+  PERO     Full                                                          +---------+---------------+---------+-----------+----------+---------------+        Summary: RIGHT: - There is no evidence of deep vein thrombosis in the lower extremity.  - No cystic structure found in the popliteal fossa.   *See table(s) above for measurements and observations. Electronically signed by Debby Robertson on 10/03/2023 at 9:39:31 PM.    Final    MR LUMBAR SPINE WO CONTRAST Result Date: 10/03/2023 CLINICAL DATA:  Low back pain, prior surgery, new symptoms EXAM: MRI LUMBAR SPINE WITHOUT CONTRAST TECHNIQUE: Multiplanar, multisequence MR imaging of the lumbar spine was performed. No intravenous contrast was administered. COMPARISON:  Plain radiographs the lumbar spine dated October 03, 2023 and MRI of the lumbar spine dated February 26, 2023. FINDINGS: Segmentation:  Standard Alignment: Focal kyphosis at L1 secondary to marked vertebral compression deformity status post recent kyphoplasty. Vertebrae: Since the previous MRI, the patient has undergone bilateral kyphoplasty at L1. There has also been significant interval worsening of the compression fracture and worsening retropulsion of the posterior wall of the vertebral body, which now protrudes approximately 9 mm posteriorly, resulting in moderate central spinal canal stenosis, which is worse on the left. There has also been interval development of a superior endplate compression deformity of L2, which appears to be chronic as there is no increased signal present on the STIR sequence. There are hemangiomas present within the L3 and L4 vertebrae. Conus medullaris and cauda equina: Conus extends to the midbody of L1 level. Conus and cauda equina appear normal. Paraspinal and other soft tissues: There is a simple appearing cyst again seen arising posteriorly from the left kidney. No follow-up is necessary. The paraspinous soft tissues are otherwise unremarkable. Disc levels: T12-L1: There is a new small right paracentral protrusion seen best on the sagittal  sequences, image 8. There is mild right-sided central spinal canal stenosis. The neural foramina are widely patent. L1-2: There is moderate central spinal canal stenosis secondary to retropulsion of the posteroinferior corner of the vertebral body. There is no impingement upon the conus medullaris or cauda equina. The neural foramina are patent. L2-3: Mild diffuse disc bulging and mild bilateral facet arthrosis with mild central spinal canal stenosis. The neural foramina are widely patent. L3-4: Normal. L4-5: Mild diffuse disc bulging, endplate ridging and facet hypertrophy, with mild central spinal canal stenosis and mild bilateral lateral recess stenosis. No apparent nerve root impingement. L5-S1: Normal. IMPRESSION: 1. Interval development of worsening central spinal canal stenosis at L1 secondary to interval worsening of a compression fracture at L1 and interval development of a superior endplate compression deformity of L2, which has lost approximately 20% of its height anteriorly. There is moderate focal kyphosis as a result. Electronically Signed   By: Evalene Coho M.D.   On: 10/03/2023 12:51   DG Lumbar Spine 2-3 Views Result Date: 10/03/2023 EXAM: 2 or 3 VIEW(S) XRAY OF THE LUMBAR SPINE 10/03/2023 04:26:00 AM COMPARISON: Lumbar spine radiographs. CLINICAL HISTORY: Pain, best images obtained due to patient condition and body habitus. FINDINGS: LUMBAR SPINE: BONES: Spinal augmentation is present at L1. The L1 fracture demonstrates progressive loss of height since the prior exam. Methyl methacrylate is present outside of  the vertebral body. A superior endplate fracture at L2 is new since the prior exam. Exaggerated kyphosis is present. DISCS AND DEGENERATIVE CHANGES: No severe degenerative changes. The discs are maintained. SOFT TISSUES: No acute abnormality. VASCULATURE: Atherosclerotic calcifications are present in the aorta. IMPRESSION: 1. Progressive loss of height of the L1 fracture with methyl  methacrylate present outside of the vertebral body. 2. New superior endplate fracture at L2. 3. Exaggerated kyphosis. Electronically signed by: Lonni Necessary MD 10/03/2023 05:06 AM EDT RP Workstation: [id number]   DG Chest 2 View Result Date: 10/02/2023 CLINICAL DATA:  Shortness of breath EXAM: CHEST - 2 VIEW COMPARISON:  02/24/2023 FINDINGS: Cardiomegaly, vascular congestion. No overt edema, confluent opacities or effusions. No acute bony abnormality. IMPRESSION: Cardiomegaly, vascular congestion. Electronically Signed   By: Franky Crease M.D.   On: 10/02/2023 18:26    Family Communication: Discussed with patient, husband at bedside. They understand and agree. All questions answered.  Disposition: Status is: Inpatient Remains inpatient appropriate because: IV diuresis, pain control, PT/ OT  Planned Discharge Destination: Home with Home Health     Time spent: 41 minutes  Author: Concepcion Riser, MD 10/04/2023 12:07 PM Secure chat 7am to 7pm For on call review [email or web address].

## 2023-10-04 NOTE — Progress Notes (Signed)
  Echocardiogram 2D Echocardiogram has been performed.  Tinnie FORBES Gosling RDCS 10/04/2023, 8:22 AM

## 2023-10-04 NOTE — Progress Notes (Addendum)
 PHARMACY - ANTICOAGULATION CONSULT NOTE  Pharmacy Consult for Warfarin Indication: atrial fibrillation  Allergies  Allergen Reactions   Codeine Other (See Comments)    Unknown reaction   Crestor [Rosuvastatin] Other (See Comments)    Myalgias  Weakness    Patient Measurements: Height: 5' 10.5 (179.1 cm) Weight: (!) 149.9 kg (330 lb 7.5 oz) IBW/kg (Calculated) : 69.65 HEPARIN  DW (KG): 106.1  Vital Signs: Temp: 98.2 F (36.8 C) (06/27 0835) Temp Source: Oral (06/27 0835) BP: 149/93 (06/27 0835) Pulse Rate: 92 (06/27 0835)  Labs: Recent Labs    10/02/23 1711 10/03/23 0523 10/04/23 0246  HGB 14.9 14.2 13.7  HCT 48.4* 46.9* 44.9  PLT 289 254 270  LABPROT  --   --  16.2*  INR  --   --  1.2  CREATININE 0.70 0.59 0.73    Estimated Creatinine Clearance: 114.2 mL/min (by C-G formula based on SCr of 0.73 mg/dL).   Medical History: Past Medical History:  Diagnosis Date   Atrial fibrillation (HCC)    Celiac artery dissection (HCC)    Diabetes mellitus (HCC)    History of DVT (deep vein thrombosis) 2010   Hyperlipidemia    Hypertension    Morbid obesity (HCC)     Medications:  Scheduled:   aspirin  EC  81 mg Oral Daily   celecoxib   200 mg Oral Daily   enoxaparin  (LOVENOX ) injection  1 mg/kg Subcutaneous Q12H   furosemide   40 mg Intravenous BID   insulin  aspart  0-5 Units Subcutaneous QHS   insulin  aspart  0-6 Units Subcutaneous TID WC   losartan   100 mg Oral Daily   metoprolol  succinate  50 mg Oral BID   potassium chloride   40 mEq Oral BID   sodium chloride  flush  3 mL Intravenous Q12H   Warfarin - Pharmacist Dosing Inpatient   Does not apply q1600   Infusions:  PRN: acetaminophen  **OR** acetaminophen , HYDROmorphone  (DILAUDID ) injection, ketorolac , methocarbamol , ondansetron  **OR** ondansetron  (ZOFRAN ) IV, oxyCODONE , polyethylene glycol  Assessment: 63 yo female with afib prescribed Eliquis  but not taking due to concern over bleeding and cost issues.  Pharmacy consulted to dose warfarin.   INR today is 1.2 as expected, CBC ok. Enoxaparin  bridge ongoing. Discussed with pt re: warfarin and Eliquis  pros and cons. Her copay for Eliquis  is actually only $22 (not >$100). Pt would like to think further before deciding which oral option to go with. Will continue warfarin dosing for now.  Goal of Therapy:  INR 2-3 Monitor platelets by anticoagulation protocol: Yes   Plan:  Warfarin 7.5mg  PO x 1 tonight Daily PT/INR Enoxaparin  1mg /kg q12h  ADDENDUM transition to apixaban  5mg  BID per pt request.  Ozell Jamaica, PharmD, BCPS, [facility] Clinical Pharmacist [phone] Please check AMION for all [facility] Pharmacy numbers 10/04/2023

## 2023-10-04 NOTE — Progress Notes (Signed)
  Progress Note  Patient Name: Tracie [name] Date of Encounter: 10/04/2023 [facility] Cardiologist: Ozell Fell, MD   Interval Summary   6 to 7 months of progressive bilateral lower extremity swelling.  Worsening dyspnea.  Been sleeping sitting up for about 7 months because of low back pain, not orthopnea.  Quit smoking 15 years ago  Vital Signs Vitals:   10/04/23 0355 10/04/23 0356 10/04/23 0407 10/04/23 0835  BP:    (!) 149/93  Pulse: (!) 114 (!) 109  92  Resp:  20  (!) 22  Temp:  98.3 F (36.8 C)  98.2 F (36.8 C)  TempSrc:  Oral  Oral  SpO2: 90% 93%  93%  Weight:   (!) 149.9 kg   Height:        Intake/Output Summary (Last 24 hours) at 10/04/2023 0958 Last data filed at 10/04/2023 0847 Gross per 24 hour  Intake 111.64 ml  Output 1630 ml  Net -1518.36 ml      10/04/2023    4:07 AM 10/03/2023    5:13 PM 10/02/2023    5:06 PM  Last 3 Weights  Weight (lbs) 330 lb 7.5 oz 331 lb 12.7 oz 310 lb 10.1 oz  Weight (kg) 149.9 kg 150.5 kg 140.9 kg      Telemetry/ECG  AFIB 100-120 - Personally Reviewed  Physical Exam  GEN: No acute distress.   Neck: No JVD Cardiac: Irreg irreg, tachycardic no murmurs, rubs, or gallops.  Respiratory: Clear to auscultation bilaterally. GI: Soft, nontender, non-distended  MS: 2-3+ lower extremity edema  Assessment & Plan   63 year old here with atrial fibrillation rapid ventricular response, hypokalemia, suspected diastolic heart failure exacerbation hypertension type 2 diabetes.  Came in with chief complaint of leg swelling shortness of breath and low back pain.  Atrial fibrillation with RVR - Metoprolol  50 mg twice a day given.  We will increase to 50 mg 4 times a day tartrate -She will not take metoprolol  succinate 100 mg a day because this higher dose causes arm pain.  Chronic anticoagulation -Refused to take Eliquis  yesterday.  Now she refuses to take Coumadin .  She wants to take Eliquis .  I will consult per pharmacy  consult.  Yesterday said cost was an issue with Eliquis . -Eliquis  per pharmacy.  Understands stroke risk.  Acute on chronic diastolic heart failure - Continue with IV Lasix  diuresis.  40 mg IV twice daily. -Losartan  100 mg-optimally Entresto would be helpful for her.  Hypokalemia - Potassium supplementation 40 twice daily.  Back pain - Dr. Colon note reviewed.  Medical management.    For questions or updates, please contact [facility] Please consult [email or web address] for contact info under       Signed, Oneil Parchment, MD

## 2023-10-04 NOTE — Plan of Care (Signed)
  Problem: Nutritional: Goal: Maintenance of adequate nutrition will improve Outcome: Progressing   Problem: Tissue Perfusion: Goal: Adequacy of tissue perfusion will improve Outcome: Progressing   Problem: Clinical Measurements: Goal: Will remain free from infection Outcome: Progressing Goal: Respiratory complications will improve Outcome: Progressing Goal: Cardiovascular complication will be avoided Outcome: Progressing   Problem: Coping: Goal: Level of anxiety will decrease Outcome: Progressing   Problem: Elimination: Goal: Will not experience complications related to urinary retention Outcome: Progressing   Problem: Safety: Goal: Ability to remain free from injury will improve Outcome: Progressing

## 2023-10-04 NOTE — Progress Notes (Signed)
 Heart Failure Navigator Progress Note  Assessed for Heart & Vascular TOC clinic readiness.  Patient does not meet criteria due to EF 60-65%, No HF TOC . SABRA   Navigator will sign off at this time.   Stephane Haddock, BSN, Scientist, clinical (histocompatibility and immunogenetics) Only

## 2023-10-04 NOTE — Evaluation (Signed)
 Occupational Therapy Evaluation Patient Details Name: Tracie [name] MRN: [id number] DOB: [birth year] Today's Date: 10/04/2023   History of Present Illness   Pt is a 63 y.o female admitted 6/25 for bil leg swelling and back pain. Chest x-ray showed cardiomegaly and vascular congestion. MRI showed L1-L2 fxs & mod spinal stenosis. Neurosurgery recommended conservative management. PMH: Kyphoplasty Nov 2024, DVTs, A-fib, DM     Clinical Impressions Pt admitted based on above, and was seen based on problem list below. PTA pt was receiving min to mod assistance with ADLs since Kyphoplasty in Nov. 2024. Today pt is requiring set up  to mod assist for ADLs. Bed mobility and functional transfers are  CGA. Noted pt HR increased with mobility up to 192 bpm. Pt with goals of returning to independence level once d/c. OT educated pt on benefits of post-acute rehab in order to achieve these goals Pt would greatly benefit from <3 hours of skilled rehab daily. If pt declines then pt should d/c home with [facility] services. OT will continue to follow acutely to maximize functional independence.        If plan is discharge home, recommend the following:   A little help with walking and/or transfers;A little help with bathing/dressing/bathroom     Functional Status Assessment   Patient has had a recent decline in their functional status and demonstrates the ability to make significant improvements in function in a reasonable and predictable amount of time.     Equipment Recommendations   BSC/3in1;Wheelchair (measurements OT);Wheelchair cushion (measurements OT)     Recommendations for Other Services         Precautions/Restrictions   Precautions Precautions: Back (For comfort) Precaution Booklet Issued: No Recall of Precautions/Restrictions: Impaired Restrictions Weight Bearing Restrictions Per Provider Order: No     Mobility Bed Mobility Overal bed mobility: Needs Assistance Bed  Mobility: Supine to Sit     Supine to sit: Supervision, HOB elevated, Used rails     General bed mobility comments: Increased time    Transfers Overall transfer level: Needs assistance Equipment used: Rolling walker (2 wheels) Transfers: Sit to/from Stand, Bed to chair/wheelchair/BSC Sit to Stand: Contact guard assist     Step pivot transfers: Contact guard assist     General transfer comment: CGA using rocking momentum, standing from elevated bed or BSC      Balance Overall balance assessment: Needs assistance Sitting-balance support: Bilateral upper extremity supported, Feet supported Sitting balance-Leahy Scale: Fair     Standing balance support: No upper extremity supported, Reliant on assistive device for balance, During functional activity Standing balance-Leahy Scale: Poor Standing balance comment: Pt can statically stand without AD, benefits from RW       ADL either performed or assessed with clinical judgement   ADL Overall ADL's : Needs assistance/impaired Eating/Feeding: Set up;Sitting   Grooming: Wash/dry face;Oral care;Set up;Sitting   Upper Body Bathing: Set up;Sitting   Lower Body Bathing: Minimal assistance;Sit to/from stand Lower Body Bathing Details (indicate cue type and reason): Assist to reach BLEs Upper Body Dressing : Set up;Sitting   Lower Body Dressing: Moderate assistance;Sit to/from stand Lower Body Dressing Details (indicate cue type and reason): Assist to thread legs, per pt assist needed to pull above waist Toilet Transfer: Ambulation;Contact guard assist;Rolling walker (2 wheels);BSC/3in1 Toilet Transfer Details (indicate cue type and reason): short distance bariatric BSC Toileting- Clothing Manipulation and Hygiene: Contact guard assist;Sit to/from stand Toileting - Clothing Manipulation Details (indicate cue type and reason): CGA for balance  Functional mobility during ADLs: Contact guard assist;Rolling walker (2  wheels) General ADL Comments: Pt using compensatory strategies and assist at home for ADLs d/t deconditioning     Vision Baseline Vision/History: 0 No visual deficits Vision Assessment?: No apparent visual deficits            Pertinent Vitals/Pain Pain Assessment Pain Assessment: Faces Faces Pain Scale: Hurts even more Pain Location: Back Pain Descriptors / Indicators: Discomfort, Grimacing Pain Intervention(s): Monitored during session, RN gave pain meds during session     Extremity/Trunk Assessment Upper Extremity Assessment Upper Extremity Assessment: Overall WFL for tasks assessed   Lower Extremity Assessment Lower Extremity Assessment: Defer to PT evaluation   Cervical / Trunk Assessment Cervical / Trunk Assessment: Back Surgery (Kyphoplasty Nov. 2024)   Communication Communication Communication: No apparent difficulties   Cognition Arousal: Alert Behavior During Therapy: WFL for tasks assessed/performed Cognition: Cognition impaired     Awareness: Intellectual awareness intact, Online awareness impaired Memory impairment (select all impairments): Short-term memory Attention impairment (select first level of impairment): Sustained attention Executive functioning impairment (select all impairments): Sequencing, Problem solving OT - Cognition Comments: Pt with poor safety awareness and judgement, decreased attention     Following commands: Intact       Cueing  General Comments   Cueing Techniques: Verbal cues  HR elevating with mobility at times up to 192, RN present and notified           Home Living Family/patient expects to be discharged to:: Private residence Living Arrangements: Spouse/significant other Available Help at Discharge: Family;Available 24 hours/day Type of Home: Apartment Home Access: Level entry     Home Layout: One level     Bathroom Shower/Tub: Tub/shower unit;Curtain;Sponge bathes at baseline   Bathroom Toilet: Standard  (Toilet riser) Bathroom Accessibility: Yes How Accessible: Accessible via walker Home Equipment: Rolling Walker (2 wheels);Rollator (4 wheels);Cane - quad;Toilet riser;Shower seat;Adaptive equipment Adaptive Equipment: Sock aid;Reacher        Prior Functioning/Environment Prior Level of Function : Needs assist       Mobility Comments: Using RW, sleeps in chair at baseline ADLs Comments: Sponge bathes, seated grooming at baseline assist with LB dressing since Kyphoplasty in Nov    OT Problem List: Decreased strength;Decreased range of motion;Decreased activity tolerance;Impaired balance (sitting and/or standing);Decreased safety awareness;Decreased knowledge of use of DME or AE;Cardiopulmonary status limiting activity   OT Treatment/Interventions: Self-care/ADL training;Therapeutic exercise;DME and/or AE instruction;Energy conservation;Therapeutic activities;Patient/family education;Balance training      OT Goals(Current goals can be found in the care plan section)   Acute Rehab OT Goals Patient Stated Goal: To walk without a walker OT Goal Formulation: With patient Time For Goal Achievement: 10/18/23 Potential to Achieve Goals: Good   OT Frequency:  Min 2X/week       AM-PAC OT 6 Clicks Daily Activity     Outcome Measure Help from another person eating meals?: None Help from another person taking care of personal grooming?: A Little Help from another person toileting, which includes using toliet, bedpan, or urinal?: A Little Help from another person bathing (including washing, rinsing, drying)?: A Little Help from another person to put on and taking off regular upper body clothing?: A Little Help from another person to put on and taking off regular lower body clothing?: A Lot 6 Click Score: 18   End of Session Equipment Utilized During Treatment: Gait belt;Rolling walker (2 wheels) Nurse Communication: Mobility status  Activity Tolerance: Patient tolerated treatment  well Patient left: in chair;with  call bell/phone within reach  OT Visit Diagnosis: Unsteadiness on feet (R26.81);Other abnormalities of gait and mobility (R26.89);Muscle weakness (generalized) (M62.81)                Time: 8996-8957 OT Time Calculation (min): 39 min Charges:  OT General Charges $OT Visit: 1 Visit OT Evaluation $OT Eval Moderate Complexity: 1 Mod OT Treatments $Self Care/Home Management : 8-22 mins  Adrianne BROCKS, OT  Acute Rehabilitation Services Office [phone] Secure chat preferred   Adrianne GORMAN Savers 10/04/2023, 11:26 AM

## 2023-10-04 NOTE — Telephone Encounter (Addendum)
 Patient Product/process development scientist completed.    The patient is insured through [employer]/ [employer]. Patient has Medicare and is not eligible for a copay card, but may be able to apply for patient assistance or Medicare RX Payment Plan (Patient Must reach out to their plan, if eligible for payment plan), if available.    Ran test claim for Eliquis  5 mg and the current 30 day co-pay is $22.50.  Ran test claim for enoxaparin  (Lovenox ) 150 mg and the current 7 day co-pay is $22.50.  This test claim was processed through [facility]- copay amounts may vary at other pharmacies due to pharmacy/plan contracts, or as the patient moves through the different stages of their insurance plan.     Reyes Sharps, CPHT Pharmacy Technician III Certified Patient Advocate [facility] Patient Advocate Team Direct Number: [phone]  Fax: [phone]

## 2023-10-05 DIAGNOSIS — I4891 Unspecified atrial fibrillation: Secondary | ICD-10-CM | POA: Diagnosis not present

## 2023-10-05 DIAGNOSIS — I5033 Acute on chronic diastolic (congestive) heart failure: Secondary | ICD-10-CM | POA: Diagnosis not present

## 2023-10-05 LAB — CBC
HCT: 45.5 % (ref 36.0–46.0)
Hemoglobin: 14 g/dL (ref 12.0–15.0)
MCH: 27.5 pg (ref 26.0–34.0)
MCHC: 30.8 g/dL (ref 30.0–36.0)
MCV: 89.2 fL (ref 80.0–100.0)
Platelets: 251 10*3/uL (ref 150–400)
RBC: 5.1 MIL/uL (ref 3.87–5.11)
RDW: 14.1 % (ref 11.5–15.5)
WBC: 10.9 10*3/uL — ABNORMAL HIGH (ref 4.0–10.5)
nRBC: 0 % (ref 0.0–0.2)

## 2023-10-05 LAB — GLUCOSE, CAPILLARY
Glucose-Capillary: 124 mg/dL — ABNORMAL HIGH (ref 70–99)
Glucose-Capillary: 147 mg/dL — ABNORMAL HIGH (ref 70–99)
Glucose-Capillary: 177 mg/dL — ABNORMAL HIGH (ref 70–99)
Glucose-Capillary: 216 mg/dL — ABNORMAL HIGH (ref 70–99)

## 2023-10-05 LAB — BASIC METABOLIC PANEL WITH GFR
Anion gap: 11 (ref 5–15)
BUN: 14 mg/dL (ref 8–23)
CO2: 23 mmol/L (ref 22–32)
Calcium: 8.5 mg/dL — ABNORMAL LOW (ref 8.9–10.3)
Chloride: 103 mmol/L (ref 98–111)
Creatinine, Ser: 0.74 mg/dL (ref 0.44–1.00)
GFR, Estimated: >60 mL/min (ref >60)
Glucose, Bld: 138 mg/dL — ABNORMAL HIGH (ref 70–99)
Potassium: 3.6 mmol/L (ref 3.5–5.1)
Sodium: 137 mmol/L (ref 135–145)

## 2023-10-05 MED ORDER — LIDOCAINE 5 % EX PTCH
1.0000 | MEDICATED_PATCH | Freq: Every day | CUTANEOUS | Status: DC
  Filled 2023-10-05 (×5): qty 1

## 2023-10-05 MED ORDER — OXYCODONE HCL 5 MG PO TABS
10.0000 mg | ORAL_TABLET | ORAL | Status: DC | PRN
  Administered 2023-10-05 – 2023-10-17 (×39): 10 mg via ORAL
  Filled 2023-10-05 (×39): qty 2

## 2023-10-05 MED ORDER — HYDROMORPHONE HCL 1 MG/ML IJ SOLN
0.5000 mg | INTRAMUSCULAR | Status: DC | PRN
  Administered 2023-10-05 – 2023-10-12 (×5): 0.5 mg via INTRAVENOUS
  Filled 2023-10-05 (×6): qty 1

## 2023-10-05 MED ORDER — DILTIAZEM HCL 30 MG PO TABS
30.0000 mg | ORAL_TABLET | Freq: Two times a day (BID) | ORAL | Status: DC
  Administered 2023-10-05 (×2): 30 mg via ORAL
  Filled 2023-10-05 (×2): qty 1

## 2023-10-05 NOTE — Progress Notes (Signed)
 Notified Cardiology bedside when patient was up and walking heart rate increased to 190's to 200's.

## 2023-10-05 NOTE — Plan of Care (Signed)
  Problem: Coping: Goal: Ability to adjust to condition or change in health will improve Outcome: Progressing   Problem: Fluid Volume: Goal: Ability to maintain a balanced intake and output will improve Outcome: Progressing   Problem: Health Behavior/Discharge Planning: Goal: Ability to manage health-related needs will improve Outcome: Progressing   Problem: Skin Integrity: Goal: Risk for impaired skin integrity will decrease Outcome: Progressing   Problem: Tissue Perfusion: Goal: Adequacy of tissue perfusion will improve Outcome: Progressing   Problem: Clinical Measurements: Goal: Will remain free from infection Outcome: Progressing Goal: Respiratory complications will improve Outcome: Progressing Goal: Cardiovascular complication will be avoided Outcome: Progressing   Problem: Activity: Goal: Risk for activity intolerance will decrease Outcome: Progressing   Problem: Elimination: Goal: Will not experience complications related to urinary retention Outcome: Progressing   Problem: Pain Managment: Goal: General experience of comfort will improve and/or be controlled Outcome: Progressing   Problem: Safety: Goal: Ability to remain free from injury will improve Outcome: Progressing

## 2023-10-05 NOTE — TOC Initial Note (Addendum)
 Transition of Care [facility]) - Initial/Assessment Note    Patient Details  Name: Tracie [name] MRN: [id number] Date of Birth: 1961-04-08  [facility]) CM/SW Contact:    Hartley KATHEE Robertson, LCSWA Phone Number: 10/05/2023, 12:41 PM  Clinical Narrative:                  CSW spoke with pt in reference to SNF recs by PT, pt states she is not interested in STR but would be open to having in home services, RNCM made aware. TOC will continue to follow.        Patient Goals and CMS Choice            Expected Discharge Plan and Services                                              Prior Living Arrangements/Services                       Activities of Daily Living   ADL Screening (condition at time of admission) Independently performs ADLs?: No Does the patient have a NEW difficulty with bathing/dressing/toileting/self-feeding that is expected to last >3 days?: Yes (Initiates electronic notice to provider for possible OT consult) Does the patient have a NEW difficulty with getting in/out of bed, walking, or climbing stairs that is expected to last >3 days?: Yes (Initiates electronic notice to provider for possible PT consult) Does the patient have a NEW difficulty with communication that is expected to last >3 days?: No Is the patient deaf or have difficulty hearing?: No Does the patient have difficulty seeing, even when wearing glasses/contacts?: No Does the patient have difficulty concentrating, remembering, or making decisions?: No  Permission Sought/Granted                  Emotional Assessment              Admission diagnosis:  Leg edema [R60.0] SOB (shortness of breath) [R06.02] Persistent atrial fibrillation (HCC) [I48.19] Acute on chronic diastolic CHF (congestive heart failure) (HCC) [I50.33] Chronic midline low back pain without sciatica [M54.50, G89.29] Acute on chronic heart failure with preserved ejection fraction  (HFpEF) (HCC) [I50.33] Patient Active Problem List   Diagnosis Date Noted   Acute on chronic heart failure with preserved ejection fraction (HFpEF) (HCC) 10/03/2023   SOB (shortness of breath) 10/03/2023   Persistent atrial fibrillation (HCC) 10/03/2023   Lumbar burst fracture (HCC) 10/03/2023   Acute on chronic diastolic CHF (congestive heart failure) (HCC) 10/03/2023   Atrial fibrillation with rapid ventricular response (HCC) 02/24/2023   Controlled type 2 diabetes mellitus without complication, without long-term current use of insulin  (HCC) 02/24/2023   Lumbar pain 02/24/2023   Leukocytosis 02/24/2023   Hypokalemia 02/24/2023   Myalgia 10/27/2018   Arthralgia of knee, right 10/27/2018   Varicose veins of both lower extremities with pain 07/16/2018   Hyperlipidemia 06/03/2018   Essential hypertension, benign 06/03/2018   PCP:  Rolinda Millman, MD Pharmacy:   [id number] [place], [place] - [address] AT [facility] OF [address] [place] [address] Phone: [phone] Fax: [phone]  [facility] [address] [place] [address] Phone: [phone] Fax: [phone]     Social Drivers of Health (SDOH) Social  History: SDOH Screenings   Food Insecurity: No Food Insecurity (10/03/2023)  Housing: Low Risk  (10/03/2023)  Transportation Needs: No Transportation Needs (10/03/2023)  Utilities: Not At Risk (10/03/2023)  Depression (PHQ2-9): Low Risk  (02/26/2020)  Tobacco Use: Medium Risk (10/03/2023)   SDOH Interventions:     Readmission Risk Interventions     No data to display

## 2023-10-05 NOTE — Progress Notes (Signed)
 Physical Therapy Treatment Patient Details Name: Tracie [name] MRN: [id number] DOB: 04/08/1961 Today's Date: 10/05/2023   History of Present Illness Pt is a 63 y.o female admitted 6/25 for bil leg swelling and back pain. Chest x-ray showed cardiomegaly and vascular congestion. MRI showed L1-L2 fxs & mod spinal stenosis. Neurosurgery recommended conservative management. PMH: Kyphoplasty Nov 2024, DVTs, A-fib, DM    PT Comments  Patient met in supine, agreeable to participate in PT. Reports improvement in LE edema but continued low back pain.  Patient completed supine to sit transfer with supervision assist. HR at EOB 117 bpm, asymptomatic tachycardia. Patient completed STS transfer to 2WW with CGA. Able to participate in ambulation for 70 feet with 2WW and CGA. Demonstrates excessive lateral trunk sway and lateral walker movement secondary to LE weakness and edema. Patient able to maintain conversation with gait, reporting only mild SOB however HR peaked at 203 bpm. RN present during HR elevation for monitoring. Returned to seated position in chair, recovery took 2-3 minutes to return HR < 150 bpm. BP recorded at 166/97 mmHg. Patient left with RN for monitoring. Patient limited in activity by poor functional capacity and due to limited onset of symptoms, patient with poor judgement for monitoring safe mobility. PT continues to recommend skilled acute PT services < 3 hours/day however, if patient were to return home, would recommend HHPT services and 24/7 supervision/assist.    If plan is discharge home, recommend the following: A little help with walking and/or transfers;A little help with bathing/dressing/bathroom;Assistance with cooking/housework;Assist for transportation;Help with stairs or ramp for entrance;Direct supervision/assist for financial management   Can travel by private vehicle     Yes  Equipment Recommendations  None recommended by PT;Other (comment) (Patient reports having  necessary equipment. Continue to assess.)    Recommendations for Other Services       Precautions / Restrictions Precautions Precautions: Other (comment) Precaution/Restrictions Comments: low back pain with L1-L2 compression fractures; monitor HR with activity Restrictions Weight Bearing Restrictions Per Provider Order: No     Mobility  Bed Mobility Overal bed mobility: Needs Assistance Bed Mobility: Supine to Sit     Supine to sit: HOB elevated, Used rails, Supervision          Transfers Overall transfer level: Needs assistance Equipment used: Rolling walker (2 wheels) Transfers: Sit to/from Stand Sit to Stand: Contact guard assist           General transfer comment: Requires significant momentum to obtain standing position    Ambulation/Gait Ambulation/Gait assistance: Contact guard assist Gait Distance (Feet): 70 Feet Assistive device: Rolling walker (2 wheels) Gait Pattern/deviations: Step-through pattern, Decreased step length - right, Decreased step length - left, Decreased stride length, Wide base of support, Decreased weight shift to right, Decreased weight shift to left   Gait velocity interpretation: <1.31 ft/sec, indicative of household ambulator   General Gait Details: Patient demonstrates excessive R/L lateral weight shift for advancing LEs due to weakness and edema. Demonstrates tendency to shift walker excessively during gait. Reduced step length and forward flexed posture. HR peaked at 203 bpm during gait, RN present during HR elevation. Returning to < 150 bpm with 2-3 minutes in seated position.        Balance Overall balance assessment: Needs assistance Sitting-balance support: Feet supported, No upper extremity supported Sitting balance-Leahy Scale: Fair     Standing balance support: Reliant on assistive device for balance, During functional activity, Bilateral upper extremity supported Standing balance-Leahy Scale: Poor Standing balance  comment:  Requires support of RW for ambulation                            Communication Communication Communication: No apparent difficulties  Cognition Arousal: Alert Behavior During Therapy: WFL for tasks assessed/performed   PT - Cognitive impairments: Safety/Judgement                         Following commands: Intact      Cueing Cueing Techniques: Verbal cues  Exercises      General Comments        Pertinent Vitals/Pain Pain Assessment Pain Assessment: 0-10 Pain Score: 7  Pain Location: Back, LEs Pain Descriptors / Indicators: Aching, Heaviness Pain Intervention(s): Monitored during session, Repositioned, Patient requesting pain meds-RN notified    Home Living                          Prior Function            PT Goals (current goals can now be found in the care plan section) Acute Rehab PT Goals Patient Stated Goal: able to do more and be independent PT Goal Formulation: With patient Time For Goal Achievement: 10/18/23 Potential to Achieve Goals: Fair Progress towards PT goals: Progressing toward goals    Frequency    Min 3X/week      PT Plan      Co-evaluation              AM-PAC PT 6 Clicks Mobility   Outcome Measure  Help needed turning from your back to your side while in a flat bed without using bedrails?: A Little Help needed moving from lying on your back to sitting on the side of a flat bed without using bedrails?: A Little Help needed moving to and from a bed to a chair (including a wheelchair)?: A Little Help needed standing up from a chair using your arms (e.g., wheelchair or bedside chair)?: A Little Help needed to walk in hospital room?: A Little Help needed climbing 3-5 steps with a railing? : A Lot 6 Click Score: 17    End of Session Equipment Utilized During Treatment: Gait belt Activity Tolerance: Patient tolerated treatment well;Other (comment) (tachycardia) Patient left: with call  bell/phone within reach;with family/visitor present;in chair Nurse Communication: Mobility status;Other (comment) (HR elevation with ambulation, BP following gait, asymptomatic other than SOB.) PT Visit Diagnosis: Unsteadiness on feet (R26.81);Other abnormalities of gait and mobility (R26.89);Difficulty in walking, not elsewhere classified (R26.2);Pain     Time: 9158-9095 PT Time Calculation (min) (ACUTE ONLY): 23 min  Charges:    $Gait Training: 8-22 mins $Therapeutic Activity: 8-22 mins                       Sherryle Lake Lillian, PT, DPT [facility] Acute Rehabilitation Office: [phone]   Sherryle VEAR [place] 10/05/2023, 9:37 AM

## 2023-10-05 NOTE — Progress Notes (Signed)
 Rounding Note   Patient Name: Tracie [name] Date of Encounter: 10/05/2023  [facility] Cardiologist: Ozell Fell, MD   Subjective SOB is improving.   Scheduled Meds:  apixaban   5 mg Oral BID   celecoxib   200 mg Oral Daily   furosemide   40 mg Intravenous BID   insulin  aspart  0-5 Units Subcutaneous QHS   insulin  aspart  0-6 Units Subcutaneous TID WC   losartan   100 mg Oral Daily   metoprolol  tartrate  50 mg Oral QID   potassium chloride   40 mEq Oral BID   sodium chloride  flush  3 mL Intravenous Q12H   Continuous Infusions:  PRN Meds: acetaminophen  **OR** acetaminophen , HYDROmorphone  (DILAUDID ) injection, ketorolac , methocarbamol , ondansetron  **OR** ondansetron  (ZOFRAN ) IV, oxyCODONE , polyethylene glycol   Vital Signs  Vitals:   10/04/23 2107 10/04/23 2335 10/05/23 0506 10/05/23 0829  BP: (!) 140/73  137/85 138/86  Pulse: 99  97 (!) 105  Resp:  20 20 20   Temp:  98.6 F (37 C) 98.5 F (36.9 C) 98.2 F (36.8 C)  TempSrc:  Oral Oral Oral  SpO2:  94% 91% 93%  Weight:   (!) 148.9 kg   Height:        Intake/Output Summary (Last 24 hours) at 10/05/2023 0851 Last data filed at 10/05/2023 0831 Gross per 24 hour  Intake 1168 ml  Output 3250 ml  Net -2082 ml      10/05/2023    5:06 AM 10/04/2023    4:07 AM 10/03/2023    5:13 PM  Last 3 Weights  Weight (lbs) 328 lb 4.2 oz 330 lb 7.5 oz 331 lb 12.7 oz  Weight (kg) 148.9 kg 149.9 kg 150.5 kg      Telemetry Afib variable rates - Personally Reviewed  ECG  N/a - Personally Reviewed  Physical Exam  GEN: No acute distress.   Neck: No JVD Cardiac: ireg Respiratory: Clear to auscultation bilaterally. GI: Soft, nontender, non-distended  MS: 2+ bilateral LE edema Neuro:  Nonfocal  Psych: Normal affect   Labs High Sensitivity Troponin:  No results for input(s): TROPONINIHS in the last 720 hours.   Chemistry Recent Labs  Lab 10/02/23 1711 10/03/23 0523 10/04/23 0246 10/05/23 0309  NA 140  139 139 137  K 3.4* 2.9* 3.5 3.6  CL 107 107 105 103  CO2 25 21* 24 23  GLUCOSE 143* 160* 138* 138*  BUN 11 10 12 14   CREATININE 0.70 0.59 0.73 0.74  CALCIUM 9.0 8.6* 9.0 8.5*  MG  --  1.5*  --   --   PROT 8.6*  --   --   --   ALBUMIN 3.6  --   --   --   AST 19  --   --   --   ALT 14  --   --   --   ALKPHOS 60  --   --   --   BILITOT 0.9  --   --   --   GFRNONAA >60 >60 >60 >60  ANIONGAP 8 11 10 11     Lipids No results for input(s): CHOL, TRIG, HDL, LABVLDL, LDLCALC, CHOLHDL in the last 168 hours.  Hematology Recent Labs  Lab 10/03/23 0523 10/04/23 0246 10/05/23 0309  WBC 11.5* 10.7* 10.9*  RBC 5.25* 5.02 5.10  HGB 14.2 13.7 14.0  HCT 46.9* 44.9 45.5  MCV 89.3 89.4 89.2  MCH 27.0 27.3 27.5  MCHC 30.3 30.5 30.8  RDW 14.1 14.4 14.1  PLT 254  270 251   Thyroid No results for input(s): TSH, FREET4 in the last 168 hours.  BNP Recent Labs  Lab 10/02/23 1730  BNP 189.8*    DDimer No results for input(s): DDIMER in the last 168 hours.   Radiology  ECHOCARDIOGRAM COMPLETE Result Date: 10/04/2023    ECHOCARDIOGRAM REPORT   Patient Name:   Tracie [name] Date of Exam: 10/04/2023 Medical Rec #:  [id number]           Height:       70.5 in Accession #:    [id number]          Weight:       330.5 lb Date of Birth:  11-04-1960           BSA:          2.598 m Patient Age:    62 years            BP:           135/81 mmHg Patient Gender: F                   HR:           100 bpm. Exam Location:  Inpatient Procedure: 2D Echo, Cardiac Doppler and Color Doppler (Both Spectral and Color            Flow Doppler were utilized during procedure). Indications:    CHF I50.31 Afib I48.91  History:        Patient has prior history of Echocardiogram examinations, most                 recent 02/25/2023.  Sonographer:    Tinnie Gosling RDCS Referring Phys: [id number] LOGAN N LOCKWOOD IMPRESSIONS  1. Left ventricular ejection fraction, by estimation, is 60 to 65%. The left ventricle has  normal function. Left ventricular endocardial border not optimally defined to evaluate regional wall motion. There is mild concentric left ventricular hypertrophy. Left ventricular diastolic function could not be evaluated.  2. Right ventricular systolic function is normal. The right ventricular size is normal.  3. Left atrial size was mild to moderately dilated.  4. Right atrial size was mild to moderately dilated.  5. The mitral valve is normal in structure. No evidence of mitral valve regurgitation. No evidence of mitral stenosis.  6. The aortic valve was not well visualized. There is mild calcification of the aortic valve. Aortic valve regurgitation is not visualized. Aortic valve sclerosis/calcification is present, without any evidence of aortic stenosis.  7. The inferior vena cava is normal in size with greater than 50% respiratory variability, suggesting right atrial pressure of 3 mmHg. Conclusion(s)/Recommendation(s): Very poor image quality due to poor sound wave transmission. LV endocardium never seen well. FINDINGS  Left Ventricle: Left ventricular ejection fraction, by estimation, is 60 to 65%. The left ventricle has normal function. Left ventricular endocardial border not optimally defined to evaluate regional wall motion. The left ventricular internal cavity size was normal in size. There is mild concentric left ventricular hypertrophy. Left ventricular diastolic function could not be evaluated due to atrial fibrillation. Left ventricular diastolic function could not be evaluated. Right Ventricle: The right ventricular size is normal. No increase in right ventricular wall thickness. Right ventricular systolic function is normal. Left Atrium: Left atrial size was mild to moderately dilated. Right Atrium: Right atrial size was mild to moderately dilated. Pericardium: There is no evidence of pericardial effusion. Mitral Valve: The mitral valve is normal in structure. No  evidence of mitral valve  regurgitation. No evidence of mitral valve stenosis. Tricuspid Valve: The tricuspid valve is not well visualized. Tricuspid valve regurgitation is trivial. No evidence of tricuspid stenosis. Aortic Valve: The aortic valve was not well visualized. There is mild calcification of the aortic valve. Aortic valve regurgitation is not visualized. Aortic valve sclerosis/calcification is present, without any evidence of aortic stenosis. Pulmonic Valve: The pulmonic valve was not well visualized. Pulmonic valve regurgitation is trivial. No evidence of pulmonic stenosis. Aorta: The aortic root is normal in size and structure. Venous: The inferior vena cava is normal in size with greater than 50% respiratory variability, suggesting right atrial pressure of 3 mmHg. IAS/Shunts: No atrial level shunt detected by color flow Doppler.  LEFT VENTRICLE PLAX 2D LVIDd:         4.60 cm LVIDs:         3.30 cm LV PW:         1.30 cm LV IVS:        1.20 cm LVOT diam:     2.30 cm LV SV:         51 LV SV Index:   20 LVOT Area:     4.15 cm  IVC IVC diam: 2.30 cm LEFT ATRIUM           Index LA diam:      4.50 cm 1.73 cm/m LA Vol (A4C): 84.4 ml 32.49 ml/m  AORTIC VALVE LVOT Vmax:   67.10 cm/s LVOT Vmean:  44.600 cm/s LVOT VTI:    0.122 m  AORTA Ao Root diam: 3.20 cm Ao Asc diam:  3.70 cm  SHUNTS Systemic VTI:  0.12 m Systemic Diam: 2.30 cm Toribio Fuel MD Electronically signed by Toribio Fuel MD Signature Date/Time: 10/04/2023/8:59:01 AM    Final    VAS US  LOWER EXTREMITY VENOUS (DVT) Result Date: 10/03/2023  Lower Venous DVT Study Patient Name:  CHE BELOW Schamp  Date of Exam:   10/03/2023 Medical Rec #: [id number]            Accession #:    [id number] Date of Birth: 14-May-1960            Patient Gender: F Patient Age:   36 years Exam Location:  [facility] Procedure:      VAS US  LOWER EXTREMITY VENOUS (DVT) Referring Phys: TIMOTHY OPYD --------------------------------------------------------------------------------   Indications: Swelling, and Edema.  Risk Factors: DVT Hx of DVT. Limitations: Body habitus and Pt is unable to tolerate compression in groin and thigh area therefore the contralateral groin is not assessed. Pt screams loudly, tenses and holds breath. Performing Technologist: Elmarie Lindau, RVT  Examination Guidelines: A complete evaluation includes B-mode imaging, spectral Doppler, color Doppler, and power Doppler as needed of all accessible portions of each vessel. Bilateral testing is considered an integral part of a complete examination. Limited examinations for reoccurring indications may be performed as noted. The reflux portion of the exam is performed with the patient in reverse Trendelenburg.  +---------+---------------+---------+-----------+----------+---------------+ RIGHT    CompressibilityPhasicitySpontaneityPropertiesThrombus Aging  +---------+---------------+---------+-----------+----------+---------------+ CFV      Full           Yes      Yes                                  +---------+---------------+---------+-----------+----------+---------------+ SFJ      Full                                                         +---------+---------------+---------+-----------+----------+---------------+  FV Prox  Full                                                         +---------+---------------+---------+-----------+----------+---------------+ FV Mid                  Yes                           full color flow +---------+---------------+---------+-----------+----------+---------------+ FV Distal               Yes                           full color flow +---------+---------------+---------+-----------+----------+---------------+ PFV      Full                                                         +---------+---------------+---------+-----------+----------+---------------+ POP      Full           Yes      Yes                                   +---------+---------------+---------+-----------+----------+---------------+ PTV      Full                                                         +---------+---------------+---------+-----------+----------+---------------+ PERO     Full                                                         +---------+---------------+---------+-----------+----------+---------------+        Summary: RIGHT: - There is no evidence of deep vein thrombosis in the lower extremity.  - No cystic structure found in the popliteal fossa.   *See table(s) above for measurements and observations. Electronically signed by Debby Robertson on 10/03/2023 at 9:39:31 PM.    Final    MR LUMBAR SPINE WO CONTRAST Result Date: 10/03/2023 CLINICAL DATA:  Low back pain, prior surgery, new symptoms EXAM: MRI LUMBAR SPINE WITHOUT CONTRAST TECHNIQUE: Multiplanar, multisequence MR imaging of the lumbar spine was performed. No intravenous contrast was administered. COMPARISON:  Plain radiographs the lumbar spine dated October 03, 2023 and MRI of the lumbar spine dated February 26, 2023. FINDINGS: Segmentation:  Standard Alignment: Focal kyphosis at L1 secondary to marked vertebral compression deformity status post recent kyphoplasty. Vertebrae: Since the previous MRI, the patient has undergone bilateral kyphoplasty at L1. There has also been significant interval worsening of the compression fracture and worsening retropulsion of the posterior wall of the vertebral body, which now protrudes approximately 9 mm posteriorly, resulting in moderate central spinal canal stenosis, which is worse on the  left. There has also been interval development of a superior endplate compression deformity of L2, which appears to be chronic as there is no increased signal present on the STIR sequence. There are hemangiomas present within the L3 and L4 vertebrae. Conus medullaris and cauda equina: Conus extends to the midbody of L1 level. Conus and cauda equina appear  normal. Paraspinal and other soft tissues: There is a simple appearing cyst again seen arising posteriorly from the left kidney. No follow-up is necessary. The paraspinous soft tissues are otherwise unremarkable. Disc levels: T12-L1: There is a new small right paracentral protrusion seen best on the sagittal sequences, image 8. There is mild right-sided central spinal canal stenosis. The neural foramina are widely patent. L1-2: There is moderate central spinal canal stenosis secondary to retropulsion of the posteroinferior corner of the vertebral body. There is no impingement upon the conus medullaris or cauda equina. The neural foramina are patent. L2-3: Mild diffuse disc bulging and mild bilateral facet arthrosis with mild central spinal canal stenosis. The neural foramina are widely patent. L3-4: Normal. L4-5: Mild diffuse disc bulging, endplate ridging and facet hypertrophy, with mild central spinal canal stenosis and mild bilateral lateral recess stenosis. No apparent nerve root impingement. L5-S1: Normal. IMPRESSION: 1. Interval development of worsening central spinal canal stenosis at L1 secondary to interval worsening of a compression fracture at L1 and interval development of a superior endplate compression deformity of L2, which has lost approximately 20% of its height anteriorly. There is moderate focal kyphosis as a result. Electronically Signed   By: Evalene Coho M.D.   On: 10/03/2023 12:51    Cardiac Studies   Patient Profile   63 year old here with atrial fibrillation rapid ventricular response, hypokalemia, suspected diastolic heart failure exacerbation hypertension type 2 diabetes. Came in with chief complaint of leg swelling shortness of breath and low back pain.   Assessment & Plan  Afib with RVR - reports arm pains on higher doses of toprol  - has been on lopressor  50mg  qid - on eliquis  for stroke prevention - rates elevated at times, particularly with ambulation.  - add  diltiazem  30mg  bid.   2. Acute on chronic diastolic HF - 09/2023 echo: LVEF 60-65%, indet diastolic, normal RV CXR vascular congestion, BNP 190 - on IV lasix  40mg  bid, negative 2.5 L yesterday and 3.7 L since admission. Mild variation in Cr without clear trend. Ongoing fluid overload, continue IV diuresis    For questions or updates, please contact [facility] Please consult [email or web address] for contact info under     Signed, Alvan Carrier, MD  10/05/2023, 8:51 AM

## 2023-10-05 NOTE — Progress Notes (Signed)
 [facility] Progress Note Patient: Yemariam Magar FMW:[id number] DOB: 03/06/1961 DOA: 10/02/2023  DOS: the patient was seen and examined on 10/05/2023  Brief Hospital Course: Winfred Redel is a 63 y.o. female with medical history significant for hypertension, type 2 diabetes mellitus, history of DVT, PAF on Eliquis , chronic HFpEF, and chronic low back pain who presents with worsening bilateral lower extremity swelling, shortness of breath, and back pain.  Patient is admitted to the hospitalist service for further management evaluation of heart failure exacerbation, A-fib RVR, severe back pain.   Patient found to have L1-L2 fracture on x-ray, neurosurgery consulted.  Cardiology consulted for CHF exacerbation, A-fib with RVR.  Patient is started on IV Lasix , beta-blocker therapy and Eliquis .  She seems to be noncompliant with her medications.   Assessment and Plan: Persistent A-fib with RVR: HR elevated even with mild exertion, getting out of bed. Cardiology consult appreciated. Patient was on Toprol -XL although patient wanted to take higher dose of metoprolol . Currently on metoprolol  tartrate 50 mg 4 times daily. Cardizem  30 mg twice daily added due to uncontrolled RVR. Currently on Eliquis  as well. Monitor on telemetry   Acute on chronic diastolic heart failure: Continue IV diuresis. Recent EF 60-65%. Monitor daily weights, strict input and output. Fluid/salt restriction advised.  CHF education provided.   Hypokalemia: Replaced.   L1 compression fracture- H/o kyphoplasty 02/2023 MRI lumbar spine reviewed shows interval development of worsening central spinal canal stenosis at L1 secondary to compression fracture, interval development of superior endplate compression deformity of L2. Seen by neurosurgery-advised conservative management due to her morbid obesity, anatomy, cardiac risk factors, she is not a surgical candidate. Continue pain control with IV Dilaudid ,  Toradol , Robaxin .  Patient wishes to take her home dose Celebrex .  PT OT evaluation, activity as tolerated.   Morbid obesity Body mass index is 46.44 kg/m.  Complicating further her current condition. She understands to lose weight. Diet, exercise and weight reduction advised.  Type 2 diabetes mellitus.  Well-controlled without long-term insulin  use without complication. Hemoglobin A1c 6.2.  In the past hemoglobin A1c has been at 7.6. On glipizide 2.5 mg daily.  Also on Jardiance . Currently on sliding scale insulin .   Subjective: No nausea no vomiting.  Continues to complain about having back pain which is exacerbated by the bed.  Reports constipation but does not want to use bedside commode.  Physical Exam: General: in Mild distress, No Rash Cardiovascular: S1 and S2 Present, No Murmur Respiratory: Good respiratory effort, Bilateral Air entry present. No Crackles, No wheezes Abdomen: Bowel Sound present, No tenderness Extremities: Bilateral edema Neuro: Alert and oriented x3, no new focal deficit  Data Reviewed: I have Reviewed nursing notes, Vitals, and Lab results. Since last encounter, pertinent lab results CBC and BMP   . I have ordered test including CBC and BMP  .   Disposition: Status is: Inpatient Remains inpatient appropriate because: Monitor for improvement in volume overload.  apixaban  (ELIQUIS ) tablet 5 mg   Family Communication: Family at bedside Level of care: Telemetry Cardiac   Vitals:   10/05/23 1116 10/05/23 1314 10/05/23 1540 10/05/23 1658  BP: 135/88 135/88 132/74 (!) 132/94  Pulse: 90 90 94 94  Resp: 20  20   Temp: 97.9 F (36.6 C)  97.9 F (36.6 C)   TempSrc: Oral  Oral   SpO2: 95%  94%   Weight:      Height:         Author: Yetta Blanch, MD 10/05/2023 6:36  PM  Please look on [email or web address] to find out who is on call.

## 2023-10-05 NOTE — Hospital Course (Addendum)
 Mrs. Gal was admitted to the hospital with the working diagnosis of heart failure exacerbation.   63 y.o. female with medical history significant for hypertension, type 2 diabetes mellitus, history of DVT, paroxysmal atrial fibrillation, heart failure, and chronic low back pain who presents with worsening bilateral lower extremity edema, dyspnea, and back pain.  She has been not adherent to her medical therapy as outpatient.  On her initial physical examination her blood pressure was 157/93, HR 100, RR 16 and 02 saturation 92%. Lungs with no wheezing or rhonchi, positive dyspnea with mininal efforts, heart with S1 and S2 present and regular with no gallops, or rubs, abdomen with no distention and positive lower extremity edema.   Na 140, K 3,4 Cl 107 bicarbonate 25 glucose 143, bun 11 cr 0,70  AST 19 ALT 14  BNP 189.8 Wbc 10,2 hgb 14.9 plt 289   Chest radiograph with cardiomegaly with bilateral hilar vascular congestion.  L spine radiograph with progressive loss of height of the L1 fracture with methyl methacrylate present outside of the vertebral body.  New superior endplate fracture at L2 Exaggerated kyphosis.   CT chest with contrast with no evidence of pulmonary embolism.  Cardiac enlargement, with small pericardial effusion.  Patchy airspace changes in the lungs most likely edema.  4.9 cm diameter ascending thoracic aortic aneurysm, recommended semi annual follow up imaging.   Neurosurgery consulted, with recommendations to continue pain control, weight loss and physical therapy.   Patient is started on IV Lasix , beta-blocker therapy and Eliquis .  She seems to be noncompliant with her medications. - Improved with diuresis, supportive care and pain meds for her low back, -Eventually discharged 7/5 AM -Subsequently 7/5 early afternoon prior to discharge after turning started having severe left lateral wall pleuritic chest pain, urgent CTA chest negative for PE - Difficulty with pain  control, added Decadron , lidocaine  patch -7/7 started Toradol , MRI LS spine no new findings apart from L1 and L2 fracture  07/10 improved pain control and mobility, patient will be discharged home and instructions for close follow up as outpatient.

## 2023-10-06 DIAGNOSIS — I5033 Acute on chronic diastolic (congestive) heart failure: Secondary | ICD-10-CM | POA: Diagnosis not present

## 2023-10-06 DIAGNOSIS — I4891 Unspecified atrial fibrillation: Secondary | ICD-10-CM | POA: Diagnosis not present

## 2023-10-06 LAB — GLUCOSE, CAPILLARY
Glucose-Capillary: 169 mg/dL — ABNORMAL HIGH (ref 70–99)
Glucose-Capillary: 194 mg/dL — ABNORMAL HIGH (ref 70–99)
Glucose-Capillary: 184 mg/dL — ABNORMAL HIGH (ref 70–99)
Glucose-Capillary: 242 mg/dL — ABNORMAL HIGH (ref 70–99)

## 2023-10-06 LAB — BASIC METABOLIC PANEL WITH GFR
Anion gap: 8 (ref 5–15)
BUN: 11 mg/dL (ref 8–23)
CO2: 27 mmol/L (ref 22–32)
Calcium: 8.2 mg/dL — ABNORMAL LOW (ref 8.9–10.3)
Chloride: 100 mmol/L (ref 98–111)
Creatinine, Ser: 0.63 mg/dL (ref 0.44–1.00)
GFR, Estimated: >60 mL/min (ref >60)
Glucose, Bld: 150 mg/dL — ABNORMAL HIGH (ref 70–99)
Potassium: 3.2 mmol/L — ABNORMAL LOW (ref 3.5–5.1)
Sodium: 135 mmol/L (ref 135–145)

## 2023-10-06 LAB — CBC
HCT: 43.5 % (ref 36.0–46.0)
Hemoglobin: 13.7 g/dL (ref 12.0–15.0)
MCH: 28 pg (ref 26.0–34.0)
MCHC: 31.5 g/dL (ref 30.0–36.0)
MCV: 88.8 fL (ref 80.0–100.0)
Platelets: 246 10*3/uL (ref 150–400)
RBC: 4.9 MIL/uL (ref 3.87–5.11)
RDW: 13.9 % (ref 11.5–15.5)
WBC: 10 10*3/uL (ref 4.0–10.5)
nRBC: 0 % (ref 0.0–0.2)

## 2023-10-06 LAB — MAGNESIUM: Magnesium: 1.3 mg/dL — ABNORMAL LOW (ref 1.7–2.4)

## 2023-10-06 MED ORDER — DILTIAZEM HCL 60 MG PO TABS
60.0000 mg | ORAL_TABLET | Freq: Two times a day (BID) | ORAL | Status: DC
  Administered 2023-10-06 – 2023-10-07 (×4): 60 mg via ORAL
  Filled 2023-10-06 (×4): qty 1

## 2023-10-06 MED ORDER — ORAL CARE MOUTH RINSE
15.0000 mL | OROMUCOSAL | Status: DC | PRN
Start: 2023-10-06 — End: 2023-10-17

## 2023-10-06 MED ORDER — FUROSEMIDE 10 MG/ML IJ SOLN
60.0000 mg | Freq: Two times a day (BID) | INTRAMUSCULAR | Status: DC
  Administered 2023-10-06 – 2023-10-07 (×4): 60 mg via INTRAVENOUS
  Filled 2023-10-06 (×4): qty 6

## 2023-10-06 MED ORDER — POTASSIUM CHLORIDE CRYS ER 20 MEQ PO TBCR
40.0000 meq | EXTENDED_RELEASE_TABLET | Freq: Once | ORAL | Status: AC
  Administered 2023-10-06: 40 meq via ORAL
  Filled 2023-10-06: qty 2

## 2023-10-06 MED ORDER — POTASSIUM CHLORIDE CRYS ER 20 MEQ PO TBCR
40.0000 meq | EXTENDED_RELEASE_TABLET | ORAL | Status: AC
  Administered 2023-10-06 (×2): 40 meq via ORAL
  Filled 2023-10-06: qty 2

## 2023-10-06 MED ORDER — MAGNESIUM SULFATE 4 GM/100ML IV SOLN
4.0000 g | Freq: Once | INTRAVENOUS | Status: AC
  Administered 2023-10-06: 4 g via INTRAVENOUS
  Filled 2023-10-06: qty 100

## 2023-10-06 MED ORDER — PANTOPRAZOLE SODIUM 40 MG PO TBEC
40.0000 mg | DELAYED_RELEASE_TABLET | Freq: Every day | ORAL | Status: DC
  Administered 2023-10-06 – 2023-10-17 (×12): 40 mg via ORAL
  Filled 2023-10-06 (×12): qty 1

## 2023-10-06 MED ORDER — METHOCARBAMOL 500 MG PO TABS
500.0000 mg | ORAL_TABLET | Freq: Three times a day (TID) | ORAL | Status: DC
  Administered 2023-10-06 – 2023-10-14 (×25): 500 mg via ORAL
  Filled 2023-10-06 (×25): qty 1

## 2023-10-06 NOTE — Progress Notes (Signed)
 Rounding Note   Patient Name: Tracie [name] Date of Encounter: 10/06/2023  [facility] Cardiologist: Ozell Fell, MD   Subjective SOB improving but not resolved  Scheduled Meds:  apixaban   5 mg Oral BID   celecoxib   200 mg Oral Daily   diltiazem   30 mg Oral BID   furosemide   40 mg Intravenous BID   insulin  aspart  0-5 Units Subcutaneous QHS   insulin  aspart  0-6 Units Subcutaneous TID WC   lidocaine   1 patch Transdermal Daily   losartan   100 mg Oral Daily   metoprolol  tartrate  50 mg Oral QID   potassium chloride   40 mEq Oral BID   sodium chloride  flush  3 mL Intravenous Q12H   Continuous Infusions:  PRN Meds: acetaminophen  **OR** acetaminophen , HYDROmorphone  (DILAUDID ) injection, methocarbamol , ondansetron  **OR** ondansetron  (ZOFRAN ) IV, oxyCODONE , polyethylene glycol   Vital Signs  Vitals:   10/05/23 2109 10/05/23 2321 10/06/23 0414 10/06/23 0447  BP: 128/64 135/88    Pulse: 97 88    Resp:  18 19   Temp:  98.1 F (36.7 C) 97.9 F (36.6 C)   TempSrc:  Oral Oral   SpO2:  92%    Weight:    (!) 148.3 kg  Height:    5' 10 (1.778 m)    Intake/Output Summary (Last 24 hours) at 10/06/2023 0733 Last data filed at 10/05/2023 2322 Gross per 24 hour  Intake 1057 ml  Output 2950 ml  Net -1893 ml      10/06/2023    4:47 AM 10/05/2023    5:06 AM 10/04/2023    4:07 AM  Last 3 Weights  Weight (lbs) 326 lb 15.1 oz 328 lb 4.2 oz 330 lb 7.5 oz  Weight (kg) 148.3 kg 148.9 kg 149.9 kg      Telemetry Afib variable rates - Personally Reviewed  ECG  N/a - Personally Reviewed  Physical Exam  GEN: No acute distress.   Neck: No JVD Cardiac: irreg Respiratory: Clear to auscultation bilaterally. GI: Soft, nontender, non-distended  MS: 1+ bilateral LE edema Neuro:  Nonfocal  Psych: Normal affect   Labs High Sensitivity Troponin:  No results for input(s): TROPONINIHS in the last 720 hours.   Chemistry Recent Labs  Lab 10/02/23 1711  10/03/23 0523 10/04/23 0246 10/05/23 0309 10/06/23 0438  NA 140 139 139 137 135  K 3.4* 2.9* 3.5 3.6 3.2*  CL 107 107 105 103 100  CO2 25 21* 24 23 27   GLUCOSE 143* 160* 138* 138* 150*  BUN 11 10 12 14 11   CREATININE 0.70 0.59 0.73 0.74 0.63  CALCIUM 9.0 8.6* 9.0 8.5* 8.2*  MG  --  1.5*  --   --  1.3*  PROT 8.6*  --   --   --   --   ALBUMIN 3.6  --   --   --   --   AST 19  --   --   --   --   ALT 14  --   --   --   --   ALKPHOS 60  --   --   --   --   BILITOT 0.9  --   --   --   --   GFRNONAA >60 >60 >60 >60 >60  ANIONGAP 8 11 10 11 8     Lipids No results for input(s): CHOL, TRIG, HDL, LABVLDL, LDLCALC, CHOLHDL in the last 168 hours.  Hematology Recent Labs  Lab 10/04/23 0246 10/05/23 9690  10/06/23 0438  WBC 10.7* 10.9* 10.0  RBC 5.02 5.10 4.90  HGB 13.7 14.0 13.7  HCT 44.9 45.5 43.5  MCV 89.4 89.2 88.8  MCH 27.3 27.5 28.0  MCHC 30.5 30.8 31.5  RDW 14.4 14.1 13.9  PLT 270 251 246   Thyroid No results for input(s): TSH, FREET4 in the last 168 hours.  BNP Recent Labs  Lab 10/02/23 1730  BNP 189.8*    DDimer No results for input(s): DDIMER in the last 168 hours.   Radiology  ECHOCARDIOGRAM COMPLETE Result Date: 10/04/2023    ECHOCARDIOGRAM REPORT   Patient Name:   Tracie [name] Date of Exam: 10/04/2023 Medical Rec #:  [id number]           Height:       70.5 in Accession #:    [id number]          Weight:       330.5 lb Date of Birth:  [birth year]           BSA:          2.598 m Patient Age:    62 years            BP:           135/81 mmHg Patient Gender: F                   HR:           100 bpm. Exam Location:  Inpatient Procedure: 2D Echo, Cardiac Doppler and Color Doppler (Both Spectral and Color            Flow Doppler were utilized during procedure). Indications:    CHF I50.31 Afib I48.91  History:        Patient has prior history of Echocardiogram examinations, most                 recent 02/25/2023.  Sonographer:    Tinnie Gosling RDCS  Referring Phys: [id number] LOGAN N LOCKWOOD IMPRESSIONS  1. Left ventricular ejection fraction, by estimation, is 60 to 65%. The left ventricle has normal function. Left ventricular endocardial border not optimally defined to evaluate regional wall motion. There is mild concentric left ventricular hypertrophy. Left ventricular diastolic function could not be evaluated.  2. Right ventricular systolic function is normal. The right ventricular size is normal.  3. Left atrial size was mild to moderately dilated.  4. Right atrial size was mild to moderately dilated.  5. The mitral valve is normal in structure. No evidence of mitral valve regurgitation. No evidence of mitral stenosis.  6. The aortic valve was not well visualized. There is mild calcification of the aortic valve. Aortic valve regurgitation is not visualized. Aortic valve sclerosis/calcification is present, without any evidence of aortic stenosis.  7. The inferior vena cava is normal in size with greater than 50% respiratory variability, suggesting right atrial pressure of 3 mmHg. Conclusion(s)/Recommendation(s): Very poor image quality due to poor sound wave transmission. LV endocardium never seen well. FINDINGS  Left Ventricle: Left ventricular ejection fraction, by estimation, is 60 to 65%. The left ventricle has normal function. Left ventricular endocardial border not optimally defined to evaluate regional wall motion. The left ventricular internal cavity size was normal in size. There is mild concentric left ventricular hypertrophy. Left ventricular diastolic function could not be evaluated due to atrial fibrillation. Left ventricular diastolic function could not be evaluated. Right Ventricle: The right ventricular size is normal. No increase in right ventricular wall  thickness. Right ventricular systolic function is normal. Left Atrium: Left atrial size was mild to moderately dilated. Right Atrium: Right atrial size was mild to moderately dilated.  Pericardium: There is no evidence of pericardial effusion. Mitral Valve: The mitral valve is normal in structure. No evidence of mitral valve regurgitation. No evidence of mitral valve stenosis. Tricuspid Valve: The tricuspid valve is not well visualized. Tricuspid valve regurgitation is trivial. No evidence of tricuspid stenosis. Aortic Valve: The aortic valve was not well visualized. There is mild calcification of the aortic valve. Aortic valve regurgitation is not visualized. Aortic valve sclerosis/calcification is present, without any evidence of aortic stenosis. Pulmonic Valve: The pulmonic valve was not well visualized. Pulmonic valve regurgitation is trivial. No evidence of pulmonic stenosis. Aorta: The aortic root is normal in size and structure. Venous: The inferior vena cava is normal in size with greater than 50% respiratory variability, suggesting right atrial pressure of 3 mmHg. IAS/Shunts: No atrial level shunt detected by color flow Doppler.  LEFT VENTRICLE PLAX 2D LVIDd:         4.60 cm LVIDs:         3.30 cm LV PW:         1.30 cm LV IVS:        1.20 cm LVOT diam:     2.30 cm LV SV:         51 LV SV Index:   20 LVOT Area:     4.15 cm  IVC IVC diam: 2.30 cm LEFT ATRIUM           Index LA diam:      4.50 cm 1.73 cm/m LA Vol (A4C): 84.4 ml 32.49 ml/m  AORTIC VALVE LVOT Vmax:   67.10 cm/s LVOT Vmean:  44.600 cm/s LVOT VTI:    0.122 m  AORTA Ao Root diam: 3.20 cm Ao Asc diam:  3.70 cm  SHUNTS Systemic VTI:  0.12 m Systemic Diam: 2.30 cm Toribio Fuel MD Electronically signed by Toribio Fuel MD Signature Date/Time: 10/04/2023/8:59:01 AM    Final      Patient Profile   63 year old here with atrial fibrillation rapid ventricular response, hypokalemia, suspected diastolic heart failure exacerbation hypertension type 2 diabetes. Came in with chief complaint of leg swelling shortness of breath and low back pain.   Assessment & Plan  Afib with RVR - reports arm pains on higher doses of toprol ,  she has favored remaining on lopressor  - has been on lopressor  50mg  qid. Dilt 30mg  bid added yeseterday for ongoing tachycardia, particularly with ambulation. Rates remain elevated, increase dilt to 60mg  bid - on eliquis  for stroke prevention   2. Acute on chronic diastolic HF - 09/2023 echo: LVEF 60-65%, indet diastolic, normal RV CXR vascular congestion, BNP 190 - on IV lasix  40mg  bid, negative 1.8 L yesterday and 5.6 L since admission. Mild variation in Cr without clear trend. Weight down 2 lbs over 24 hours.  - remains fluid overloaded, increase IV lasix  to 60mg  bid - resume home SLGT2i closer to discharge.      For questions or updates, please contact [facility] Please consult [email or web address] for contact info under     Signed, Alvan Carrier, MD  10/06/2023, 7:33 AM

## 2023-10-06 NOTE — Progress Notes (Addendum)
 [facility] Progress Note Patient: Tracie [name] FMW:[id number] DOB: July 18, 1960 DOA: 10/02/2023  DOS: the patient was seen and examined on 10/06/2023  Brief Hospital Course: Tracie [name] is a 63 y.o. female with medical history significant for hypertension, type 2 diabetes mellitus, history of DVT, PAF on Eliquis , chronic HFpEF, and chronic low back pain who presents with worsening bilateral lower extremity swelling, shortness of breath, and back pain.  Patient is admitted to the hospitalist service for further management evaluation of heart failure exacerbation, A-fib RVR, severe back pain. Patient found to have L1-L2 fracture on x-ray, neurosurgery consulted.  Cardiology consulted for CHF exacerbation, A-fib with RVR.  Patient is started on IV Lasix , beta-blocker therapy and Eliquis .  She seems to be noncompliant with her medications.   Assessment and Plan: Persistent A-fib with RVR: HR elevated even with mild exertion, getting out of bed. Cardiology consult appreciated. Patient was on Toprol -XL although patient wanted to take higher dose of metoprolol . Currently on metoprolol  tartrate 50 mg 4 times daily. Cardizem  60 mg twice daily added due to uncontrolled RVR. Currently on Eliquis  as well. Monitor on telemetry   Acute on chronic diastolic heart failure: Continue IV diuresis. Recent EF 60-65%. Monitor daily weights, strict input and output. Fluid/salt restriction advised.  CHF education provided.   Hypokalemia Hypomagnesemia. Replaced.   L1 compression fracture- H/o kyphoplasty 02/2023 MRI lumbar spine reviewed shows interval development of worsening central spinal canal stenosis at L1 secondary to compression fracture, interval development of superior endplate compression deformity of L2. Seen by neurosurgery-advised conservative management due to her morbid obesity, anatomy, cardiac risk factors, she is not a surgical candidate. Continue pain control with IV  Dilaudid , Toradol , Robaxin .  Patient wishes to take her home dose Celebrex .  PT OT evaluation, activity as tolerated.   Morbid obesity Body mass index is 46.91 kg/m.  Complicating further her current condition. She understands to lose weight. Diet, exercise and weight reduction advised.  Type 2 diabetes mellitus.  Well-controlled without long-term insulin  use without complication. Hemoglobin A1c 6.2.  In the past hemoglobin A1c has been at 7.6. On glipizide 2.5 mg daily.  Also on Jardiance . Currently on sliding scale insulin .    Subjective: No nausea no vomiting no fever no chills.  IV infiltrated in the right upper extremity.  No other acute complaint.  Constipation resolved.  Physical Exam: General: in Mild distress, No Rash Cardiovascular: S1 and S2 Present, No Murmur Respiratory: Good respiratory effort, Bilateral Air entry present. No Crackles, No wheezes Abdomen: Bowel Sound present, No tenderness Extremities: Mild swelling of right upper extremity edema, unchanged bilateral lower extremity edema Neuro: Alert and oriented x3, no new focal deficit  Data Reviewed: I have Reviewed nursing notes, Vitals, and Lab results. Since last encounter, pertinent lab results CBC and BMP   . I have ordered test including BMP  .   Disposition: Status is: Inpatient Remains inpatient appropriate because: Monitor for improvement volume overload apixaban  (ELIQUIS ) tablet 5 mg   Family Communication: No one at bedside. Level of care: Telemetry Cardiac   Vitals:   10/05/23 2321 10/06/23 0414 10/06/23 0447 10/06/23 0740  BP: 135/88   (!) 154/94  Pulse: 88   100  Resp: 18 19  18   Temp: 98.1 F (36.7 C) 97.9 F (36.6 C)  97.8 F (36.6 C)  TempSrc: Oral Oral  Oral  SpO2: 92%   93%  Weight:   (!) 148.3 kg   Height:   5' 10 (1.778 m)  Author: Yetta Blanch, MD 10/06/2023 12:24 PM  Please look on [email or web address] to find out who is on call.

## 2023-10-06 NOTE — Plan of Care (Signed)
  Problem: Coping: Goal: Ability to adjust to condition or change in health will improve Outcome: Progressing   Problem: Skin Integrity: Goal: Risk for impaired skin integrity will decrease Outcome: Progressing   Problem: Tissue Perfusion: Goal: Adequacy of tissue perfusion will improve Outcome: Progressing   Problem: Clinical Measurements: Goal: Respiratory complications will improve Outcome: Progressing Goal: Cardiovascular complication will be avoided Outcome: Progressing   Problem: Elimination: Goal: Will not experience complications related to urinary retention Outcome: Progressing   Problem: Safety: Goal: Ability to remain free from injury will improve Outcome: Progressing   Problem: Cardiac: Goal: Ability to achieve and maintain adequate cardiopulmonary perfusion will improve Outcome: Progressing

## 2023-10-06 NOTE — Progress Notes (Signed)
 IVPB magnesium  sulfate given, w/ patient complaint of extreme burning above PIV site. Magnesium  sulfate stopped, IV flushed.   Contacted pharmacy to discuss possible infiltration. Discussed with Deward, pharmacist, and instructed to continue to monitor site and apply cold compress. Ice pack applied. IV consult placed, will restart magnesium  sulfate with new access site.

## 2023-10-07 DIAGNOSIS — I4891 Unspecified atrial fibrillation: Secondary | ICD-10-CM | POA: Diagnosis not present

## 2023-10-07 DIAGNOSIS — I5033 Acute on chronic diastolic (congestive) heart failure: Secondary | ICD-10-CM | POA: Diagnosis not present

## 2023-10-07 LAB — BASIC METABOLIC PANEL WITH GFR
Anion gap: 7 (ref 5–15)
BUN: 10 mg/dL (ref 8–23)
CO2: 32 mmol/L (ref 22–32)
Calcium: 8.5 mg/dL — ABNORMAL LOW (ref 8.9–10.3)
Chloride: 99 mmol/L (ref 98–111)
Creatinine, Ser: 0.67 mg/dL (ref 0.44–1.00)
GFR, Estimated: >60 mL/min (ref >60)
Glucose, Bld: 157 mg/dL — ABNORMAL HIGH (ref 70–99)
Potassium: 3.4 mmol/L — ABNORMAL LOW (ref 3.5–5.1)
Sodium: 138 mmol/L (ref 135–145)

## 2023-10-07 LAB — GLUCOSE, CAPILLARY
Glucose-Capillary: 154 mg/dL — ABNORMAL HIGH (ref 70–99)
Glucose-Capillary: 202 mg/dL — ABNORMAL HIGH (ref 70–99)
Glucose-Capillary: 158 mg/dL — ABNORMAL HIGH (ref 70–99)
Glucose-Capillary: 175 mg/dL — ABNORMAL HIGH (ref 70–99)

## 2023-10-07 LAB — MAGNESIUM: Magnesium: 1.6 mg/dL — ABNORMAL LOW (ref 1.7–2.4)

## 2023-10-07 MED ORDER — MAGNESIUM SULFATE 2 GM/50ML IV SOLN
2.0000 g | Freq: Once | INTRAVENOUS | Status: AC
  Administered 2023-10-07: 2 g via INTRAVENOUS
  Filled 2023-10-07: qty 50

## 2023-10-07 MED ORDER — INSULIN ASPART 100 UNIT/ML IJ SOLN
0.0000 [IU] | Freq: Every day | INTRAMUSCULAR | Status: DC
  Administered 2023-10-13 – 2023-10-14 (×2): 4 [IU] via SUBCUTANEOUS

## 2023-10-07 MED ORDER — METOPROLOL TARTRATE 100 MG PO TABS
100.0000 mg | ORAL_TABLET | Freq: Two times a day (BID) | ORAL | Status: DC
  Administered 2023-10-07 – 2023-10-17 (×20): 100 mg via ORAL
  Filled 2023-10-07 (×20): qty 1

## 2023-10-07 MED ORDER — POTASSIUM CHLORIDE CRYS ER 20 MEQ PO TBCR
40.0000 meq | EXTENDED_RELEASE_TABLET | Freq: Three times a day (TID) | ORAL | Status: DC
  Administered 2023-10-07 – 2023-10-14 (×20): 40 meq via ORAL
  Filled 2023-10-07 (×20): qty 2

## 2023-10-07 MED ORDER — POTASSIUM CHLORIDE CRYS ER 20 MEQ PO TBCR
40.0000 meq | EXTENDED_RELEASE_TABLET | ORAL | Status: AC
  Administered 2023-10-07 (×2): 40 meq via ORAL
  Filled 2023-10-07 (×2): qty 2

## 2023-10-07 MED ORDER — POTASSIUM CHLORIDE CRYS ER 20 MEQ PO TBCR
20.0000 meq | EXTENDED_RELEASE_TABLET | Freq: Two times a day (BID) | ORAL | Status: DC
Start: 2023-10-07 — End: 2023-10-07
  Administered 2023-10-07: 20 meq via ORAL
  Filled 2023-10-07: qty 1

## 2023-10-07 MED ORDER — INSULIN ASPART 100 UNIT/ML IJ SOLN
0.0000 [IU] | Freq: Three times a day (TID) | INTRAMUSCULAR | Status: DC
  Administered 2023-10-08 – 2023-10-09 (×5): 3 [IU] via SUBCUTANEOUS
  Administered 2023-10-09: 5 [IU] via SUBCUTANEOUS
  Administered 2023-10-10 (×2): 3 [IU] via SUBCUTANEOUS
  Administered 2023-10-10: 2 [IU] via SUBCUTANEOUS
  Administered 2023-10-11: 5 [IU] via SUBCUTANEOUS
  Administered 2023-10-11 – 2023-10-12 (×3): 3 [IU] via SUBCUTANEOUS
  Administered 2023-10-12 (×2): 5 [IU] via SUBCUTANEOUS
  Administered 2023-10-13: 3 [IU] via SUBCUTANEOUS
  Administered 2023-10-13: 5 [IU] via SUBCUTANEOUS
  Administered 2023-10-13: 3 [IU] via SUBCUTANEOUS
  Administered 2023-10-14 (×2): 8 [IU] via SUBCUTANEOUS
  Administered 2023-10-14: 5 [IU] via SUBCUTANEOUS
  Administered 2023-10-15: 2 [IU] via SUBCUTANEOUS
  Administered 2023-10-15: 8 [IU] via SUBCUTANEOUS
  Administered 2023-10-15: 5 [IU] via SUBCUTANEOUS
  Administered 2023-10-16: 3 [IU] via SUBCUTANEOUS
  Administered 2023-10-16: 5 [IU] via SUBCUTANEOUS
  Administered 2023-10-16 – 2023-10-17 (×2): 3 [IU] via SUBCUTANEOUS
  Administered 2023-10-17: 5 [IU] via SUBCUTANEOUS

## 2023-10-07 NOTE — Progress Notes (Signed)
 Physical Therapy Treatment Patient Details Name: Tracie [name] MRN: [id number] DOB: 25-Nov-1960 Today's Date: 10/07/2023   History of Present Illness Pt is a 63 y.o female admitted 6/25 for bil leg swelling and back pain. Chest x-ray showed cardiomegaly and vascular congestion. MRI showed L1-L2 fxs & mod spinal stenosis. Neurosurgery recommended conservative management. PMH: Kyphoplasty Nov 2024, DVTs, A-fib, DM    PT Comments  Pt making good progress with activity tolerance this session. She completed a series of 5 sit-stand transfers from EOB with minA to CGA and increased use of momentum to rise. Pt presents with poor LE power, strength, and stability with all transfers. With seated rest break during ambulation, pt able to maintain HR at 125bpm max, recovers to 90s with seated rest. Continues to make progress, but needing more skilled PT to progress functional strength, power, endurance, and stability prior to return home with spouse support.     If plan is discharge home, recommend the following: A little help with walking and/or transfers;A little help with bathing/dressing/bathroom;Assistance with cooking/housework;Assist for transportation;Help with stairs or ramp for entrance;Direct supervision/assist for financial management   Can travel by private vehicle     Yes  Equipment Recommendations  None recommended by PT;Other (comment) (pt has needed DME)    Recommendations for Other Services       Precautions / Restrictions Precautions Precautions: Other (comment);Back Precaution Booklet Issued: No Recall of Precautions/Restrictions: Impaired Precaution/Restrictions Comments: low back pain with L1-L2 compression fractures; monitor HR with activity Restrictions Weight Bearing Restrictions Per Provider Order: No     Mobility  Bed Mobility Overal bed mobility: Needs Assistance Bed Mobility: Supine to Sit     Supine to sit: Supervision, HOB elevated, Used rails      General bed mobility comments: discussed log roll for back precautions, pt completing without assist    Transfers Overall transfer level: Needs assistance Equipment used: Rolling walker (2 wheels) Transfers: Sit to/from Stand, Bed to chair/wheelchair/BSC Sit to Stand: Min assist, Contact guard assist   Step pivot transfers: Contact guard assist       General transfer comment: increased use of momentum to rise, dependent on BUE support. progressed to CGA within session    Ambulation/Gait Ambulation/Gait assistance: Contact guard assist Gait Distance (Feet): 40 Feet (+ 50 ft) Assistive device: Rolling walker (2 wheels) Gait Pattern/deviations: Step-through pattern, Decreased step length - right, Decreased step length - left, Decreased stride length, Wide base of support, Decreased weight shift to right, Decreased weight shift to left Gait velocity: decreased Gait velocity interpretation: <1.8 ft/sec, indicate of risk for recurrent falls   General Gait Details: increased sway, depedent on cues for proximity to RW. HR max 125bpm, chair rest at turning point to check HR. pt without LOB. poor movement of RW   Stairs             Wheelchair Mobility     Tilt Bed    Modified Rankin (Stroke Patients Only)       Balance Overall balance assessment: Needs assistance Sitting-balance support: Feet supported, No upper extremity supported Sitting balance-Leahy Scale: Fair     Standing balance support: Reliant on assistive device for balance, During functional activity, Bilateral upper extremity supported Standing balance-Leahy Scale: Poor Standing balance comment: Requires support of RW for ambulation                            Communication Communication Communication: No apparent difficulties  Cognition  Arousal: Alert Behavior During Therapy: WFL for tasks assessed/performed   PT - Cognitive impairments: Safety/Judgement                          Following commands: Intact      Cueing Cueing Techniques: Verbal cues  Exercises General Exercises - Lower Extremity Long Arc Quad: AROM, Both, 10 reps, Seated Hip Flexion/Marching: AROM, Both, 10 reps, Seated Heel Raises: AROM, Both, 10 reps, Seated Other Exercises Other Exercises: 5x sit-stand from EOB    General Comments General comments (skin integrity, edema, etc.): HR to 125bpm, recovers to 90s with seated rest.      Pertinent Vitals/Pain Pain Assessment Pain Assessment: 0-10 Pain Score: 7  Faces Pain Scale: Hurts even more Pain Location: back Pain Descriptors / Indicators: Pressure Pain Intervention(s): Limited activity within patient's tolerance, Monitored during session, Repositioned     PT Goals (current goals can now be found in the care plan section) Acute Rehab PT Goals Patient Stated Goal: able to do more and be independent PT Goal Formulation: With patient Time For Goal Achievement: 10/18/23 Potential to Achieve Goals: Fair Progress towards PT goals: Progressing toward goals    Frequency    Min 3X/week       AM-PAC PT 6 Clicks Mobility   Outcome Measure  Help needed turning from your back to your side while in a flat bed without using bedrails?: A Little Help needed moving from lying on your back to sitting on the side of a flat bed without using bedrails?: A Little Help needed moving to and from a bed to a chair (including a wheelchair)?: A Little Help needed standing up from a chair using your arms (e.g., wheelchair or bedside chair)?: A Little Help needed to walk in hospital room?: A Little Help needed climbing 3-5 steps with a railing? : A Lot 6 Click Score: 17    End of Session Equipment Utilized During Treatment: Gait belt Activity Tolerance: Patient tolerated treatment well Patient left: with call bell/phone within reach;with family/visitor present;in chair Nurse Communication: Mobility status;Other (comment) (HR better with exertion  today) PT Visit Diagnosis: Unsteadiness on feet (R26.81);Other abnormalities of gait and mobility (R26.89);Difficulty in walking, not elsewhere classified (R26.2);Pain Pain - Right/Left: Right Pain - part of body: Leg (back)     Time: 8945-8874 PT Time Calculation (min) (ACUTE ONLY): 31 min  Charges:    $Therapeutic Exercise: 23-37 mins PT General Charges $$ ACUTE PT VISIT: 1 Visit                     Izetta Call, PT, DPT   Acute Rehabilitation Department Office [phone] Secure Chat Communication Preferred   Izetta JULIANNA Call 10/07/2023, 11:34 AM

## 2023-10-07 NOTE — Plan of Care (Signed)
  Problem: Coping: Goal: Ability to adjust to condition or change in health will improve Outcome: Progressing   Problem: Fluid Volume: Goal: Ability to maintain a balanced intake and output will improve Outcome: Progressing   Problem: Tissue Perfusion: Goal: Adequacy of tissue perfusion will improve Outcome: Progressing   Problem: Clinical Measurements: Goal: Will remain free from infection Outcome: Progressing Goal: Respiratory complications will improve Outcome: Progressing Goal: Cardiovascular complication will be avoided Outcome: Progressing   Problem: Activity: Goal: Risk for activity intolerance will decrease Outcome: Progressing   Problem: Nutrition: Goal: Adequate nutrition will be maintained Outcome: Progressing   Problem: Coping: Goal: Level of anxiety will decrease Outcome: Progressing   Problem: Elimination: Goal: Will not experience complications related to bowel motility Outcome: Progressing Goal: Will not experience complications related to urinary retention Outcome: Progressing   Problem: Safety: Goal: Ability to remain free from injury will improve Outcome: Progressing

## 2023-10-07 NOTE — Progress Notes (Signed)
 Rounding Note   Patient Name: Tracie [name] Date of Encounter: 10/07/2023  [facility] Cardiologist: Ozell Fell, MD   Subjective BP 137/90.  Potassium 3.4, magnesium  1.6.  Net -2.5 L yesterday, -8.1 L on admission.  Creatinine stable at 0.7.  Reports dyspnea improving.  Scheduled Meds:  apixaban   5 mg Oral BID   celecoxib   200 mg Oral Daily   diltiazem   60 mg Oral BID   furosemide   60 mg Intravenous BID   insulin  aspart  0-5 Units Subcutaneous QHS   insulin  aspart  0-6 Units Subcutaneous TID WC   lidocaine   1 patch Transdermal Daily   losartan   100 mg Oral Daily   methocarbamol   500 mg Oral TID   metoprolol  tartrate  50 mg Oral QID   pantoprazole   40 mg Oral Daily   potassium chloride   40 mEq Oral Q2H   potassium chloride   40 mEq Oral TID   sodium chloride  flush  3 mL Intravenous Q12H   Continuous Infusions:  magnesium  sulfate bolus IVPB     PRN Meds: acetaminophen  **OR** acetaminophen , HYDROmorphone  (DILAUDID ) injection, ondansetron  **OR** ondansetron  (ZOFRAN ) IV, mouth rinse, oxyCODONE , polyethylene glycol   Vital Signs  Vitals:   10/06/23 2336 10/07/23 0350 10/07/23 0354 10/07/23 0730  BP: (!) 140/92 (!) 160/85  (!) 137/90  Pulse: 81 95  95  Resp: 19 20  18   Temp: 97.7 F (36.5 C) 97.6 F (36.4 C)  97.6 F (36.4 C)  TempSrc: Oral Oral  Oral  SpO2: 96% 93%  95%  Weight:   (!) 146 kg   Height:   5' 10 (1.778 m)     Intake/Output Summary (Last 24 hours) at 10/07/2023 0936 Last data filed at 10/07/2023 0844 Gross per 24 hour  Intake 1140 ml  Output 2700 ml  Net -1560 ml      10/07/2023    3:54 AM 10/06/2023    4:47 AM 10/05/2023    5:06 AM  Last 3 Weights  Weight (lbs) 321 lb 14 oz 326 lb 15.1 oz 328 lb 4.2 oz  Weight (kg) 146 kg 148.3 kg 148.9 kg      Telemetry Afib rates 90s to 100s primarily but did have RVR up to 170s- Personally Reviewed  ECG  N/a - Personally Reviewed  Physical Exam  GEN: No acute distress.   Neck: No  JVD appreciated but difficult to evaluate given habitus Cardiac: irreg Respiratory: Clear to auscultation bilaterally. GI: Soft, nontender, non-distended  MS: Trace bilateral LE edema Neuro:  Nonfocal  Psych: Normal affect   Labs High Sensitivity Troponin:  No results for input(s): TROPONINIHS in the last 720 hours.   Chemistry Recent Labs  Lab 10/02/23 1711 10/03/23 0523 10/04/23 0246 10/05/23 0309 10/06/23 0438 10/07/23 0327  NA 140 139   < > 137 135 138  K 3.4* 2.9*   < > 3.6 3.2* 3.4*  CL 107 107   < > 103 100 99  CO2 25 21*   < > 23 27 32  GLUCOSE 143* 160*   < > 138* 150* 157*  BUN 11 10   < > 14 11 10   CREATININE 0.70 0.59   < > 0.74 0.63 0.67  CALCIUM 9.0 8.6*   < > 8.5* 8.2* 8.5*  MG  --  1.5*  --   --  1.3* 1.6*  PROT 8.6*  --   --   --   --   --   ALBUMIN 3.6  --   --   --   --   --  AST 19  --   --   --   --   --   ALT 14  --   --   --   --   --   ALKPHOS 60  --   --   --   --   --   BILITOT 0.9  --   --   --   --   --   GFRNONAA >60 >60   < > >60 >60 >60  ANIONGAP 8 11   < > 11 8 7    < > = values in this interval not displayed.    Lipids No results for input(s): CHOL, TRIG, HDL, LABVLDL, LDLCALC, CHOLHDL in the last 168 hours.  Hematology Recent Labs  Lab 10/04/23 0246 10/05/23 0309 10/06/23 0438  WBC 10.7* 10.9* 10.0  RBC 5.02 5.10 4.90  HGB 13.7 14.0 13.7  HCT 44.9 45.5 43.5  MCV 89.4 89.2 88.8  MCH 27.3 27.5 28.0  MCHC 30.5 30.8 31.5  RDW 14.4 14.1 13.9  PLT 270 251 246   Thyroid No results for input(s): TSH, FREET4 in the last 168 hours.  BNP Recent Labs  Lab 10/02/23 1730  BNP 189.8*    DDimer No results for input(s): DDIMER in the last 168 hours.   Radiology  No results found.    Patient Profile   63 year old here with atrial fibrillation rapid ventricular response, hypokalemia, suspected diastolic heart failure exacerbation hypertension type 2 diabetes. Came in with chief complaint of leg swelling shortness  of breath and low back pain.   Assessment & Plan  Afib with RVR: History of A-fib since 2021, suspect permanent A-fib at this point.  She has declined cardioversion. -Continue Eliquis  5 mg twice daily -Continue diltiazem  60 mg twice daily and metoprolol  50 mg 4 times daily    Acute on chronic diastolic HF: 09/2023 echo: LVEF 60-65%, indet diastolic, normal RV. CXR vascular congestion, BNP 190 - on IV lasix  60mg  bid, diuresing well.  Would continue IV diuresis today - resume home SLGT2i closer to discharge.  - Replete potassium, magnesium  for goal K greater than 4, mag greater than 2.    For questions or updates, please contact [facility] Please consult [email or web address] for contact info under     Signed, Lonni LITTIE Nanas, MD  10/07/2023, 9:36 AM

## 2023-10-07 NOTE — Progress Notes (Signed)
 [facility] Progress Note Patient: Tracie [name] FMW:[id number] DOB: 1961/03/09 DOA: 10/02/2023  DOS: the patient was seen and examined on 10/07/2023  Brief Hospital Course: Tracie [name] is a 63 y.o. female with medical history significant for hypertension, type 2 diabetes mellitus, history of DVT, PAF on Eliquis , chronic HFpEF, and chronic low back pain who presents with worsening bilateral lower extremity swelling, shortness of breath, and back pain.  Patient is admitted to the hospitalist service for further management evaluation of heart failure exacerbation, A-fib RVR, severe back pain. Patient found to have L1-L2 fracture on x-ray, neurosurgery consulted.  Cardiology consulted for CHF exacerbation, A-fib with RVR.  Patient is started on IV Lasix , beta-blocker therapy and Eliquis .  She seems to be noncompliant with her medications.   Assessment and Plan: Persistent A-fib with RVR: HR elevated even with mild exertion, getting out of bed. Cardiology consult appreciated. Patient was on Toprol -XL although patient wanted to take higher dose of metoprolol . Currently on metoprolol  tartrate 50 mg 4 times daily.  Cardiologist changed to 100 mg twice daily.  In the past patient has refused a higher dose of 100 mg.  Will monitor. Cardizem  60 mg twice daily added due to uncontrolled RVR. Currently on Eliquis  as well. Monitor on telemetry   Acute on chronic diastolic heart failure: Continue IV diuresis. Recent EF 60-65%. Monitor daily weights, strict input and output. Fluid/salt restriction advised.  CHF education provided.   Hypokalemia Hypomagnesemia. Replaced.   L1 compression fracture- H/o kyphoplasty 02/2023 Chronic osteoporosis as well as arthritis. MRI lumbar spine reviewed shows interval development of worsening central spinal canal stenosis at L1 secondary to compression fracture, interval development of superior endplate compression deformity of L2. Seen by  neurosurgery-advised conservative management due to her morbid obesity, anatomy, cardiac risk factors, she is not a surgical candidate. Continue pain control with IV Dilaudid , Toradol , Robaxin .  Patient wishes to take her home dose Celebrex .  PT OT evaluation, activity as tolerated. Recommend outpatient follow-up with orthopedics as well for arthritis management.   Morbid obesity Body mass index is 46.91 kg/m.  Complicating further her current condition. She understands to lose weight. Diet, exercise and weight reduction advised. Concern for obesity hypoventilation syndrome is also high.  Recommended patient to have outpatient sleep study for further evaluation.  Type 2 diabetes mellitus.  Well-controlled without long-term insulin  use without complication. Hemoglobin A1c 6.2.  In the past hemoglobin A1c has been at 7.6. On glipizide 2.5 mg daily.  Also on Jardiance . Currently on sliding scale insulin .  Changed the scale from very sensitive to moderate.   Subjective: Continues to have pain 7 out of 10 in her back.  No nausea no vomiting.  No BM today.  Passing gas.  Had a BM yesterday.  Oral intake adequate.  Physical Exam: General: in Mild distress, No Rash Cardiovascular: S1 and S2 Present, No Murmur Respiratory: Good respiratory effort, Bilateral Air entry present. No Crackles, No wheezes Abdomen: Bowel Sound present, No tenderness Extremities: bilateral  edema Neuro: Alert and oriented x3, no new focal deficit  Data Reviewed: I have Reviewed nursing notes, Vitals, and Lab results. Since last encounter, pertinent lab results magnesium  and BMP   . I have ordered test including magnesium  and BMP  .  Disposition: Status is: Inpatient Remains inpatient appropriate because: Monitor for improvement in volume overload. apixaban  (ELIQUIS ) tablet 5 mg   Family Communication: Family at bedside Level of care: Telemetry Cardiac  Vitals:   10/07/23 0730 10/07/23 1130 10/07/23  1540 10/07/23  1604  BP: (!) 137/90 102/74 (!) 142/95 (!) 142/95  Pulse: 95 82 80 90  Resp: 18 18 18    Temp: 97.6 F (36.4 C) 97.6 F (36.4 C) 97.6 F (36.4 C)   TempSrc: Oral Oral Oral   SpO2: 95% 95% 90%   Weight:      Height:         Author: Yetta Blanch, MD 10/07/2023 5:22 PM  Please look on [email or web address] to find out who is on call.

## 2023-10-08 DIAGNOSIS — I4821 Permanent atrial fibrillation: Secondary | ICD-10-CM | POA: Diagnosis not present

## 2023-10-08 DIAGNOSIS — I5033 Acute on chronic diastolic (congestive) heart failure: Secondary | ICD-10-CM | POA: Diagnosis not present

## 2023-10-08 LAB — GLUCOSE, CAPILLARY
Glucose-Capillary: 167 mg/dL — ABNORMAL HIGH (ref 70–99)
Glucose-Capillary: 196 mg/dL — ABNORMAL HIGH (ref 70–99)
Glucose-Capillary: 165 mg/dL — ABNORMAL HIGH (ref 70–99)
Glucose-Capillary: 184 mg/dL — ABNORMAL HIGH (ref 70–99)

## 2023-10-08 LAB — BASIC METABOLIC PANEL WITH GFR
Anion gap: 7 (ref 5–15)
BUN: 14 mg/dL (ref 8–23)
CO2: 31 mmol/L (ref 22–32)
Calcium: 8.6 mg/dL — ABNORMAL LOW (ref 8.9–10.3)
Chloride: 100 mmol/L (ref 98–111)
Creatinine, Ser: 0.7 mg/dL (ref 0.44–1.00)
GFR, Estimated: >60 mL/min (ref >60)
Glucose, Bld: 174 mg/dL — ABNORMAL HIGH (ref 70–99)
Potassium: 3.8 mmol/L (ref 3.5–5.1)
Sodium: 138 mmol/L (ref 135–145)

## 2023-10-08 LAB — MAGNESIUM: Magnesium: 1.6 mg/dL — ABNORMAL LOW (ref 1.7–2.4)

## 2023-10-08 MED ORDER — MAGNESIUM SULFATE 4 GM/100ML IV SOLN
4.0000 g | Freq: Once | INTRAVENOUS | Status: AC
  Administered 2023-10-08: 4 g via INTRAVENOUS
  Filled 2023-10-08: qty 100

## 2023-10-08 MED ORDER — DILTIAZEM HCL ER COATED BEADS 120 MG PO CP24
120.0000 mg | ORAL_CAPSULE | Freq: Every day | ORAL | Status: DC
  Administered 2023-10-08 – 2023-10-11 (×4): 120 mg via ORAL
  Filled 2023-10-08 (×4): qty 1

## 2023-10-08 MED ORDER — FUROSEMIDE 10 MG/ML IJ SOLN
60.0000 mg | Freq: Two times a day (BID) | INTRAMUSCULAR | Status: DC
  Administered 2023-10-08 – 2023-10-10 (×6): 60 mg via INTRAVENOUS
  Filled 2023-10-08 (×7): qty 6

## 2023-10-08 NOTE — Plan of Care (Signed)
  Problem: Education: Goal: Ability to describe self-care measures that may prevent or decrease complications (Diabetes Survival Skills Education) will improve Outcome: Not Progressing Goal: Individualized Educational Video(s) Outcome: Not Progressing   Problem: Coping: Goal: Ability to adjust to condition or change in health will improve Outcome: Not Progressing   Problem: Fluid Volume: Goal: Ability to maintain a balanced intake and output will improve Outcome: Not Progressing   Problem: Health Behavior/Discharge Planning: Goal: Ability to identify and utilize available resources and services will improve Outcome: Not Progressing Goal: Ability to manage health-related needs will improve Outcome: Not Progressing   Problem: Metabolic: Goal: Ability to maintain appropriate glucose levels will improve Outcome: Not Progressing   Problem: Nutritional: Goal: Maintenance of adequate nutrition will improve Outcome: Not Progressing Goal: Progress toward achieving an optimal weight will improve Outcome: Not Progressing   Problem: Skin Integrity: Goal: Risk for impaired skin integrity will decrease Outcome: Not Progressing   Problem: Tissue Perfusion: Goal: Adequacy of tissue perfusion will improve Outcome: Not Progressing   Problem: Education: Goal: Knowledge of General Education information will improve Description: Including pain rating scale, medication(s)/side effects and non-pharmacologic comfort measures Outcome: Not Progressing   Problem: Health Behavior/Discharge Planning: Goal: Ability to manage health-related needs will improve Outcome: Not Progressing   Problem: Clinical Measurements: Goal: Ability to maintain clinical measurements within normal limits will improve Outcome: Not Progressing Goal: Will remain free from infection Outcome: Not Progressing Goal: Diagnostic test results will improve Outcome: Not Progressing Goal: Respiratory complications will  improve Outcome: Not Progressing Goal: Cardiovascular complication will be avoided Outcome: Not Progressing   Problem: Activity: Goal: Risk for activity intolerance will decrease Outcome: Not Progressing   Problem: Nutrition: Goal: Adequate nutrition will be maintained Outcome: Not Progressing   Problem: Coping: Goal: Level of anxiety will decrease Outcome: Not Progressing   Problem: Elimination: Goal: Will not experience complications related to bowel motility Outcome: Not Progressing Goal: Will not experience complications related to urinary retention Outcome: Not Progressing   Problem: Pain Managment: Goal: General experience of comfort will improve and/or be controlled Outcome: Not Progressing   Problem: Safety: Goal: Ability to remain free from injury will improve Outcome: Not Progressing   Problem: Skin Integrity: Goal: Risk for impaired skin integrity will decrease Outcome: Not Progressing   Problem: Education: Goal: Ability to demonstrate management of disease process will improve Outcome: Not Progressing Goal: Ability to verbalize understanding of medication therapies will improve Outcome: Not Progressing Goal: Individualized Educational Video(s) Outcome: Not Progressing   Problem: Activity: Goal: Capacity to carry out activities will improve Outcome: Not Progressing   Problem: Cardiac: Goal: Ability to achieve and maintain adequate cardiopulmonary perfusion will improve Outcome: Not Progressing

## 2023-10-08 NOTE — Progress Notes (Signed)
 Occupational Therapy Treatment Patient Details Name: Tracie [name] MRN: [id number] DOB: 10-21-1960 Today's Date: 10/08/2023   History of present illness Pt is a 63 y.o female admitted 6/25 for bil leg swelling and back pain. Chest x-ray showed cardiomegaly and vascular congestion. Acute on chronic HFpEF. MRI showed L1-L2 fxs & mod spinal stenosis. Neurosurgery recommended conservative management. PMH: Kyphoplasty Nov 2024, DVTs, A-fib, DM   OT comments  Pt making excellent progress. Able to complete mobility with S at household distances in addition to min A with LB ADL tasks. Pt states her back feels much better in addition to her breathing. Pt able to ambulate around her room, transfer to her BSC, complete pericare then grooming at sink level with max HR 125 with 1/4 DOE. Husband asking about when his wife will be able to sleep in the bed at home. Pt has been sleeping in her chair since 02/2023 and this is a goal the pt can work toward. Pt's knowledge regarding management of her CHF is poor and she would benefit from further education on disease management including warning signs/symptoms of CHF exacerbation. Given progress pt has made and AmPac score of 19, recommend HHOT for follow up. Acute Ot will continue to follow.       If plan is discharge home, recommend the following:  A little help with walking and/or transfers;A little help with bathing/dressing/bathroom;Assistance with cooking/housework;Assist for transportation   Equipment Recommendations  Tub/shower bench;Other (comment) (bariatric)    Recommendations for Other Services      Precautions / Restrictions Precautions Recall of Precautions/Restrictions: Impaired (regarding CHF) Restrictions Weight Bearing Restrictions Per Provider Order: No       Mobility Bed Mobility Overal bed mobility: Needs Assistance Bed Mobility: Supine to Sit, Sit to Supine   Sidelying to sit: Supervision   Sit to supine: Contact guard  assist   General bed mobility comments: Pt sleeps in a chair at home since Nov 2024; states she has bedrails at home    Transfers Overall transfer level: Needs assistance Equipment used: Rolling walker (2 wheels)   Sit to Stand: Supervision                 Balance Overall balance assessment: Needs assistance   Sitting balance-Leahy Scale: Good       Standing balance-Leahy Scale: Poor Standing balance comment: reliant on RW                           ADL either performed or assessed with clinical judgement   ADL Overall ADL's : Needs assistance/impaired Eating/Feeding: Independent   Grooming: Set up;Standing   Upper Body Bathing: Set up;Sitting   Lower Body Bathing: Minimal assistance;Sit to/from stand   Upper Body Dressing : Set up;Sitting   Lower Body Dressing: Minimal assistance;Sit to/from stand   Toilet Transfer: Contact guard assist;Ambulation;BSC/3in1   Toileting- Clothing Manipulation and Hygiene: Supervision/safety;Sitting/lateral lean;Sit to/from stand       Functional mobility during ADLs: Contact guard assist;Rolling walker (2 wheels)      Extremity/Trunk Assessment Upper Extremity Assessment Upper Extremity Assessment: Overall WFL for tasks assessed   Lower Extremity Assessment Lower Extremity Assessment: Defer to PT evaluation        Vision       Perception     Praxis     Communication     Cognition Arousal: Alert Behavior During Therapy: [facility] for tasks assessed/performed Cognition: History of cognitive impairments  Cueing      Exercises Exercises: Other exercises Other Exercises Other Exercises: pursed lip breathing    Shoulder Instructions       General Comments increased boday habitus; began education regarding warning signs/symptoms of CHF - pt & husband appear interested in learning more information    Pertinent Vitals/ Pain       Pain  Assessment Pain Assessment: Faces Faces Pain Scale: Hurts a little bit Pain Location: back Pain Descriptors / Indicators: Pressure Pain Intervention(s): Limited activity within patient's tolerance  Home Living                                          Prior Functioning/Environment              Frequency  Min 2X/week        Progress Toward Goals  OT Goals(current goals can now be found in the care plan section)  Progress towards OT goals: Progressing toward goals  Acute Rehab OT Goals Patient Stated Goal: to get the fluid off and feel better OT Goal Formulation: With patient Time For Goal Achievement: 10/18/23 Potential to Achieve Goals: Good ADL Goals Pt Will Perform Grooming: with supervision;standing Pt Will Perform Lower Body Dressing: with supervision;sit to/from stand Pt Will Transfer to Toilet: with supervision;ambulating;bedside commode Pt Will Perform Toileting - Clothing Manipulation and hygiene: with supervision;sit to/from stand  Plan      Co-evaluation                 AM-PAC OT 6 Clicks Daily Activity     Outcome Measure   Help from another person eating meals?: None Help from another person taking care of personal grooming?: A Little Help from another person toileting, which includes using toliet, bedpan, or urinal?: A Little Help from another person bathing (including washing, rinsing, drying)?: A Little Help from another person to put on and taking off regular upper body clothing?: A Little Help from another person to put on and taking off regular lower body clothing?: A Little 6 Click Score: 19    End of Session Equipment Utilized During Treatment: Gait belt;Rolling walker (2 wheels)  OT Visit Diagnosis: Unsteadiness on feet (R26.81);Other abnormalities of gait and mobility (R26.89);Muscle weakness (generalized) (M62.81)   Activity Tolerance Patient tolerated treatment well   Patient Left in bed;with call  bell/phone within reach;with family/visitor present   Nurse Communication Other (comment) (pt would benefit from education on CHF management)        Time: 8596-8564 OT Time Calculation (min): 32 min  Charges: OT General Charges $OT Visit: 1 Visit OT Treatments $Self Care/Home Management : 23-37 mins  Kreg Sink, OT/L   Acute OT Clinical Specialist Acute Rehabilitation Services Pager [phone] Office [phone]   [facility] 10/08/2023, 2:53 PM

## 2023-10-08 NOTE — Progress Notes (Signed)
 [facility] Progress Note Patient: Tracie [name] FMW:[id number] DOB: May 21, 1960 DOA: 10/02/2023  DOS: the patient was seen and examined on 10/08/2023  Brief Hospital Course: Tracie [name] is a 63 y.o. female with medical history significant for hypertension, type 2 diabetes mellitus, history of DVT, PAF on Eliquis , chronic HFpEF, and chronic low back pain who presents with worsening bilateral lower extremity swelling, shortness of breath, and back pain.  Patient is admitted to the hospitalist service for further management evaluation of heart failure exacerbation, A-fib RVR, severe back pain. Patient found to have L1-L2 fracture on x-ray, neurosurgery consulted.  Cardiology consulted for CHF exacerbation, A-fib with RVR.  Patient is started on IV Lasix , beta-blocker therapy and Eliquis .  She seems to be noncompliant with her medications.   Assessment and Plan: Persistent A-fib with RVR: HR elevated even with mild exertion, getting out of bed. Cardiology consult appreciated. Currently on metoprolol  and Cardizem . Also on Eliquis .   Acute on chronic diastolic heart failure: Continue IV diuresis. Recent EF 60-65%. Monitor daily weights, strict input and output. Fluid/salt restriction advised.  CHF education provided.   Hypokalemia Hypomagnesemia. Replaced.   L1 compression fracture- H/o kyphoplasty 02/2023 Chronic osteoporosis as well as arthritis. MRI lumbar spine reviewed shows interval development of worsening central spinal canal stenosis at L1 secondary to compression fracture, interval development of superior endplate compression deformity of L2. Seen by neurosurgery-advised conservative management due to her morbid obesity, anatomy, cardiac risk factors, she is not a surgical candidate. Continue pain control with IV Dilaudid , Toradol , Robaxin .  Patient wishes to take her home dose Celebrex .  PT OT evaluation, activity as tolerated. Recommend outpatient follow-up with  orthopedics as well for arthritis management.   Morbid obesity Body mass index is 46.06 kg/m.  Complicating further her current condition. She understands to lose weight. Diet, exercise and weight reduction advised. Concern for obesity hypoventilation syndrome is also high.   Recommended patient to have outpatient sleep study for further evaluation.  Type 2 diabetes mellitus.  Well-controlled without long-term insulin  use without complication. Hemoglobin A1c 6.2.  In the past hemoglobin A1c has been at 7.6. On glipizide 2.5 mg daily.  Also on Jardiance . Currently on sliding scale insulin  continue moderate scale.   Subjective: No nausea no vomiting.  Pain improving.  No fever no chills.  No diarrhea.  Physical Exam: General: in Mild distress, No Rash Cardiovascular: S1 and S2 Present, No Murmur Respiratory: Good respiratory effort, Bilateral Air entry present. No Crackles, No wheezes Abdomen: Bowel Sound present, No tenderness Extremities: Improving edema Neuro: Alert and oriented x3, no new focal deficit  Data Reviewed: I have Reviewed nursing notes, Vitals, and Lab results. Since last encounter, pertinent lab results CBG and BMP   . I have ordered test including BMP  .   Disposition: Status is: Inpatient Remains inpatient appropriate because: Monitor for improvement in volume overload. apixaban  (ELIQUIS ) tablet 5 mg   Family Communication: Family at bedside Level of care: Telemetry Cardiac   Vitals:   10/08/23 0512 10/08/23 0743 10/08/23 1105 10/08/23 1610  BP:  (!) 141/80 (!) 147/94 128/87  Pulse:  98 84 84  Resp:  20 20 20   Temp:  97.8 F (36.6 C) 97.9 F (36.6 C) 98.4 F (36.9 C)  TempSrc:  Oral Oral Oral  SpO2:  96% 97% 94%  Weight: (!) 145.6 kg     Height:         Author: Yetta Blanch, MD 10/08/2023 5:58 PM  Please look on  [email or web address] to find out who is on call.

## 2023-10-08 NOTE — Care Management Important Message (Signed)
 Important Message  Patient Details  Name: Tracie [name] MRN: [id number] Date of Birth: 07-Oct-1960   Important Message Given:  Yes - Medicare IM     Vonzell Arrie Sharps 10/08/2023, 10:32 AM

## 2023-10-08 NOTE — Progress Notes (Signed)
 Mobility Specialist Progress Note:    10/08/23 1102  Mobility  Activity Ambulated with assistance in hallway  Level of Assistance Minimal assist, patient does 75% or more  Assistive Device Front wheel walker  Distance Ambulated (ft) 150 ft (59ft x3 w/ chair follow to control HR)  Activity Response Tolerated well  Mobility Referral Yes  Mobility visit 1 Mobility  Mobility Specialist Start Time (ACUTE ONLY) 1102  Mobility Specialist Stop Time (ACUTE ONLY) 1119  Mobility Specialist Time Calculation (min) (ACUTE ONLY) 17 min   Pt pleasant and agreeable to session. Pt able to stand on own but slight trouble controlling the walker; moved the walker w/ some slight swaying/lateral movements while ambulating. Pt took 3 breaks to control HR once it reached around 120-130. No c/o symptoms other than slight SOB. Returned pt to recliner with no complaints and all needs met.   Therisa Rana Mobility Specialist Please contact via SecureChat or  Rehab office at [phone]

## 2023-10-08 NOTE — Progress Notes (Addendum)
  Progress Note  Patient Name: Tracie [name] Date of Encounter: 10/08/2023 [facility] Cardiologist: Ozell Fell, MD   Interval Summary   Patient denies any chest pain, shortness of breath, palpitations  HR has been well controlled at rest, rising up with exertion   Vital Signs Vitals:   10/08/23 0005 10/08/23 0311 10/08/23 0512 10/08/23 0743  BP: (!) 147/97 (!) 158/94  (!) 141/80  Pulse: 95 85  98  Resp: 19 19  20   Temp: 97.9 F (36.6 C) 98.1 F (36.7 C)  97.8 F (36.6 C)  TempSrc: Oral Oral  Oral  SpO2: 96% 94%  96%  Weight:   (!) 145.6 kg   Height:       Intake/Output Summary (Last 24 hours) at 10/08/2023 0938 Last data filed at 10/08/2023 0827 Gross per 24 hour  Intake 1580.01 ml  Output 2650 ml  Net -1069.99 ml      10/08/2023    5:12 AM 10/07/2023    3:54 AM 10/06/2023    4:47 AM  Last 3 Weights  Weight (lbs) 320 lb 15.8 oz 321 lb 14 oz 326 lb 15.1 oz  Weight (kg) 145.6 kg 146 kg 148.3 kg     Telemetry/ECG  Atrial fibrillation, HR 90s but up to 120s  - Personally Reviewed  Physical Exam  GEN: No acute distress.   Neck: unable to assess JVD Cardiac: irregularly irregular rhythm,  no murmurs, rubs, or gallops.  Respiratory: mildly decreased at bases. GI: Soft, nontender, non-distended  MS: 1+ LE edema  Assessment & Plan  63 year old here with atrial fibrillation rapid ventricular response, hypokalemia, suspected diastolic heart failure exacerbation hypertension type 2 diabetes. Came in with chief complaint of leg swelling shortness of breath and low back pain   Acute on chronic HFpEF Hypertension  Echo: LVEF 60-65%, normal RV function CXR showed vascular congestion BNP 190 Given IV Lasix   Net -9 L this admission  Weight 320 lb from 331 lb on admission  Most recent BP 141/80 HR 98 Renal function remains stable  Continue losartan  100 mg daily Continue Lopressor  100 mg BID Continue to monitor and replete electrolytes  Continue strict  I&O's, daily weights, daily BMPs/Mag  Atrial fibrillation with RVR  Known history of A. Fib Declined cardioversion Continue Eliquis  5 mg BID Continue diltiazem  120 mg daily Continue Lopressor  100 mg BID   Per primary L1 compression fracture Chronic osteoporosis Diabetes    For questions or updates, please contact [facility] Please consult [email or web address] for contact info under       Signed, Waddell DELENA Donath, PA-C   Patient seen and examined.  Agree with above documentation.  On exam, patient is alert and oriented, irregular rhythm, normal rate, no murmurs, diminished breath sounds, trace edema, difficult to assess JVD given habitus.  Net -1.1 L yesterday, -9.2 L on admission.  Creatinine stable at 0.7.  Telemetry reviewed, remains in A-fib with rates controlled, 80s to 90s.  Consolidated Lopressor  to 100 mg twice daily and diltiazem  120 mg daily.  Continue IV lasix   Lonni LITTIE Nanas, MD

## 2023-10-08 NOTE — TOC CM/SW Note (Addendum)
 Transition of Care [facility]) - Inpatient Brief Assessment   Patient Details  Name: Tracie [name] MRN: [id number] Date of Birth: [birth year]  Transition of Care [facility]) CM/SW Contact:    Waddell Barnie Rama, RN Phone Number: 10/08/2023, 2:57 PM   Clinical Narrative: From home with spouse, has PCP and insurance on file, states has no HH services in place at this time , hsa cane alois at home.  States family member will transport them home at [employer] and family is support system, states gets medications from [facility] on [address].  Pta self ambulatory with walker. Patient gives this NCM permission to speak with spouse.  She eats a low sodium diet.  She has a scale for daily weights.  Spouse would like to know the price of a bariatric tub bench,  Jermaine with [employer] is checking on the price.  NCM offered choice , she has no preference, NCM made referral to Cindie with [facility], she is able to take referral for HHPT, HHOT.  Soc will begin 24 to 48 hrs post dc.   Transition of Care Asessment: Insurance and Status: Insurance coverage has been reviewed Patient has primary care physician: Yes Home environment has been reviewed: lives with spouse Prior level of function:: indep Prior/Current Home Services: Current home services (walker, cane) Social Drivers of Health Review: SDOH reviewed no interventions necessary Readmission risk has been reviewed: Yes Transition of care needs: transition of care needs identified, TOC will continue to follow

## 2023-10-08 NOTE — Progress Notes (Signed)
 OT Cancellation Note  Patient Details Name: Tracie [name] MRN: [id number] DOB: 07/07/1960   Cancelled Treatment:    Reason Eval/Treat Not Completed: Pain limiting ability to participate (Pt asking to wait until her IV has been removed to work with therapy. Nsg asked to notify this therapist once IV issue has been resolved.)  [facility] 10/08/2023, 10:29 AM Kreg Sink, OT/L   Acute OT Clinical Specialist Acute Rehabilitation Services Pager [phone] Office [phone]

## 2023-10-09 DIAGNOSIS — I5033 Acute on chronic diastolic (congestive) heart failure: Secondary | ICD-10-CM | POA: Diagnosis not present

## 2023-10-09 DIAGNOSIS — I482 Chronic atrial fibrillation, unspecified: Secondary | ICD-10-CM | POA: Insufficient documentation

## 2023-10-09 LAB — GLUCOSE, CAPILLARY
Glucose-Capillary: 202 mg/dL — ABNORMAL HIGH (ref 70–99)
Glucose-Capillary: 165 mg/dL — ABNORMAL HIGH (ref 70–99)
Glucose-Capillary: 183 mg/dL — ABNORMAL HIGH (ref 70–99)
Glucose-Capillary: 153 mg/dL — ABNORMAL HIGH (ref 70–99)

## 2023-10-09 LAB — BASIC METABOLIC PANEL WITH GFR
Anion gap: 9 (ref 5–15)
BUN: 15 mg/dL (ref 8–23)
CO2: 30 mmol/L (ref 22–32)
Calcium: 8.7 mg/dL — ABNORMAL LOW (ref 8.9–10.3)
Chloride: 96 mmol/L — ABNORMAL LOW (ref 98–111)
Creatinine, Ser: 0.75 mg/dL (ref 0.44–1.00)
GFR, Estimated: >60 mL/min (ref >60)
Glucose, Bld: 173 mg/dL — ABNORMAL HIGH (ref 70–99)
Potassium: 4.2 mmol/L (ref 3.5–5.1)
Sodium: 135 mmol/L (ref 135–145)

## 2023-10-09 LAB — MAGNESIUM: Magnesium: 1.8 mg/dL (ref 1.7–2.4)

## 2023-10-09 MED ORDER — LOSARTAN POTASSIUM 50 MG PO TABS
50.0000 mg | ORAL_TABLET | Freq: Every day | ORAL | Status: DC
  Administered 2023-10-10 – 2023-10-14 (×5): 50 mg via ORAL
  Filled 2023-10-09 (×5): qty 1

## 2023-10-09 MED ORDER — SPIRONOLACTONE 12.5 MG HALF TABLET
12.5000 mg | ORAL_TABLET | Freq: Every day | ORAL | Status: DC
  Administered 2023-10-09 – 2023-10-17 (×9): 12.5 mg via ORAL
  Filled 2023-10-09 (×9): qty 1

## 2023-10-09 NOTE — Progress Notes (Signed)
 Physical Therapy Treatment Patient Details Name: Tracie [name] MRN: [id number] DOB: [birth year] Today's Date: 10/09/2023   History of Present Illness Pt is a 63 y.o female admitted 6/25 for bil leg swelling and back pain. Chest x-ray showed cardiomegaly and vascular congestion. MRI showed L1-L2 fxs & mod spinal stenosis. Neurosurgery recommended conservative management. Hospital course complicated by persistent afib with RVR, pt declined cardioversion. PMH: Kyphoplasty Nov 2024, DVTs, A-fib, DM II, obesity (BMI 45), and HFpEF.    PT Comments  The pt was agreeable to session, reports good morning so far. She was able to progress ambulation distance well this session, completing 50 ft + 75 ft after seated rest followed by further LE exercises in room. The pt had HR elevation to 131bpm initially with 50 ft gait and to 140bpm with 75 ft gait. Both times, HR to 100s with 1-2 min seated rest. Pt was aware of fatigue and exertion, but with poor insight to capabilities and safe decision making. For example, pt reports fatigue after 50 ft of ambulation, states she is unable to ambulate 20 ft to end of hallway, and instead was planning to walk 50 ft back to her room to sit down despite knowing PT had chair follow. Pt educated at length on energy conservation, listening to her body, and judging exertion and need for rest. Pt will benefit from [facility] for longer distance mobility, but has progressed to a level where she could return home ambulating household distances with assist of family and receive HHPT. Recommendations updated accordingly.     If plan is discharge home, recommend the following: A little help with walking and/or transfers;A little help with bathing/dressing/bathroom;Assistance with cooking/housework;Assist for transportation;Help with stairs or ramp for entrance;Direct supervision/assist for financial management   Can travel by private vehicle     Yes  Equipment Recommendations  Wheelchair  (measurements PT);Wheelchair cushion (measurements PT)    Recommendations for Other Services       Precautions / Restrictions Precautions Precautions: Other (comment);Back Precaution Booklet Issued: No Recall of Precautions/Restrictions: Impaired (regarding CHF management) Precaution/Restrictions Comments: low back pain with L1-L2 compression fractures; monitor HR with activity Restrictions Weight Bearing Restrictions Per Provider Order: No     Mobility  Bed Mobility Overal bed mobility: Needs Assistance             General bed mobility comments: OOB at start and end of session    Transfers Overall transfer level: Needs assistance Equipment used: Rolling walker (2 wheels) Transfers: Sit to/from Stand, Bed to chair/wheelchair/BSC Sit to Stand: Contact guard assist, Supervision   Step pivot transfers: Supervision       General transfer comment: pt using momentum and UE support to power up to standing, completed x8 in session with CGA to supervision. HR to 115 with sit-stand    Ambulation/Gait Ambulation/Gait assistance: Contact guard assist (chair follow) Gait Distance (Feet): 50 Feet (+ 75 ft) Assistive device: Rolling walker (2 wheels) Gait Pattern/deviations: Step-through pattern, Decreased step length - right, Decreased step length - left, Decreased stride length, Wide base of support, Decreased weight shift to right, Decreased weight shift to left Gait velocity: decreased     General Gait Details: increased sway, depedent on cues for proximity to RW. HR max 131bpm initially, to 140bpm with longer walk. chair follow with seated rest after 50 ft, pt with poor insight to fatigue and capabilities.       Balance Overall balance assessment: Needs assistance Sitting-balance support: Feet supported, No upper extremity supported Sitting  balance-Leahy Scale: Fair     Standing balance support: Reliant on assistive device for balance, During functional activity,  Bilateral upper extremity supported Standing balance-Leahy Scale: Poor Standing balance comment: Requires support of RW for ambulation                            Communication Communication Communication: No apparent difficulties  Cognition Arousal: Alert Behavior During Therapy: WFL for tasks assessed/performed   PT - Cognitive impairments: Safety/Judgement                       PT - Cognition Comments: pt following commands well, needing increased cues for self-monitoring of exertion and using seated rest. poor insight to capabilities Following commands: Intact      Cueing Cueing Techniques: Verbal cues  Exercises Other Exercises Other Exercises: 5x sit-stand from recliner with supervision, dependent on UE support and momentum    General Comments        Pertinent Vitals/Pain Pain Assessment Pain Assessment: Faces Faces Pain Scale: Hurts even more Pain Location: back Pain Descriptors / Indicators: Pressure Pain Intervention(s): Limited activity within patient's tolerance, Monitored during session, Repositioned     PT Goals (current goals can now be found in the care plan section) Acute Rehab PT Goals Patient Stated Goal: able to do more and be independent PT Goal Formulation: With patient Time For Goal Achievement: 10/18/23 Potential to Achieve Goals: Fair Progress towards PT goals: Progressing toward goals    Frequency    Min 3X/week       AM-PAC PT 6 Clicks Mobility   Outcome Measure  Help needed turning from your back to your side while in a flat bed without using bedrails?: A Little Help needed moving from lying on your back to sitting on the side of a flat bed without using bedrails?: A Little Help needed moving to and from a bed to a chair (including a wheelchair)?: A Little Help needed standing up from a chair using your arms (e.g., wheelchair or bedside chair)?: A Little Help needed to walk in hospital room?: A Little Help  needed climbing 3-5 steps with a railing? : A Lot 6 Click Score: 17    End of Session Equipment Utilized During Treatment: Gait belt Activity Tolerance: Patient tolerated treatment well Patient left: with call bell/phone within reach;in chair Nurse Communication: Mobility status;Other (comment) (HR better with exertion today) PT Visit Diagnosis: Unsteadiness on feet (R26.81);Other abnormalities of gait and mobility (R26.89);Difficulty in walking, not elsewhere classified (R26.2);Pain Pain - Right/Left: Right Pain - part of body: Leg (back)     Time: 8780-8755 PT Time Calculation (min) (ACUTE ONLY): 25 min  Charges:    $Therapeutic Exercise: 23-37 mins PT General Charges $$ ACUTE PT VISIT: 1 Visit                     Izetta Call, PT, DPT   Acute Rehabilitation Department Office [phone] Secure Chat Communication Preferred   Izetta JULIANNA Call 10/09/2023, 1:24 PM

## 2023-10-09 NOTE — Progress Notes (Signed)
 Occupational Therapy Treatment Patient Details Name: Tracie [name] MRN: [id number] DOB: November 21, 1960 Today's Date: 10/09/2023   History of present illness Pt is a 63 y.o female admitted 6/25 for bil leg swelling and back pain. Chest x-ray showed cardiomegaly and vascular congestion. MRI showed L1-L2 fxs & mod spinal stenosis. Neurosurgery recommended conservative management. Hospital course complicated by persistent afib with RVR, pt declined cardioversion. PMH: Kyphoplasty Nov 2024, DVTs, A-fib, DM II, obesity (BMI 45), and HFpEF.   OT comments  Pt progressing well towards goals. Today's session focused on AE education for energy conservation. Pt able to return demo on use of sock aid, reacher, and long handled sponge to assist in LB ADLs. Pt completing toileting at CGA with max HR 118. Continue to recommend HHOT to optimize independence levels. Will continue to follow acutely.       If plan is discharge home, recommend the following:  A little help with walking and/or transfers;A little help with bathing/dressing/bathroom;Assistance with cooking/housework;Assist for transportation   Equipment Recommendations  Tub/shower bench;Other (comment) (Bariatric)    Recommendations for Other Services      Precautions / Restrictions Precautions Precautions: Other (comment);Back Precaution Booklet Issued: No Recall of Precautions/Restrictions: Impaired (Regarding CHF management) Precaution/Restrictions Comments: low back pain with L1-L2 compression fractures; monitor HR with activity Restrictions Weight Bearing Restrictions Per Provider Order: No       Mobility Bed Mobility Overal bed mobility: Needs Assistance Bed Mobility: Supine to Sit, Sit to Supine     Supine to sit: Supervision, HOB elevated, Used rails Sit to supine: Contact guard assist, HOB elevated, Used rails   General bed mobility comments: heavy reliance on bed features    Transfers Overall transfer level: Needs  assistance Equipment used: Rolling walker (2 wheels) Transfers: Sit to/from Stand, Bed to chair/wheelchair/BSC Sit to Stand: Contact guard assist, From elevated surface     Step pivot transfers: Supervision     General transfer comment: Pt using momentum to rock to stand, once in standing s for safety     Balance Overall balance assessment: Needs assistance Sitting-balance support: Feet supported, No upper extremity supported Sitting balance-Leahy Scale: Fair     Standing balance support: Reliant on assistive device for balance, During functional activity, Bilateral upper extremity supported Standing balance-Leahy Scale: Poor Standing balance comment: reliant on RW       ADL either performed or assessed with clinical judgement   ADL Overall ADL's : Needs assistance/impaired     Lower Body Dressing: Contact guard assist;Sit to/from stand Lower Body Dressing Details (indicate cue type and reason): Return demo for use of sock aid Toilet Transfer: Contact guard assist;Ambulation;BSC/3in1;Rolling walker (2 wheels) Toilet Transfer Details (indicate cue type and reason): short distance bariatric BSC         Functional mobility during ADLs: Contact guard assist;Rolling walker (2 wheels) General ADL Comments: Educated on use of AE for LB ADLs    Extremity/Trunk Assessment Upper Extremity Assessment Upper Extremity Assessment: Overall WFL for tasks assessed   Lower Extremity Assessment Lower Extremity Assessment: Defer to PT evaluation        Vision   Vision Assessment?: No apparent visual deficits         Communication Communication Communication: No apparent difficulties   Cognition Arousal: Alert Behavior During Therapy: WFL for tasks assessed/performed Cognition: History of cognitive impairments     Following commands: Intact        Cueing   Cueing Techniques: Verbal cues  General Comments Husband present for session provided him with purchasing  information    Pertinent Vitals/ Pain       Pain Assessment Pain Assessment: Faces Faces Pain Scale: Hurts even more Pain Location: R knee Pain Descriptors / Indicators: Pressure Pain Intervention(s): Limited activity within patient's tolerance   Frequency  Min 2X/week        Progress Toward Goals  OT Goals(current goals can now be found in the care plan section)  Progress towards OT goals: Progressing toward goals  Acute Rehab OT Goals Patient Stated Goal: to go home OT Goal Formulation: With patient Time For Goal Achievement: 10/18/23 Potential to Achieve Goals: Good ADL Goals Pt Will Perform Grooming: with supervision;standing Pt Will Perform Lower Body Dressing: with supervision;sit to/from stand Pt Will Transfer to Toilet: with supervision;ambulating;bedside commode Pt Will Perform Toileting - Clothing Manipulation and hygiene: with supervision;sit to/from stand  Plan         AM-PAC OT 6 Clicks Daily Activity     Outcome Measure   Help from another person eating meals?: None Help from another person taking care of personal grooming?: A Little Help from another person toileting, which includes using toliet, bedpan, or urinal?: A Little Help from another person bathing (including washing, rinsing, drying)?: A Little Help from another person to put on and taking off regular upper body clothing?: A Little Help from another person to put on and taking off regular lower body clothing?: A Little 6 Click Score: 19    End of Session Equipment Utilized During Treatment: Gait belt;Rolling walker (2 wheels)  OT Visit Diagnosis: Unsteadiness on feet (R26.81);Other abnormalities of gait and mobility (R26.89);Muscle weakness (generalized) (M62.81)   Activity Tolerance Patient tolerated treatment well   Patient Left in bed;with call bell/phone within reach;with family/visitor present   Nurse Communication Mobility status        Time: 8553-8493 OT Time Calculation  (min): 20 min  Charges: OT General Charges $OT Visit: 1 Visit OT Treatments $Self Care/Home Management : 8-22 mins  Adrianne BROCKS, OT  Acute Rehabilitation Services Office [phone] Secure chat preferred   Adrianne GORMAN Savers 10/09/2023, 4:31 PM

## 2023-10-09 NOTE — Progress Notes (Addendum)
 PROGRESS NOTE    Tracie [name]  FMW:[id number] DOB: [birth year] DOA: 10/02/2023 PCP: Rolinda Millman, MD  63 y.o. female with medical history significant for hypertension, type 2 diabetes mellitus, history of DVT, PAF on Eliquis , chronic HFpEF, and chronic low back pain who presents with worsening bilateral lower extremity swelling, shortness of breath, and back pain.  Patient is admitted to the hospitalist service for further management evaluation of heart failure exacerbation, A-fib RVR, severe back pain. Patient found to have L1-L2 fracture on x-ray, neurosurgery consulted.  Cardiology consulted for CHF exacerbation, A-fib with RVR.  Patient is started on IV Lasix , beta-blocker therapy and Eliquis .  She seems to be noncompliant with her medications.   Subjective: - Feels better overall, swelling continues to improve, back pain is more manageable, ambulating  Assessment and Plan:  Persistent A-fib with RVR: HR elevated even with mild exertion, getting out of bed. Cardiology consult appreciated.,  Suspected to be permanent A-fib at this time, declined cardioversion Continue Cardizem , Lopressor  and Eliquis    Acute on chronic diastolic heart failure: Recent EF 60-65%.,  Normal RV -Improving with diuresis, 10.8 L negative -Cards following, continue IV Lasix , Lopressor , losartan  - Add low-dose Aldactone, poor candidate for SGLT2i   Hypokalemia Hypomagnesemia. Replaced.   L1 compression fracture- H/o kyphoplasty 02/2023 Chronic osteoporosis as well as arthritis. MRI lumbar spine reviewed shows interval development of worsening central spinal canal stenosis at L1 secondary to compression fracture, interval development of superior endplate compression deformity of L2. Seen by neurosurgery-advised conservative management due to her morbid obesity, anatomy, cardiac risk factors, she is not a surgical candidate. -Robaxin , lidocaine  patch, Dilaudid  for severe pain - Discontinued Celebrex   with ongoing ARB use Recommend outpatient follow-up with orthopedics as well for arthritis management.   Morbid obesity Body mass index is 46.06 kg/m.  Complicating further her current condition. Diet, exercise and weight reduction advised. Concern for obesity hypoventilation syndrome is also high.   Recommended patient to have outpatient sleep study for further evaluation.   Type 2 diabetes mellitus.  Well-controlled without long-term insulin  use without complication. Hemoglobin A1c 6.2.  In the past hemoglobin A1c has been at 7.6. On glipizide 2.5 mg daily.  Currently on sliding scale insulin  continue moderate scale.    DVT prophylaxis: Eliquis  Code Status: Full code Family Communication: None present Disposition Plan: Home likely 2 to 3 days  Consultants:    Procedures:   Antimicrobials:    Objective: Vitals:   10/09/23 0446 10/09/23 0748 10/09/23 0825 10/09/23 1115  BP: (!) 130/91 (!) 134/90 (!) 134/90 (!) 120/90  Pulse: 89 91 93 81  Resp: 20 20  20   Temp: 98 F (36.7 C) 97.8 F (36.6 C)  97.8 F (36.6 C)  TempSrc: Oral Oral  Oral  SpO2: 93% 95%  96%  Weight:      Height:        Intake/Output Summary (Last 24 hours) at 10/09/2023 1219 Last data filed at 10/09/2023 1000 Gross per 24 hour  Intake 1320 ml  Output 3450 ml  Net -2130 ml   Filed Weights   10/07/23 0354 10/08/23 0512 10/09/23 0012  Weight: (!) 146 kg (!) 145.6 kg (!) 143.3 kg    Examination:  General exam: Obese chronically ill appears calm and comfortable  Respiratory system: Clear to auscultation Cardiovascular system: S1 & S2 heard, regular rhythm Abd: nondistended, soft and nontender.Normal bowel sounds heard. Central nervous system: Alert and oriented. No focal neurological deficits. Extremities: 2+ edema Skin: No rashes Psychiatry:  Mood & affect appropriate.     Data Reviewed:   CBC: Recent Labs  Lab 10/02/23 1711 10/03/23 0523 10/04/23 0246 10/05/23 0309 10/06/23 0438   WBC 10.2 11.5* 10.7* 10.9* 10.0  NEUTROABS 7.7  --   --   --   --   HGB 14.9 14.2 13.7 14.0 13.7  HCT 48.4* 46.9* 44.9 45.5 43.5  MCV 88.8 89.3 89.4 89.2 88.8  PLT 289 254 270 251 246   Basic Metabolic Panel: Recent Labs  Lab 10/03/23 0523 10/04/23 0246 10/05/23 0309 10/06/23 0438 10/07/23 0327 10/08/23 0159 10/09/23 0235  NA 139   < > 137 135 138 138 135  K 2.9*   < > 3.6 3.2* 3.4* 3.8 4.2  CL 107   < > 103 100 99 100 96*  CO2 21*   < > 23 27 32 31 30  GLUCOSE 160*   < > 138* 150* 157* 174* 173*  BUN 10   < > 14 11 10 14 15   CREATININE 0.59   < > 0.74 0.63 0.67 0.70 0.75  CALCIUM 8.6*   < > 8.5* 8.2* 8.5* 8.6* 8.7*  MG 1.5*  --   --  1.3* 1.6* 1.6* 1.8   < > = values in this interval not displayed.   GFR: Estimated Creatinine Clearance: 110.4 mL/min (by C-G formula based on SCr of 0.75 mg/dL). Liver Function Tests: Recent Labs  Lab 10/02/23 1711  AST 19  ALT 14  ALKPHOS 60  BILITOT 0.9  PROT 8.6*  ALBUMIN 3.6   No results for input(s): LIPASE, AMYLASE in the last 168 hours. No results for input(s): AMMONIA in the last 168 hours. Coagulation Profile: Recent Labs  Lab 10/04/23 0246  INR 1.2   Cardiac Enzymes: No results for input(s): CKTOTAL, CKMB, CKMBINDEX, TROPONINI in the last 168 hours. BNP (last 3 results) No results for input(s): PROBNP in the last 8760 hours. HbA1C: No results for input(s): HGBA1C in the last 72 hours. CBG: Recent Labs  Lab 10/08/23 1103 10/08/23 1606 10/08/23 2117 10/09/23 0621 10/09/23 1112  GLUCAP 196* 165* 184* 202* 165*   Lipid Profile: No results for input(s): CHOL, HDL, LDLCALC, TRIG, CHOLHDL, LDLDIRECT in the last 72 hours. Thyroid Function Tests: No results for input(s): TSH, T4TOTAL, FREET4, T3FREE, THYROIDAB in the last 72 hours. Anemia Panel: No results for input(s): VITAMINB12, FOLATE, FERRITIN, TIBC, IRON, RETICCTPCT in the last 72 hours. Urine analysis:     Component Value Date/Time   COLORURINE YELLOW 02/24/2023 0518   APPEARANCEUR CLEAR 02/24/2023 0518   APPEARANCEUR Clear 07/16/2018 1126   LABSPEC 1.030 02/24/2023 0518   PHURINE 5.0 02/24/2023 0518   GLUCOSEU >=500 (A) 02/24/2023 0518   HGBUR NEGATIVE 02/24/2023 0518   BILIRUBINUR NEGATIVE 02/24/2023 0518   BILIRUBINUR Negative 07/16/2018 1126   KETONESUR 5 (A) 02/24/2023 0518   PROTEINUR NEGATIVE 02/24/2023 0518   UROBILINOGEN 0.2 01/04/2009 1218   NITRITE NEGATIVE 02/24/2023 0518   LEUKOCYTESUR NEGATIVE 02/24/2023 0518   Sepsis Labs: @LABRCNTIP (procalcitonin:4,lacticidven:4)  )No results found for this or any previous visit (from the past 240 hours).   Radiology Studies: No results found.   Scheduled Meds:  apixaban   5 mg Oral BID   celecoxib   200 mg Oral Daily   diltiazem   120 mg Oral Daily   furosemide   60 mg Intravenous BID   insulin  aspart  0-15 Units Subcutaneous TID WC   insulin  aspart  0-5 Units Subcutaneous QHS   lidocaine   1 patch Transdermal Daily   [  START ON 10/10/2023] losartan   50 mg Oral Daily   methocarbamol   500 mg Oral TID   metoprolol  tartrate  100 mg Oral BID   pantoprazole   40 mg Oral Daily   potassium chloride   40 mEq Oral TID   sodium chloride  flush  3 mL Intravenous Q12H   Continuous Infusions:   LOS: 6 days    Time spent:    Sigurd Pac, MD [facility]   10/09/2023, 12:19 PM

## 2023-10-09 NOTE — Progress Notes (Signed)
  Progress Note  Patient Name: Tracie [name] Date of Encounter: 10/09/2023 [facility] Cardiologist: Ozell Fell, MD   Interval Summary   -1.6 L yesterday, -10.8 L on admission.  Creatinine stable at 0.75.  BP 134/90.  Reports dyspnea improving  Vital Signs Vitals:   10/09/23 0012 10/09/23 0446 10/09/23 0748 10/09/23 0825  BP: (!) 144/98 (!) 130/91 (!) 134/90 (!) 134/90  Pulse: 70 89 91 93  Resp: 20 20 20    Temp: 97.6 F (36.4 C) 98 F (36.7 C) 97.8 F (36.6 C)   TempSrc: Oral Oral Oral   SpO2: 98% 93% 95%   Weight: (!) 143.3 kg     Height:       Intake/Output Summary (Last 24 hours) at 10/09/2023 1006 Last data filed at 10/09/2023 1000 Gross per 24 hour  Intake 1320 ml  Output 3450 ml  Net -2130 ml      10/09/2023   12:12 AM 10/08/2023    5:12 AM 10/07/2023    3:54 AM  Last 3 Weights  Weight (lbs) 315 lb 14.4 oz 320 lb 15.8 oz 321 lb 14 oz  Weight (kg) 143.291 kg 145.6 kg 146 kg     Telemetry/ECG  Atrial fibrillation, rate controlled- Personally Reviewed  Physical Exam  GEN: No acute distress.   Neck: unable to assess JVD Cardiac: irregularly irregular rhythm,  no murmurs, rubs, or gallops.  Respiratory: mildly decreased at bases. GI: Soft, nontender, non-distended  MS: 1+ LE edema  Assessment & Plan  63 year old here with atrial fibrillation rapid ventricular response, hypokalemia, suspected diastolic heart failure exacerbation hypertension type 2 diabetes. Came in with chief complaint of leg swelling shortness of breath and low back pain   Acute on chronic HFpEF Hypertension  Echo: LVEF 60-65%, normal RV function CXR showed vascular congestion BNP 190 Given IV Lasix   Net -11 L this admission  Weight 316 lb from 332 lb on admission  Renal function remains stable  Continue losartan  100 mg daily Continue Lopressor  100 mg BID Continue to monitor and replete electrolytes  Continue strict I&O's, daily weights, daily BMPs/Mag Continue IV  Lasix   Atrial fibrillation with RVR  Known history of A. Fib, likely permanent at this point.  Has declined cardioversion Rate controlled Continue Eliquis  5 mg BID Continue diltiazem  120 mg daily Continue Lopressor  100 mg BID   Per primary L1 compression fracture Chronic osteoporosis Diabetes    For questions or updates, please contact [facility] Please consult [email or web address] for contact info under       Signed, Lonni LITTIE Nanas, MD

## 2023-10-10 DIAGNOSIS — I5033 Acute on chronic diastolic (congestive) heart failure: Secondary | ICD-10-CM | POA: Diagnosis not present

## 2023-10-10 DIAGNOSIS — I482 Chronic atrial fibrillation, unspecified: Secondary | ICD-10-CM | POA: Diagnosis not present

## 2023-10-10 LAB — CBC
HCT: 48.4 % — ABNORMAL HIGH (ref 36.0–46.0)
Hemoglobin: 15.2 g/dL — ABNORMAL HIGH (ref 12.0–15.0)
MCH: 27.6 pg (ref 26.0–34.0)
MCHC: 31.4 g/dL (ref 30.0–36.0)
MCV: 87.8 fL (ref 80.0–100.0)
Platelets: 294 10*3/uL (ref 150–400)
RBC: 5.51 MIL/uL — ABNORMAL HIGH (ref 3.87–5.11)
RDW: 14.1 % (ref 11.5–15.5)
WBC: 8.9 10*3/uL (ref 4.0–10.5)
nRBC: 0 % (ref 0.0–0.2)

## 2023-10-10 LAB — GLUCOSE, CAPILLARY
Glucose-Capillary: 196 mg/dL — ABNORMAL HIGH (ref 70–99)
Glucose-Capillary: 171 mg/dL — ABNORMAL HIGH (ref 70–99)
Glucose-Capillary: 126 mg/dL — ABNORMAL HIGH (ref 70–99)

## 2023-10-10 LAB — MAGNESIUM: Magnesium: 1.4 mg/dL — ABNORMAL LOW (ref 1.7–2.4)

## 2023-10-10 LAB — BASIC METABOLIC PANEL WITH GFR
Anion gap: 11 (ref 5–15)
BUN: 17 mg/dL (ref 8–23)
CO2: 29 mmol/L (ref 22–32)
Calcium: 9 mg/dL (ref 8.9–10.3)
Chloride: 95 mmol/L — ABNORMAL LOW (ref 98–111)
Creatinine, Ser: 0.79 mg/dL (ref 0.44–1.00)
GFR, Estimated: >60 mL/min (ref >60)
Glucose, Bld: 184 mg/dL — ABNORMAL HIGH (ref 70–99)
Potassium: 4.1 mmol/L (ref 3.5–5.1)
Sodium: 135 mmol/L (ref 135–145)

## 2023-10-10 MED ORDER — LACTULOSE 10 GM/15ML PO SOLN
20.0000 g | Freq: Two times a day (BID) | ORAL | Status: DC
  Administered 2023-10-10 – 2023-10-17 (×5): 20 g via ORAL
  Filled 2023-10-10 (×13): qty 30

## 2023-10-10 MED ORDER — MAGNESIUM SULFATE 4 GM/100ML IV SOLN
4.0000 g | Freq: Once | INTRAVENOUS | Status: AC
  Administered 2023-10-10: 4 g via INTRAVENOUS
  Filled 2023-10-10: qty 100

## 2023-10-10 NOTE — Plan of Care (Signed)
  Problem: Education: Goal: Ability to describe self-care measures that may prevent or decrease complications (Diabetes Survival Skills Education) will improve Outcome: Not Progressing Goal: Individualized Educational Video(s) Outcome: Not Progressing   Problem: Coping: Goal: Ability to adjust to condition or change in health will improve Outcome: Not Progressing   Problem: Fluid Volume: Goal: Ability to maintain a balanced intake and output will improve Outcome: Not Progressing   Problem: Health Behavior/Discharge Planning: Goal: Ability to identify and utilize available resources and services will improve Outcome: Not Progressing Goal: Ability to manage health-related needs will improve Outcome: Not Progressing   Problem: Metabolic: Goal: Ability to maintain appropriate glucose levels will improve Outcome: Not Progressing   Problem: Nutritional: Goal: Maintenance of adequate nutrition will improve Outcome: Not Progressing Goal: Progress toward achieving an optimal weight will improve Outcome: Not Progressing   Problem: Skin Integrity: Goal: Risk for impaired skin integrity will decrease Outcome: Not Progressing   Problem: Tissue Perfusion: Goal: Adequacy of tissue perfusion will improve Outcome: Not Progressing   Problem: Education: Goal: Knowledge of General Education information will improve Description: Including pain rating scale, medication(s)/side effects and non-pharmacologic comfort measures Outcome: Not Progressing   Problem: Health Behavior/Discharge Planning: Goal: Ability to manage health-related needs will improve Outcome: Not Progressing   Problem: Clinical Measurements: Goal: Ability to maintain clinical measurements within normal limits will improve Outcome: Not Progressing Goal: Will remain free from infection Outcome: Not Progressing Goal: Diagnostic test results will improve Outcome: Not Progressing Goal: Respiratory complications will  improve Outcome: Not Progressing Goal: Cardiovascular complication will be avoided Outcome: Not Progressing   Problem: Activity: Goal: Risk for activity intolerance will decrease Outcome: Not Progressing   Problem: Nutrition: Goal: Adequate nutrition will be maintained Outcome: Not Progressing   Problem: Coping: Goal: Level of anxiety will decrease Outcome: Not Progressing   Problem: Elimination: Goal: Will not experience complications related to bowel motility Outcome: Not Progressing Goal: Will not experience complications related to urinary retention Outcome: Not Progressing   Problem: Pain Managment: Goal: General experience of comfort will improve and/or be controlled Outcome: Not Progressing   Problem: Safety: Goal: Ability to remain free from injury will improve Outcome: Not Progressing   Problem: Skin Integrity: Goal: Risk for impaired skin integrity will decrease Outcome: Not Progressing   Problem: Education: Goal: Ability to demonstrate management of disease process will improve Outcome: Not Progressing Goal: Ability to verbalize understanding of medication therapies will improve Outcome: Not Progressing Goal: Individualized Educational Video(s) Outcome: Not Progressing   Problem: Activity: Goal: Capacity to carry out activities will improve Outcome: Not Progressing   Problem: Cardiac: Goal: Ability to achieve and maintain adequate cardiopulmonary perfusion will improve Outcome: Not Progressing

## 2023-10-10 NOTE — Progress Notes (Addendum)
  Progress Note  Patient Name: Tracie [name] Date of Encounter: 10/10/2023 [facility] Cardiologist: Ozell Fell, MD   Interval Summary   Patient up in chair, feels comfortable States that she still slightly above her baseline in terms of volume status LE edema much improved, reports improvement in her shortness of breath  Renal function stable  Continues to have good urine output   Vital Signs Vitals:   10/09/23 1950 10/10/23 0042 10/10/23 0452 10/10/23 0715  BP: (!) 151/95 (!) 155/80 (!) 129/98 100/78  Pulse: 90 67 88 92  Resp: 20 20 20 20   Temp: 97.9 F (36.6 C) 97.7 F (36.5 C) 97.7 F (36.5 C) 97.8 F (36.6 C)  TempSrc: Oral Oral Oral Oral  SpO2: 94% 96% 95% 96%  Weight:   (!) 142.7 kg   Height:        Intake/Output Summary (Last 24 hours) at 10/10/2023 0908 Last data filed at 10/10/2023 0803 Gross per 24 hour  Intake 774 ml  Output 2150 ml  Net -1376 ml      10/10/2023    4:52 AM 10/09/2023   12:12 AM 10/08/2023    5:12 AM  Last 3 Weights  Weight (lbs) 314 lb 9.6 oz 315 lb 14.4 oz 320 lb 15.8 oz  Weight (kg) 142.702 kg 143.291 kg 145.6 kg     Telemetry/ECG  Rate controlled atrial fibrillation, HR 80s - Personally Reviewed  Physical Exam  GEN: No acute distress.   Neck: unable to assess JVD Cardiac: irregularly irregular, no murmurs, rubs, or gallops.  Respiratory: Clear to auscultation bilaterally. GI: Soft, nontender, non-distended  MS: mild LE edema, much improved   Assessment & Plan  63 year old here with atrial fibrillation rapid ventricular response, hypokalemia, suspected diastolic heart failure exacerbation hypertension type 2 diabetes. Came in with chief complaint of leg swelling shortness of breath and low back pain    Acute on chronic HFpEF Hypertension  Echo: LVEF 60-65%, normal RV function CXR showed vascular congestion BNP 190 Given IV Lasix   Net -12 L this admission  Weight 314 lb from 332 lb on admission  Renal function  remains stable  BP 100/78, HR 86 Continue losartan  50 mg daily Continue Lopressor  100 mg BID Continue spironolactone 12.5 mg daily  Continue to monitor and replete electrolytes  Continue strict I&O's, daily weights, daily BMPs/Mag Continue IV Lasix  today and likely switch to PO torsemide tomorrow    Atrial fibrillation with RVR  Known history of A. Fib, likely permanent at this point.  Has declined cardioversion Rate controlled Continue Eliquis  5 mg BID Continue diltiazem  120 mg daily Continue Lopressor  100 mg BID    Per primary L1 compression fracture Chronic osteoporosis Diabetes   For questions or updates, please contact [facility] Please consult [email or web address] for contact info under       Signed, Waddell DELENA Donath, PA-C   Patient seen and examined.  Agree with above documentation.  On exam, patient is alert and oriented, regular rate and rhythm, no murmurs, diminished breath sounds, trace edema, JVD difficult to assess given habitus.  -1.4 L yesterday, -12.2 L admission.  Creatinine stable at 0.8.  She has diuresed well, suspect approaching euvolemia.  Will continue IV Lasix  today and plan to likely transition to p.o. torsemide tomorrow.  Lonni LITTIE Nanas, MD

## 2023-10-10 NOTE — Progress Notes (Signed)
 PROGRESS NOTE    Tracie [name]  FMW:[id number] DOB: 1960/12/29 DOA: 10/02/2023 PCP: Rolinda Millman, MD  63 y.o. female with medical history significant for hypertension, type 2 diabetes mellitus, history of DVT, PAF on Eliquis , chronic HFpEF, and chronic low back pain who presents with worsening bilateral lower extremity swelling, shortness of breath, and back pain.  Patient is admitted to the hospitalist service for further management evaluation of heart failure exacerbation, A-fib RVR, severe back pain. Patient found to have L1-L2 fracture on x-ray, neurosurgery consulted.  Cardiology consulted for CHF exacerbation, A-fib with RVR.  Patient is started on IV Lasix , beta-blocker therapy and Eliquis .  She seems to be noncompliant with her medications.   Subjective: - Needs to feel better overall, still has fair bit of back pain but ambulating better  Assessment and Plan:  Persistent A-fib with RVR: Cardiology consult appreciated.,  Suspected to be permanent A-fib at this time, declined cardioversion Continue Cardizem , Lopressor  and Eliquis    Acute on chronic diastolic heart failure: Recent EF 60-65%.,  Normal RV -Improving with diuresis, 12 L negative -Cards following, continue IV Lasix , Lopressor , losartan , Aldactone  -poor candidate for SGLT2i   Hypokalemia Hypomagnesemia. Replaced.   L1 compression fracture- H/o kyphoplasty 02/2023 Chronic osteoporosis as well as arthritis. MRI lumbar spine reviewed shows interval development of worsening central spinal canal stenosis at L1 secondary to compression fracture, interval development of superior endplate compression deformity of L2. Seen by neurosurgery-advised conservative management due to her morbid obesity, anatomy, cardiac risk factors, she is not a surgical candidate. -Robaxin , lidocaine  patch, Dilaudid  for severe pain - Discontinued Celebrex  with ongoing ARB use Recommend outpatient follow-up with orthopedics as well for  arthritis management.   Morbid obesity Body mass index is 46.06 kg/m.  Complicating further her current condition. Diet, exercise and weight reduction advised. Concern for obesity hypoventilation syndrome is also high.   Recommended patient to have outpatient sleep study for further evaluation.   Type 2 diabetes mellitus.  Well-controlled without long-term insulin  use without complication. Hemoglobin A1c 6.2.  In the past hemoglobin A1c has been at 7.6. On glipizide 2.5 mg daily.  Currently on sliding scale insulin  continue moderate scale.    DVT prophylaxis: Eliquis  Code Status: Full code Family Communication: None present Disposition Plan: Home likely tomorrow with home health services  Consultants: Cards   Procedures:   Antimicrobials:    Objective: Vitals:   10/10/23 0042 10/10/23 0452 10/10/23 0715 10/10/23 1032  BP: (!) 155/80 (!) 129/98 100/78 (!) 119/91  Pulse: 67 88 92 98  Resp: 20 20 20 20   Temp: 97.7 F (36.5 C) 97.7 F (36.5 C) 97.8 F (36.6 C) 98 F (36.7 C)  TempSrc: Oral Oral Oral Oral  SpO2: 96% 95% 96% 95%  Weight:  (!) 142.7 kg    Height:        Intake/Output Summary (Last 24 hours) at 10/10/2023 1145 Last data filed at 10/10/2023 1033 Gross per 24 hour  Intake 774 ml  Output 1750 ml  Net -976 ml   Filed Weights   10/08/23 0512 10/09/23 0012 10/10/23 0452  Weight: (!) 145.6 kg (!) 143.3 kg (!) 142.7 kg    Examination:  General exam: Obese chronically ill female sitting up in the recliner, AAO x 3 HEENT: Neck obese unable to assess JVD CVS: S1-S2, regular rhythm Lungs: Decreased breath sounds at the bases Abdomen: Soft, nontender, bowel sounds present Remedies: 1+ edema Skin: No rashes Psychiatry:  Mood & affect appropriate.  Data Reviewed:   CBC: Recent Labs  Lab 10/04/23 0246 10/05/23 0309 10/06/23 0438 10/10/23 0226  WBC 10.7* 10.9* 10.0 8.9  HGB 13.7 14.0 13.7 15.2*  HCT 44.9 45.5 43.5 48.4*  MCV 89.4 89.2 88.8  87.8  PLT 270 251 246 294   Basic Metabolic Panel: Recent Labs  Lab 10/06/23 0438 10/07/23 0327 10/08/23 0159 10/09/23 0235 10/10/23 0226  NA 135 138 138 135 135  K 3.2* 3.4* 3.8 4.2 4.1  CL 100 99 100 96* 95*  CO2 27 32 31 30 29   GLUCOSE 150* 157* 174* 173* 184*  BUN 11 10 14 15 17   CREATININE 0.63 0.67 0.70 0.75 0.79  CALCIUM 8.2* 8.5* 8.6* 8.7* 9.0  MG 1.3* 1.6* 1.6* 1.8 1.4*   GFR: Estimated Creatinine Clearance: 110.1 mL/min (by C-G formula based on SCr of 0.79 mg/dL). Liver Function Tests: No results for input(s): AST, ALT, ALKPHOS, BILITOT, PROT, ALBUMIN in the last 168 hours.  No results for input(s): LIPASE, AMYLASE in the last 168 hours. No results for input(s): AMMONIA in the last 168 hours. Coagulation Profile: Recent Labs  Lab 10/04/23 0246  INR 1.2   Cardiac Enzymes: No results for input(s): CKTOTAL, CKMB, CKMBINDEX, TROPONINI in the last 168 hours. BNP (last 3 results) No results for input(s): PROBNP in the last 8760 hours. HbA1C: No results for input(s): HGBA1C in the last 72 hours. CBG: Recent Labs  Lab 10/09/23 1112 10/09/23 1608 10/09/23 2108 10/10/23 0621 10/10/23 1052  GLUCAP 165* 183* 153* 196* 171*   Lipid Profile: No results for input(s): CHOL, HDL, LDLCALC, TRIG, CHOLHDL, LDLDIRECT in the last 72 hours. Thyroid Function Tests: No results for input(s): TSH, T4TOTAL, FREET4, T3FREE, THYROIDAB in the last 72 hours. Anemia Panel: No results for input(s): VITAMINB12, FOLATE, FERRITIN, TIBC, IRON, RETICCTPCT in the last 72 hours. Urine analysis:    Component Value Date/Time   COLORURINE YELLOW 02/24/2023 0518   APPEARANCEUR CLEAR 02/24/2023 0518   APPEARANCEUR Clear 07/16/2018 1126   LABSPEC 1.030 02/24/2023 0518   PHURINE 5.0 02/24/2023 0518   GLUCOSEU >=500 (A) 02/24/2023 0518   HGBUR NEGATIVE 02/24/2023 0518   BILIRUBINUR NEGATIVE 02/24/2023 0518   BILIRUBINUR  Negative 07/16/2018 1126   KETONESUR 5 (A) 02/24/2023 0518   PROTEINUR NEGATIVE 02/24/2023 0518   UROBILINOGEN 0.2 01/04/2009 1218   NITRITE NEGATIVE 02/24/2023 0518   LEUKOCYTESUR NEGATIVE 02/24/2023 0518   Sepsis Labs: @LABRCNTIP (procalcitonin:4,lacticidven:4)  )No results found for this or any previous visit (from the past 240 hours).   Radiology Studies: No results found.   Scheduled Meds:  apixaban   5 mg Oral BID   diltiazem   120 mg Oral Daily   furosemide   60 mg Intravenous BID   insulin  aspart  0-15 Units Subcutaneous TID WC   insulin  aspart  0-5 Units Subcutaneous QHS   lactulose  20 g Oral BID   lidocaine   1 patch Transdermal Daily   losartan   50 mg Oral Daily   methocarbamol   500 mg Oral TID   metoprolol  tartrate  100 mg Oral BID   pantoprazole   40 mg Oral Daily   potassium chloride   40 mEq Oral TID   sodium chloride  flush  3 mL Intravenous Q12H   spironolactone  12.5 mg Oral Daily   Continuous Infusions:  magnesium  sulfate bolus IVPB 4 g (10/10/23 0956)     LOS: 7 days    Time spent:    Sigurd Pac, MD [facility]   10/10/2023, 11:45 AM

## 2023-10-10 NOTE — Progress Notes (Signed)
 Mobility Specialist Progress Note:    10/10/23 1010  Mobility  Activity Ambulated with assistance in hallway  Level of Assistance Contact guard assist, steadying assist  Assistive Device Front wheel walker  Distance Ambulated (ft) 150 ft  Activity Response Tolerated fair (Pt HR elevated to 180s)  Mobility Referral Yes  Mobility visit 1 Mobility  Mobility Specialist Start Time (ACUTE ONLY) 1010  Mobility Specialist Stop Time (ACUTE ONLY) 1021  Mobility Specialist Time Calculation (min) (ACUTE ONLY) 11 min   Pt agreeable to session. Enjoyed walking but needed to take two breaks due to HR. Second break HR elevated to 180s. Had chair follow as needed. C/o fatigue but otherwise doing well. Left pt in recliner w/ all needs met and HR quickly recovering to low 100s  Venetia Keel Mobility Specialist Please Neurosurgeon or Rehab Office at [phone]

## 2023-10-10 NOTE — Progress Notes (Signed)
 PT Cancellation Note  Patient Details Name: Tracie [name] MRN: [id number] DOB: [birth year]   Cancelled Treatment:    Reason Eval/Treat Not Completed: Other (comment) (Refused due to headache. Will return as able.)   [name] [name] 10/10/2023, 1:10 PM [name] M,PT Acute Rehab Services [phone]

## 2023-10-11 DIAGNOSIS — I4821 Permanent atrial fibrillation: Secondary | ICD-10-CM | POA: Diagnosis not present

## 2023-10-11 DIAGNOSIS — I5033 Acute on chronic diastolic (congestive) heart failure: Secondary | ICD-10-CM | POA: Diagnosis not present

## 2023-10-11 DIAGNOSIS — I10 Essential (primary) hypertension: Secondary | ICD-10-CM | POA: Diagnosis not present

## 2023-10-11 LAB — GLUCOSE, CAPILLARY
Glucose-Capillary: 187 mg/dL — ABNORMAL HIGH (ref 70–99)
Glucose-Capillary: 188 mg/dL — ABNORMAL HIGH (ref 70–99)
Glucose-Capillary: 201 mg/dL — ABNORMAL HIGH (ref 70–99)
Glucose-Capillary: 155 mg/dL — ABNORMAL HIGH (ref 70–99)
Glucose-Capillary: 177 mg/dL — ABNORMAL HIGH (ref 70–99)

## 2023-10-11 LAB — BASIC METABOLIC PANEL WITH GFR
Anion gap: 8 (ref 5–15)
BUN: 16 mg/dL (ref 8–23)
CO2: 29 mmol/L (ref 22–32)
Calcium: 8.8 mg/dL — ABNORMAL LOW (ref 8.9–10.3)
Chloride: 97 mmol/L — ABNORMAL LOW (ref 98–111)
Creatinine, Ser: 0.9 mg/dL (ref 0.44–1.00)
GFR, Estimated: >60 mL/min (ref >60)
Glucose, Bld: 174 mg/dL — ABNORMAL HIGH (ref 70–99)
Potassium: 4.2 mmol/L (ref 3.5–5.1)
Sodium: 134 mmol/L — ABNORMAL LOW (ref 135–145)

## 2023-10-11 LAB — MAGNESIUM: Magnesium: 1.9 mg/dL (ref 1.7–2.4)

## 2023-10-11 MED ORDER — DILTIAZEM HCL ER COATED BEADS 120 MG PO CP24
120.0000 mg | ORAL_CAPSULE | Freq: Once | ORAL | Status: AC
  Administered 2023-10-11: 120 mg via ORAL
  Filled 2023-10-11: qty 1

## 2023-10-11 MED ORDER — DILTIAZEM HCL ER COATED BEADS 240 MG PO CP24
240.0000 mg | ORAL_CAPSULE | Freq: Every day | ORAL | Status: DC
  Administered 2023-10-12 – 2023-10-17 (×6): 240 mg via ORAL
  Filled 2023-10-11 (×6): qty 1

## 2023-10-11 MED ORDER — TORSEMIDE 20 MG PO TABS
20.0000 mg | ORAL_TABLET | Freq: Every day | ORAL | Status: DC
  Administered 2023-10-11 – 2023-10-12 (×2): 20 mg via ORAL
  Filled 2023-10-11 (×2): qty 1

## 2023-10-11 MED ORDER — TORSEMIDE 20 MG PO TABS: 40.0000 mg | ORAL_TABLET | Freq: Every day | ORAL | Status: DC

## 2023-10-11 NOTE — Care Management Important Message (Signed)
 Important Message  Patient Details  Name: Chequita Mofield MRN: [id number] Date of Birth: 04-27-1960   Important Message Given:  Yes - Medicare IM     Vonzell Arrie Sharps 10/11/2023, 10:50 AM

## 2023-10-11 NOTE — Progress Notes (Signed)
  Progress Note  Patient Name: Tracie [name] Date of Encounter: 10/11/2023 [facility] Cardiologist: Ozell Fell, MD   Interval Summary   Feeling well this am. Notes that her HR is controlled at rest but she gets tachycardic with any activity. Breathing better. Leg swelling improved.   Vital Signs Vitals:   10/10/23 1948 10/11/23 0022 10/11/23 0503 10/11/23 0524  BP: 137/79     Pulse:      Resp: 18 18  19   Temp: 98.3 F (36.8 C) 98.8 F (37.1 C)  98.1 F (36.7 C)  TempSrc: Oral Axillary  Oral  SpO2:      Weight:   (!) 140.7 kg   Height:        Intake/Output Summary (Last 24 hours) at 10/11/2023 0759 Last data filed at 10/11/2023 0525 Gross per 24 hour  Intake 896 ml  Output 2650 ml  Net -1754 ml      10/11/2023    5:03 AM 10/10/2023    4:52 AM 10/09/2023   12:12 AM  Last 3 Weights  Weight (lbs) 310 lb 3 oz 314 lb 9.6 oz 315 lb 14.4 oz  Weight (kg) 140.7 kg 142.702 kg 143.291 kg      Telemetry/ECG  Atrial fibrillation heart rate in the 80s and 90s at rest- Personally Reviewed  Physical Exam  GEN: Obese woman, in no acute distress.   Neck: No JVD Cardiac: irregularly irregular, no murmurs, rubs, or gallops.  Respiratory: Clear to auscultation bilaterally. GI: Soft, nontender, non-distended  MS: Trace bilateral pretibial and ankle edema  Assessment & Plan  Permanent atrial fibrillation: on apixaban , diltiazem , and metoprolol . Has been noncompliant with meds at home.  I emphasized the importance of medication adherence with her.  She understands that her heart failure this admission is driven by uncontrolled atrial fibrillation and she was not taking her medications.  She seems to exhibit better insight and understands her need to stay on medications this time.  With her increased heart rates during activity, I will double her diltiazem  to 240 mg daily.  Will arrange outpatient cardiology follow-up. HTN: complicated by morbid obesity. BP controlled last 24  hours on my review of vitals, last 138/86 mmHg. Continue metoprolol , diltiazem , and aldactone .  Acute on chronic HFpEF: weight down 20# with IV diuresis still on lasix  60 mg IV BID.  Ready to transition to oral diuretic therapy.  Agree with torsemide  20 mg daily.  [facility] will sign off.   The patient is ready for discharge tomorrow from a cardiac standpoint. Medication Recommendations: Increase diltiazem  to 240 mg daily (orders written) Other recommendations (labs, testing, etc): None Follow up as an outpatient: Will arrange  For questions or updates, please contact [facility] Please consult [email or web address] for contact info under       Signed, Ozell Fell, MD

## 2023-10-11 NOTE — TOC Transition Note (Signed)
 Transition of Care [facility]) - Discharge Note   Patient Details  Name: Tracie [name] MRN: [id number] Date of Birth: 10/08/1960  [facility]) CM/SW Contact:  Waddell Barnie Rama, RN Phone Number: 10/11/2023, 3:21 PM   Clinical Narrative:    Plan for dc tomorrow, has changed to po diuretics.  She is set up with [facility] . She has transport home.         Patient Goals and CMS Choice            Discharge Placement                       Discharge Plan and Services Additional resources added to the After Visit Summary for                                       Social Drivers of Health (SDOH) Interventions SDOH Screenings   Food Insecurity: No Food Insecurity (10/03/2023)  Housing: Low Risk  (10/03/2023)  Transportation Needs: No Transportation Needs (10/03/2023)  Utilities: Not At Risk (10/03/2023)  Depression (PHQ2-9): Low Risk  (02/26/2020)  Tobacco Use: Medium Risk (10/03/2023)     Readmission Risk Interventions    10/08/2023    2:56 PM  Readmission Risk Prevention Plan  Post Dischage Appt Complete  Medication Screening Complete  Transportation Screening Complete

## 2023-10-11 NOTE — Plan of Care (Signed)
  Problem: Education: Goal: Ability to demonstrate management of disease process will improve Outcome: Progressing   Problem: Education: Goal: Ability to verbalize understanding of medication therapies will improve Outcome: Progressing   Problem: Activity: Goal: Capacity to carry out activities will improve Outcome: Progressing   Problem: Cardiac: Goal: Ability to achieve and maintain adequate cardiopulmonary perfusion will improve Outcome: Progressing   

## 2023-10-11 NOTE — Progress Notes (Signed)
 PROGRESS NOTE    Tracie [name]  FMW:[id number] DOB: [birth year] DOA: 10/02/2023 PCP: Rolinda Millman, MD  63 y.o. female with medical history significant for hypertension, type 2 diabetes mellitus, history of DVT, PAF on Eliquis , chronic HFpEF, and chronic low back pain who presents with worsening bilateral lower extremity swelling, shortness of breath, and back pain.  Patient is admitted to the hospitalist service for further management evaluation of heart failure exacerbation, A-fib RVR, severe back pain. Patient found to have L1-L2 fracture on x-ray, neurosurgery consulted.  Cardiology consulted for CHF exacerbation, A-fib with RVR.  Patient is started on IV Lasix , beta-blocker therapy and Eliquis .  She seems to be noncompliant with her medications.   Subjective: - Spoke to cards this morning, changing to oral diuretics, plan for DC home tomorrow, feels a little better overall  Assessment and Plan:  Persistent A-fib with RVR: Cardiology consult appreciated.,  Suspected to be permanent A-fib at this time, declined cardioversion Continue Cardizem , Lopressor  and Eliquis    Acute on chronic diastolic heart failure: Recent EF 60-65%.,  Normal RV -Improving with diuresis, 12 L negative, starting oral torsemide  today -Cards following, continue Lopressor , losartan , Aldactone   -poor candidate for SGLT2i -Discharge planning   Hypokalemia Hypomagnesemia. Replaced.   L1 compression fracture- H/o kyphoplasty 02/2023 Chronic osteoporosis as well as arthritis. MRI lumbar spine reviewed shows interval development of worsening central spinal canal stenosis at L1 secondary to compression fracture, interval development of superior endplate compression deformity of L2. Seen by neurosurgery-advised conservative management due to her morbid obesity, anatomy, cardiac risk factors, she is not a surgical candidate. -Robaxin , lidocaine  patch, Dilaudid  for severe pain - Discontinued Celebrex  with  ongoing ARB use Recommend outpatient follow-up with orthopedics as well for arthritis management.   Morbid obesity Body mass index is 46.06 kg/m.  Complicating further her current condition. Diet, exercise and weight reduction advised. Concern for obesity hypoventilation syndrome is also high.   Recommended patient to have outpatient sleep study for further evaluation.   Type 2 diabetes mellitus.  Well-controlled without long-term insulin  use without complication. Hemoglobin A1c 6.2.  In the past hemoglobin A1c has been at 7.6. On glipizide  2.5 mg daily.  Currently on sliding scale insulin  continue moderate scale.    DVT prophylaxis: Eliquis  Code Status: Full code Family Communication: None present Disposition Plan: Home likely tomorrow with home health services  Consultants: Cards   Procedures:   Antimicrobials:    Objective: Vitals:   10/11/23 0524 10/11/23 0758 10/11/23 0838 10/11/23 0841  BP:  138/86 138/86 138/86  Pulse:  85 92   Resp: 19 20    Temp: 98.1 F (36.7 C) 98.2 F (36.8 C)    TempSrc: Oral Oral    SpO2:  95%    Weight:      Height:        Intake/Output Summary (Last 24 hours) at 10/11/2023 1054 Last data filed at 10/11/2023 0525 Gross per 24 hour  Intake 479 ml  Output 2450 ml  Net -1971 ml   Filed Weights   10/09/23 0012 10/10/23 0452 10/11/23 0503  Weight: (!) 143.3 kg (!) 142.7 kg (!) 140.7 kg    Examination:  General exam: Morbidly obese chronically ill female sitting up in bed, AAO x 3 HEENT: Neck obese unable to assess JVD CVS: S1-S2, regular rhythm Lungs: Decreased breath sounds to bases Abdomen: Soft, nontender, bowel sounds present Remedies: Trace edema Skin: No rashes Psychiatry:  Mood & affect appropriate.     Data Reviewed:  CBC: Recent Labs  Lab 10/05/23 0309 10/06/23 0438 10/10/23 0226  WBC 10.9* 10.0 8.9  HGB 14.0 13.7 15.2*  HCT 45.5 43.5 48.4*  MCV 89.2 88.8 87.8  PLT 251 246 294   Basic Metabolic  Panel: Recent Labs  Lab 10/07/23 0327 10/08/23 0159 10/09/23 0235 10/10/23 0226 10/11/23 0241  NA 138 138 135 135 134*  K 3.4* 3.8 4.2 4.1 4.2  CL 99 100 96* 95* 97*  CO2 32 31 30 29 29   GLUCOSE 157* 174* 173* 184* 174*  BUN 10 14 15 17 16   CREATININE 0.67 0.70 0.75 0.79 0.90  CALCIUM 8.5* 8.6* 8.7* 9.0 8.8*  MG 1.6* 1.6* 1.8 1.4* 1.9   GFR: Estimated Creatinine Clearance: 97.1 mL/min (by C-G formula based on SCr of 0.9 mg/dL). Liver Function Tests: No results for input(s): AST, ALT, ALKPHOS, BILITOT, PROT, ALBUMIN in the last 168 hours.  No results for input(s): LIPASE, AMYLASE in the last 168 hours. No results for input(s): AMMONIA in the last 168 hours. Coagulation Profile: No results for input(s): INR, PROTIME in the last 168 hours.  Cardiac Enzymes: No results for input(s): CKTOTAL, CKMB, CKMBINDEX, TROPONINI in the last 168 hours. BNP (last 3 results) No results for input(s): PROBNP in the last 8760 hours. HbA1C: No results for input(s): HGBA1C in the last 72 hours. CBG: Recent Labs  Lab 10/10/23 0621 10/10/23 1052 10/10/23 1638 10/11/23 0605 10/11/23 0618  GLUCAP 196* 171* 126* 187* 188*   Lipid Profile: No results for input(s): CHOL, HDL, LDLCALC, TRIG, CHOLHDL, LDLDIRECT in the last 72 hours. Thyroid Function Tests: No results for input(s): TSH, T4TOTAL, FREET4, T3FREE, THYROIDAB in the last 72 hours. Anemia Panel: No results for input(s): VITAMINB12, FOLATE, FERRITIN, TIBC, IRON, RETICCTPCT in the last 72 hours. Urine analysis:    Component Value Date/Time   COLORURINE YELLOW 02/24/2023 0518   APPEARANCEUR CLEAR 02/24/2023 0518   APPEARANCEUR Clear 07/16/2018 1126   LABSPEC 1.030 02/24/2023 0518   PHURINE 5.0 02/24/2023 0518   GLUCOSEU >=500 (A) 02/24/2023 0518   HGBUR NEGATIVE 02/24/2023 0518   BILIRUBINUR NEGATIVE 02/24/2023 0518   BILIRUBINUR Negative 07/16/2018 1126    KETONESUR 5 (A) 02/24/2023 0518   PROTEINUR NEGATIVE 02/24/2023 0518   UROBILINOGEN 0.2 01/04/2009 1218   NITRITE NEGATIVE 02/24/2023 0518   LEUKOCYTESUR NEGATIVE 02/24/2023 0518   Sepsis Labs: @LABRCNTIP (procalcitonin:4,lacticidven:4)  )No results found for this or any previous visit (from the past 240 hours).   Radiology Studies: No results found.   Scheduled Meds:  apixaban   5 mg Oral BID   diltiazem   120 mg Oral Once   [START ON 10/12/2023] diltiazem   240 mg Oral Daily   insulin  aspart  0-15 Units Subcutaneous TID WC   insulin  aspart  0-5 Units Subcutaneous QHS   lactulose   20 g Oral BID   lidocaine   1 patch Transdermal Daily   losartan   50 mg Oral Daily   methocarbamol   500 mg Oral TID   metoprolol  tartrate  100 mg Oral BID   pantoprazole   40 mg Oral Daily   potassium chloride   40 mEq Oral TID   sodium chloride  flush  3 mL Intravenous Q12H   spironolactone   12.5 mg Oral Daily   torsemide   20 mg Oral Daily   Continuous Infusions:     LOS: 8 days    Time spent:    Sigurd Pac, MD [facility]   10/11/2023, 10:54 AM

## 2023-10-11 NOTE — Progress Notes (Signed)
 Mobility Specialist Progress Note:    10/11/23 1001  Mobility  Activity Ambulated with assistance in hallway  Level of Assistance Contact guard assist, steadying assist  Assistive Device Front wheel walker  Distance Ambulated (ft) 40 ft  Activity Response Tolerated well  Mobility Referral Yes  Mobility visit 1 Mobility  Mobility Specialist Start Time (ACUTE ONLY) 0934  Mobility Specialist Stop Time (ACUTE ONLY) 0948  Mobility Specialist Time Calculation (min) (ACUTE ONLY) 14 min   Received pt in bed and agreeable to mobility. No physical assistance needed. Pt took x2 seated rest breaks to recover HR. Max HR 146 during activity, no c/o when asked. Pt returned back to room and left in chair with alarm on. Personal belongings and call light within reach. All needs met.   Pre Mobility: HR: 81 During Mobility: max HR: 146 Post Mobility: HR 94  Cal Gindlesperger Mobility Specialist  Please contact via [employer] or  Rehab Office [phone]

## 2023-10-12 ENCOUNTER — Inpatient Hospital Stay (HOSPITAL_COMMUNITY)

## 2023-10-12 ENCOUNTER — Other Ambulatory Visit (HOSPITAL_COMMUNITY): Payer: Self-pay

## 2023-10-12 DIAGNOSIS — I5033 Acute on chronic diastolic (congestive) heart failure: Secondary | ICD-10-CM | POA: Diagnosis not present

## 2023-10-12 DIAGNOSIS — I4821 Permanent atrial fibrillation: Secondary | ICD-10-CM | POA: Diagnosis not present

## 2023-10-12 LAB — BASIC METABOLIC PANEL WITH GFR
Anion gap: 7 (ref 5–15)
BUN: 21 mg/dL (ref 8–23)
CO2: 28 mmol/L (ref 22–32)
Calcium: 9.1 mg/dL (ref 8.9–10.3)
Chloride: 99 mmol/L (ref 98–111)
Creatinine, Ser: 0.92 mg/dL (ref 0.44–1.00)
GFR, Estimated: >60 mL/min (ref >60)
Glucose, Bld: 181 mg/dL — ABNORMAL HIGH (ref 70–99)
Potassium: 4.6 mmol/L (ref 3.5–5.1)
Sodium: 134 mmol/L — ABNORMAL LOW (ref 135–145)

## 2023-10-12 LAB — GLUCOSE, CAPILLARY
Glucose-Capillary: 181 mg/dL — ABNORMAL HIGH (ref 70–99)
Glucose-Capillary: 215 mg/dL — ABNORMAL HIGH (ref 70–99)
Glucose-Capillary: 196 mg/dL — ABNORMAL HIGH (ref 70–99)

## 2023-10-12 LAB — MAGNESIUM: Magnesium: 1.6 mg/dL — ABNORMAL LOW (ref 1.7–2.4)

## 2023-10-12 MED ORDER — METOPROLOL TARTRATE 100 MG PO TABS
100.0000 mg | ORAL_TABLET | Freq: Two times a day (BID) | ORAL | 1 refills | Status: DC
  Filled 2023-10-12: qty 60, 30d supply, fill #0

## 2023-10-12 MED ORDER — HYDROMORPHONE HCL 1 MG/ML IJ SOLN
1.0000 mg | Freq: Once | INTRAMUSCULAR | Status: AC
  Administered 2023-10-12: 1 mg via INTRAVENOUS
  Filled 2023-10-12: qty 1

## 2023-10-12 MED ORDER — IOHEXOL 350 MG/ML SOLN
75.0000 mL | Freq: Once | INTRAVENOUS | Status: AC | PRN
  Administered 2023-10-12: 75 mL via INTRAVENOUS

## 2023-10-12 MED ORDER — METHOCARBAMOL 500 MG PO TABS
500.0000 mg | ORAL_TABLET | Freq: Three times a day (TID) | ORAL | 1 refills | Status: DC
  Filled 2023-10-12: qty 90, 30d supply, fill #0

## 2023-10-12 MED ORDER — HYDROMORPHONE HCL 1 MG/ML IJ SOLN
1.0000 mg | INTRAMUSCULAR | Status: DC | PRN
  Administered 2023-10-12 – 2023-10-17 (×13): 1 mg via INTRAVENOUS
  Filled 2023-10-12 (×13): qty 1

## 2023-10-12 MED ORDER — SPIRONOLACTONE 25 MG PO TABS
12.5000 mg | ORAL_TABLET | Freq: Every day | ORAL | 1 refills | Status: DC
  Filled 2023-10-12: qty 30, 60d supply, fill #0

## 2023-10-12 MED ORDER — TORSEMIDE 20 MG PO TABS
20.0000 mg | ORAL_TABLET | Freq: Every day | ORAL | 1 refills | Status: DC
  Filled 2023-10-12: qty 30, 30d supply, fill #0

## 2023-10-12 MED ORDER — APIXABAN 5 MG PO TABS
5.0000 mg | ORAL_TABLET | Freq: Two times a day (BID) | ORAL | 0 refills | Status: DC
  Filled 2023-10-12: qty 60, 30d supply, fill #0

## 2023-10-12 MED ORDER — LOSARTAN POTASSIUM 50 MG PO TABS
50.0000 mg | ORAL_TABLET | Freq: Every day | ORAL | 1 refills | Status: DC
  Filled 2023-10-12: qty 30, 30d supply, fill #0

## 2023-10-12 MED ORDER — MAGNESIUM SULFATE 2 GM/50ML IV SOLN
2.0000 g | Freq: Once | INTRAVENOUS | Status: AC
  Administered 2023-10-12: 2 g via INTRAVENOUS
  Filled 2023-10-12: qty 50

## 2023-10-12 MED ORDER — LACTULOSE 10 GM/15ML PO SOLN
20.0000 g | Freq: Every day | ORAL | 0 refills | Status: AC
  Filled 2023-10-12: qty 946, 31d supply, fill #0

## 2023-10-12 MED ORDER — OXYCODONE HCL 10 MG PO TABS
10.0000 mg | ORAL_TABLET | Freq: Four times a day (QID) | ORAL | 0 refills | Status: AC | PRN
  Filled 2023-10-12: qty 30, 8d supply, fill #0

## 2023-10-12 MED ORDER — DILTIAZEM HCL ER COATED BEADS 240 MG PO CP24
240.0000 mg | ORAL_CAPSULE | Freq: Every day | ORAL | 1 refills | Status: DC
  Filled 2023-10-12: qty 30, 30d supply, fill #0

## 2023-10-12 NOTE — Progress Notes (Signed)
  Progress Note  Patient Name: Tracie [name] Date of Encounter: 10/12/2023 [facility] Cardiologist: Ozell Fell, MD   Interval Summary   Switched to p.o. torsemide  yesterday, net -900 cc.  -15 L on admission.  Creatinine stable at 0.9.  She denies any dyspnea  Vital Signs Vitals:   10/11/23 1940 10/12/23 0056 10/12/23 0506 10/12/23 0802  BP: 120/76 119/82 (!) 149/81 128/72  Pulse: 76 86 87 89  Resp: 17 18 18 20   Temp: 97.8 F (36.6 C) 98 F (36.7 C) 98.6 F (37 C) 97.8 F (36.6 C)  TempSrc: Oral Oral Oral Oral  SpO2: 92% 96% 93% 94%  Weight:   (!) 142.9 kg   Height:        Intake/Output Summary (Last 24 hours) at 10/12/2023 0940 Last data filed at 10/12/2023 0804 Gross per 24 hour  Intake 363 ml  Output 900 ml  Net -537 ml      10/12/2023    5:06 AM 10/11/2023    5:03 AM 10/10/2023    4:52 AM  Last 3 Weights  Weight (lbs) 315 lb 310 lb 3 oz 314 lb 9.6 oz  Weight (kg) 142.883 kg 140.7 kg 142.702 kg     Telemetry/ECG  Rate controlled atrial fibrillation - Personally Reviewed  Physical Exam  GEN: No acute distress.   Neck: unable to assess JVD Cardiac: irregularly irregular, no murmurs, rubs, or gallops.  Respiratory: Clear to auscultation bilaterally. GI: Soft, nontender, non-distended  MS: no LE edema, much improved   Assessment & Plan  63 year old here with atrial fibrillation rapid ventricular response, hypokalemia, suspected diastolic heart failure exacerbation hypertension type 2 diabetes. Came in with chief complaint of leg swelling shortness of breath and low back pain    Acute on chronic HFpEF Hypertension  Echo: LVEF 60-65%, normal RV function CXR showed vascular congestion BNP 190 Given IV Lasix   Net -15 L this admission  Renal function remains stable  Continue losartan  50 mg daily Continue Lopressor  100 mg BID Continue spironolactone  12.5 mg daily  Continue to monitor and replete electrolytes  Continue strict I&O's, daily weights,  daily BMPs/Mag Continue p.o. torsemide  20 milligrams daily Maintain K greater than 4, mag greater than 2   Atrial fibrillation with RVR  Known history of A. Fib, likely permanent at this point.  Has declined cardioversion Rate controlled Continue Eliquis  5 mg BID Continue diltiazem  120 mg daily Continue Lopressor  100 mg BID    Per primary L1 compression fracture Chronic osteoporosis Diabetes  [facility] will sign off.   Medication Recommendations: Eliquis  5 mg twice daily, diltiazem  240 mg daily, losartan  50 mg daily, Lopressor  100 mg twice daily, spironolactone  12.5 mg daily, torsemide  20 mg daily.  Will need magnesium  supplement Other recommendations (labs, testing, etc): BMET/mag in 1 week Follow up as an outpatient: Scheduled for 7/17  For questions or updates, please contact [facility] Please consult [email or web address] for contact info under       Signed, Lonni LITTIE Nanas, MD

## 2023-10-12 NOTE — TOC Transition Note (Signed)
 Transition of Care [facility]) - Discharge Note   Patient Details  Name: Tracie [name] MRN: [id number] Date of Birth: 09-13-1960  Transition of Care [facility]) CM/SW Contact:  Marval Gell, RN Phone Number: 10/12/2023, 10:36 AM   Clinical Narrative:      DC to home, notified [facility] of DC. No other TOC needs identified    Final next level of care: Home w Home Health Services     Patient Goals and CMS Choice            Discharge Placement                       Discharge Plan and Services Additional resources added to the After Visit Summary for                              [facility] Agency: [facility] Date [facility] Agency Contacted: 10/12/23 Time HH Agency Contacted: 1036 Representative spoke with at [facility] Agency: Darleene  Social Drivers of Health (SDOH) Interventions SDOH Screenings   Food Insecurity: No Food Insecurity (10/03/2023)  Housing: Low Risk  (10/03/2023)  Transportation Needs: No Transportation Needs (10/03/2023)  Utilities: Not At Risk (10/03/2023)  Depression (PHQ2-9): Low Risk  (02/26/2020)  Tobacco Use: Medium Risk (10/03/2023)     Readmission Risk Interventions    10/08/2023    2:56 PM  Readmission Risk Prevention Plan  Post Dischage Appt Complete  Medication Screening Complete  Transportation Screening Complete

## 2023-10-12 NOTE — Discharge Summary (Signed)
 Physician Discharge Summary  Tracie [name] FMW:[id number] DOB: 11/26/1960 DOA: 10/02/2023  PCP: Rolinda Millman, MD  Admit date: 10/02/2023 Discharge date: 10/12/2023  Time spent:  Recommendations for Outpatient Follow-up:  Follow-up with [facility] heart care on 7/17, please check BMP and mag in 1 week Needs sleep study as outpatient PCP in 1 week   Discharge Diagnoses:  Principal Problem:   Acute on chronic heart failure with preserved ejection fraction (HFpEF) (HCC) Active Problems:   Atrial fibrillation with rapid ventricular response (HCC)   Lumbar pain L1 compression fracture   Hypokalemia   Controlled type 2 diabetes mellitus without complication, without long-term current use of insulin  (HCC)   SOB (shortness of breath)   Persistent atrial fibrillation (HCC)   Lumbar burst fracture (HCC)   Acute on chronic diastolic CHF (congestive heart failure) (HCC)   Permanent atrial fibrillation (HCC)   Chronic a-fib (HCC)   Discharge Condition: Improved  Diet recommendation: Sodium, heart healthy  Filed Weights   10/10/23 0452 10/11/23 0503 10/12/23 0506  Weight: (!) 142.7 kg (!) 140.7 kg (!) 142.9 kg    History of present illness:  63 y.o. female with medical history significant for hypertension, type 2 diabetes mellitus, history of DVT, PAF on Eliquis , chronic HFpEF, and chronic low back pain who presents with worsening bilateral lower extremity swelling, shortness of breath, and back pain.  Patient is admitted to the hospitalist service for further management evaluation of heart failure exacerbation, A-fib RVR, severe back pain. Patient found to have L1-L2 fracture on x-ray, neurosurgery consulted.  Cardiology consulted for CHF exacerbation, A-fib with RVR.  Patient is started on IV Lasix , beta-blocker therapy and Eliquis .  She seems to be noncompliant with her medications  Hospital Course:   Persistent A-fib with RVR: Cardiology consult appreciated.,  Suspected to  be permanent A-fib at this time, declined cardioversion Continue Cardizem , Lopressor  and Eliquis    Acute on chronic diastolic heart failure: Recent EF 60-65%.,  Normal RV -Improving with diuresis, 14 L negative, switched to oral torsemide  -Cards following, continue Lopressor , losartan , Aldactone   -poor candidate for SGLT2i - Discharged home with home health services, follow-up with [facility] heart care on 7/17   Hypokalemia Hypomagnesemia. Replaced.   L1 compression fracture- H/o kyphoplasty 02/2023 Chronic osteoporosis as well as arthritis. MRI lumbar spine reviewed shows interval development of worsening central spinal canal stenosis at L1 secondary to compression fracture, interval development of superior endplate compression deformity of L2. Seen by neurosurgery-advised conservative management due to her morbid obesity, anatomy, cardiac risk factors, she is not a surgical candidate. -Robaxin , lidocaine  patch, oxycodone  - Discontinued Celebrex  with ongoing ARB use   Morbid obesity Body mass index is 46.06 kg/m.  Complicating further her current condition. Diet, exercise and weight reduction advised. Concern for obesity hypoventilation syndrome is also high.   Recommended patient to have outpatient sleep study for further evaluation.   Type 2 diabetes mellitus.  Well-controlled without long-term insulin  use without complication. Hemoglobin A1c 6.2.  In the past hemoglobin A1c has been at 7.6. On glipizide  2.5 mg daily.  Currently on sliding scale insulin  continue moderate scale  Discharge Exam: Vitals:   10/12/23 0506 10/12/23 0802  BP: (!) 149/81 128/72  Pulse: 87 89  Resp: 18 20  Temp: 98.6 F (37 C) 97.8 F (36.6 C)  SpO2: 93% 94%   General exam: Morbidly obese chronically ill female sitting up in bed, AAO x 3 HEENT: Neck obese unable to assess JVD CVS: S1-S2, regular rhythm Lungs:  Decreased breath sounds to bases Abdomen: Soft, nontender, bowel sounds  present Remedies: Trace edema Skin: No rashes Psychiatry:  Mood & affect appropriate.   Discharge Instructions   Discharge Instructions     Diet - low sodium heart healthy   Complete by: As directed    Diet Carb Modified   Complete by: As directed    Increase activity slowly   Complete by: As directed       Allergies as of 10/12/2023       Reactions   Codeine Other (See Comments)   Unknown reaction   Crestor [rosuvastatin] Other (See Comments)   Myalgias  Weakness        Medication List     STOP taking these medications    amLODipine  10 MG tablet Commonly known as: NORVASC    celecoxib  100 MG capsule Commonly known as: CELEBREX    empagliflozin  25 MG Tabs tablet Commonly known as: JARDIANCE    metoprolol  succinate 50 MG 24 hr tablet Commonly known as: TOPROL -XL       TAKE these medications    apixaban  5 MG Tabs tablet Commonly known as: ELIQUIS  Take 1 tablet (5 mg total) by mouth 2 (two) times daily.   aspirin  EC 81 MG tablet Take 81 mg by mouth daily. Swallow whole.   diltiazem  240 MG 24 hr capsule Commonly known as: CARDIZEM  CD Take 1 capsule (240 mg total) by mouth daily. What changed:  medication strength how much to take   glipiZIDE  2.5 MG Tabs Take 2.5 mg by mouth daily.   lactulose  10 GM/15ML solution Commonly known as: CHRONULAC  Take 30 mLs (20 g total) by mouth daily.   losartan  50 MG tablet Commonly known as: COZAAR  Take 1 tablet (50 mg total) by mouth daily. TAKE 1 TABLET(100 MG) BY MOUTH DAILY What changed:  medication strength how much to take how to take this when to take this   methocarbamol  500 MG tablet Commonly known as: ROBAXIN  Take 1 tablet (500 mg total) by mouth 3 (three) times daily. What changed:  medication strength how much to take when to take this   metoprolol  tartrate 100 MG tablet Commonly known as: LOPRESSOR  Take 1 tablet (100 mg total) by mouth 2 (two) times daily.   Oxycodone  HCl 10 MG  Tabs Take 1 tablet (10 mg total) by mouth every 6 (six) hours as needed for severe pain (pain score 7-10).   spironolactone  25 MG tablet Commonly known as: ALDACTONE  Take 0.5 tablets (12.5 mg total) by mouth daily.   torsemide  20 MG tablet Commonly known as: DEMADEX  Take 1 tablet (20 mg total) by mouth daily.   vitamin B-12 500 MCG tablet Commonly known as: CYANOCOBALAMIN Take 500 mcg by mouth daily.       Allergies  Allergen Reactions   Codeine Other (See Comments)    Unknown reaction   Crestor [Rosuvastatin] Other (See Comments)    Myalgias  Weakness    Follow-up Information     [facility], [facility].   Specialty: Home Health Services Why: Agency will call you to set up apt time Contact information: [address] [place] [address] [phone]                  The results of significant diagnostics from this hospitalization (including imaging, microbiology, ancillary and laboratory) are listed below for reference.    Significant Diagnostic Studies: ECHOCARDIOGRAM COMPLETE Result Date: 10/04/2023    ECHOCARDIOGRAM REPORT   Patient Name:  Tracie [name] Date of Exam: 10/04/2023 Medical Rec #:  [id number]           Height:       70.5 in Accession #:    [id number]          Weight:       330.5 lb Date of Birth:  03/31/1961           BSA:          2.598 m Patient Age:    62 years            BP:           135/81 mmHg Patient Gender: F                   HR:           100 bpm. Exam Location:  Inpatient Procedure: 2D Echo, Cardiac Doppler and Color Doppler (Both Spectral and Color            Flow Doppler were utilized during procedure). Indications:    CHF I50.31 Afib I48.91  History:        Patient has prior history of Echocardiogram examinations, most                 recent 02/25/2023.  Sonographer:    Tinnie Gosling RDCS Referring Phys: [id number] LOGAN N LOCKWOOD IMPRESSIONS  1. Left ventricular ejection fraction, by estimation, is 60 to 65%.  The left ventricle has normal function. Left ventricular endocardial border not optimally defined to evaluate regional wall motion. There is mild concentric left ventricular hypertrophy. Left ventricular diastolic function could not be evaluated.  2. Right ventricular systolic function is normal. The right ventricular size is normal.  3. Left atrial size was mild to moderately dilated.  4. Right atrial size was mild to moderately dilated.  5. The mitral valve is normal in structure. No evidence of mitral valve regurgitation. No evidence of mitral stenosis.  6. The aortic valve was not well visualized. There is mild calcification of the aortic valve. Aortic valve regurgitation is not visualized. Aortic valve sclerosis/calcification is present, without any evidence of aortic stenosis.  7. The inferior vena cava is normal in size with greater than 50% respiratory variability, suggesting right atrial pressure of 3 mmHg. Conclusion(s)/Recommendation(s): Very poor image quality due to poor sound wave transmission. LV endocardium never seen well. FINDINGS  Left Ventricle: Left ventricular ejection fraction, by estimation, is 60 to 65%. The left ventricle has normal function. Left ventricular endocardial border not optimally defined to evaluate regional wall motion. The left ventricular internal cavity size was normal in size. There is mild concentric left ventricular hypertrophy. Left ventricular diastolic function could not be evaluated due to atrial fibrillation. Left ventricular diastolic function could not be evaluated. Right Ventricle: The right ventricular size is normal. No increase in right ventricular wall thickness. Right ventricular systolic function is normal. Left Atrium: Left atrial size was mild to moderately dilated. Right Atrium: Right atrial size was mild to moderately dilated. Pericardium: There is no evidence of pericardial effusion. Mitral Valve: The mitral valve is normal in structure. No evidence of  mitral valve regurgitation. No evidence of mitral valve stenosis. Tricuspid Valve: The tricuspid valve is not well visualized. Tricuspid valve regurgitation is trivial. No evidence of tricuspid stenosis. Aortic Valve: The aortic valve was not well visualized. There is mild calcification of the aortic valve. Aortic valve regurgitation is not visualized. Aortic valve sclerosis/calcification is present, without any  evidence of aortic stenosis. Pulmonic Valve: The pulmonic valve was not well visualized. Pulmonic valve regurgitation is trivial. No evidence of pulmonic stenosis. Aorta: The aortic root is normal in size and structure. Venous: The inferior vena cava is normal in size with greater than 50% respiratory variability, suggesting right atrial pressure of 3 mmHg. IAS/Shunts: No atrial level shunt detected by color flow Doppler.  LEFT VENTRICLE PLAX 2D LVIDd:         4.60 cm LVIDs:         3.30 cm LV PW:         1.30 cm LV IVS:        1.20 cm LVOT diam:     2.30 cm LV SV:         51 LV SV Index:   20 LVOT Area:     4.15 cm  IVC IVC diam: 2.30 cm LEFT ATRIUM           Index LA diam:      4.50 cm 1.73 cm/m LA Vol (A4C): 84.4 ml 32.49 ml/m  AORTIC VALVE LVOT Vmax:   67.10 cm/s LVOT Vmean:  44.600 cm/s LVOT VTI:    0.122 m  AORTA Ao Root diam: 3.20 cm Ao Asc diam:  3.70 cm  SHUNTS Systemic VTI:  0.12 m Systemic Diam: 2.30 cm Toribio Fuel MD Electronically signed by Toribio Fuel MD Signature Date/Time: 10/04/2023/8:59:01 AM    Final    VAS US  LOWER EXTREMITY VENOUS (DVT) Result Date: 10/03/2023  Lower Venous DVT Study Patient Name:  Tracie [name]  Date of Exam:   10/03/2023 Medical Rec #: [id number]            Accession #:    [id number] Date of Birth: June 08, 1960            Patient Gender: F Patient Age:   65 years Exam Location:  [facility] Procedure:      VAS US  LOWER EXTREMITY VENOUS (DVT) Referring Phys: TIMOTHY OPYD  --------------------------------------------------------------------------------  Indications: Swelling, and Edema.  Risk Factors: DVT Hx of DVT. Limitations: Body habitus and Pt is unable to tolerate compression in groin and thigh area therefore the contralateral groin is not assessed. Pt screams loudly, tenses and holds breath. Performing Technologist: Elmarie Lindau, RVT  Examination Guidelines: A complete evaluation includes B-mode imaging, spectral Doppler, color Doppler, and power Doppler as needed of all accessible portions of each vessel. Bilateral testing is considered an integral part of a complete examination. Limited examinations for reoccurring indications may be performed as noted. The reflux portion of the exam is performed with the patient in reverse Trendelenburg.  +---------+---------------+---------+-----------+----------+---------------+ RIGHT    CompressibilityPhasicitySpontaneityPropertiesThrombus Aging  +---------+---------------+---------+-----------+----------+---------------+ CFV      Full           Yes      Yes                                  +---------+---------------+---------+-----------+----------+---------------+ SFJ      Full                                                         +---------+---------------+---------+-----------+----------+---------------+ FV Prox  Full                                                         +---------+---------------+---------+-----------+----------+---------------+  FV Mid                  Yes                           full color flow +---------+---------------+---------+-----------+----------+---------------+ FV Distal               Yes                           full color flow +---------+---------------+---------+-----------+----------+---------------+ PFV      Full                                                         +---------+---------------+---------+-----------+----------+---------------+  POP      Full           Yes      Yes                                  +---------+---------------+---------+-----------+----------+---------------+ PTV      Full                                                         +---------+---------------+---------+-----------+----------+---------------+ PERO     Full                                                         +---------+---------------+---------+-----------+----------+---------------+        Summary: RIGHT: - There is no evidence of deep vein thrombosis in the lower extremity.  - No cystic structure found in the popliteal fossa.   *See table(s) above for measurements and observations. Electronically signed by Debby Robertson on 10/03/2023 at 9:39:31 PM.    Final    MR LUMBAR SPINE WO CONTRAST Result Date: 10/03/2023 CLINICAL DATA:  Low back pain, prior surgery, new symptoms EXAM: MRI LUMBAR SPINE WITHOUT CONTRAST TECHNIQUE: Multiplanar, multisequence MR imaging of the lumbar spine was performed. No intravenous contrast was administered. COMPARISON:  Plain radiographs the lumbar spine dated October 03, 2023 and MRI of the lumbar spine dated February 26, 2023. FINDINGS: Segmentation:  Standard Alignment: Focal kyphosis at L1 secondary to marked vertebral compression deformity status post recent kyphoplasty. Vertebrae: Since the previous MRI, the patient has undergone bilateral kyphoplasty at L1. There has also been significant interval worsening of the compression fracture and worsening retropulsion of the posterior wall of the vertebral body, which now protrudes approximately 9 mm posteriorly, resulting in moderate central spinal canal stenosis, which is worse on the left. There has also been interval development of a superior endplate compression deformity of L2, which appears to be chronic as there is no increased signal present on the STIR sequence. There are hemangiomas present within the L3 and L4 vertebrae. Conus medullaris and cauda equina:  Conus extends to the midbody of L1 level. Conus and cauda equina appear normal.  Paraspinal and other soft tissues: There is a simple appearing cyst again seen arising posteriorly from the left kidney. No follow-up is necessary. The paraspinous soft tissues are otherwise unremarkable. Disc levels: T12-L1: There is a new small right paracentral protrusion seen best on the sagittal sequences, image 8. There is mild right-sided central spinal canal stenosis. The neural foramina are widely patent. L1-2: There is moderate central spinal canal stenosis secondary to retropulsion of the posteroinferior corner of the vertebral body. There is no impingement upon the conus medullaris or cauda equina. The neural foramina are patent. L2-3: Mild diffuse disc bulging and mild bilateral facet arthrosis with mild central spinal canal stenosis. The neural foramina are widely patent. L3-4: Normal. L4-5: Mild diffuse disc bulging, endplate ridging and facet hypertrophy, with mild central spinal canal stenosis and mild bilateral lateral recess stenosis. No apparent nerve root impingement. L5-S1: Normal. IMPRESSION: 1. Interval development of worsening central spinal canal stenosis at L1 secondary to interval worsening of a compression fracture at L1 and interval development of a superior endplate compression deformity of L2, which has lost approximately 20% of its height anteriorly. There is moderate focal kyphosis as a result. Electronically Signed   By: Evalene Coho M.D.   On: 10/03/2023 12:51   DG Lumbar Spine 2-3 Views Result Date: 10/03/2023 EXAM: 2 or 3 VIEW(S) XRAY OF THE LUMBAR SPINE 10/03/2023 04:26:00 AM COMPARISON: Lumbar spine radiographs. CLINICAL HISTORY: Pain, best images obtained due to patient condition and body habitus. FINDINGS: LUMBAR SPINE: BONES: Spinal augmentation is present at L1. The L1 fracture demonstrates progressive loss of height since the prior exam. Methyl methacrylate is present outside of the  vertebral body. A superior endplate fracture at L2 is new since the prior exam. Exaggerated kyphosis is present. DISCS AND DEGENERATIVE CHANGES: No severe degenerative changes. The discs are maintained. SOFT TISSUES: No acute abnormality. VASCULATURE: Atherosclerotic calcifications are present in the aorta. IMPRESSION: 1. Progressive loss of height of the L1 fracture with methyl methacrylate present outside of the vertebral body. 2. New superior endplate fracture at L2. 3. Exaggerated kyphosis. Electronically signed by: Lonni Necessary MD 10/03/2023 05:06 AM EDT RP Workstation: [id number]   DG Chest 2 View Result Date: 10/02/2023 CLINICAL DATA:  Shortness of breath EXAM: CHEST - 2 VIEW COMPARISON:  02/24/2023 FINDINGS: Cardiomegaly, vascular congestion. No overt edema, confluent opacities or effusions. No acute bony abnormality. IMPRESSION: Cardiomegaly, vascular congestion. Electronically Signed   By: Franky Crease M.D.   On: 10/02/2023 18:26    Microbiology: No results found for this or any previous visit (from the past 240 hours).   Labs: Basic Metabolic Panel: Recent Labs  Lab 10/08/23 0159 10/09/23 0235 10/10/23 0226 10/11/23 0241 10/12/23 0222  NA 138 135 135 134* 134*  K 3.8 4.2 4.1 4.2 4.6  CL 100 96* 95* 97* 99  CO2 31 30 29 29 28   GLUCOSE 174* 173* 184* 174* 181*  BUN 14 15 17 16 21   CREATININE 0.70 0.75 0.79 0.90 0.92  CALCIUM 8.6* 8.7* 9.0 8.8* 9.1  MG 1.6* 1.8 1.4* 1.9 1.6*   Liver Function Tests: No results for input(s): AST, ALT, ALKPHOS, BILITOT, PROT, ALBUMIN in the last 168 hours. No results for input(s): LIPASE, AMYLASE in the last 168 hours. No results for input(s): AMMONIA in the last 168 hours. CBC: Recent Labs  Lab 10/06/23 0438 10/10/23 0226  WBC 10.0 8.9  HGB 13.7 15.2*  HCT 43.5 48.4*  MCV 88.8 87.8  PLT 246 294   Cardiac  Enzymes: No results for input(s): CKTOTAL, CKMB, CKMBINDEX, TROPONINI in the last 168  hours. BNP: BNP (last 3 results) Recent Labs    02/24/23 0548 10/02/23 1730  BNP 135.6* 189.8*    ProBNP (last 3 results) No results for input(s): PROBNP in the last 8760 hours.  CBG: Recent Labs  Lab 10/11/23 0618 10/11/23 1128 10/11/23 1608 10/11/23 2107 10/12/23 0635  GLUCAP 188* 201* 155* 177* 181*       Signed:  Sigurd Pac MD.  [facility] 10/12/2023, 10:10 AM

## 2023-10-13 DIAGNOSIS — I4821 Permanent atrial fibrillation: Secondary | ICD-10-CM | POA: Diagnosis not present

## 2023-10-13 LAB — CBC
HCT: 45.4 % (ref 36.0–46.0)
Hemoglobin: 14.3 g/dL (ref 12.0–15.0)
MCH: 28 pg (ref 26.0–34.0)
MCHC: 31.5 g/dL (ref 30.0–36.0)
MCV: 89 fL (ref 80.0–100.0)
Platelets: 268 K/uL (ref 150–400)
RBC: 5.1 MIL/uL (ref 3.87–5.11)
RDW: 14.1 % (ref 11.5–15.5)
WBC: 8.4 K/uL (ref 4.0–10.5)
nRBC: 0 % (ref 0.0–0.2)

## 2023-10-13 LAB — MAGNESIUM: Magnesium: 1.8 mg/dL (ref 1.7–2.4)

## 2023-10-13 LAB — GLUCOSE, CAPILLARY
Glucose-Capillary: 189 mg/dL — ABNORMAL HIGH (ref 70–99)
Glucose-Capillary: 213 mg/dL — ABNORMAL HIGH (ref 70–99)
Glucose-Capillary: 198 mg/dL — ABNORMAL HIGH (ref 70–99)
Glucose-Capillary: 328 mg/dL — ABNORMAL HIGH (ref 70–99)

## 2023-10-13 LAB — BASIC METABOLIC PANEL WITH GFR
Anion gap: 8 (ref 5–15)
BUN: 18 mg/dL (ref 8–23)
CO2: 27 mmol/L (ref 22–32)
Calcium: 9.2 mg/dL (ref 8.9–10.3)
Chloride: 100 mmol/L (ref 98–111)
Creatinine, Ser: 1.03 mg/dL — ABNORMAL HIGH (ref 0.44–1.00)
GFR, Estimated: >60 mL/min (ref >60)
Glucose, Bld: 194 mg/dL — ABNORMAL HIGH (ref 70–99)
Potassium: 4.4 mmol/L (ref 3.5–5.1)
Sodium: 135 mmol/L (ref 135–145)

## 2023-10-13 MED ORDER — DEXAMETHASONE 4 MG PO TABS
4.0000 mg | ORAL_TABLET | Freq: Two times a day (BID) | ORAL | Status: AC
  Administered 2023-10-13 – 2023-10-14 (×3): 4 mg via ORAL
  Filled 2023-10-13 (×3): qty 1

## 2023-10-13 MED ORDER — TORSEMIDE 20 MG PO TABS
40.0000 mg | ORAL_TABLET | Freq: Every day | ORAL | Status: DC
  Administered 2023-10-13 – 2023-10-17 (×5): 40 mg via ORAL
  Filled 2023-10-13 (×5): qty 2

## 2023-10-13 MED ORDER — LIDOCAINE 5 % EX PTCH
1.0000 | MEDICATED_PATCH | CUTANEOUS | Status: DC
  Administered 2023-10-13 – 2023-10-17 (×5): 1 via TRANSDERMAL
  Filled 2023-10-13 (×5): qty 1

## 2023-10-13 MED ORDER — INSULIN GLARGINE-YFGN 100 UNIT/ML ~~LOC~~ SOLN
10.0000 [IU] | Freq: Every day | SUBCUTANEOUS | Status: DC
  Administered 2023-10-13 – 2023-10-14 (×2): 10 [IU] via SUBCUTANEOUS
  Filled 2023-10-13 (×3): qty 0.1

## 2023-10-13 MED ORDER — POLYETHYLENE GLYCOL 3350 17 G PO PACK
17.0000 g | PACK | Freq: Two times a day (BID) | ORAL | Status: DC
  Administered 2023-10-14 – 2023-10-15 (×3): 17 g via ORAL
  Filled 2023-10-13 (×6): qty 1

## 2023-10-13 NOTE — Progress Notes (Signed)
 PROGRESS NOTE    Tracie [name]  FMW:[id number] DOB: 1960-11-29 DOA: 10/02/2023 PCP: Rolinda Millman, MD  63 y.o. female with medical history significant for hypertension, type 2 diabetes mellitus, history of DVT, PAF on Eliquis , chronic HFpEF, and chronic low back pain who presents with worsening bilateral lower extremity swelling, shortness of breath, and back pain.  Patient is admitted to the hospitalist service for further management evaluation of heart failure exacerbation, A-fib RVR, severe back pain. Patient found to have L1-L2 fracture on x-ray, neurosurgery consulted.  Cardiology consulted for CHF exacerbation, A-fib with RVR.  Patient is started on IV Lasix , beta-blocker therapy and Eliquis .  She seems to be noncompliant with her medications. - Improved with diuresis, supportive care and pain meds for her low back, followed by cardiology -Eventually discharged 7/5 AM -Subsequently 7/5 early afternoon prior to discharge started having severe left lateral wall pleuritic chest pain, urgent CTA chest negative for PE   Subjective: - Still continues to have left lateral chest wall pain, worse with inspiration  Assessment and Plan:  Persistent A-fib with RVR: Cardiology consult appreciated.,  Suspected to be permanent A-fib at this time, declined cardioversion Continue Cardizem , Lopressor  and Eliquis    Acute on chronic diastolic heart failure: Recent EF 60-65%.,  Normal RV -Improving with diuresis, 15 L negative, switched to oral torsemide  yesterday -Cards following, continue Lopressor , losartan , Aldactone   -poor candidate for SGLT2i - CTA with some residual pulmonary edema, increased torsemide  dose   Hypokalemia Hypomagnesemia. Replaced.   L1 compression fracture- H/o kyphoplasty 02/2023 Chronic osteoporosis as well as arthritis. MRI lumbar spine reviewed shows interval development of worsening central spinal canal stenosis at L1 secondary to compression fracture, interval  development of superior endplate compression deformity of L2. Seen by neurosurgery-advised conservative management due to her morbid obesity, anatomy, cardiac risk factors, she is not a surgical candidate. -Robaxin , lidocaine  patch, Dilaudid  for severe pain - Discontinued Celebrex  with ongoing ARB use Recommend outpatient follow-up with orthopedics as well for arthritis management.  Left lateral pleuritic chest pain in mid axillary line -CTA chest negative for PE, was to be musculoskeletal, tenderness noted as well -Some of it could be radiation from L1-L2 fracture -Narcotics as above, add Decadron  X 2 days, lidocaine  patch   Morbid obesity Body mass index is 46.06 kg/m.  Complicating further her current condition. Diet, exercise and weight reduction advised. Concern for obesity hypoventilation syndrome is also high.   Recommended patient to have outpatient sleep study for further evaluation.   Type 2 diabetes mellitus.  Well-controlled without long-term insulin  use without complication. Hemoglobin A1c 6.2.  In the past hemoglobin A1c has been at 7.6. On glipizide  2.5 mg daily.  -Add Semglee  with addition of Decadron    DVT prophylaxis: Eliquis  Code Status: Full code Family Communication: None present Disposition Plan: Home likely tomorrow with home health services  Consultants: Cards   Procedures:   Antimicrobials:    Objective: Vitals:   10/12/23 2336 10/13/23 0300 10/13/23 0316 10/13/23 0724  BP: 107/86  129/87 (!) 140/83  Pulse:    86  Resp: 18  18 19   Temp: 97.8 F (36.6 C)  97.8 F (36.6 C) (!) 97.5 F (36.4 C)  TempSrc: Oral  Oral Oral  SpO2: 93% 94% 92% 94%  Weight:      Height:        Intake/Output Summary (Last 24 hours) at 10/13/2023 1017 Last data filed at 10/12/2023 1300 Gross per 24 hour  Intake 240 ml  Output 300 ml  Net -60 ml   Filed Weights   10/10/23 0452 10/11/23 0503 10/12/23 0506  Weight: (!) 142.7 kg (!) 140.7 kg (!) 142.9 kg     Examination:  General exam: Morbidly obese chronically ill female sitting up in bed, AAO x 3 HEENT: Neck obese unable to assess JVD CVS: S1-S2, regular rhythm Lungs: Decreased breath sounds at the bases Abdomen: Soft, nontender, bowel sounds present Extremities: Trace edema Skin: No rashes Psychiatry:  Mood & affect appropriate.     Data Reviewed:   CBC: Recent Labs  Lab 10/10/23 0226 10/13/23 0735  WBC 8.9 8.4  HGB 15.2* 14.3  HCT 48.4* 45.4  MCV 87.8 89.0  PLT 294 268   Basic Metabolic Panel: Recent Labs  Lab 10/09/23 0235 10/10/23 0226 10/11/23 0241 10/12/23 0222 10/13/23 0314  NA 135 135 134* 134* 135  K 4.2 4.1 4.2 4.6 4.4  CL 96* 95* 97* 99 100  CO2 30 29 29 28 27   GLUCOSE 173* 184* 174* 181* 194*  BUN 15 17 16 21 18   CREATININE 0.75 0.79 0.90 0.92 1.03*  CALCIUM 8.7* 9.0 8.8* 9.1 9.2  MG 1.8 1.4* 1.9 1.6* 1.8   GFR: Estimated Creatinine Clearance: 85.6 mL/min (A) (by C-G formula based on SCr of 1.03 mg/dL (H)). Liver Function Tests: No results for input(s): AST, ALT, ALKPHOS, BILITOT, PROT, ALBUMIN in the last 168 hours.  No results for input(s): LIPASE, AMYLASE in the last 168 hours. No results for input(s): AMMONIA in the last 168 hours. Coagulation Profile: No results for input(s): INR, PROTIME in the last 168 hours.  Cardiac Enzymes: No results for input(s): CKTOTAL, CKMB, CKMBINDEX, TROPONINI in the last 168 hours. BNP (last 3 results) No results for input(s): PROBNP in the last 8760 hours. HbA1C: No results for input(s): HGBA1C in the last 72 hours. CBG: Recent Labs  Lab 10/11/23 2107 10/12/23 0635 10/12/23 1028 10/12/23 1541 10/13/23 0623  GLUCAP 177* 181* 215* 196* 189*   Lipid Profile: No results for input(s): CHOL, HDL, LDLCALC, TRIG, CHOLHDL, LDLDIRECT in the last 72 hours. Thyroid Function Tests: No results for input(s): TSH, T4TOTAL, FREET4, T3FREE, THYROIDAB in  the last 72 hours. Anemia Panel: No results for input(s): VITAMINB12, FOLATE, FERRITIN, TIBC, IRON, RETICCTPCT in the last 72 hours. Urine analysis:    Component Value Date/Time   COLORURINE YELLOW 02/24/2023 0518   APPEARANCEUR CLEAR 02/24/2023 0518   APPEARANCEUR Clear 07/16/2018 1126   LABSPEC 1.030 02/24/2023 0518   PHURINE 5.0 02/24/2023 0518   GLUCOSEU >=500 (A) 02/24/2023 0518   HGBUR NEGATIVE 02/24/2023 0518   BILIRUBINUR NEGATIVE 02/24/2023 0518   BILIRUBINUR Negative 07/16/2018 1126   KETONESUR 5 (A) 02/24/2023 0518   PROTEINUR NEGATIVE 02/24/2023 0518   UROBILINOGEN 0.2 01/04/2009 1218   NITRITE NEGATIVE 02/24/2023 0518   LEUKOCYTESUR NEGATIVE 02/24/2023 0518   Sepsis Labs: @LABRCNTIP (procalcitonin:4,lacticidven:4)  )No results found for this or any previous visit (from the past 240 hours).   Radiology Studies: CT Angio Chest Pulmonary Embolism (PE) W or WO Contrast Result Date: 10/12/2023 CLINICAL DATA:  Pulmonary embolus suspected with low to intermediate probability. Negative D-dimer. Leg swelling. EXAM: CT ANGIOGRAPHY CHEST WITH CONTRAST TECHNIQUE: Multidetector CT imaging of the chest was performed using the standard protocol during bolus administration of intravenous contrast. Multiplanar CT image reconstructions and MIPs were obtained to evaluate the vascular anatomy. RADIATION DOSE REDUCTION: This exam was performed according to the departmental dose-optimization program which includes automated exposure control, adjustment of the mA and/or kV according to  patient size and/or use of iterative reconstruction technique. CONTRAST:  75mL OMNIPAQUE  IOHEXOL  350 MG/ML SOLN COMPARISON:  Chest radiograph 10/02/2023 and 02/24/2023 FINDINGS: Cardiovascular: Technically adequate study with good opacification of the central and segmental pulmonary arteries. No focal filling defects. No evidence of significant pulmonary embolus. Cardiac enlargement. Small pericardial  effusions. Ascending aortic aneurysm measuring 4.9 cm diameter. No aortic dissection. Scattered aortic calcification. Scattered coronary artery calcification. Mediastinum/Nodes: Esophagus is decompressed. No significant lymphadenopathy. Thyroid gland is unremarkable. Lungs/Pleura: Motion artifact limits examination. Patchy airspace infiltrates are suggested in the lung bases, possibly edema. Pneumonia less likely. No pleural effusion or pneumothorax. Upper Abdomen: No acute abnormalities. Musculoskeletal: Degenerative changes in the spine. Old compression deformity of T12 post kyphoplasty. No acute bony abnormalities. Review of the MIP images confirms the above findings. IMPRESSION: 1. No evidence of significant pulmonary embolus. 2. Cardiac enlargement with small pericardial effusion. 3. Patchy airspace changes in the lungs most likely edema. Pneumonia less likely. 4. 4.9 cm diameter ascending thoracic aortic aneurysm. Ascending thoracic aortic aneurysm. Recommend semi-annual imaging followup by CTA or MRA and referral to cardiothoracic surgery if not already obtained. This recommendation follows 2010 ACCF/AHA/AATS/ACR/ASA/SCA/SCAI/SIR/STS/SVM Guidelines for the Diagnosis and Management of Patients With Thoracic Aortic Disease. Circulation. 2010; 121: Z733-z630. Aortic aneurysm NOS (ICD10-I71.9) Electronically Signed   By: Elsie Gravely M.D.   On: 10/12/2023 20:24     Scheduled Meds:  apixaban   5 mg Oral BID   dexamethasone   4 mg Oral Q12H   diltiazem   240 mg Oral Daily   insulin  aspart  0-15 Units Subcutaneous TID WC   insulin  aspart  0-5 Units Subcutaneous QHS   insulin  glargine-yfgn  10 Units Subcutaneous Daily   lactulose   20 g Oral BID   lidocaine   1 patch Transdermal Q24H   losartan   50 mg Oral Daily   methocarbamol   500 mg Oral TID   metoprolol  tartrate  100 mg Oral BID   pantoprazole   40 mg Oral Daily   polyethylene glycol  17 g Oral BID   potassium chloride   40 mEq Oral TID   sodium  chloride flush  3 mL Intravenous Q12H   spironolactone   12.5 mg Oral Daily   torsemide   40 mg Oral Daily   Continuous Infusions:     LOS: 10 days    Time spent:    Sigurd Pac, MD [facility]   10/13/2023, 10:17 AM

## 2023-10-13 NOTE — Plan of Care (Signed)
  Problem: Education: Goal: Knowledge of General Education information will improve Description: Including pain rating scale, medication(s)/side effects and non-pharmacologic comfort measures Outcome: Progressing   Problem: Health Behavior/Discharge Planning: Goal: Ability to manage health-related needs will improve Outcome: Progressing   Problem: Pain Managment: Goal: General experience of comfort will improve and/or be controlled Outcome: Progressing

## 2023-10-13 NOTE — Plan of Care (Signed)
   Problem: Education: Goal: Ability to describe self-care measures that may prevent or decrease complications (Diabetes Survival Skills Education) will improve Outcome: Progressing

## 2023-10-14 ENCOUNTER — Other Ambulatory Visit (HOSPITAL_COMMUNITY): Payer: Self-pay

## 2023-10-14 ENCOUNTER — Inpatient Hospital Stay (HOSPITAL_COMMUNITY)

## 2023-10-14 DIAGNOSIS — N281 Cyst of kidney, acquired: Secondary | ICD-10-CM | POA: Diagnosis not present

## 2023-10-14 DIAGNOSIS — I4821 Permanent atrial fibrillation: Secondary | ICD-10-CM | POA: Diagnosis not present

## 2023-10-14 DIAGNOSIS — D1809 Hemangioma of other sites: Secondary | ICD-10-CM | POA: Diagnosis not present

## 2023-10-14 DIAGNOSIS — M545 Low back pain, unspecified: Secondary | ICD-10-CM | POA: Diagnosis not present

## 2023-10-14 DIAGNOSIS — M47814 Spondylosis without myelopathy or radiculopathy, thoracic region: Secondary | ICD-10-CM | POA: Diagnosis not present

## 2023-10-14 DIAGNOSIS — S32009A Unspecified fracture of unspecified lumbar vertebra, initial encounter for closed fracture: Secondary | ICD-10-CM | POA: Diagnosis not present

## 2023-10-14 LAB — GLUCOSE, CAPILLARY
Glucose-Capillary: 210 mg/dL — ABNORMAL HIGH (ref 70–99)
Glucose-Capillary: 191 mg/dL — ABNORMAL HIGH (ref 70–99)
Glucose-Capillary: 273 mg/dL — ABNORMAL HIGH (ref 70–99)
Glucose-Capillary: 249 mg/dL — ABNORMAL HIGH (ref 70–99)
Glucose-Capillary: 296 mg/dL — ABNORMAL HIGH (ref 70–99)
Glucose-Capillary: 292 mg/dL — ABNORMAL HIGH (ref 70–99)
Glucose-Capillary: 348 mg/dL — ABNORMAL HIGH (ref 70–99)

## 2023-10-14 LAB — BASIC METABOLIC PANEL WITH GFR
Anion gap: 9 (ref 5–15)
BUN: 22 mg/dL (ref 8–23)
CO2: 23 mmol/L (ref 22–32)
Calcium: 9.3 mg/dL (ref 8.9–10.3)
Chloride: 100 mmol/L (ref 98–111)
Creatinine, Ser: 1.02 mg/dL — ABNORMAL HIGH (ref 0.44–1.00)
GFR, Estimated: >60 mL/min (ref >60)
Glucose, Bld: 264 mg/dL — ABNORMAL HIGH (ref 70–99)
Potassium: 5.2 mmol/L — ABNORMAL HIGH (ref 3.5–5.1)
Sodium: 132 mmol/L — ABNORMAL LOW (ref 135–145)

## 2023-10-14 MED ORDER — LORAZEPAM 2 MG/ML IJ SOLN
1.0000 mg | Freq: Once | INTRAMUSCULAR | Status: AC
  Administered 2023-10-14: 1 mg via INTRAVENOUS
  Filled 2023-10-14: qty 1

## 2023-10-14 MED ORDER — GADOBUTROL 1 MMOL/ML IV SOLN
10.0000 mL | Freq: Once | INTRAVENOUS | Status: AC | PRN
  Administered 2023-10-14: 10 mL via INTRAVENOUS

## 2023-10-14 MED ORDER — HYDROMORPHONE HCL 1 MG/ML IJ SOLN
1.0000 mg | Freq: Once | INTRAMUSCULAR | Status: AC
  Administered 2023-10-14: 1 mg via INTRAVENOUS
  Filled 2023-10-14: qty 1

## 2023-10-14 MED ORDER — ACETAMINOPHEN 325 MG PO TABS
650.0000 mg | ORAL_TABLET | Freq: Four times a day (QID) | ORAL | Status: DC
  Administered 2023-10-14 – 2023-10-17 (×12): 650 mg via ORAL
  Filled 2023-10-14 (×11): qty 2

## 2023-10-14 MED ORDER — KETOROLAC TROMETHAMINE 15 MG/ML IJ SOLN
15.0000 mg | Freq: Three times a day (TID) | INTRAMUSCULAR | Status: DC
  Administered 2023-10-14 – 2023-10-15 (×4): 15 mg via INTRAVENOUS
  Filled 2023-10-14 (×4): qty 1

## 2023-10-14 MED ORDER — METHOCARBAMOL 500 MG PO TABS
750.0000 mg | ORAL_TABLET | Freq: Three times a day (TID) | ORAL | Status: DC
  Administered 2023-10-14 – 2023-10-17 (×9): 750 mg via ORAL
  Filled 2023-10-14 (×9): qty 2

## 2023-10-14 NOTE — Plan of Care (Signed)
  Problem: Health Behavior/Discharge Planning: Goal: Ability to identify and utilize available resources and services will improve Outcome: Progressing Goal: Ability to manage health-related needs will improve Outcome: Progressing   Problem: Metabolic: Goal: Ability to maintain appropriate glucose levels will improve Outcome: Progressing   Problem: Nutritional: Goal: Maintenance of adequate nutrition will improve Outcome: Progressing   Problem: Education: Goal: Knowledge of General Education information will improve Description: Including pain rating scale, medication(s)/side effects and non-pharmacologic comfort measures Outcome: Progressing   Problem: Health Behavior/Discharge Planning: Goal: Ability to manage health-related needs will improve Outcome: Progressing   Problem: Activity: Goal: Risk for activity intolerance will decrease Outcome: Progressing

## 2023-10-14 NOTE — Progress Notes (Signed)
 PT Cancellation Note  Patient Details Name: Tracie [name] MRN: [id number] DOB: 1960/05/25   Cancelled Treatment:    Reason Eval/Treat Not Completed: Pain limiting ability to participate (pt premedicated and continues to state left sided pain too intense to attempt any movment, RN aware)   [name] [name] 10/14/2023, 10:52 AM [name], PT Acute Rehabilitation Services Office: [phone]

## 2023-10-14 NOTE — Progress Notes (Signed)
 PT Cancellation Note  Patient Details Name: Tracie [name] MRN: [id number] DOB: [birth year]   Cancelled Treatment:    Reason Eval/Treat Not Completed: Pain limiting ability to participate (pt reports pain too intense to participate and RN states can't give meds yet due to hypotenion, will plan to reattempt)   Lilymarie Scroggins B Caddie Randle 10/14/2023, 7:43 AM [name], PT Acute Rehabilitation Services Office: [phone]

## 2023-10-14 NOTE — Progress Notes (Signed)
 PROGRESS NOTE    Tracie [name]  FMW:[id number] DOB: 1960-05-16 DOA: 10/02/2023 PCP: Rolinda Millman, MD  63 y.o. female with medical history significant for hypertension, type 2 diabetes mellitus, history of DVT, PAF on Eliquis , chronic HFpEF, and chronic low back pain who presents with worsening bilateral lower extremity swelling, shortness of breath, and back pain.  Patient is admitted to the hospitalist service for further management evaluation of heart failure exacerbation, A-fib RVR, severe back pain. Patient found to have L1-L2 fracture on x-ray, neurosurgery consulted.  Cardiology consulted for CHF exacerbation, A-fib with RVR.  Patient is started on IV Lasix , beta-blocker therapy and Eliquis .  She seems to be noncompliant with her medications. - Improved with diuresis, supportive care and pain meds for her low back, followed by cardiology -Eventually discharged 7/5 AM -Subsequently 7/5 early afternoon prior to discharge started having severe left lateral wall pleuritic chest pain, urgent CTA chest negative for PE   Subjective: - Severe persistent left lateral chest wall pain, starting in lower thoracic spine  Assessment and Plan:  Persistent A-fib with RVR: Cardiology consult appreciated.,  Suspected to be permanent A-fib at this time, declined cardioversion Continue Cardizem , Lopressor  and Eliquis    Acute on chronic diastolic heart failure: Recent EF 60-65%.,  Normal RV -Improving with diuresis, 15 L negative, switched to oral torsemide  yesterday -Cards following, continue Lopressor , losartan , Aldactone   -poor candidate for SGLT2i - CTA with some residual pulmonary edema on Saturday, increased torsemide  dose   Hypokalemia Hypomagnesemia. Replaced.   L1 compression fracture- H/o kyphoplasty 02/2023 Chronic osteoporosis as well as arthritis. MRI lumbar spine reviewed shows interval development of worsening central spinal canal stenosis at L1 secondary to compression  fracture, interval development of superior endplate compression deformity of L2. Seen by neurosurgery-advised conservative management due to her morbid obesity, anatomy, cardiac risk factors, she is not a surgical candidate. -Robaxin , lidocaine  patch, Dilaudid  for severe pain - Was overall improving and then developed severe left lateral chest wall pain with some radiation to the spine, Limited benefit with Decadron , add Toradol , scheduled Tylenol  -Considering severity of symptoms now we will obtain CTA thoracic and lumbar spine  Severe left lateral pleuritic chest pain in mid axillary line -CTA chest negative for PE,  musculoskeletal, tenderness noted as well - Suspect it is radiation from L1-L2 fracture - See discussion above regarding pain control   Morbid obesity Body mass index is 46.06 kg/m.  Complicating further her current condition. Diet, exercise and weight reduction advised. Concern for obesity hypoventilation syndrome is also high.   Recommended patient to have outpatient sleep study for further evaluation.   Type 2 diabetes mellitus.  Well-controlled without long-term insulin  use without complication. Hemoglobin A1c 6.2.  In the past hemoglobin A1c has been at 7.6. On glipizide  2.5 mg daily.  - Continue Semglee    DVT prophylaxis: Eliquis  Code Status: Full code Family Communication: None present Disposition Plan: Home pending improvement in symptoms  Consultants: Cards   Procedures:   Antimicrobials:    Objective: Vitals:   10/14/23 0020 10/14/23 0422 10/14/23 0533 10/14/23 0730  BP: 130/75 128/84 (!) 99/57 (!) 125/91  Pulse:  91  (!) 109  Resp: 19 18  20   Temp: 98 F (36.7 C) 97.8 F (36.6 C)  97.7 F (36.5 C)  TempSrc: Oral Oral  Oral  SpO2: 95% 92% 93% 95%  Weight:      Height:        Intake/Output Summary (Last 24 hours) at 10/14/2023 1153 Last data  filed at 10/14/2023 0800 Gross per 24 hour  Intake --  Output 2100 ml  Net -2100 ml   Filed  Weights   10/10/23 0452 10/11/23 0503 10/12/23 0506  Weight: (!) 142.7 kg (!) 140.7 kg (!) 142.9 kg    Examination:  General exam: Morbidly obese chronically ill female sitting up in bed, AAO x 3 HEENT: Neck obese unable to assess JVD CVS: S1-S2, regular rhythm Lungs: Decreased breath sounds at the bases Abdomen: Soft, nontender, bowel sounds present Extremities: Trace edema Skin: No rashes Psychiatry:  Mood & affect appropriate.     Data Reviewed:   CBC: Recent Labs  Lab 10/10/23 0226 10/13/23 0735  WBC 8.9 8.4  HGB 15.2* 14.3  HCT 48.4* 45.4  MCV 87.8 89.0  PLT 294 268   Basic Metabolic Panel: Recent Labs  Lab 10/09/23 0235 10/10/23 0226 10/11/23 0241 10/12/23 0222 10/13/23 0314 10/14/23 0314  NA 135 135 134* 134* 135 132*  K 4.2 4.1 4.2 4.6 4.4 5.2*  CL 96* 95* 97* 99 100 100  CO2 30 29 29 28 27 23   GLUCOSE 173* 184* 174* 181* 194* 264*  BUN 15 17 16 21 18 22   CREATININE 0.75 0.79 0.90 0.92 1.03* 1.02*  CALCIUM 8.7* 9.0 8.8* 9.1 9.2 9.3  MG 1.8 1.4* 1.9 1.6* 1.8  --    GFR: Estimated Creatinine Clearance: 86.5 mL/min (A) (by C-G formula based on SCr of 1.02 mg/dL (H)). Liver Function Tests: No results for input(s): AST, ALT, ALKPHOS, BILITOT, PROT, ALBUMIN in the last 168 hours.  No results for input(s): LIPASE, AMYLASE in the last 168 hours. No results for input(s): AMMONIA in the last 168 hours. Coagulation Profile: No results for input(s): INR, PROTIME in the last 168 hours.  Cardiac Enzymes: No results for input(s): CKTOTAL, CKMB, CKMBINDEX, TROPONINI in the last 168 hours. BNP (last 3 results) No results for input(s): PROBNP in the last 8760 hours. HbA1C: No results for input(s): HGBA1C in the last 72 hours. CBG: Recent Labs  Lab 10/13/23 1111 10/13/23 1612 10/13/23 2058 10/14/23 0627 10/14/23 1131  GLUCAP 213* 198* 328* 273* 249*   Lipid Profile: No results for input(s): CHOL, HDL, LDLCALC,  TRIG, CHOLHDL, LDLDIRECT in the last 72 hours. Thyroid Function Tests: No results for input(s): TSH, T4TOTAL, FREET4, T3FREE, THYROIDAB in the last 72 hours. Anemia Panel: No results for input(s): VITAMINB12, FOLATE, FERRITIN, TIBC, IRON, RETICCTPCT in the last 72 hours. Urine analysis:    Component Value Date/Time   COLORURINE YELLOW 02/24/2023 0518   APPEARANCEUR CLEAR 02/24/2023 0518   APPEARANCEUR Clear 07/16/2018 1126   LABSPEC 1.030 02/24/2023 0518   PHURINE 5.0 02/24/2023 0518   GLUCOSEU >=500 (A) 02/24/2023 0518   HGBUR NEGATIVE 02/24/2023 0518   BILIRUBINUR NEGATIVE 02/24/2023 0518   BILIRUBINUR Negative 07/16/2018 1126   KETONESUR 5 (A) 02/24/2023 0518   PROTEINUR NEGATIVE 02/24/2023 0518   UROBILINOGEN 0.2 01/04/2009 1218   NITRITE NEGATIVE 02/24/2023 0518   LEUKOCYTESUR NEGATIVE 02/24/2023 0518   Sepsis Labs: @LABRCNTIP (procalcitonin:4,lacticidven:4)  )No results found for this or any previous visit (from the past 240 hours).   Radiology Studies: CT Angio Chest Pulmonary Embolism (PE) W or WO Contrast Result Date: 10/12/2023 CLINICAL DATA:  Pulmonary embolus suspected with low to intermediate probability. Negative D-dimer. Leg swelling. EXAM: CT ANGIOGRAPHY CHEST WITH CONTRAST TECHNIQUE: Multidetector CT imaging of the chest was performed using the standard protocol during bolus administration of intravenous contrast. Multiplanar CT image reconstructions and MIPs were obtained to evaluate  the vascular anatomy. RADIATION DOSE REDUCTION: This exam was performed according to the departmental dose-optimization program which includes automated exposure control, adjustment of the mA and/or kV according to patient size and/or use of iterative reconstruction technique. CONTRAST:  75mL OMNIPAQUE  IOHEXOL  350 MG/ML SOLN COMPARISON:  Chest radiograph 10/02/2023 and 02/24/2023 FINDINGS: Cardiovascular: Technically adequate study with good opacification of the  central and segmental pulmonary arteries. No focal filling defects. No evidence of significant pulmonary embolus. Cardiac enlargement. Small pericardial effusions. Ascending aortic aneurysm measuring 4.9 cm diameter. No aortic dissection. Scattered aortic calcification. Scattered coronary artery calcification. Mediastinum/Nodes: Esophagus is decompressed. No significant lymphadenopathy. Thyroid gland is unremarkable. Lungs/Pleura: Motion artifact limits examination. Patchy airspace infiltrates are suggested in the lung bases, possibly edema. Pneumonia less likely. No pleural effusion or pneumothorax. Upper Abdomen: No acute abnormalities. Musculoskeletal: Degenerative changes in the spine. Old compression deformity of T12 post kyphoplasty. No acute bony abnormalities. Review of the MIP images confirms the above findings. IMPRESSION: 1. No evidence of significant pulmonary embolus. 2. Cardiac enlargement with small pericardial effusion. 3. Patchy airspace changes in the lungs most likely edema. Pneumonia less likely. 4. 4.9 cm diameter ascending thoracic aortic aneurysm. Ascending thoracic aortic aneurysm. Recommend semi-annual imaging followup by CTA or MRA and referral to cardiothoracic surgery if not already obtained. This recommendation follows 2010 ACCF/AHA/AATS/ACR/ASA/SCA/SCAI/SIR/STS/SVM Guidelines for the Diagnosis and Management of Patients With Thoracic Aortic Disease. Circulation. 2010; 121: Z733-z630. Aortic aneurysm NOS (ICD10-I71.9) Electronically Signed   By: Elsie Gravely M.D.   On: 10/12/2023 20:24     Scheduled Meds:  acetaminophen   650 mg Oral Q6H   apixaban   5 mg Oral BID   diltiazem   240 mg Oral Daily   insulin  aspart  0-15 Units Subcutaneous TID WC   insulin  aspart  0-5 Units Subcutaneous QHS   insulin  glargine-yfgn  10 Units Subcutaneous Daily   ketorolac   15 mg Intravenous Q8H   lactulose   20 g Oral BID   lidocaine   1 patch Transdermal Q24H   methocarbamol   750 mg Oral TID    metoprolol  tartrate  100 mg Oral BID   pantoprazole   40 mg Oral Daily   polyethylene glycol  17 g Oral BID   sodium chloride  flush  3 mL Intravenous Q12H   spironolactone   12.5 mg Oral Daily   torsemide   40 mg Oral Daily   Continuous Infusions:     LOS: 11 days    Time spent:    Sigurd Pac, MD [facility]   10/14/2023, 11:53 AM

## 2023-10-15 ENCOUNTER — Other Ambulatory Visit (HOSPITAL_COMMUNITY): Payer: Self-pay

## 2023-10-15 DIAGNOSIS — I4821 Permanent atrial fibrillation: Secondary | ICD-10-CM | POA: Diagnosis not present

## 2023-10-15 LAB — BASIC METABOLIC PANEL WITH GFR
Anion gap: 9 (ref 5–15)
BUN: 32 mg/dL — ABNORMAL HIGH (ref 8–23)
CO2: 26 mmol/L (ref 22–32)
Calcium: 9 mg/dL (ref 8.9–10.3)
Chloride: 99 mmol/L (ref 98–111)
Creatinine, Ser: 1.1 mg/dL — ABNORMAL HIGH (ref 0.44–1.00)
GFR, Estimated: 56 mL/min — ABNORMAL LOW (ref >60)
Glucose, Bld: 246 mg/dL — ABNORMAL HIGH (ref 70–99)
Potassium: 4.3 mmol/L (ref 3.5–5.1)
Sodium: 134 mmol/L — ABNORMAL LOW (ref 135–145)

## 2023-10-15 LAB — GLUCOSE, CAPILLARY
Glucose-Capillary: 246 mg/dL — ABNORMAL HIGH (ref 70–99)
Glucose-Capillary: 268 mg/dL — ABNORMAL HIGH (ref 70–99)
Glucose-Capillary: 122 mg/dL — ABNORMAL HIGH (ref 70–99)
Glucose-Capillary: 200 mg/dL — ABNORMAL HIGH (ref 70–99)

## 2023-10-15 MED ORDER — GABAPENTIN 100 MG PO CAPS
200.0000 mg | ORAL_CAPSULE | Freq: Two times a day (BID) | ORAL | Status: DC
  Administered 2023-10-15 – 2023-10-17 (×5): 200 mg via ORAL
  Filled 2023-10-15 (×5): qty 2

## 2023-10-15 MED ORDER — INSULIN GLARGINE-YFGN 100 UNIT/ML ~~LOC~~ SOLN
15.0000 [IU] | Freq: Every day | SUBCUTANEOUS | Status: DC
  Administered 2023-10-15 – 2023-10-17 (×3): 15 [IU] via SUBCUTANEOUS
  Filled 2023-10-15 (×3): qty 0.15

## 2023-10-15 MED ORDER — KETOROLAC TROMETHAMINE 15 MG/ML IJ SOLN
15.0000 mg | Freq: Three times a day (TID) | INTRAMUSCULAR | Status: AC
  Administered 2023-10-15 – 2023-10-16 (×5): 15 mg via INTRAVENOUS
  Filled 2023-10-15 (×5): qty 1

## 2023-10-15 MED ORDER — INSULIN ASPART 100 UNIT/ML IJ SOLN
3.0000 [IU] | Freq: Three times a day (TID) | INTRAMUSCULAR | Status: DC
  Administered 2023-10-15 – 2023-10-17 (×7): 3 [IU] via SUBCUTANEOUS

## 2023-10-15 NOTE — Progress Notes (Signed)
 Physical Therapy Treatment Patient Details Name: Tracie [name] MRN: [id number] DOB: [birth year] Today's Date: 10/15/2023   History of Present Illness 63 y.o female adm 10/02/23 for LB edema and back pain with HF exacerbation, Afib with RVR. MRI showed L1-L2 fxs & mod spinal stenosis with conservative management. Hospital course complicated by persistent afib with RVR, pt declined cardioversion. PMHx: Kyphoplasty 2024, DVT, A-fib, T2DM, HFpEF.    PT Comments  Pt reports left chest pain a 7/10 after premedication, agreeable to mobility and walking with rise to 8/10 pain during session and RN provided medication. Pt with increased gait tolerance with max HR 130 and pt educated for sit to stand tranfers with education for sequence of transfers for strengthening rather than use of momentum. Pt limited by pain, will continue to follow. HHPT remains appropriate.   If plan is discharge home, recommend the following: A little help with walking and/or transfers;A little help with bathing/dressing/bathroom;Assistance with cooking/housework;Assist for transportation;Help with stairs or ramp for entrance;Direct supervision/assist for financial management   Can travel by private vehicle     Yes  Equipment Recommendations  BSC/3in1    Recommendations for Other Services       Precautions / Restrictions Precautions Precautions: Other (comment);Back;Fall Precaution Booklet Issued: No Recall of Precautions/Restrictions: Impaired Precaution/Restrictions Comments: low back pain with L1-L2 compression fractures; monitor HR with activity     Mobility  Bed Mobility Overal bed mobility: Modified Independent             General bed mobility comments: pt with use of rail and momentum to pivot to left side of bed with HOB 30 degrees despite cues for potential benefit of getting up to right with roll due to left sided pain    Transfers Overall transfer level: Needs assistance   Transfers: Sit  to/from Stand Sit to Stand: Supervision           General transfer comment: pt reliant on momentum to stand from bed and recliner. performed 5 repeated sit to stands from chair with pt reliant on UB and momentum despite education. after demonstration and step by step break down of task pt able to perform 2 additional stands with scooting to edge and standing without UB support    Ambulation/Gait Ambulation/Gait assistance: Contact guard assist Gait Distance (Feet): 90 Feet Assistive device: Rolling walker (2 wheels) Gait Pattern/deviations: Step-through pattern, Decreased stride length, Wide base of support, Trunk flexed   Gait velocity interpretation: 1.31 - 2.62 ft/sec, indicative of limited community ambulator   General Gait Details: pt with self too posterior to RW, cues for posture and proximity to RW with pt able to self regulate distance and needing cues to look up. max HR 130 with activity   Stairs             Wheelchair Mobility     Tilt Bed    Modified Rankin (Stroke Patients Only)       Balance Overall balance assessment: Needs assistance Sitting-balance support: Feet supported, No upper extremity supported Sitting balance-Leahy Scale: Fair     Standing balance support: Reliant on assistive device for balance, During functional activity, Bilateral upper extremity supported Standing balance-Leahy Scale: Poor Standing balance comment: reliant on RW                            Communication Communication Communication: No apparent difficulties  Cognition Arousal: Alert Behavior During Therapy: WFL for tasks assessed/performed   PT -  Cognitive impairments: Safety/Judgement                       PT - Cognition Comments: pt following commands, limited adherence to back precautions and requires repeated cues and demonstration to follow cues not to use momentum to stand Following commands: Impaired Following commands impaired: Follows  one step commands inconsistently    Cueing Cueing Techniques: Verbal cues  Exercises      General Comments        Pertinent Vitals/Pain Pain Assessment Pain Score: 7  Pain Location: left chest Pain Descriptors / Indicators: Aching, Constant Pain Intervention(s): Limited activity within patient's tolerance, Monitored during session, Premedicated before session, Repositioned, RN gave pain meds during session    Home Living                          Prior Function            PT Goals (current goals can now be found in the care plan section) Acute Rehab PT Goals Patient Stated Goal: return home PT Goal Formulation: With patient Time For Goal Achievement: 10/29/23 Progress towards PT goals: Progressing toward goals    Frequency    Min 2X/week      PT Plan      Co-evaluation              AM-PAC PT 6 Clicks Mobility   Outcome Measure  Help needed turning from your back to your side while in a flat bed without using bedrails?: None Help needed moving from lying on your back to sitting on the side of a flat bed without using bedrails?: A Little Help needed moving to and from a bed to a chair (including a wheelchair)?: A Little Help needed standing up from a chair using your arms (e.g., wheelchair or bedside chair)?: A Little Help needed to walk in hospital room?: A Little Help needed climbing 3-5 steps with a railing? : A Lot 6 Click Score: 18    End of Session   Activity Tolerance: Patient tolerated treatment well Patient left: with call bell/phone within reach;in chair Nurse Communication: Mobility status;Other (comment) PT Visit Diagnosis: Unsteadiness on feet (R26.81);Other abnormalities of gait and mobility (R26.89);Difficulty in walking, not elsewhere classified (R26.2)     Time: 8888-8867 PT Time Calculation (min) (ACUTE ONLY): 21 min  Charges:    $Gait Training: 8-22 mins PT General Charges $$ ACUTE PT VISIT: 1 Visit                      [name], PT Acute Rehabilitation Services Office: [phone]    Lenoard NOVAK Elohim Brune 10/15/2023, 1:07 PM

## 2023-10-15 NOTE — Progress Notes (Signed)
 PT Cancellation Note  Patient Details Name: Tracie [name] MRN: [id number] DOB: March 08, 1961   Cancelled Treatment:    Reason Eval/Treat Not Completed: Pain limiting ability to participate (pt reports left chest pain 8/10 after toradol /tylenol  and continues to refuse mobility at this time despite education for importance and roll in potentially helping pain)   Tracie [name] B Tracie [name] 10/15/2023, 8:00 AM Tracie [name], PT Acute Rehabilitation Services Office: [phone]

## 2023-10-15 NOTE — Progress Notes (Signed)
 PROGRESS NOTE    Tracie [name]  FMW:[id number] DOB: Jan 16, 1961 DOA: 10/02/2023 PCP: Rolinda Millman, MD  63 y.o. female with medical history significant for hypertension, type 2 diabetes mellitus, history of DVT, PAF on Eliquis , chronic HFpEF, and chronic low back pain who presents with worsening bilateral lower extremity swelling, shortness of breath, and back pain.  Patient is admitted to the hospitalist service for further management evaluation of heart failure exacerbation, A-fib RVR, severe back pain. Patient found to have L1-L2 fracture on x-ray, neurosurgery consulted.  Cardiology consulted for CHF exacerbation, A-fib with RVR.  Patient is started on IV Lasix , beta-blocker therapy and Eliquis .  She seems to be noncompliant with her medications. - Improved with diuresis, supportive care and pain meds for her low back, followed by cardiology -Eventually discharged 7/5 AM -Subsequently 7/5 early afternoon prior to discharge after turning started having severe left lateral wall pleuritic chest pain, urgent CTA chest negative for PE - Difficulty with pain control, added Decadron , lidocaine  patch -7/7 started Toradol , MRI LS spine no new findings apart from L1 and L2 fracture   Subjective: - Severe persistent left lateral chest wall pain that radiates from her lateral spine, feels like symptoms are starting to get a little better after starting Toradol  yesterday  Assessment and Plan:  Persistent A-fib with RVR: Cardiology consult appreciated.,  Suspected to be permanent A-fib at this time, declined cardioversion Continue Cardizem , Lopressor  and Eliquis    Acute on chronic diastolic heart failure: Recent EF 60-65%.,  Normal RV -Improving with diuresis, 15 L negative, switched to oral torsemide   -Cards following, continue Lopressor , losartan , Aldactone   -poor candidate for SGLT2i - CTA with some residual pulmonary edema on Saturday, increased torsemide  dose 7/6    Hypokalemia Hypomagnesemia. Replaced.   L1, L2 compression fracture- H/o kyphoplasty 02/2023 Chronic osteoporosis as well as arthritis. MRI lumbar spine reviewed shows interval development of worsening central spinal canal stenosis at L1 secondary to compression fracture, interval development of superior endplate compression deformity of L2. Seen by neurosurgery-advised conservative management due to her morbid obesity, anatomy, cardiac risk factors, she is not a surgical candidate. -Robaxin , lidocaine  patch, Dilaudid  for severe pain - Was overall improving and then developed severe left lateral chest wall pain with some radiation to the spine, Limited benefit with Decadron , started Toradol , scheduled Tylenol  -Considering severity of symptoms CTA chest negative for PE, MRI LS spine with known L1 and L2 fractures, will add gabapentin  today, increase activity  Severe left lateral pleuritic chest pain in mid axillary line -CTA chest negative for PE,  musculoskeletal, tenderness noted as well - Suspect it is radiation from L1-L2 fracture - See discussion above regarding pain control   Morbid obesity Body mass index is 46.06 kg/m.  Complicating further her current condition. Diet, exercise and weight reduction advised. Concern for obesity hypoventilation syndrome is also high.   Recommended patient to have outpatient sleep study for further evaluation.   Type 2 diabetes mellitus.  Well-controlled without long-term insulin  use without complication. Hemoglobin A1c 6.2.  In the past hemoglobin A1c has been at 7.6. On glipizide  2.5 mg daily.  - Continue Semglee    DVT prophylaxis: Eliquis  Code Status: Full code Family Communication: None present Disposition Plan: Home pending improvement in symptoms, likely 24 to 48 hours  Consultants: Cards   Procedures:   Antimicrobials:    Objective: Vitals:   10/14/23 1543 10/15/23 0046 10/15/23 0457 10/15/23 0723  BP: 111/89 120/82 135/80  (!) 144/80  Pulse: 96  97 99  Resp: 18 18 18 20   Temp: 98 F (36.7 C) (!) 97.5 F (36.4 C) (!) 97.5 F (36.4 C) 97.8 F (36.6 C)  TempSrc: Oral Oral Oral Oral  SpO2: 93% 94% 92% 92%  Weight:   (!) 141.1 kg   Height:        Intake/Output Summary (Last 24 hours) at 10/15/2023 1135 Last data filed at 10/15/2023 0900 Gross per 24 hour  Intake 596 ml  Output 1100 ml  Net -504 ml   Filed Weights   10/11/23 0503 10/12/23 0506 10/15/23 0457  Weight: (!) 140.7 kg (!) 142.9 kg (!) 141.1 kg    Examination:  General exam: Morbidly obese chronically ill female sitting up in bed, AAO x 3 HEENT: No JVD CVS: S1-S2, regular rhythm Lungs: Decreased breath sounds at the bases Abdomen: Soft, nontender, bowel sounds present Extremities: Trace edema  Skin: No rashes Psychiatry:  Mood & affect appropriate.     Data Reviewed:   CBC: Recent Labs  Lab 10/10/23 0226 10/13/23 0735  WBC 8.9 8.4  HGB 15.2* 14.3  HCT 48.4* 45.4  MCV 87.8 89.0  PLT 294 268   Basic Metabolic Panel: Recent Labs  Lab 10/09/23 0235 10/10/23 0226 10/11/23 0241 10/12/23 0222 10/13/23 0314 10/14/23 0314 10/15/23 0257  NA 135 135 134* 134* 135 132* 134*  K 4.2 4.1 4.2 4.6 4.4 5.2* 4.3  CL 96* 95* 97* 99 100 100 99  CO2 30 29 29 28 27 23 26   GLUCOSE 173* 184* 174* 181* 194* 264* 246*  BUN 15 17 16 21 18 22  32*  CREATININE 0.75 0.79 0.90 0.92 1.03* 1.02* 1.10*  CALCIUM 8.7* 9.0 8.8* 9.1 9.2 9.3 9.0  MG 1.8 1.4* 1.9 1.6* 1.8  --   --    GFR: Estimated Creatinine Clearance: 79.5 mL/min (A) (by C-G formula based on SCr of 1.1 mg/dL (H)). Liver Function Tests: No results for input(s): AST, ALT, ALKPHOS, BILITOT, PROT, ALBUMIN in the last 168 hours.  No results for input(s): LIPASE, AMYLASE in the last 168 hours. No results for input(s): AMMONIA in the last 168 hours. Coagulation Profile: No results for input(s): INR, PROTIME in the last 168 hours.  Cardiac Enzymes: No results  for input(s): CKTOTAL, CKMB, CKMBINDEX, TROPONINI in the last 168 hours. BNP (last 3 results) No results for input(s): PROBNP in the last 8760 hours. HbA1C: No results for input(s): HGBA1C in the last 72 hours. CBG: Recent Labs  Lab 10/14/23 1541 10/14/23 2107 10/14/23 2213 10/15/23 0553 10/15/23 1059  GLUCAP 296* 292* 348* 246* 268*   Lipid Profile: No results for input(s): CHOL, HDL, LDLCALC, TRIG, CHOLHDL, LDLDIRECT in the last 72 hours. Thyroid Function Tests: No results for input(s): TSH, T4TOTAL, FREET4, T3FREE, THYROIDAB in the last 72 hours. Anemia Panel: No results for input(s): VITAMINB12, FOLATE, FERRITIN, TIBC, IRON, RETICCTPCT in the last 72 hours. Urine analysis:    Component Value Date/Time   COLORURINE YELLOW 02/24/2023 0518   APPEARANCEUR CLEAR 02/24/2023 0518   APPEARANCEUR Clear 07/16/2018 1126   LABSPEC 1.030 02/24/2023 0518   PHURINE 5.0 02/24/2023 0518   GLUCOSEU >=500 (A) 02/24/2023 0518   HGBUR NEGATIVE 02/24/2023 0518   BILIRUBINUR NEGATIVE 02/24/2023 0518   BILIRUBINUR Negative 07/16/2018 1126   KETONESUR 5 (A) 02/24/2023 0518   PROTEINUR NEGATIVE 02/24/2023 0518   UROBILINOGEN 0.2 01/04/2009 1218   NITRITE NEGATIVE 02/24/2023 0518   LEUKOCYTESUR NEGATIVE 02/24/2023 0518   Sepsis Labs: @LABRCNTIP (procalcitonin:4,lacticidven:4)  )No results found for this or any  previous visit (from the past 240 hours).   Radiology Studies: MR THORACIC SPINE W WO CONTRAST Result Date: 10/15/2023 EXAM: MRI THORACIC SPINE WITH AND WITHOUT INTRAVENOUS CONTRAST 10/14/2023 08:42:18 PM TECHNIQUE: Multiplanar multisequence MRI of the thoracic spine was performed with and without the administration of intravenous contrast. COMPARISON: MRI of the lumbar spine without contrast 11/02/2023. CLINICAL HISTORY: Mid-back pain; Severe mid back pain radiating to the left now, left lateral chest wall, known L1-L2 fracture. FINDINGS:  BONES AND ALIGNMENT: Chronic L1 compression fracture and spinal augmentation are again noted. Remote superior endplate fracture of L2 is stable. Hemangioma is present at T11. SPINAL CORD: Normal spinal cord volume. Normal spinal cord signal. SOFT TISSUES: A 3.5 cm simple cyst is present at the upper pole of the left kidney. Recommend no imaging follow up. DEGENERATIVE CHANGES: Slight degenerative retrolisthesis is present at T7-8 and T8-9. Schmorl's nodes are present at T7-8, T8-9 and T9-10. Facet hypertrophy contributes to mild right foraminal narrowing at T4-5, T9-10 and T10-11. Facet degenerative changes contribute to mild left foraminal narrowing at T6-7, T9-10 and T10-11. IMPRESSION: 1. No acute findings in the thoracic spine related to the mid-back pain. 2. Chronic L1 compression fracture and remote superior endplate fracture of L2, stable compared to prior study. Electronically signed by: Lonni Necessary MD 10/15/2023 04:36 AM EDT RP Workstation: [id number]   MR Lumbar Spine W Tommye Contrast Result Date: 10/15/2023 EXAM: MR Lumbar Spine with and without intravenous contrast. 10/14/2023 08:35:08 PM TECHNIQUE: Multiplanar multisequence MRI of the lumbar spine was performed with and without the administration of intravenous contrast. COMPARISON: None available CLINICAL HISTORY: Low back pain, symptoms persist with > 6 wks treatment. FINDINGS: BONES AND ALIGNMENT: Stable L1 compression fracture. Stable remote superior endplate fracture at L2. Retropulsed bone at L1 is stable and partially effaces the ventral CSF. A simple cyst of the left kidney measures 3.5 cm. SPINAL CORD: The conus terminates normally. SOFT TISSUES: No acute abnormality. L1-L2: Mild disc bulging without significant stenosis or change. Mild left foraminal narrowing at L1-L2 is stable. L2-L3: Mild disc bulging is stable without significant change or focal stenosis. L3-L4: Mild disc bulging is stable without significant change or focal  stenosis. L4-L5: Mild disc bulging is stable without significant change or focal stenosis. L5-S1: No disc herniation. No spinal canal stenosis or neural foraminal narrowing. IMPRESSION: 1. Stable L1 compression fracture with retropulsed bone partially effacing the ventral CSF. 2. Stable remote superior endplate fracture at L2. 3. Mild disc bulges at T12-L1, L2-3, L3-4, and L4-5 without significant stenosis or change. Electronically signed by: Lonni Necessary MD 10/15/2023 04:16 AM EDT RP Workstation: [id number]     Scheduled Meds:  acetaminophen   650 mg Oral Q6H   apixaban   5 mg Oral BID   diltiazem   240 mg Oral Daily   gabapentin   200 mg Oral BID   insulin  aspart  0-15 Units Subcutaneous TID WC   insulin  aspart  0-5 Units Subcutaneous QHS   insulin  aspart  3 Units Subcutaneous TID WC   insulin  glargine-yfgn  15 Units Subcutaneous Daily   ketorolac   15 mg Intravenous Q8H   lactulose   20 g Oral BID   lidocaine   1 patch Transdermal Q24H   methocarbamol   750 mg Oral TID   metoprolol  tartrate  100 mg Oral BID   pantoprazole   40 mg Oral Daily   polyethylene glycol  17 g Oral BID   sodium chloride  flush  3 mL Intravenous Q12H   spironolactone   12.5 mg Oral  Daily   torsemide   40 mg Oral Daily   Continuous Infusions:     LOS: 12 days    Time spent:    Sigurd Pac, MD [facility]   10/15/2023, 11:35 AM

## 2023-10-16 ENCOUNTER — Other Ambulatory Visit (HOSPITAL_COMMUNITY): Payer: Self-pay

## 2023-10-16 DIAGNOSIS — E876 Hypokalemia: Secondary | ICD-10-CM | POA: Diagnosis not present

## 2023-10-16 DIAGNOSIS — I4891 Unspecified atrial fibrillation: Secondary | ICD-10-CM | POA: Diagnosis not present

## 2023-10-16 DIAGNOSIS — E119 Type 2 diabetes mellitus without complications: Secondary | ICD-10-CM | POA: Diagnosis not present

## 2023-10-16 DIAGNOSIS — I5033 Acute on chronic diastolic (congestive) heart failure: Secondary | ICD-10-CM | POA: Diagnosis not present

## 2023-10-16 DIAGNOSIS — E66813 Obesity, class 3: Secondary | ICD-10-CM

## 2023-10-16 LAB — BASIC METABOLIC PANEL WITH GFR
Anion gap: 7 (ref 5–15)
BUN: 32 mg/dL — ABNORMAL HIGH (ref 8–23)
CO2: 26 mmol/L (ref 22–32)
Calcium: 8.2 mg/dL — ABNORMAL LOW (ref 8.9–10.3)
Chloride: 98 mmol/L (ref 98–111)
Creatinine, Ser: 1.02 mg/dL — ABNORMAL HIGH (ref 0.44–1.00)
GFR, Estimated: >60 mL/min (ref >60)
Glucose, Bld: 220 mg/dL — ABNORMAL HIGH (ref 70–99)
Potassium: 3.7 mmol/L (ref 3.5–5.1)
Sodium: 131 mmol/L — ABNORMAL LOW (ref 135–145)

## 2023-10-16 LAB — GLUCOSE, CAPILLARY
Glucose-Capillary: 222 mg/dL — ABNORMAL HIGH (ref 70–99)
Glucose-Capillary: 188 mg/dL — ABNORMAL HIGH (ref 70–99)
Glucose-Capillary: 152 mg/dL — ABNORMAL HIGH (ref 70–99)
Glucose-Capillary: 197 mg/dL — ABNORMAL HIGH (ref 70–99)

## 2023-10-16 MED ORDER — POTASSIUM CHLORIDE CRYS ER 20 MEQ PO TBCR
40.0000 meq | EXTENDED_RELEASE_TABLET | Freq: Once | ORAL | Status: AC
  Administered 2023-10-16: 40 meq via ORAL
  Filled 2023-10-16: qty 2

## 2023-10-16 NOTE — Assessment & Plan Note (Addendum)
 Hypomagnesemia hyponatremia   At the time of her discharge her renal function is stable with serum cr at 1,0 with K at 3,9 and serum bicarbonate at 30  Na 133   Continue torsemide  and spironolactone  Follow up renal function and electrolytes as outpatient

## 2023-10-16 NOTE — Assessment & Plan Note (Signed)
 Patient declined direct current cardioversion.,   Plan to continue rate control with diltiazem  and metoprolol . Continue anticoagulation with apixaban .

## 2023-10-16 NOTE — Assessment & Plan Note (Addendum)
 Improved volume status Patient has diuresed 15 l during this hospitalization.  Continue with metoprolol , losartan , spironolactone .  Not good candidate for SGLT 2 inh.   Diuresis with torsemide .

## 2023-10-16 NOTE — TOC Progression Note (Signed)
 Transition of Care [facility]) - Progression Note    Patient Details  Name: Tracie [name] MRN: [id number] Date of Birth: 08-21-1960  Transition of Care [facility]) CM/SW Contact  Waddell Barnie Rama, RN Phone Number: 10/16/2023, 10:30 AM  Clinical Narrative:    Per MD note, Difficulty with pain control, added Decadron , lidocaine  patch .  TOC will continue to follow.        Expected Discharge Plan and Services         Expected Discharge Date: 10/12/23                           [facility] Agency: [facility] Date [facility] Agency Contacted: 10/12/23 Time HH Agency Contacted: 1036 Representative spoke with at [facility] Agency: Darleene   Social Determinants of Health (SDOH) Interventions SDOH Screenings   Food Insecurity: No Food Insecurity (10/03/2023)  Housing: Low Risk  (10/03/2023)  Transportation Needs: No Transportation Needs (10/03/2023)  Utilities: Not At Risk (10/03/2023)  Depression (PHQ2-9): Low Risk  (02/26/2020)  Tobacco Use: Medium Risk (10/03/2023)    Readmission Risk Interventions    10/08/2023    2:56 PM  Readmission Risk Prevention Plan  Post Dischage Appt Complete  Medication Screening Complete  Transportation Screening Complete

## 2023-10-16 NOTE — Progress Notes (Signed)
 Mobility Specialist Progress Note:   10/16/23 1100  Mobility  Activity Ambulated with assistance to bathroom  Level of Assistance Contact guard assist, steadying assist  Assistive Device Front wheel walker  Distance Ambulated (ft) 30 ft  Activity Response Tolerated well  Mobility Referral Yes  Mobility visit 1 Mobility  Mobility Specialist Start Time (ACUTE ONLY) 1100  Mobility Specialist Stop Time (ACUTE ONLY) 1115  Mobility Specialist Time Calculation (min) (ACUTE ONLY) 15 min   Pt agreeable to mobility session, however limited by 8/10 L chest/back pain. Pt able to ambulate with RW to BR, and back to bed. Further ambulation declined d/t uncontrolled pain. HR 110. Back in bed with all needs met.   Therisa Rana Mobility Specialist Please contact via SecureChat or  Rehab office at [phone]

## 2023-10-16 NOTE — Progress Notes (Signed)
 Occupational Therapy Treatment Patient Details Name: Tracie [name] MRN: [id number] DOB: 1960/05/26 Today's Date: 10/16/2023   History of present illness 63 y.o female adm 10/02/23 for LB edema and back pain with HF exacerbation, Afib with RVR. MRI showed L1-L2 fxs & mod spinal stenosis with conservative management. Hospital course complicated by persistent afib with RVR, pt declined cardioversion. PMHx: Kyphoplasty 2024, DVT, A-fib, T2DM, HFpEF.   OT comments  Pt progressing well towards goals. Progressed tolerance for OOB ADLs today. Improved ability for STS with less momentum and effort. Pt able to rise from regular toilet with use of GB. Improved tolerance to include standing ADLs and mobility in hallway. HR elevated up to 125 bpm. Pt continues to be limited by decreased activity tolerance and balance. Continue to recommend HHOT to optimize independence levels. Will continue to follow acutely.       If plan is discharge home, recommend the following:  A little help with walking and/or transfers;A little help with bathing/dressing/bathroom;Assistance with cooking/housework;Assist for transportation   Equipment Recommendations  Tub/shower bench;Other (comment) (Bariatric)    Recommendations for Other Services      Precautions / Restrictions Precautions Precautions: Other (comment);Back;Fall Precaution Booklet Issued: No Recall of Precautions/Restrictions: Impaired Precaution/Restrictions Comments: low back pain with L1-L2 compression fractures; monitor HR with activity Restrictions Weight Bearing Restrictions Per Provider Order: No       Mobility Bed Mobility Overal bed mobility: Modified Independent             General bed mobility comments: With use of rails and HOB elevated no assist, increased effort    Transfers Overall transfer level: Needs assistance Equipment used: Rolling walker (2 wheels) Transfers: Sit to/from Stand, Bed to chair/wheelchair/BSC Sit to  Stand: Supervision     Step pivot transfers: Supervision     General transfer comment: improved STS supervision, requiring less rocking motion for momentum     Balance Overall balance assessment: Needs assistance Sitting-balance support: Feet supported, No upper extremity supported Sitting balance-Leahy Scale: Fair     Standing balance support: Reliant on assistive device for balance, During functional activity, Bilateral upper extremity supported Standing balance-Leahy Scale: Poor Standing balance comment: reliant on RW       ADL either performed or assessed with clinical judgement   ADL Overall ADL's : Needs assistance/impaired     Grooming: Supervision/safety;Standing     Toilet Transfer: Supervision/safety;Ambulation;Regular Toilet;Rolling walker (2 wheels);Grab bars Toilet Transfer Details (indicate cue type and reason): Use of GB to rise from regular toilet, s for safety Toileting- Clothing Manipulation and Hygiene: Supervision/safety;Sitting/lateral lean       Functional mobility during ADLs: Supervision/safety;Rolling walker (2 wheels) General ADL Comments: Progressed standing ADL tolerance    Extremity/Trunk Assessment Upper Extremity Assessment Upper Extremity Assessment: Overall WFL for tasks assessed   Lower Extremity Assessment Lower Extremity Assessment: Defer to PT evaluation                 Communication Communication Communication: No apparent difficulties   Cognition Arousal: Alert Behavior During Therapy: WFL for tasks assessed/performed Cognition: History of cognitive impairments     OT - Cognition Comments: Pt with poor safety awareness and judgement, decreased attention     Following commands: Intact        Cueing   Cueing Techniques: Verbal cues        General Comments Max HR during mobility 125    Pertinent Vitals/ Pain       Pain Assessment Pain Assessment: Faces Faces Pain  Scale: Hurts little more Pain Location: left  chest Pain Descriptors / Indicators: Aching, Constant Pain Intervention(s): Monitored during session   Frequency  Min 2X/week        Progress Toward Goals  OT Goals(current goals can now be found in the care plan section)  Progress towards OT goals: Progressing toward goals  Acute Rehab OT Goals Patient Stated Goal: To get better OT Goal Formulation: With patient Time For Goal Achievement: 10/18/23 Potential to Achieve Goals: Good ADL Goals Pt Will Perform Grooming: with supervision;standing Pt Will Perform Lower Body Dressing: with supervision;sit to/from stand Pt Will Transfer to Toilet: with supervision;ambulating;bedside commode Pt Will Perform Toileting - Clothing Manipulation and hygiene: with supervision;sit to/from stand  Plan         AM-PAC OT 6 Clicks Daily Activity     Outcome Measure   Help from another person eating meals?: None Help from another person taking care of personal grooming?: A Little Help from another person toileting, which includes using toliet, bedpan, or urinal?: A Little Help from another person bathing (including washing, rinsing, drying)?: A Little Help from another person to put on and taking off regular upper body clothing?: A Little Help from another person to put on and taking off regular lower body clothing?: A Little 6 Click Score: 19    End of Session Equipment Utilized During Treatment: Rolling walker (2 wheels)  OT Visit Diagnosis: Unsteadiness on feet (R26.81);Other abnormalities of gait and mobility (R26.89);Muscle weakness (generalized) (M62.81)   Activity Tolerance Patient tolerated treatment well   Patient Left in bed;with call bell/phone within reach;with family/visitor present   Nurse Communication Mobility status        Time: 8592-8577 OT Time Calculation (min): 15 min  Charges: OT General Charges $OT Visit: 1 Visit OT Treatments $Self Care/Home Management : 8-22 mins  Adrianne BROCKS, OT  Acute  Rehabilitation Services Office [phone] Secure chat preferred   Adrianne GORMAN Savers 10/16/2023, 2:29 PM

## 2023-10-16 NOTE — Assessment & Plan Note (Signed)
 Calculated BMI 43.5

## 2023-10-16 NOTE — Assessment & Plan Note (Signed)
 Patient was placed on glipizide  and insulin  therapy  Her glucose remained elevated, uncontrolled hyperglycemia.  Her fasting glucose at the time of her discharge was 227 mg/dl

## 2023-10-16 NOTE — Progress Notes (Addendum)
 Progress Note   Patient: Tracie [name] FMW:[id number] DOB: 05/09/1960 DOA: 10/02/2023     13 DOS: the patient was seen and examined on 10/16/2023   Brief hospital course: 63 y.o. female with medical history significant for hypertension, type 2 diabetes mellitus, history of DVT, PAF on Eliquis , chronic HFpEF, and chronic low back pain who presents with worsening bilateral lower extremity swelling, shortness of breath, and back pain.  Patient is admitted to the hospitalist service for further management evaluation of heart failure exacerbation, A-fib RVR, severe back pain. Patient found to have L1-L2 fracture on x-ray, neurosurgery consulted.  Cardiology consulted for CHF exacerbation, A-fib with RVR.  Patient is started on IV Lasix , beta-blocker therapy and Eliquis .  She seems to be noncompliant with her medications. - Improved with diuresis, supportive care and pain meds for her low back, followed by cardiology -Eventually discharged 7/5 AM -Subsequently 7/5 early afternoon prior to discharge after turning started having severe left lateral wall pleuritic chest pain, urgent CTA chest negative for PE - Difficulty with pain control, added Decadron , lidocaine  patch -7/7 started Toradol , MRI LS spine no new findings apart from L1 and L2 fracture  Assessment and Plan: * Acute on chronic diastolic CHF (congestive heart failure) (HCC) Improved volume status Patient has diuresed 15 l during this hospitalization.  Continue with metoprolol , losartan , spironolactone .  Not good candidate for SGLT 2 inh.   Diuresis with torsemide .   Atrial fibrillation with rapid ventricular response [facility]) Patient declined direct current cardioversion.,   Plan to continue rate control with diltiazem  and metoprolol . Continue anticoagulation with apixaban .   Hypokalemia Hypomagnesemia   Electrolytes have been corrected, continue close follow up renal function and electrolytes  Add 40 kcl today to avoid  hypokalemia.   Controlled type 2 diabetes mellitus without complication, without long-term current use of insulin  (HCC) Continue glipizide  and insulin  therapy  Will need close follow up as outpatient.   Lumbar pain L1, L2 compression fracture.  MRI lumbar spine reviewed shows interval development of worsening central spinal canal stenosis at L1 secondary to compression fracture, interval development of superior endplate compression deformity of L2.  Seen by neurosurgery-advised conservative management due to her morbid obesity, anatomy, cardiac risk factors, she is not a surgical candidate. -Robaxin , lidocaine  patch, Dilaudid  for severe pain  Limited benefit with Decadron , started Toradol , scheduled Tylenol  -Considering severity of symptoms CTA chest negative for PE, MRI LS spine with known L1 and L2 fractures, Improved pain with gabapentin .   Obesity, class 3 Calculated BMI 43.5      Subjective: Patient with improvement in back pain, able to move more, no chest pain, no PND or orthopnea   Physical Exam: Vitals:   10/16/23 0024 10/16/23 0433 10/16/23 0738 10/16/23 1119  BP: 113/65 (!) 131/97 127/80 105/77  Pulse:   80 (!) 103  Resp: 18 18 20 18   Temp: 98.3 F (36.8 C) 98.4 F (36.9 C) 97.6 F (36.4 C) 97.8 F (36.6 C)  TempSrc: Oral Oral Oral Oral  SpO2:   93% 94%  Weight:  (!) 137.6 kg    Height:       Neurology awake and alert ENT with mild pallor  Cardiovascular with S1 and S2 present and regular with no gallops, rubs or murmurs Respiratory with no rales or wheezing, no rhonchi  Abdomen with no distention, protuberant Lower extremity edema with non pitting  Data Reviewed:    Family Communication: I spoke with patient's husband at the bedside, we talked in detail  about patient's condition, plan of care and prognosis and all questions were addressed.   Disposition: Status is: Inpatient Remains inpatient appropriate because: recovering from acute pain    Planned Discharge Destination: Home    Author: Elidia Toribio Furnace, MD 10/16/2023 3:51 PM  For on call review [email or web address].

## 2023-10-16 NOTE — Assessment & Plan Note (Signed)
 L1, L2 compression fracture.  MRI lumbar spine reviewed shows interval development of worsening central spinal canal stenosis at L1 secondary to compression fracture, interval development of superior endplate compression deformity of L2.  Seen by neurosurgery-advised conservative management due to her morbid obesity, anatomy, cardiac risk factors, she is not a surgical candidate. -Robaxin , lidocaine  patch, Dilaudid  for severe pain  Limited benefit with Decadron , started Toradol , scheduled Tylenol  -Considering severity of symptoms CTA chest negative for PE, MRI LS spine with known L1 and L2 fractures, Improved pain with gabapentin .

## 2023-10-17 ENCOUNTER — Other Ambulatory Visit (HOSPITAL_COMMUNITY): Payer: Self-pay

## 2023-10-17 DIAGNOSIS — E876 Hypokalemia: Secondary | ICD-10-CM | POA: Diagnosis not present

## 2023-10-17 DIAGNOSIS — E1169 Type 2 diabetes mellitus with other specified complication: Secondary | ICD-10-CM | POA: Diagnosis not present

## 2023-10-17 DIAGNOSIS — I4891 Unspecified atrial fibrillation: Secondary | ICD-10-CM | POA: Diagnosis not present

## 2023-10-17 DIAGNOSIS — I5033 Acute on chronic diastolic (congestive) heart failure: Secondary | ICD-10-CM | POA: Diagnosis not present

## 2023-10-17 LAB — BASIC METABOLIC PANEL WITH GFR
Anion gap: 6 (ref 5–15)
BUN: 30 mg/dL — ABNORMAL HIGH (ref 8–23)
CO2: 30 mmol/L (ref 22–32)
Calcium: 8.4 mg/dL — ABNORMAL LOW (ref 8.9–10.3)
Chloride: 97 mmol/L — ABNORMAL LOW (ref 98–111)
Creatinine, Ser: 1.02 mg/dL — ABNORMAL HIGH (ref 0.44–1.00)
GFR, Estimated: >60 mL/min (ref >60)
Glucose, Bld: 227 mg/dL — ABNORMAL HIGH (ref 70–99)
Potassium: 3.9 mmol/L (ref 3.5–5.1)
Sodium: 133 mmol/L — ABNORMAL LOW (ref 135–145)

## 2023-10-17 LAB — GLUCOSE, CAPILLARY
Glucose-Capillary: 213 mg/dL — ABNORMAL HIGH (ref 70–99)
Glucose-Capillary: 163 mg/dL — ABNORMAL HIGH (ref 70–99)

## 2023-10-17 MED ORDER — TORSEMIDE 20 MG PO TABS
40.0000 mg | ORAL_TABLET | Freq: Every day | ORAL | 0 refills | Status: DC
  Filled 2023-10-17: qty 60, 30d supply, fill #0

## 2023-10-17 MED ORDER — METHOCARBAMOL 750 MG PO TABS
750.0000 mg | ORAL_TABLET | Freq: Three times a day (TID) | ORAL | 0 refills | Status: AC | PRN
  Filled 2023-10-17: qty 90, 30d supply, fill #0

## 2023-10-17 MED ORDER — GABAPENTIN 100 MG PO CAPS
200.0000 mg | ORAL_CAPSULE | Freq: Two times a day (BID) | ORAL | 0 refills | Status: AC
  Filled 2023-10-17: qty 120, 30d supply, fill #0

## 2023-10-17 MED ORDER — GLIPIZIDE 2.5 MG PO TABS
2.5000 mg | ORAL_TABLET | Freq: Every day | ORAL | 0 refills | Status: AC
  Filled 2023-10-17: qty 30, 30d supply, fill #0

## 2023-10-17 MED ORDER — DILTIAZEM HCL ER COATED BEADS 240 MG PO CP24
240.0000 mg | ORAL_CAPSULE | Freq: Every day | ORAL | 0 refills | Status: DC
  Filled 2023-10-17: qty 30, 30d supply, fill #0

## 2023-10-17 MED ORDER — ACETAMINOPHEN 325 MG PO TABS: 650.0000 mg | ORAL_TABLET | Freq: Four times a day (QID) | ORAL | Status: AC | PRN

## 2023-10-17 MED ORDER — LOSARTAN POTASSIUM 50 MG PO TABS
50.0000 mg | ORAL_TABLET | Freq: Every day | ORAL | 0 refills | Status: AC
  Filled 2023-10-17: qty 30, 30d supply, fill #0

## 2023-10-17 MED ORDER — APIXABAN 5 MG PO TABS
5.0000 mg | ORAL_TABLET | Freq: Two times a day (BID) | ORAL | 0 refills | Status: AC
Start: 2023-10-17 — End: ?
  Filled 2023-10-17: qty 60, 30d supply, fill #0

## 2023-10-17 NOTE — Progress Notes (Signed)
 Patient using rest room; waiting 30 mins. Unable to complete discharge at this time patient still in rest room. Informed patients nurse and charge nurse.

## 2023-10-17 NOTE — Progress Notes (Signed)
 Delay in discharging patient waiting on walker and patient not able to stay out of the bathroom. Laxative given this am.

## 2023-10-17 NOTE — Progress Notes (Signed)
 Discharge teach ing completed with spouse and patient. Patient ot pick up new meds from in house pharmacy on her way out. Patient voiced an understanding of new meds and what meds to stop.

## 2023-10-17 NOTE — Progress Notes (Signed)
 Patient to get meds and to discharge front area of hospital with Discharge nurse Tonia. Patient kept delaying leaving her room.

## 2023-10-17 NOTE — Progress Notes (Signed)
 Physical Therapy Treatment Patient Details Name: Tracie [name] MRN: [id number] DOB: 03-Aug-1960 Today's Date: 10/17/2023   History of Present Illness 63 y.o female adm 10/02/23 for LB edema and back pain with HF exacerbation, Afib with RVR. MRI showed L1-L2 fxs & mod spinal stenosis with conservative management. Hospital course complicated by persistent afib with RVR, pt declined cardioversion. PMHx: Kyphoplasty 2024, DVT, A-fib, T2DM, HFpEF.    PT Comments  Tolerated treatment very well today, states pain better controlled. Goals updated based on functional progress. Able to ambulate in hallway >100 feet with RW for support at  Supervision level. No physical assist required throughout session. Gait abnormal with mild sway but adequately supported by RW. Reviewed LE exercises and precautions. Needs cues to maintain back precautions. Patient will continue to benefit from skilled physical therapy services to further improve independence with functional mobility.    If plan is discharge home, recommend the following: A little help with walking and/or transfers;A little help with bathing/dressing/bathroom;Assistance with cooking/housework;Assist for transportation;Help with stairs or ramp for entrance;Direct supervision/assist for financial management   Can travel by private vehicle     Yes  Equipment Recommendations  BSC/3in1    Recommendations for Other Services       Precautions / Restrictions Precautions Precautions: Other (comment);Back;Fall Precaution Booklet Issued: No Recall of Precautions/Restrictions: Impaired Precaution/Restrictions Comments: L1-L2 compression fractures; monitor HR with activity Restrictions Weight Bearing Restrictions Per Provider Order: No     Mobility  Bed Mobility Overal bed mobility: Needs Assistance Bed Mobility: Supine to Sit     Supine to sit: Supervision, HOB elevated     General bed mobility comments: Quickly rose to EOB without following  precautions. Reviewed with pt. demonstrated and advised to perform each time.    Transfers Overall transfer level: Needs assistance Equipment used: Rolling walker (2 wheels) Transfers: Sit to/from Stand Sit to Stand: Supervision           General transfer comment: Supervision for safety, stable rising and supporting self on RW    Ambulation/Gait Ambulation/Gait assistance: Supervision Gait Distance (Feet): 120 Feet Assistive device: Rolling walker (2 wheels) Gait Pattern/deviations: Step-through pattern, Decreased stride length, Wide base of support, Trunk flexed, Drifts right/left Gait velocity: decreased Gait velocity interpretation: <1.8 ft/sec, indicate of risk for recurrent falls   General Gait Details: Increased sway but without overt LOB. Cues for upright posture and closer proximity to device. No dyspnea noted. HR increased to 152 while ambulating, quickly returns to 90s with short seated rest break. Asymptomatic. No buckling noted.   Stairs             Wheelchair Mobility     Tilt Bed    Modified Rankin (Stroke Patients Only)       Balance Overall balance assessment: Needs assistance Sitting-balance support: Feet supported, No upper extremity supported Sitting balance-Leahy Scale: Good     Standing balance support: Reliant on assistive device for balance, During functional activity, Bilateral upper extremity supported Standing balance-Leahy Scale: Poor Standing balance comment: reliant on RW                            Communication Communication Communication: No apparent difficulties  Cognition Arousal: Alert Behavior During Therapy: WFL for tasks assessed/performed   PT - Cognitive impairments: Memory, Safety/Judgement                       PT - Cognition Comments: Did not  recall precautions. Following commands: Intact Following commands impaired: Only follows one step commands consistently    Cueing Cueing  Techniques: Verbal cues  Exercises General Exercises - Lower Extremity Ankle Circles/Pumps: AROM, Both, 15 reps, Seated Quad Sets: Strengthening, Both, 10 reps, Seated Gluteal Sets: Strengthening, Both, 15 reps, Seated    General Comments General comments (skin integrity, edema, etc.): HR to 152 max while ambulating. educated on symptom awareness, pacing, energy conservation techniques. Reviewed precautions.      Pertinent Vitals/Pain Pain Assessment Pain Assessment: Faces Faces Pain Scale: Hurts a little bit Pain Location: Lt flank Pain Descriptors / Indicators: Aching, Constant Pain Intervention(s): Monitored during session, Repositioned, Utilized relaxation techniques    Home Living Family/patient expects to be discharged to:: Private residence Living Arrangements: Spouse/significant other Available Help at Discharge: Family;Available 24 hours/day Type of Home: Apartment Home Access: Level entry       Home Layout: One level Home Equipment: Agricultural consultant (2 wheels);Rollator (4 wheels);Cane - quad;Toilet riser;Shower seat;Adaptive equipment      Prior Function            PT Goals (current goals can now be found in the care plan section) Acute Rehab PT Goals Patient Stated Goal: return home PT Goal Formulation: With patient Time For Goal Achievement: 10/29/23 Potential to Achieve Goals: Fair Progress towards PT goals: Progressing toward goals    Frequency    Min 2X/week      PT Plan      Co-evaluation              AM-PAC PT 6 Clicks Mobility   Outcome Measure  Help needed turning from your back to your side while in a flat bed without using bedrails?: None Help needed moving from lying on your back to sitting on the side of a flat bed without using bedrails?: A Little Help needed moving to and from a bed to a chair (including a wheelchair)?: A Little Help needed standing up from a chair using your arms (e.g., wheelchair or bedside chair)?: A  Little Help needed to walk in hospital room?: A Little Help needed climbing 3-5 steps with a railing? : A Little 6 Click Score: 19    End of Session Equipment Utilized During Treatment: Gait belt Activity Tolerance: Patient tolerated treatment well Patient left: with call bell/phone within reach;in chair;with chair alarm set Nurse Communication: Mobility status PT Visit Diagnosis: Unsteadiness on feet (R26.81);Other abnormalities of gait and mobility (R26.89);Difficulty in walking, not elsewhere classified (R26.2) Pain - part of body:  (back, lt flank)     Time: 0950-1004 PT Time Calculation (min) (ACUTE ONLY): 14 min  Charges:    $Gait Training: 8-22 mins PT General Charges $$ ACUTE PT VISIT: 1 Visit                     Leontine Roads, PT, DPT [facility]  Rehabilitation Services Physical Therapist Office: [phone] Website: [email or web address]    Leontine GORMAN Roads 10/17/2023, 11:40 AM

## 2023-10-17 NOTE — TOC Transition Note (Addendum)
 Transition of Care [facility]) - Discharge Note   Patient Details  Name: Tracie [name] MRN: [id number] Date of Birth: 04/12/1960  [facility] of [facility]) CM/SW Contact:  Waddell Barnie Rama, RN Phone Number: 10/17/2023, 12:54 PM   Clinical Narrative:    For dc today, NCM notified [place] with [employer]. Patient would like a bariatric rolling walker.  She is ok with [employer], they will bring one up to room.   Final next level of care: Home w Home Health Services     Patient Goals and CMS Choice            Discharge Placement                       Discharge Plan and Services Additional resources added to the After Visit Summary for                              [facility] Agency: [facility] Date [facility] Agency Contacted: 10/12/23 Time HH Agency Contacted: 1036 Representative spoke with at [facility] Agency: Darleene  Social Drivers of Health (SDOH) Interventions SDOH Screenings   Food Insecurity: No Food Insecurity (10/03/2023)  Housing: Low Risk  (10/03/2023)  Transportation Needs: No Transportation Needs (10/03/2023)  Utilities: Not At Risk (10/03/2023)  Depression (PHQ2-9): Low Risk  (02/26/2020)  Tobacco Use: Medium Risk (10/03/2023)     Readmission Risk Interventions    10/08/2023    2:56 PM  Readmission Risk Prevention Plan  Post Dischage Appt Complete  Medication Screening Complete  Transportation Screening Complete

## 2023-10-17 NOTE — Discharge Summary (Signed)
 Physician Discharge Summary   Patient: Tracie [name] MRN: [id number] DOB: 02/23/1961  Admit date:     10/02/2023  Discharge date: 10/17/23  Discharge Physician: Elidia Toribio Furnace   PCP: Rolinda Millman, MD   Recommendations at discharge:    Patient was placed on guideline directed medical therapy for heart failure with losartan , metoprolol  succinate and spironolactone . Not candidate for SGLT 2 inh due to risk of urine infections.  Continue diuresis with torsemide  40 mg po daily Placed on gabapentin , methocarbamol  and oxycodone  for pain control.  Follow up renal functio and electrolytes in 7 days as outpatient.  Follow semi annual imaging for ascending aortic aneurysm.  Follow up with Dr Rolinda in 7 to 10 days Follow up with Cardiology as scheduled.   Discharge Diagnoses: Principal Problem:   Acute on chronic diastolic CHF (congestive heart failure) (HCC) Active Problems:   Atrial fibrillation with rapid ventricular response (HCC)   Hypokalemia   Controlled type 2 diabetes mellitus without complication, without long-term current use of insulin  (HCC)   Lumbar pain   Obesity, class 3  Resolved Problems:   * No resolved hospital problems. [facility] Course: Mrs. Tracie [name] was admitted to the hospital with the working diagnosis of heart failure exacerbation.   63 y.o. female with medical history significant for hypertension, type 2 diabetes mellitus, history of DVT, paroxysmal atrial fibrillation, heart failure, and chronic low back pain who presents with worsening bilateral lower extremity edema, dyspnea, and back pain.  She has been not adherent to her medical therapy as outpatient.  On her initial physical examination her blood pressure was 157/93, HR 100, RR 16 and 02 saturation 92%. Lungs with no wheezing or rhonchi, positive dyspnea with mininal efforts, heart with S1 and S2 present and regular with no gallops, or rubs, abdomen with no distention and positive lower  extremity edema.   Na 140, K 3,4 Cl 107 bicarbonate 25 glucose 143, bun 11 cr 0,70  AST 19 ALT 14  BNP 189.8 Wbc 10,2 hgb 14.9 plt 289   Chest radiograph with cardiomegaly with bilateral hilar vascular congestion.  L spine radiograph with progressive loss of height of the L1 fracture with methyl methacrylate present outside of the vertebral body.  New superior endplate fracture at L2 Exaggerated kyphosis.   CT chest with contrast with no evidence of pulmonary embolism.  Cardiac enlargement, with small pericardial effusion.  Patchy airspace changes in the lungs most likely edema.  4.9 cm diameter ascending thoracic aortic aneurysm, recommended semi annual follow up imaging.   Neurosurgery consulted, with recommendations to continue pain control, weight loss and physical therapy.   Patient is started on IV Lasix , beta-blocker therapy and Eliquis .  She seems to be noncompliant with her medications. - Improved with diuresis, supportive care and pain meds for her low back, -Eventually discharged 7/5 AM -Subsequently 7/5 early afternoon prior to discharge after turning started having severe left lateral wall pleuritic chest pain, urgent CTA chest negative for PE - Difficulty with pain control, added Decadron , lidocaine  patch -7/7 started Toradol , MRI LS spine no new findings apart from L1 and L2 fracture  07/10 improved pain control and mobility, patient will be discharged home and instructions for close follow up as outpatient.   Assessment and Plan: * Acute on chronic diastolic CHF (congestive heart failure) (HCC) Echocardiogram with preserved LV systolic function EF 60 to 65%, mild LVH, RV systolic function preserved, LA and RA with mild to moderate dilatation, no significant valvular disease. No  pericardial effusion. (Very poor image quality)   Patient was placed on IV furosemide  and then transitioned to po torsemide , negative fluid balance was achieved, - 19,389 ml, with significant  improvement in her symptoms.   Continue with metoprolol , losartan , spironolactone .  Not good candidate for SGLT 2 inh.  Diuresis with torsemide .   Will need close follow up as outpatient.  Refilled all her medications   Atrial fibrillation with rapid ventricular response [facility]) Patient declined direct current cardioversion.,   Plan to continue rate control with diltiazem  and metoprolol . Continue anticoagulation with apixaban .   Hypokalemia Hypomagnesemia hyponatremia   At the time of her discharge her renal function is stable with serum cr at 1,0 with K at 3,9 and serum bicarbonate at 30  Na 133   Continue torsemide  and spironolactone  Follow up renal function and electrolytes as outpatient   Type 2 diabetes mellitus (HCC) Patient was placed on glipizide  and insulin  therapy  Her glucose remained elevated, uncontrolled hyperglycemia.  Her fasting glucose at the time of her discharge was 227 mg/dl   Lumbar pain L1, L2 compression fracture.   MRI spine with stable L1 compression fracture with retropulsed bone partially effacing the ventral CSF. Stable remote superior endplate fracture at L2 Mild disc bulges at T12- L1, L2-3, L3-4 and L4-5 without significant stenosis or change.  No acute findings in the thoracic spine related to the mid back pain.  Seen by neurosurgery-advised conservative management due to her morbid obesity, anatomy, cardiac risk factors, she is not a surgical candidate.  Patient had a short course of Decadron ,  A the time of her discharge her pain has improved.   Obesity, class 3 Calculated BMI 43.5        Consultants: neurosurgery cardiology  Procedures performed: none   Disposition: Home Diet recommendation:  Discharge Diet Orders (From admission, onward)     Start     Ordered   10/12/23 0000  Diet - low sodium heart healthy        10/12/23 1009   10/12/23 0000  Diet Carb Modified        10/12/23 1009           Cardiac and Carb  modified diet DISCHARGE MEDICATION: Allergies as of 10/17/2023       Reactions   Codeine Other (See Comments)   Unknown reaction   Crestor [rosuvastatin] Other (See Comments)   Myalgias  Weakness        Medication List     STOP taking these medications    amLODipine  10 MG tablet Commonly known as: NORVASC    aspirin  EC 81 MG tablet   celecoxib  100 MG capsule Commonly known as: CELEBREX    empagliflozin  25 MG Tabs tablet Commonly known as: JARDIANCE    metoprolol  succinate 50 MG 24 hr tablet Commonly known as: TOPROL -XL       TAKE these medications    acetaminophen  325 MG tablet Commonly known as: TYLENOL  Take 2 tablets (650 mg total) by mouth every 6 (six) hours as needed for mild pain (pain score 1-3) or moderate pain (pain score 4-6).   apixaban  5 MG Tabs tablet Commonly known as: ELIQUIS  Take 1 tablet (5 mg total) by mouth 2 (two) times daily.   diltiazem  240 MG 24 hr capsule Commonly known as: CARDIZEM  CD Take 1 capsule (240 mg total) by mouth daily. What changed:  medication strength how much to take   gabapentin  100 MG capsule Commonly known as: NEURONTIN  Take 2 capsules (200 mg total)  by mouth 2 (two) times daily.   glipiZIDE  2.5 MG Tabs Take 2.5 mg by mouth daily.   lactulose  10 GM/15ML solution Commonly known as: CHRONULAC  Take 30 mLs (20 g total) by mouth daily.   losartan  50 MG tablet Commonly known as: COZAAR  Take 1 tablet (50 mg total) by mouth daily. What changed:  medication strength how much to take how to take this when to take this additional instructions   methocarbamol  750 MG tablet Commonly known as: ROBAXIN  Take 1 tablet (750 mg total) by mouth every 8 (eight) hours as needed for muscle spasms. What changed:  when to take this reasons to take this   metoprolol  tartrate 100 MG tablet Commonly known as: LOPRESSOR  Take 1 tablet (100 mg total) by mouth 2 (two) times daily.   Oxycodone  HCl 10 MG Tabs Take 1 tablet (10  mg total) by mouth every 6 (six) hours as needed for severe pain (pain score 7-10).   spironolactone  25 MG tablet Commonly known as: ALDACTONE  Take 0.5 tablets (12.5 mg total) by mouth daily.   Torsemide  40 MG Tabs Take 40 mg by mouth daily. Start taking on: October 18, 2023   vitamin B-12 500 MCG tablet Commonly known as: CYANOCOBALAMIN Take 500 mcg by mouth daily.        Follow-up Information     [facility] Follow up.   Specialty: Home Health Services Why: Agency will call you to set up apt time Contact information: [address] [place] [address] [phone]                Discharge Exam: Filed Weights   10/15/23 0457 10/16/23 0433 10/17/23 0502  Weight: (!) 141.1 kg (!) 137.6 kg (!) 140.8 kg   BP 128/88 (BP Location: Right Arm)   Pulse 71   Temp 98.3 F (36.8 C) (Oral)   Resp 19   Ht 5' 10 (1.778 m)   Wt (!) 140.8 kg   SpO2 96%   BMI 44.52 kg/m   Patient is feeling better, no chest pain or dyspnea, her back pain and chest pain is more controlled and her mobility has improved.   Neurology awake and alert ENT with no pallor Cardiovascular with S1 and S2 present and regular with no gallops, rubs or murmurs Respiratory with no rales or wheezing, no rhonchi  Abdomen with no distention, soft and non tender No lower extremity edema    Condition at discharge: stable  The results of significant diagnostics from this hospitalization (including imaging, microbiology, ancillary and laboratory) are listed below for reference.   Imaging Studies: MR THORACIC SPINE W WO CONTRAST Result Date: 10/15/2023 EXAM: MRI THORACIC SPINE WITH AND WITHOUT INTRAVENOUS CONTRAST 10/14/2023 08:42:18 PM TECHNIQUE: Multiplanar multisequence MRI of the thoracic spine was performed with and without the administration of intravenous contrast. COMPARISON: MRI of the lumbar spine without contrast 11/02/2023. CLINICAL HISTORY: Mid-back pain; Severe mid back  pain radiating to the left now, left lateral chest wall, known L1-L2 fracture. FINDINGS: BONES AND ALIGNMENT: Chronic L1 compression fracture and spinal augmentation are again noted. Remote superior endplate fracture of L2 is stable. Hemangioma is present at T11. SPINAL CORD: Normal spinal cord volume. Normal spinal cord signal. SOFT TISSUES: A 3.5 cm simple cyst is present at the upper pole of the left kidney. Recommend no imaging follow up. DEGENERATIVE CHANGES: Slight degenerative retrolisthesis is present at T7-8 and T8-9. Schmorl's nodes are present at T7-8, T8-9 and T9-10. Facet hypertrophy contributes  to mild right foraminal narrowing at T4-5, T9-10 and T10-11. Facet degenerative changes contribute to mild left foraminal narrowing at T6-7, T9-10 and T10-11. IMPRESSION: 1. No acute findings in the thoracic spine related to the mid-back pain. 2. Chronic L1 compression fracture and remote superior endplate fracture of L2, stable compared to prior study. Electronically signed by: Lonni Necessary MD 10/15/2023 04:36 AM EDT RP Workstation: [id number]   MR Lumbar Spine W Tommye Contrast Result Date: 10/15/2023 EXAM: MR Lumbar Spine with and without intravenous contrast. 10/14/2023 08:35:08 PM TECHNIQUE: Multiplanar multisequence MRI of the lumbar spine was performed with and without the administration of intravenous contrast. COMPARISON: None available CLINICAL HISTORY: Low back pain, symptoms persist with > 6 wks treatment. FINDINGS: BONES AND ALIGNMENT: Stable L1 compression fracture. Stable remote superior endplate fracture at L2. Retropulsed bone at L1 is stable and partially effaces the ventral CSF. A simple cyst of the left kidney measures 3.5 cm. SPINAL CORD: The conus terminates normally. SOFT TISSUES: No acute abnormality. L1-L2: Mild disc bulging without significant stenosis or change. Mild left foraminal narrowing at L1-L2 is stable. L2-L3: Mild disc bulging is stable without significant change or focal  stenosis. L3-L4: Mild disc bulging is stable without significant change or focal stenosis. L4-L5: Mild disc bulging is stable without significant change or focal stenosis. L5-S1: No disc herniation. No spinal canal stenosis or neural foraminal narrowing. IMPRESSION: 1. Stable L1 compression fracture with retropulsed bone partially effacing the ventral CSF. 2. Stable remote superior endplate fracture at L2. 3. Mild disc bulges at T12-L1, L2-3, L3-4, and L4-5 without significant stenosis or change. Electronically signed by: Lonni Necessary MD 10/15/2023 04:16 AM EDT RP Workstation: [id number]   CT Angio Chest Pulmonary Embolism (PE) W or WO Contrast Result Date: 10/12/2023 CLINICAL DATA:  Pulmonary embolus suspected with low to intermediate probability. Negative D-dimer. Leg swelling. EXAM: CT ANGIOGRAPHY CHEST WITH CONTRAST TECHNIQUE: Multidetector CT imaging of the chest was performed using the standard protocol during bolus administration of intravenous contrast. Multiplanar CT image reconstructions and MIPs were obtained to evaluate the vascular anatomy. RADIATION DOSE REDUCTION: This exam was performed according to the departmental dose-optimization program which includes automated exposure control, adjustment of the mA and/or kV according to patient size and/or use of iterative reconstruction technique. CONTRAST:  75mL OMNIPAQUE  IOHEXOL  350 MG/ML SOLN COMPARISON:  Chest radiograph 10/02/2023 and 02/24/2023 FINDINGS: Cardiovascular: Technically adequate study with good opacification of the central and segmental pulmonary arteries. No focal filling defects. No evidence of significant pulmonary embolus. Cardiac enlargement. Small pericardial effusions. Ascending aortic aneurysm measuring 4.9 cm diameter. No aortic dissection. Scattered aortic calcification. Scattered coronary artery calcification. Mediastinum/Nodes: Esophagus is decompressed. No significant lymphadenopathy. Thyroid gland is unremarkable.  Lungs/Pleura: Motion artifact limits examination. Patchy airspace infiltrates are suggested in the lung bases, possibly edema. Pneumonia less likely. No pleural effusion or pneumothorax. Upper Abdomen: No acute abnormalities. Musculoskeletal: Degenerative changes in the spine. Old compression deformity of T12 post kyphoplasty. No acute bony abnormalities. Review of the MIP images confirms the above findings. IMPRESSION: 1. No evidence of significant pulmonary embolus. 2. Cardiac enlargement with small pericardial effusion. 3. Patchy airspace changes in the lungs most likely edema. Pneumonia less likely. 4. 4.9 cm diameter ascending thoracic aortic aneurysm. Ascending thoracic aortic aneurysm. Recommend semi-annual imaging followup by CTA or MRA and referral to cardiothoracic surgery if not already obtained. This recommendation follows 2010 ACCF/AHA/AATS/ACR/ASA/SCA/SCAI/SIR/STS/SVM Guidelines for the Diagnosis and Management of Patients With Thoracic Aortic Disease. Circulation. 2010; 121: Z733-z630. Aortic aneurysm NOS (  ICD10-I71.9) Electronically Signed   By: Elsie Gravely M.D.   On: 10/12/2023 20:24   ECHOCARDIOGRAM COMPLETE Result Date: 10/04/2023    ECHOCARDIOGRAM REPORT   Patient Name:   Bob ELAINE Couzens Date of Exam: 10/04/2023 Medical Rec #:  [id number]           Height:       70.5 in Accession #:    [id number]          Weight:       330.5 lb Date of Birth:  1960-06-30           BSA:          2.598 m Patient Age:    62 years            BP:           135/81 mmHg Patient Gender: F                   HR:           100 bpm. Exam Location:  Inpatient Procedure: 2D Echo, Cardiac Doppler and Color Doppler (Both Spectral and Color            Flow Doppler were utilized during procedure). Indications:    CHF I50.31 Afib I48.91  History:        Patient has prior history of Echocardiogram examinations, most                 recent 02/25/2023.  Sonographer:    Tinnie Gosling RDCS Referring Phys: [id number] LOGAN N  [place] IMPRESSIONS  1. Left ventricular ejection fraction, by estimation, is 60 to 65%. The left ventricle has normal function. Left ventricular endocardial border not optimally defined to evaluate regional wall motion. There is mild concentric left ventricular hypertrophy. Left ventricular diastolic function could not be evaluated.  2. Right ventricular systolic function is normal. The right ventricular size is normal.  3. Left atrial size was mild to moderately dilated.  4. Right atrial size was mild to moderately dilated.  5. The mitral valve is normal in structure. No evidence of mitral valve regurgitation. No evidence of mitral stenosis.  6. The aortic valve was not well visualized. There is mild calcification of the aortic valve. Aortic valve regurgitation is not visualized. Aortic valve sclerosis/calcification is present, without any evidence of aortic stenosis.  7. The inferior vena cava is normal in size with greater than 50% respiratory variability, suggesting right atrial pressure of 3 mmHg. Conclusion(s)/Recommendation(s): Very poor image quality due to poor sound wave transmission. LV endocardium never seen well. FINDINGS  Left Ventricle: Left ventricular ejection fraction, by estimation, is 60 to 65%. The left ventricle has normal function. Left ventricular endocardial border not optimally defined to evaluate regional wall motion. The left ventricular internal cavity size was normal in size. There is mild concentric left ventricular hypertrophy. Left ventricular diastolic function could not be evaluated due to atrial fibrillation. Left ventricular diastolic function could not be evaluated. Right Ventricle: The right ventricular size is normal. No increase in right ventricular wall thickness. Right ventricular systolic function is normal. Left Atrium: Left atrial size was mild to moderately dilated. Right Atrium: Right atrial size was mild to moderately dilated. Pericardium: There is no evidence of  pericardial effusion. Mitral Valve: The mitral valve is normal in structure. No evidence of mitral valve regurgitation. No evidence of mitral valve stenosis. Tricuspid Valve: The tricuspid valve is not well visualized. Tricuspid valve regurgitation is trivial. No evidence  of tricuspid stenosis. Aortic Valve: The aortic valve was not well visualized. There is mild calcification of the aortic valve. Aortic valve regurgitation is not visualized. Aortic valve sclerosis/calcification is present, without any evidence of aortic stenosis. Pulmonic Valve: The pulmonic valve was not well visualized. Pulmonic valve regurgitation is trivial. No evidence of pulmonic stenosis. Aorta: The aortic root is normal in size and structure. Venous: The inferior vena cava is normal in size with greater than 50% respiratory variability, suggesting right atrial pressure of 3 mmHg. IAS/Shunts: No atrial level shunt detected by color flow Doppler.  LEFT VENTRICLE PLAX 2D LVIDd:         4.60 cm LVIDs:         3.30 cm LV PW:         1.30 cm LV IVS:        1.20 cm LVOT diam:     2.30 cm LV SV:         51 LV SV Index:   20 LVOT Area:     4.15 cm  IVC IVC diam: 2.30 cm LEFT ATRIUM           Index LA diam:      4.50 cm 1.73 cm/m LA Vol (A4C): 84.4 ml 32.49 ml/m  AORTIC VALVE LVOT Vmax:   67.10 cm/s LVOT Vmean:  44.600 cm/s LVOT VTI:    0.122 m  AORTA Ao Root diam: 3.20 cm Ao Asc diam:  3.70 cm  SHUNTS Systemic VTI:  0.12 m Systemic Diam: 2.30 cm Toribio Fuel MD Electronically signed by Toribio Fuel MD Signature Date/Time: 10/04/2023/8:59:01 AM    Final    VAS US  LOWER EXTREMITY VENOUS (DVT) Result Date: 10/03/2023  Lower Venous DVT Study Patient Name:  ELMARIE DEVLIN Shorb  Date of Exam:   10/03/2023 Medical Rec #: [id number]            Accession #:    [id number] Date of Birth: 05-06-1960            Patient Gender: F Patient Age:   78 years Exam Location:  [facility] Procedure:      VAS US  LOWER EXTREMITY VENOUS (DVT) Referring  Phys: TIMOTHY OPYD --------------------------------------------------------------------------------  Indications: Swelling, and Edema.  Risk Factors: DVT Hx of DVT. Limitations: Body habitus and Pt is unable to tolerate compression in groin and thigh area therefore the contralateral groin is not assessed. Pt screams loudly, tenses and holds breath. Performing Technologist: Elmarie Lindau, RVT  Examination Guidelines: A complete evaluation includes B-mode imaging, spectral Doppler, color Doppler, and power Doppler as needed of all accessible portions of each vessel. Bilateral testing is considered an integral part of a complete examination. Limited examinations for reoccurring indications may be performed as noted. The reflux portion of the exam is performed with the patient in reverse Trendelenburg.  +---------+---------------+---------+-----------+----------+---------------+ RIGHT    CompressibilityPhasicitySpontaneityPropertiesThrombus Aging  +---------+---------------+---------+-----------+----------+---------------+ CFV      Full           Yes      Yes                                  +---------+---------------+---------+-----------+----------+---------------+ SFJ      Full                                                         +---------+---------------+---------+-----------+----------+---------------+  FV Prox  Full                                                         +---------+---------------+---------+-----------+----------+---------------+ FV Mid                  Yes                           full color flow +---------+---------------+---------+-----------+----------+---------------+ FV Distal               Yes                           full color flow +---------+---------------+---------+-----------+----------+---------------+ PFV      Full                                                          +---------+---------------+---------+-----------+----------+---------------+ POP      Full           Yes      Yes                                  +---------+---------------+---------+-----------+----------+---------------+ PTV      Full                                                         +---------+---------------+---------+-----------+----------+---------------+ PERO     Full                                                         +---------+---------------+---------+-----------+----------+---------------+        Summary: RIGHT: - There is no evidence of deep vein thrombosis in the lower extremity.  - No cystic structure found in the popliteal fossa.   *See table(s) above for measurements and observations. Electronically signed by Debby Robertson on 10/03/2023 at 9:39:31 PM.    Final    MR LUMBAR SPINE WO CONTRAST Result Date: 10/03/2023 CLINICAL DATA:  Low back pain, prior surgery, new symptoms EXAM: MRI LUMBAR SPINE WITHOUT CONTRAST TECHNIQUE: Multiplanar, multisequence MR imaging of the lumbar spine was performed. No intravenous contrast was administered. COMPARISON:  Plain radiographs the lumbar spine dated October 03, 2023 and MRI of the lumbar spine dated February 26, 2023. FINDINGS: Segmentation:  Standard Alignment: Focal kyphosis at L1 secondary to marked vertebral compression deformity status post recent kyphoplasty. Vertebrae: Since the previous MRI, the patient has undergone bilateral kyphoplasty at L1. There has also been significant interval worsening of the compression fracture and worsening retropulsion of the posterior wall of the vertebral body, which now protrudes approximately 9 mm posteriorly, resulting in moderate central spinal canal stenosis, which is worse on the  left. There has also been interval development of a superior endplate compression deformity of L2, which appears to be chronic as there is no increased signal present on the STIR sequence. There are  hemangiomas present within the L3 and L4 vertebrae. Conus medullaris and cauda equina: Conus extends to the midbody of L1 level. Conus and cauda equina appear normal. Paraspinal and other soft tissues: There is a simple appearing cyst again seen arising posteriorly from the left kidney. No follow-up is necessary. The paraspinous soft tissues are otherwise unremarkable. Disc levels: T12-L1: There is a new small right paracentral protrusion seen best on the sagittal sequences, image 8. There is mild right-sided central spinal canal stenosis. The neural foramina are widely patent. L1-2: There is moderate central spinal canal stenosis secondary to retropulsion of the posteroinferior corner of the vertebral body. There is no impingement upon the conus medullaris or cauda equina. The neural foramina are patent. L2-3: Mild diffuse disc bulging and mild bilateral facet arthrosis with mild central spinal canal stenosis. The neural foramina are widely patent. L3-4: Normal. L4-5: Mild diffuse disc bulging, endplate ridging and facet hypertrophy, with mild central spinal canal stenosis and mild bilateral lateral recess stenosis. No apparent nerve root impingement. L5-S1: Normal. IMPRESSION: 1. Interval development of worsening central spinal canal stenosis at L1 secondary to interval worsening of a compression fracture at L1 and interval development of a superior endplate compression deformity of L2, which has lost approximately 20% of its height anteriorly. There is moderate focal kyphosis as a result. Electronically Signed   By: Evalene Coho M.D.   On: 10/03/2023 12:51   DG Lumbar Spine 2-3 Views Result Date: 10/03/2023 EXAM: 2 or 3 VIEW(S) XRAY OF THE LUMBAR SPINE 10/03/2023 04:26:00 AM COMPARISON: Lumbar spine radiographs. CLINICAL HISTORY: Pain, best images obtained due to patient condition and body habitus. FINDINGS: LUMBAR SPINE: BONES: Spinal augmentation is present at L1. The L1 fracture demonstrates  progressive loss of height since the prior exam. Methyl methacrylate is present outside of the vertebral body. A superior endplate fracture at L2 is new since the prior exam. Exaggerated kyphosis is present. DISCS AND DEGENERATIVE CHANGES: No severe degenerative changes. The discs are maintained. SOFT TISSUES: No acute abnormality. VASCULATURE: Atherosclerotic calcifications are present in the aorta. IMPRESSION: 1. Progressive loss of height of the L1 fracture with methyl methacrylate present outside of the vertebral body. 2. New superior endplate fracture at L2. 3. Exaggerated kyphosis. Electronically signed by: Lonni Necessary MD 10/03/2023 05:06 AM EDT RP Workstation: [id number]   DG Chest 2 View Result Date: 10/02/2023 CLINICAL DATA:  Shortness of breath EXAM: CHEST - 2 VIEW COMPARISON:  02/24/2023 FINDINGS: Cardiomegaly, vascular congestion. No overt edema, confluent opacities or effusions. No acute bony abnormality. IMPRESSION: Cardiomegaly, vascular congestion. Electronically Signed   By: Franky Crease M.D.   On: 10/02/2023 18:26    Microbiology: Results for orders placed or performed during the hospital encounter of 01/04/09  Urine culture     Status: None   Collection Time: 01/04/09 12:18 PM   Specimen: Urine, Random  Result Value Ref Range Status   Specimen Description URINE, RANDOM  Final   Special Requests NONE  Final   Colony Count 20,OOO COLONIES/ML  Final   Culture   Final    Multiple bacterial morphotypes present, none predominant. Suggest appropriate recollection if clinically indicated.   Report Status 01/05/2009 FINAL  Final    Labs: CBC: Recent Labs  Lab 10/13/23 0735  WBC 8.4  HGB 14.3  HCT 45.4  MCV 89.0  PLT 268   Basic Metabolic Panel: Recent Labs  Lab 10/11/23 0241 10/12/23 0222 10/13/23 0314 10/14/23 0314 10/15/23 0257 10/16/23 0300 10/17/23 0242  NA 134* 134* 135 132* 134* 131* 133*  K 4.2 4.6 4.4 5.2* 4.3 3.7 3.9  CL 97* 99 100 100 99 98 97*   CO2 29 28 27 23 26 26 30   GLUCOSE 174* 181* 194* 264* 246* 220* 227*  BUN 16 21 18 22  32* 32* 30*  CREATININE 0.90 0.92 1.03* 1.02* 1.10* 1.02* 1.02*  CALCIUM 8.8* 9.1 9.2 9.3 9.0 8.2* 8.4*  MG 1.9 1.6* 1.8  --   --   --   --    Liver Function Tests: No results for input(s): AST, ALT, ALKPHOS, BILITOT, PROT, ALBUMIN in the last 168 hours. CBG: Recent Labs  Lab 10/16/23 1117 10/16/23 1605 10/16/23 2131 10/17/23 0602 10/17/23 1201  GLUCAP 188* 152* 197* 213* 163*    Discharge time spent: greater than 30 minutes.  Signed: Elidia Toribio Furnace, MD [facility] 10/17/2023

## 2023-10-17 NOTE — Plan of Care (Signed)
  Problem: Coping: Goal: Ability to adjust to condition or change in health will improve Outcome: Progressing   Problem: Fluid Volume: Goal: Ability to maintain a balanced intake and output will improve Outcome: Progressing   Problem: Activity: Goal: Risk for activity intolerance will decrease Outcome: Progressing   Problem: Pain Managment: Goal: General experience of comfort will improve and/or be controlled Outcome: Progressing

## 2023-10-24 ENCOUNTER — Ambulatory Visit: Admitting: Physician Assistant

## 2023-10-30 DIAGNOSIS — I4891 Unspecified atrial fibrillation: Secondary | ICD-10-CM | POA: Diagnosis not present

## 2023-10-30 DIAGNOSIS — M545 Low back pain, unspecified: Secondary | ICD-10-CM | POA: Diagnosis not present

## 2023-10-30 DIAGNOSIS — E1169 Type 2 diabetes mellitus with other specified complication: Secondary | ICD-10-CM | POA: Diagnosis not present

## 2023-10-30 DIAGNOSIS — I7121 Aneurysm of the ascending aorta, without rupture: Secondary | ICD-10-CM | POA: Diagnosis not present

## 2023-10-30 DIAGNOSIS — I10 Essential (primary) hypertension: Secondary | ICD-10-CM | POA: Diagnosis not present

## 2023-10-30 DIAGNOSIS — Z9289 Personal history of other medical treatment: Secondary | ICD-10-CM | POA: Diagnosis not present

## 2023-10-30 DIAGNOSIS — S32000D Wedge compression fracture of unspecified lumbar vertebra, subsequent encounter for fracture with routine healing: Secondary | ICD-10-CM | POA: Diagnosis not present

## 2023-10-30 DIAGNOSIS — I5033 Acute on chronic diastolic (congestive) heart failure: Secondary | ICD-10-CM | POA: Diagnosis not present

## 2023-10-30 DIAGNOSIS — Z7409 Other reduced mobility: Secondary | ICD-10-CM | POA: Diagnosis not present

## 2023-10-30 DIAGNOSIS — Z79899 Other long term (current) drug therapy: Secondary | ICD-10-CM | POA: Diagnosis not present

## 2023-11-14 ENCOUNTER — Encounter

## 2023-11-14 DIAGNOSIS — E876 Hypokalemia: Secondary | ICD-10-CM | POA: Diagnosis not present

## 2023-11-23 ENCOUNTER — Emergency Department (HOSPITAL_COMMUNITY)

## 2023-11-23 ENCOUNTER — Inpatient Hospital Stay (HOSPITAL_COMMUNITY)
Admission: EM | Admit: 2023-11-23 | Discharge: 2023-12-01 | DRG: 291 | Disposition: A | Discharge status: Home Health | Attending: Internal Medicine | Admitting: Internal Medicine

## 2023-11-23 ENCOUNTER — Encounter (HOSPITAL_COMMUNITY): Payer: Self-pay

## 2023-11-23 ENCOUNTER — Other Ambulatory Visit: Payer: Self-pay

## 2023-11-23 DIAGNOSIS — E66813 Obesity, class 3: Secondary | ICD-10-CM | POA: Diagnosis not present

## 2023-11-23 DIAGNOSIS — G8929 Other chronic pain: Secondary | ICD-10-CM | POA: Diagnosis present

## 2023-11-23 DIAGNOSIS — W19XXXA Unspecified fall, initial encounter: Secondary | ICD-10-CM | POA: Diagnosis present

## 2023-11-23 DIAGNOSIS — Z90710 Acquired absence of both cervix and uterus: Secondary | ICD-10-CM

## 2023-11-23 DIAGNOSIS — I4821 Permanent atrial fibrillation: Secondary | ICD-10-CM | POA: Diagnosis present

## 2023-11-23 DIAGNOSIS — E876 Hypokalemia: Secondary | ICD-10-CM | POA: Diagnosis not present

## 2023-11-23 DIAGNOSIS — S32001A Stable burst fracture of unspecified lumbar vertebra, initial encounter for closed fracture: Secondary | ICD-10-CM | POA: Diagnosis present

## 2023-11-23 DIAGNOSIS — I4892 Unspecified atrial flutter: Secondary | ICD-10-CM | POA: Diagnosis present

## 2023-11-23 DIAGNOSIS — I509 Heart failure, unspecified: Principal | ICD-10-CM

## 2023-11-23 DIAGNOSIS — R609 Edema, unspecified: Secondary | ICD-10-CM | POA: Diagnosis not present

## 2023-11-23 DIAGNOSIS — I4891 Unspecified atrial fibrillation: Secondary | ICD-10-CM

## 2023-11-23 DIAGNOSIS — Z86718 Personal history of other venous thrombosis and embolism: Secondary | ICD-10-CM

## 2023-11-23 DIAGNOSIS — R0602 Shortness of breath: Secondary | ICD-10-CM | POA: Diagnosis not present

## 2023-11-23 DIAGNOSIS — K219 Gastro-esophageal reflux disease without esophagitis: Secondary | ICD-10-CM | POA: Diagnosis present

## 2023-11-23 DIAGNOSIS — I4819 Other persistent atrial fibrillation: Secondary | ICD-10-CM | POA: Diagnosis present

## 2023-11-23 DIAGNOSIS — I10 Essential (primary) hypertension: Secondary | ICD-10-CM | POA: Diagnosis not present

## 2023-11-23 DIAGNOSIS — E785 Hyperlipidemia, unspecified: Secondary | ICD-10-CM | POA: Diagnosis present

## 2023-11-23 DIAGNOSIS — I517 Cardiomegaly: Secondary | ICD-10-CM | POA: Diagnosis not present

## 2023-11-23 DIAGNOSIS — Z888 Allergy status to other drugs, medicaments and biological substances status: Secondary | ICD-10-CM

## 2023-11-23 DIAGNOSIS — Z8249 Family history of ischemic heart disease and other diseases of the circulatory system: Secondary | ICD-10-CM

## 2023-11-23 DIAGNOSIS — Z79899 Other long term (current) drug therapy: Secondary | ICD-10-CM

## 2023-11-23 DIAGNOSIS — Z7901 Long term (current) use of anticoagulants: Secondary | ICD-10-CM

## 2023-11-23 DIAGNOSIS — I5033 Acute on chronic diastolic (congestive) heart failure: Secondary | ICD-10-CM | POA: Diagnosis present

## 2023-11-23 DIAGNOSIS — Z885 Allergy status to narcotic agent status: Secondary | ICD-10-CM

## 2023-11-23 DIAGNOSIS — E871 Hypo-osmolality and hyponatremia: Secondary | ICD-10-CM | POA: Diagnosis present

## 2023-11-23 DIAGNOSIS — E1169 Type 2 diabetes mellitus with other specified complication: Secondary | ICD-10-CM | POA: Diagnosis not present

## 2023-11-23 DIAGNOSIS — M810 Age-related osteoporosis without current pathological fracture: Secondary | ICD-10-CM | POA: Diagnosis present

## 2023-11-23 DIAGNOSIS — I110 Hypertensive heart disease with heart failure: Secondary | ICD-10-CM | POA: Diagnosis not present

## 2023-11-23 DIAGNOSIS — Z7984 Long term (current) use of oral hypoglycemic drugs: Secondary | ICD-10-CM

## 2023-11-23 DIAGNOSIS — Z87891 Personal history of nicotine dependence: Secondary | ICD-10-CM

## 2023-11-23 DIAGNOSIS — R0989 Other specified symptoms and signs involving the circulatory and respiratory systems: Secondary | ICD-10-CM | POA: Diagnosis not present

## 2023-11-23 DIAGNOSIS — E119 Type 2 diabetes mellitus without complications: Secondary | ICD-10-CM

## 2023-11-23 DIAGNOSIS — E1165 Type 2 diabetes mellitus with hyperglycemia: Secondary | ICD-10-CM | POA: Diagnosis present

## 2023-11-23 DIAGNOSIS — Z6841 Body Mass Index (BMI) 40.0 and over, adult: Secondary | ICD-10-CM

## 2023-11-23 LAB — CBC WITH DIFFERENTIAL/PLATELET
Abs Immature Granulocytes: 0.02 K/uL (ref 0.00–0.07)
Basophils Absolute: 0 K/uL (ref 0.0–0.1)
Basophils Relative: 1 %
Eosinophils Absolute: 0.3 K/uL (ref 0.0–0.5)
Eosinophils Relative: 4 %
HCT: 45.9 % (ref 36.0–46.0)
Hemoglobin: 13.8 g/dL (ref 12.0–15.0)
Immature Granulocytes: 0 %
Lymphocytes Relative: 36 %
Lymphs Abs: 2.5 K/uL (ref 0.7–4.0)
MCH: 27.7 pg (ref 26.0–34.0)
MCHC: 30.1 g/dL (ref 30.0–36.0)
MCV: 92 fL (ref 80.0–100.0)
Monocytes Absolute: 0.4 K/uL (ref 0.1–1.0)
Monocytes Relative: 6 %
Neutro Abs: 3.6 K/uL (ref 1.7–7.7)
Neutrophils Relative %: 53 %
Platelets: 241 K/uL (ref 150–400)
RBC: 4.99 MIL/uL (ref 3.87–5.11)
RDW: 14.6 % (ref 11.5–15.5)
WBC: 6.8 K/uL (ref 4.0–10.5)
nRBC: 0 % (ref 0.0–0.2)

## 2023-11-23 NOTE — ED Triage Notes (Signed)
 Pt  BIB [facility] from home. Pt was recently discharged from the hospital for CHF, she started having weeping from BLLE. PCP instructed the pt to stop taking her torsemide  on 8/6. Pt began having SHOB, dyspnea on exertion and BLLE edema. 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

## 2023-11-23 NOTE — ED Notes (Signed)
 PIV was placed, line was flushed and blood return was noted. Pt demanded PIV was taken out, stated this hurts too much. PIV was removed.

## 2023-11-23 NOTE — ED Provider Notes (Signed)
  EMERGENCY DEPARTMENT AT [facility] Provider Note   CSN: [id number] Arrival date & time: 11/23/23  2232     Patient presents with: Shortness of Breath and Leg Swelling   Tracie [name] is a 63 y.o. female.   63 year old female with past medical history of A-fib, DVT (on Eliquis ), hypertension, hyperlipidemia, diabetes, obesity, celiac artery dissection presents via EMS from home with complaint of progressive worsening of swelling and shortness of breath.  She denies fevers or chest pain.  Patient states that she was admitted to the hospital a few weeks ago for CHF, was taking her torsemide  however began to have swelling in her legs, called her PCP who advised her to discontinue this medication on August 6, has had worsening swelling and shortness of breath since that time.  Shortness of breath is worse with exertion, O2 sats with EMS noted to be down as low as 92% while exerting, A-fib on monitor with rate of 90s to 120s.       Prior to Admission medications   Medication Sig Start Date End Date Taking? Authorizing Provider  acetaminophen  (TYLENOL ) 325 MG tablet Take 2 tablets (650 mg total) by mouth every 6 (six) hours as needed for mild pain (pain score 1-3) or moderate pain (pain score 4-6). 10/17/23   Arrien, Elidia Sieving, MD  apixaban  (ELIQUIS ) 5 MG TABS tablet Take 1 tablet (5 mg total) by mouth 2 (two) times daily. 10/17/23   Arrien, Elidia Sieving, MD  diltiazem  (CARDIZEM  CD) 240 MG 24 hr capsule Take 1 capsule (240 mg total) by mouth daily. Patient not taking: Reported on 11/24/2023 10/17/23   Arrien, Mauricio Daniel, MD  gabapentin  (NEURONTIN ) 100 MG capsule Take 2 capsules (200 mg total) by mouth 2 (two) times daily. 10/17/23   Arrien, Mauricio Daniel, MD  glipiZIDE  2.5 MG TABS Take 2.5 mg by mouth daily. 10/17/23 11/16/23  Arrien, Elidia Sieving, MD  lactulose  (CHRONULAC ) 10 GM/15ML solution Take 30 mLs (20 g total) by mouth daily. 10/12/23   Fairy Frames, MD  losartan  (COZAAR ) 50 MG tablet Take 1 tablet (50 mg total) by mouth daily. 10/17/23   Arrien, Mauricio Daniel, MD  methocarbamol  (ROBAXIN ) 750 MG tablet Take 1 tablet (750 mg total) by mouth every 8 (eight) hours as needed for muscle spasms. 10/17/23   Arrien, Mauricio Daniel, MD  metoprolol  tartrate (LOPRESSOR ) 100 MG tablet Take 1 tablet (100 mg total) by mouth 2 (two) times daily. 10/12/23   Fairy Frames, MD  Oxycodone  HCl 10 MG TABS Take 1 tablet (10 mg total) by mouth every 6 (six) hours as needed for severe pain (pain score 7-10). 10/12/23   Fairy Frames, MD  spironolactone  (ALDACTONE ) 25 MG tablet Take 0.5 tablets (12.5 mg total) by mouth daily. 10/12/23   Fairy Frames, MD  torsemide  (DEMADEX ) 20 MG tablet Take 2 tablets (40 mg total) by mouth daily. 10/18/23 11/17/23  Arrien, Mauricio Daniel, MD  vitamin B-12 (CYANOCOBALAMIN ) 500 MCG tablet Take 500 mcg by mouth daily.    [provider]    Allergies: Codeine, Crestor [rosuvastatin], Diltiazem , and Metoprolol  tartrate    Review of Systems Negative except as per HPI Updated Vital Signs BP (!) 141/70   Pulse 96   Temp 98.4 F (36.9 C) (Oral)   Resp 15   Ht 5' 10 (1.778 m)   Wt (!) 143.3 kg   SpO2 97%   BMI 45.34 kg/m   Physical Exam Vitals and nursing note reviewed.  Constitutional:  General: She is not in acute distress.    Appearance: She is well-developed. She is obese. She is not diaphoretic.  HENT:     Head: Normocephalic and atraumatic.  Cardiovascular:     Rate and Rhythm: Normal rate. Rhythm irregular.     Pulses: Normal pulses.  Pulmonary:     Effort: Pulmonary effort is normal.     Breath sounds: Examination of the right-lower field reveals decreased breath sounds. Examination of the left-lower field reveals decreased breath sounds. Decreased breath sounds present.  Abdominal:     Palpations: Abdomen is soft.     Tenderness: There is no abdominal tenderness.  Musculoskeletal:      Right lower leg: Tenderness present. Edema present.     Left lower leg: Tenderness present. Edema present.  Skin:    General: Skin is warm and dry.     Findings: No erythema or rash.  Neurological:     Mental Status: She is alert and oriented to person, place, and time.  Psychiatric:        Behavior: Behavior normal.     (all labs ordered are listed, but only abnormal results are displayed) Labs Reviewed  BASIC METABOLIC PANEL WITH GFR - Abnormal; Notable for the following components:      Result Value   Glucose, Bld 151 (*)    All other components within normal limits  BRAIN NATRIURETIC PEPTIDE - Abnormal; Notable for the following components:   B Natriuretic Peptide 290.4 (*)    All other components within normal limits  CBC WITH DIFFERENTIAL/PLATELET  TROPONIN I (HIGH SENSITIVITY)  TROPONIN I (HIGH SENSITIVITY)    EKG: None  Radiology: DG Chest Port 1 View Result Date: 11/23/2023 CLINICAL DATA:  Shortness of breath EXAM: PORTABLE CHEST 1 VIEW COMPARISON:  10/02/2023 FINDINGS: Cardiomegaly with mild central congestion. No focal opacity, pleural effusion, or pneumothorax. Possible small calcified loose bodies at the shoulder. Moderate degenerative changes of the shoulders. IMPRESSION: Cardiomegaly with mild central congestion. Electronically Signed   By: Luke Bun M.D.   On: 11/23/2023 23:17     Procedures   Medications Ordered in the ED  furosemide  (LASIX ) injection 60 mg (60 mg Intravenous Given 11/24/23 0138)                                    Medical Decision Making Amount and/or Complexity of Data Reviewed Labs: ordered. Radiology: ordered.  Risk Prescription drug management. Decision regarding hospitalization.   This patient presents to the ED for concern of edema/SHOB, this involves an extensive number of treatment options, and is a complaint that carries with it a high risk of complications and morbidity.  The differential diagnosis includes but not  limited to CHF, PE, PNA, ACS, electrolyte/metabolic, PNX, arrhythmia    Co morbidities / Chronic conditions that complicate the patient evaluation  As listed per HPI, reports compliance with meds/eliquis    Additional history obtained:  Additional history obtained from EMR History from EMS External records from outside source obtained and reviewed including DC summary dated 10/17/23    Lab Tests:  I Ordered, and personally interpreted labs.  The pertinent results include:  Troponin x 2 negative. BNP elevated at 290.  CBC WNL. BMP with elevated glucose at 151.    Imaging Studies ordered:  I ordered imaging studies including CXR  I independently visualized and interpreted imaging which showed cardiomegaly  I agree with the radiologist interpretation  Cardiac Monitoring: / EKG:  The patient was maintained on a cardiac monitor.  I personally viewed and interpreted the cardiac monitored which showed an underlying rhythm of: a fib, rate 100   Problem List / ED Course / Critical interventions / Medication management  63 year old female brought in by EMS from home with edema, SHOB, DOE. In patient last month, left improved after IV diuresis. Reports complaint with her meds, return of swelling, called PCP and was told to stop her Torsemide . Now with worsening edema and SHOB, no longer able to stand on her home scale due to swelling in her legs keeping her from getting both feet on the scale. Diminished lung bases, lower extremity edema. Provided with IV lasix , voiding, plan for admission for further IV diuresis.  I ordered medication including Lasix  Reevaluation of the patient after these medicines showed that the patient patient is voiding via pure wick.  Significant edema, was shortness of breath, worse with exertion.  Discussed admission for IV diuretics, patient is agreeable with plan. I have reviewed the patients home medicines and have made adjustments as needed   Consultations  Obtained:  I requested consultation with the Dr. Debby with [facility] service,  and discussed lab and imaging findings as well as pertinent plan - they recommend: will consult for admission   Social Determinants of Health:  Lives at home, has PCP/care team   Test / Admission - Considered:  Admit       Final diagnoses:  Acute on chronic congestive heart failure, unspecified heart failure type [facility])  Atrial fibrillation, unspecified type [facility])    ED Discharge Orders     None          Tracie [name] 11/24/23 0257    Palumbo, April, MD 11/24/23 [phone]

## 2023-11-23 NOTE — ED Notes (Signed)
 Pt refused collection of respiratory swab.

## 2023-11-24 DIAGNOSIS — Z6841 Body Mass Index (BMI) 40.0 and over, adult: Secondary | ICD-10-CM | POA: Diagnosis not present

## 2023-11-24 DIAGNOSIS — Z7901 Long term (current) use of anticoagulants: Secondary | ICD-10-CM | POA: Diagnosis not present

## 2023-11-24 DIAGNOSIS — Z79899 Other long term (current) drug therapy: Secondary | ICD-10-CM | POA: Diagnosis not present

## 2023-11-24 DIAGNOSIS — E876 Hypokalemia: Secondary | ICD-10-CM | POA: Diagnosis not present

## 2023-11-24 DIAGNOSIS — K219 Gastro-esophageal reflux disease without esophagitis: Secondary | ICD-10-CM | POA: Diagnosis not present

## 2023-11-24 DIAGNOSIS — Z8249 Family history of ischemic heart disease and other diseases of the circulatory system: Secondary | ICD-10-CM | POA: Diagnosis not present

## 2023-11-24 DIAGNOSIS — Z888 Allergy status to other drugs, medicaments and biological substances status: Secondary | ICD-10-CM | POA: Diagnosis not present

## 2023-11-24 DIAGNOSIS — I517 Cardiomegaly: Secondary | ICD-10-CM | POA: Diagnosis not present

## 2023-11-24 DIAGNOSIS — I4891 Unspecified atrial fibrillation: Secondary | ICD-10-CM | POA: Diagnosis not present

## 2023-11-24 DIAGNOSIS — Z7984 Long term (current) use of oral hypoglycemic drugs: Secondary | ICD-10-CM | POA: Diagnosis not present

## 2023-11-24 DIAGNOSIS — E871 Hypo-osmolality and hyponatremia: Secondary | ICD-10-CM | POA: Diagnosis not present

## 2023-11-24 DIAGNOSIS — I5033 Acute on chronic diastolic (congestive) heart failure: Secondary | ICD-10-CM | POA: Diagnosis not present

## 2023-11-24 DIAGNOSIS — I4892 Unspecified atrial flutter: Secondary | ICD-10-CM | POA: Diagnosis not present

## 2023-11-24 DIAGNOSIS — Z86718 Personal history of other venous thrombosis and embolism: Secondary | ICD-10-CM | POA: Diagnosis not present

## 2023-11-24 DIAGNOSIS — Z87891 Personal history of nicotine dependence: Secondary | ICD-10-CM | POA: Diagnosis not present

## 2023-11-24 DIAGNOSIS — Z885 Allergy status to narcotic agent status: Secondary | ICD-10-CM | POA: Diagnosis not present

## 2023-11-24 DIAGNOSIS — M810 Age-related osteoporosis without current pathological fracture: Secondary | ICD-10-CM | POA: Diagnosis not present

## 2023-11-24 DIAGNOSIS — I4819 Other persistent atrial fibrillation: Secondary | ICD-10-CM | POA: Diagnosis not present

## 2023-11-24 DIAGNOSIS — E66813 Obesity, class 3: Secondary | ICD-10-CM | POA: Diagnosis not present

## 2023-11-24 DIAGNOSIS — Z90710 Acquired absence of both cervix and uterus: Secondary | ICD-10-CM | POA: Diagnosis not present

## 2023-11-24 DIAGNOSIS — S32001A Stable burst fracture of unspecified lumbar vertebra, initial encounter for closed fracture: Secondary | ICD-10-CM | POA: Diagnosis not present

## 2023-11-24 DIAGNOSIS — R0989 Other specified symptoms and signs involving the circulatory and respiratory systems: Secondary | ICD-10-CM | POA: Diagnosis not present

## 2023-11-24 DIAGNOSIS — R0602 Shortness of breath: Secondary | ICD-10-CM | POA: Diagnosis not present

## 2023-11-24 DIAGNOSIS — I110 Hypertensive heart disease with heart failure: Secondary | ICD-10-CM | POA: Diagnosis not present

## 2023-11-24 DIAGNOSIS — G8929 Other chronic pain: Secondary | ICD-10-CM | POA: Diagnosis not present

## 2023-11-24 DIAGNOSIS — I509 Heart failure, unspecified: Secondary | ICD-10-CM | POA: Insufficient documentation

## 2023-11-24 DIAGNOSIS — E785 Hyperlipidemia, unspecified: Secondary | ICD-10-CM | POA: Diagnosis not present

## 2023-11-24 DIAGNOSIS — E1169 Type 2 diabetes mellitus with other specified complication: Secondary | ICD-10-CM | POA: Diagnosis not present

## 2023-11-24 DIAGNOSIS — I10 Essential (primary) hypertension: Secondary | ICD-10-CM | POA: Diagnosis not present

## 2023-11-24 DIAGNOSIS — I4821 Permanent atrial fibrillation: Secondary | ICD-10-CM | POA: Diagnosis not present

## 2023-11-24 DIAGNOSIS — W19XXXA Unspecified fall, initial encounter: Secondary | ICD-10-CM | POA: Diagnosis present

## 2023-11-24 DIAGNOSIS — E1165 Type 2 diabetes mellitus with hyperglycemia: Secondary | ICD-10-CM | POA: Diagnosis not present

## 2023-11-24 LAB — GLUCOSE, CAPILLARY
Glucose-Capillary: 202 mg/dL — ABNORMAL HIGH (ref 70–99)
Glucose-Capillary: 173 mg/dL — ABNORMAL HIGH (ref 70–99)
Glucose-Capillary: 163 mg/dL — ABNORMAL HIGH (ref 70–99)
Glucose-Capillary: 163 mg/dL — ABNORMAL HIGH (ref 70–99)

## 2023-11-24 LAB — COMPREHENSIVE METABOLIC PANEL WITH GFR
ALT: 12 U/L (ref 0–44)
AST: 22 U/L (ref 15–41)
Albumin: 3.1 g/dL — ABNORMAL LOW (ref 3.5–5.0)
Alkaline Phosphatase: 60 U/L (ref 38–126)
Anion gap: 9 (ref 5–15)
BUN: 9 mg/dL (ref 8–23)
CO2: 25 mmol/L (ref 22–32)
Calcium: 9.1 mg/dL (ref 8.9–10.3)
Chloride: 106 mmol/L (ref 98–111)
Creatinine, Ser: 0.81 mg/dL (ref 0.44–1.00)
GFR, Estimated: >60 mL/min (ref >60)
Glucose, Bld: 164 mg/dL — ABNORMAL HIGH (ref 70–99)
Potassium: 3.4 mmol/L — ABNORMAL LOW (ref 3.5–5.1)
Sodium: 140 mmol/L (ref 135–145)
Total Bilirubin: 0.4 mg/dL (ref 0.0–1.2)
Total Protein: 7.7 g/dL (ref 6.5–8.1)

## 2023-11-24 LAB — CBC
HCT: 47.8 % — ABNORMAL HIGH (ref 36.0–46.0)
Hemoglobin: 14.6 g/dL (ref 12.0–15.0)
MCH: 27.9 pg (ref 26.0–34.0)
MCHC: 30.5 g/dL (ref 30.0–36.0)
MCV: 91.4 fL (ref 80.0–100.0)
Platelets: 248 K/uL (ref 150–400)
RBC: 5.23 MIL/uL — ABNORMAL HIGH (ref 3.87–5.11)
RDW: 14.6 % (ref 11.5–15.5)
WBC: 6.6 K/uL (ref 4.0–10.5)
nRBC: 0 % (ref 0.0–0.2)

## 2023-11-24 LAB — BRAIN NATRIURETIC PEPTIDE: B Natriuretic Peptide: 290.4 pg/mL — ABNORMAL HIGH (ref 0.0–100.0)

## 2023-11-24 LAB — BASIC METABOLIC PANEL WITH GFR
Anion gap: 8 (ref 5–15)
BUN: 13 mg/dL (ref 8–23)
CO2: 23 mmol/L (ref 22–32)
Calcium: 9.1 mg/dL (ref 8.9–10.3)
Chloride: 107 mmol/L (ref 98–111)
Creatinine, Ser: 0.88 mg/dL (ref 0.44–1.00)
GFR, Estimated: >60 mL/min (ref >60)
Glucose, Bld: 151 mg/dL — ABNORMAL HIGH (ref 70–99)
Potassium: 4.5 mmol/L (ref 3.5–5.1)
Sodium: 138 mmol/L (ref 135–145)

## 2023-11-24 LAB — TROPONIN I (HIGH SENSITIVITY)
Troponin I (High Sensitivity): 5 ng/L (ref <18)
Troponin I (High Sensitivity): 5 ng/L (ref <18)

## 2023-11-24 LAB — MRSA NEXT GEN BY PCR, NASAL: MRSA by PCR Next Gen: NOT DETECTED

## 2023-11-24 MED ORDER — INSULIN ASPART 100 UNIT/ML IJ SOLN
0.0000 [IU] | Freq: Three times a day (TID) | INTRAMUSCULAR | Status: DC
  Administered 2023-11-24 (×2): 2 [IU] via SUBCUTANEOUS
  Administered 2023-11-24 – 2023-11-25 (×3): 1 [IU] via SUBCUTANEOUS
  Administered 2023-11-25: 3 [IU] via SUBCUTANEOUS
  Administered 2023-11-26 (×2): 1 [IU] via SUBCUTANEOUS
  Administered 2023-11-26: 2 [IU] via SUBCUTANEOUS
  Administered 2023-11-27 (×2): 1 [IU] via SUBCUTANEOUS
  Administered 2023-11-27: 2 [IU] via SUBCUTANEOUS
  Administered 2023-11-28 – 2023-11-30 (×8): 1 [IU] via SUBCUTANEOUS
  Administered 2023-11-30: 2 [IU] via SUBCUTANEOUS
  Administered 2023-12-01 (×2): 1 [IU] via SUBCUTANEOUS

## 2023-11-24 MED ORDER — APIXABAN 5 MG PO TABS
5.0000 mg | ORAL_TABLET | Freq: Two times a day (BID) | ORAL | Status: DC
  Administered 2023-11-24 – 2023-12-01 (×15): 5 mg via ORAL
  Filled 2023-11-24 (×15): qty 1

## 2023-11-24 MED ORDER — ONDANSETRON HCL 4 MG PO TABS
4.0000 mg | ORAL_TABLET | Freq: Four times a day (QID) | ORAL | Status: DC | PRN
Start: 2023-11-24 — End: 2023-12-01

## 2023-11-24 MED ORDER — GABAPENTIN 100 MG PO CAPS
200.0000 mg | ORAL_CAPSULE | Freq: Two times a day (BID) | ORAL | Status: DC
  Administered 2023-11-24 – 2023-12-01 (×15): 200 mg via ORAL
  Filled 2023-11-24 (×15): qty 2

## 2023-11-24 MED ORDER — FUROSEMIDE 10 MG/ML IJ SOLN
60.0000 mg | Freq: Once | INTRAMUSCULAR | Status: AC
  Administered 2023-11-24: 60 mg via INTRAVENOUS
  Filled 2023-11-24: qty 6

## 2023-11-24 MED ORDER — FUROSEMIDE 10 MG/ML IJ SOLN
80.0000 mg | Freq: Two times a day (BID) | INTRAMUSCULAR | Status: DC
  Administered 2023-11-24 (×2): 80 mg via INTRAVENOUS
  Filled 2023-11-24 (×2): qty 8

## 2023-11-24 MED ORDER — METHOCARBAMOL 500 MG PO TABS
750.0000 mg | ORAL_TABLET | Freq: Three times a day (TID) | ORAL | Status: DC | PRN
  Administered 2023-11-24 – 2023-12-01 (×16): 750 mg via ORAL
  Filled 2023-11-24 (×17): qty 2

## 2023-11-24 MED ORDER — LEVALBUTEROL HCL 0.63 MG/3ML IN NEBU: 0.6300 mg | INHALATION_SOLUTION | Freq: Four times a day (QID) | RESPIRATORY_TRACT | Status: DC | PRN

## 2023-11-24 MED ORDER — ONDANSETRON HCL 4 MG/2ML IJ SOLN: 4.0000 mg | Freq: Four times a day (QID) | INTRAMUSCULAR | Status: DC | PRN

## 2023-11-24 MED ORDER — OXYCODONE HCL 5 MG PO TABS
10.0000 mg | ORAL_TABLET | Freq: Four times a day (QID) | ORAL | Status: DC | PRN
  Administered 2023-11-24 – 2023-11-27 (×11): 10 mg via ORAL
  Filled 2023-11-24 (×11): qty 2

## 2023-11-24 MED ORDER — ACETAMINOPHEN 650 MG RE SUPP: 650.0000 mg | Freq: Four times a day (QID) | RECTAL | Status: DC | PRN

## 2023-11-24 MED ORDER — ACETAMINOPHEN 325 MG PO TABS
650.0000 mg | ORAL_TABLET | Freq: Four times a day (QID) | ORAL | Status: DC | PRN
  Administered 2023-11-24 – 2023-11-29 (×6): 650 mg via ORAL
  Filled 2023-11-24 (×6): qty 2

## 2023-11-24 MED ORDER — SPIRONOLACTONE 12.5 MG HALF TABLET
12.5000 mg | ORAL_TABLET | Freq: Every day | ORAL | Status: DC
Start: 2023-11-24 — End: 2023-11-24

## 2023-11-24 MED ORDER — EMPAGLIFLOZIN 25 MG PO TABS
25.0000 mg | ORAL_TABLET | Freq: Every day | ORAL | Status: DC
  Administered 2023-11-24 – 2023-11-25 (×2): 25 mg via ORAL
  Filled 2023-11-24 (×3): qty 1

## 2023-11-24 MED ORDER — LOSARTAN POTASSIUM 50 MG PO TABS
50.0000 mg | ORAL_TABLET | Freq: Every day | ORAL | Status: DC
  Administered 2023-11-24 – 2023-12-01 (×7): 50 mg via ORAL
  Filled 2023-11-24 (×8): qty 1

## 2023-11-24 MED ORDER — FUROSEMIDE 10 MG/ML IJ SOLN
60.0000 mg | Freq: Two times a day (BID) | INTRAMUSCULAR | Status: DC
  Administered 2023-11-24: 60 mg via INTRAVENOUS
  Filled 2023-11-24: qty 6

## 2023-11-24 MED ORDER — LACTULOSE 10 GM/15ML PO SOLN
20.0000 g | Freq: Every day | ORAL | Status: DC
  Administered 2023-11-24 – 2023-11-29 (×3): 20 g via ORAL
  Filled 2023-11-24 (×7): qty 30

## 2023-11-24 MED ORDER — HYDROCORTISONE 1 % EX CREA
TOPICAL_CREAM | CUTANEOUS | Status: DC | PRN
  Filled 2023-11-24: qty 28

## 2023-11-24 MED ORDER — SPIRONOLACTONE 25 MG PO TABS
25.0000 mg | ORAL_TABLET | Freq: Every day | ORAL | Status: DC
  Administered 2023-11-24 – 2023-11-29 (×6): 25 mg via ORAL
  Filled 2023-11-24 (×6): qty 1

## 2023-11-24 MED ORDER — POTASSIUM CHLORIDE CRYS ER 20 MEQ PO TBCR
40.0000 meq | EXTENDED_RELEASE_TABLET | Freq: Two times a day (BID) | ORAL | Status: AC
Start: 2023-11-24 — End: 2023-11-24
  Administered 2023-11-24 (×2): 40 meq via ORAL
  Filled 2023-11-24 (×2): qty 2

## 2023-11-24 MED ORDER — CYANOCOBALAMIN 500 MCG PO TABS
500.0000 ug | ORAL_TABLET | Freq: Every day | ORAL | Status: DC
  Administered 2023-11-24 – 2023-12-01 (×8): 500 ug via ORAL
  Filled 2023-11-24 (×8): qty 1

## 2023-11-24 MED ORDER — METOPROLOL SUCCINATE ER 100 MG PO TB24
100.0000 mg | ORAL_TABLET | Freq: Every day | ORAL | Status: DC
  Administered 2023-11-24 – 2023-12-01 (×7): 100 mg via ORAL
  Filled 2023-11-24 (×8): qty 1

## 2023-11-24 NOTE — H&P (Signed)
 History and Physical    Tracie [name] FMW:[id number] DOB: Dec 13, 1960 DOA: 11/23/2023  PCP: Rolinda Millman, MD  Patient coming from: home  I have personally briefly reviewed patient's old medical records in [facility]  Chief Complaint: sob/ lower extremity swelling  HPI: Tracie [name] is a 63 y.o. female with medical history significant of Atrial fibrillation, DMII,  celiac a dissection , s/p dvt , HLD HTN , chronic low back pain, morbid obesity , who presents to ED BIB EMS with complaint of b/l lower extremity swelling as well as SHOB/DOE. Patient states s/p last hospitalization 6/25-7/52025 at which time she was diagnosed with CHF she has note progressive weeping and swelling of her lower extremities. Per patient she discussed symptoms with MD who recommended holding lasix  as concern allergic reaction.She states over the last few days she has noted SHOB/DOE and continued progression of b/l lower extremity swelling. Due to these symptoms she called. EMS. Patient notes no associated chest pain /fever/ chills/ n/v/d /fever or chills.   ED Course:  AFEB, BP 160/92, HR 57, RR 24, SAT 100%  EKG: Atrial fibrillation @100  CXR FINDINGS: Cardiomegaly with mild central congestion. No focal opacity, pleural effusion, or pneumothorax. Possible small calcified loose bodies at the shoulder. Moderate degenerative changes of the shoulders.   IMPRESSION: Cardiomegaly with mild central congestion.  Wbc 6.8, hgb 13.8, plt 241 BNP 290.4 Na 138, K 4.5, Cl 107, bicarb 23, glu 151, cr 0.88 Tx  Lasix  60 mg tolerated well Review of Systems: As per HPI otherwise 10 point review of systems negative.   Past Medical History:  Diagnosis Date   Atrial fibrillation (HCC)    Celiac artery dissection (HCC)    Diabetes mellitus (HCC)    History of DVT (deep vein thrombosis) 2010   Hyperlipidemia    Hypertension    Morbid obesity (HCC)     Past Surgical History:  Procedure Laterality Date    ABDOMINAL HYSTERECTOMY     FRACTURE SURGERY Left    hand   IR KYPHO LUMBAR INC FX REDUCE BONE BX UNI/BIL CANNULATION INC/IMAGING  03/04/2023   KNEE ARTHROSCOPY Right 1990s     reports that she quit smoking about 15 years ago. Her smoking use included cigarettes. She started smoking about 35 years ago. She has a 20 pack-year smoking history. She has never used smokeless tobacco. She reports that she does not currently use drugs. She reports that she does not drink alcohol.  Allergies  Allergen Reactions   Codeine Other (See Comments)    Unknown reaction   Crestor [Rosuvastatin] Other (See Comments)    Myalgias  Weakness    Family History  Problem Relation Age of Onset   Hypertension Mother    Heart attack Father    Heart disease Brother    Healthy Son    Breast cancer Neg Hx     Prior to Admission medications   Medication Sig Start Date End Date Taking? Authorizing Provider  acetaminophen  (TYLENOL ) 325 MG tablet Take 2 tablets (650 mg total) by mouth every 6 (six) hours as needed for mild pain (pain score 1-3) or moderate pain (pain score 4-6). 10/17/23   Arrien, Elidia Sieving, MD  apixaban  (ELIQUIS ) 5 MG TABS tablet Take 1 tablet (5 mg total) by mouth 2 (two) times daily. 10/17/23   Arrien, Elidia Sieving, MD  diltiazem  (CARDIZEM  CD) 240 MG 24 hr capsule Take 1 capsule (240 mg total) by mouth daily. 10/17/23   Arrien, Elidia Sieving, MD  gabapentin  (NEURONTIN ) 100 MG capsule Take 2 capsules (200 mg total) by mouth 2 (two) times daily. 10/17/23   Arrien, Elidia Sieving, MD  glipiZIDE  2.5 MG TABS Take 2.5 mg by mouth daily. 10/17/23 11/16/23  Arrien, Elidia Sieving, MD  lactulose  (CHRONULAC ) 10 GM/15ML solution Take 30 mLs (20 g total) by mouth daily. 10/12/23   Fairy Frames, MD  losartan  (COZAAR ) 50 MG tablet Take 1 tablet (50 mg total) by mouth daily. 10/17/23   Arrien, Mauricio Daniel, MD  methocarbamol  (ROBAXIN ) 750 MG tablet Take 1 tablet (750 mg total) by mouth every 8  (eight) hours as needed for muscle spasms. 10/17/23   Arrien, Mauricio Daniel, MD  metoprolol  tartrate (LOPRESSOR ) 100 MG tablet Take 1 tablet (100 mg total) by mouth 2 (two) times daily. 10/12/23   Fairy Frames, MD  Oxycodone  HCl 10 MG TABS Take 1 tablet (10 mg total) by mouth every 6 (six) hours as needed for severe pain (pain score 7-10). 10/12/23   Fairy Frames, MD  spironolactone  (ALDACTONE ) 25 MG tablet Take 0.5 tablets (12.5 mg total) by mouth daily. 10/12/23   Fairy Frames, MD  torsemide  (DEMADEX ) 20 MG tablet Take 2 tablets (40 mg total) by mouth daily. 10/18/23 11/17/23  Arrien, Mauricio Daniel, MD  vitamin B-12 (CYANOCOBALAMIN ) 500 MCG tablet Take 500 mcg by mouth daily.    [provider]    Physical Exam: Vitals:   11/23/23 2242 11/23/23 2300 11/24/23 0140 11/24/23 0230  BP:  134/79 (!) 164/103 (!) 141/70  Pulse:  (!) 104 67 96  Resp:  10 (!) 26 15  Temp: 98.4 F (36.9 C)     TempSrc: Oral     SpO2:  95% 99% 97%  Weight:      Height:        Constitutional: NAD, calm, comfortable Vitals:   11/23/23 2242 11/23/23 2300 11/24/23 0140 11/24/23 0230  BP:  134/79 (!) 164/103 (!) 141/70  Pulse:  (!) 104 67 96  Resp:  10 (!) 26 15  Temp: 98.4 F (36.9 C)     TempSrc: Oral     SpO2:  95% 99% 97%  Weight:      Height:       Eyes: PERRL, lids and conjunctivae normal ENMT: Mucous membranes are moist. Posterior pharynx clear of any exudate or lesions.Normal dentition.  Neck: normal, supple, no masses, no thyromegaly Respiratory: diminished Normal respiratory effort. No accessory muscle use.  Cardiovascular: irRegular rate and rhythm, no murmurs / rubs / gallops. +extremity edema. Abdomen: no tenderness, no masses palpated. No hepatosplenomegaly. Bowel sounds positive.  Musculoskeletal: no clubbing / cyanosis. No joint deformity upper and lower extremities. Good ROM, no contractures. Normal muscle tone.  Skin: +rashes lower ext healing stage. No  induration Neurologic: CN 2-12 grossly intact. Sensation intact, .MAE in all 4.  Psychiatric: Normal judgment and insight. Alert and oriented x 3. Normal mood.    Labs on Admission: I have personally reviewed following labs and imaging studies  CBC: Recent Labs  Lab 11/23/23 2317  WBC 6.8  NEUTROABS 3.6  HGB 13.8  HCT 45.9  MCV 92.0  PLT 241   Basic Metabolic Panel: Recent Labs  Lab 11/23/23 2317  NA 138  K 4.5  CL 107  CO2 23  GLUCOSE 151*  BUN 13  CREATININE 0.88  CALCIUM 9.1   GFR: Estimated Creatinine Clearance: 99 mL/min (by C-G formula based on SCr of 0.88 mg/dL). Liver Function Tests: No results for input(s): AST, ALT, ALKPHOS,  BILITOT, PROT, ALBUMIN in the last 168 hours. No results for input(s): LIPASE, AMYLASE in the last 168 hours. No results for input(s): AMMONIA in the last 168 hours. Coagulation Profile: No results for input(s): INR, PROTIME in the last 168 hours. Cardiac Enzymes: No results for input(s): CKTOTAL, CKMB, CKMBINDEX, TROPONINI in the last 168 hours. BNP (last 3 results) No results for input(s): PROBNP in the last 8760 hours. HbA1C: No results for input(s): HGBA1C in the last 72 hours. CBG: No results for input(s): GLUCAP in the last 168 hours. Lipid Profile: No results for input(s): CHOL, HDL, LDLCALC, TRIG, CHOLHDL, LDLDIRECT in the last 72 hours. Thyroid  Function Tests: No results for input(s): TSH, T4TOTAL, FREET4, T3FREE, THYROIDAB in the last 72 hours. Anemia Panel: No results for input(s): VITAMINB12, FOLATE, FERRITIN, TIBC, IRON, RETICCTPCT in the last 72 hours. Urine analysis:    Component Value Date/Time   COLORURINE YELLOW 02/24/2023 0518   APPEARANCEUR CLEAR 02/24/2023 0518   APPEARANCEUR Clear 07/16/2018 1126   LABSPEC 1.030 02/24/2023 0518   PHURINE 5.0 02/24/2023 0518   GLUCOSEU >=500 (A) 02/24/2023 0518   HGBUR NEGATIVE 02/24/2023 0518    BILIRUBINUR NEGATIVE 02/24/2023 0518   BILIRUBINUR Negative 07/16/2018 1126   KETONESUR 5 (A) 02/24/2023 0518   PROTEINUR NEGATIVE 02/24/2023 0518   UROBILINOGEN 0.2 01/04/2009 1218   NITRITE NEGATIVE 02/24/2023 0518   LEUKOCYTESUR NEGATIVE 02/24/2023 0518    Radiological Exams on Admission: DG Chest Port 1 View Result Date: 11/23/2023 CLINICAL DATA:  Shortness of breath EXAM: PORTABLE CHEST 1 VIEW COMPARISON:  10/02/2023 FINDINGS: Cardiomegaly with mild central congestion. No focal opacity, pleural effusion, or pneumothorax. Possible small calcified loose bodies at the shoulder. Moderate degenerative changes of the shoulders. IMPRESSION: Cardiomegaly with mild central congestion. Electronically Signed   By: Luke Bun M.D.   On: 11/23/2023 23:17    EKG: Independently reviewed.   Assessment/Plan  Acute on Chronic CHFpef  -EF 60-68% -insetting of holding lasix  per pcp request -admit to cardiac tele -lasix  60 mg iv bid  -recent echo no need for update -ekg without hyperacute st -twave changes  -resume GDMT  Atrial fibrillation  -continue lopressor , cardizem   -continue Eliquis     DMII -last A1C 6.2  - iss/fs   S/p dvt  -s/p treatment   HLD -continue statin    HTN  -stable resume home regimen once med rec completed  - prn anti-hypertensive    Chronic low back pain -hx of L1/LE compression fracture    Morbid obesity     DVT prophylaxis:  on Eliquis  Code Status: full/ as discussed per patient wishes in event of cardiac arrest  Family Communication: none at bedside Disposition Plan: patient  expected to be admitted greater than 2 midnights  Consults called: consider cardiology in am  Admission status: cardiac tele   Camila DELENA Ned MD [facility]   If 7PM-7AM, please contact night-coverage [email or web address] Password [facility]  11/24/2023, 2:47 AM

## 2023-11-24 NOTE — Progress Notes (Addendum)
 PROGRESS NOTE    Tracie [name]  FMW:[id number] DOB: 1960/08/24 DOA: 11/23/2023 PCP: Rolinda Millman, MD  Morbidly obese 62/F with type 2 diabetes, hypertension, history of DVT, paroxysmal A-fib on Eliquis , chronic diastolic CHF, chronic back pain recently hospitalized 6/26>7/10, for severe volume overload, complicated by worsening back pain, lumbar compression fractures etc. she was discharged home on torsemide . - Reports progressive edema 3 weeks after discharge, reports initial compliance with torsemide , then developed a rash/skin infection in her lower legs, called her PCP who advised her to hold torsemide , subsequently had worsening swelling while off diuretics for 1 week back in the emergency room 8/16. - In the ER hypertensive, rate controlled A-fib, chest x-ray with cardiomegaly pulmonary vascular congestion, creatinine 0.8, BNP 290   Subjective: -Bothered by significant swelling  Assessment and Plan:  Acute on chronic diastolic heart failure: Recent echo EF 60-65%.,  Indeterminate diastolic function, normal RV - significantly volume overloaded, stopped diuretics a week ago after skin infection  -continue Lasix , increased dose to 80 mg twice daily - Continue Lopressor , Aldactone , losartan  - Currently on Jardiance , may be a poor candidate, low threshold to discontinue  Permanent atrial fibrillation -Continue Lopressor , Eliquis , heart rate is controlled   Hypokalemia Replaced.   Chronic back pain, h/o L1 compression fracture- H/o kyphoplasty 02/2023 Chronic osteoporosis as well as arthritis. - Supportive care   Morbid obesity Body mass index is 46.06 kg/m.  Complicating further her current condition. Diet, exercise and weight reduction advised. Concern for obesity hypoventilation syndrome is also high.   -She needs a sleep study   Type 2 diabetes mellitus.  Well-controlled without long-term insulin  use without complication. Hemoglobin A1c 6.2.  Currently on  glipizide  2.5 mg daily.  -Continue sliding scale  DVT prophylaxis: Eliquis  Code Status: Full code Family Communication: None present Disposition Plan: Home pending improvement in volume status  Consultants:    Procedures:   Antimicrobials:    Objective: Vitals:   11/24/23 0230 11/24/23 0352 11/24/23 0647 11/24/23 0742  BP: (!) 141/70  137/80 (!) 145/94  Pulse: 96  (!) 117 (!) 109  Resp: 15  18 20   Temp:  98.1 F (36.7 C) 97.8 F (36.6 C) 97.8 F (36.6 C)  TempSrc:  Axillary Oral Oral  SpO2: 97%  94% 92%  Weight:   (!) 144 kg   Height:   5' 10 (1.778 m)     Intake/Output Summary (Last 24 hours) at 11/24/2023 1028 Last data filed at 11/24/2023 0956 Gross per 24 hour  Intake --  Output 3700 ml  Net -3700 ml   Filed Weights   11/23/23 2240 11/24/23 0647  Weight: (!) 143.3 kg (!) 144 kg    Examination:  General exam: Appears calm and comfortable morbidly obese, AAO x 3 Respiratory system: Decreased breath sounds at the bases Cardiovascular system: S1 & S2 heard, irregular rhythm Abd: nondistended, soft and nontender.Normal bowel sounds heard. Central nervous system: Alert and oriented. No focal neurological deficits. Extremities: 2+ edema, mild maculopapular lesions in both lower legs Skin: As above Psychiatry:  Mood & affect appropriate.     Data Reviewed:   CBC: Recent Labs  Lab 11/23/23 2317 11/24/23 0430  WBC 6.8 6.6  NEUTROABS 3.6  --   HGB 13.8 14.6  HCT 45.9 47.8*  MCV 92.0 91.4  PLT 241 248   Basic Metabolic Panel: Recent Labs  Lab 11/23/23 2317 11/24/23 0430  NA 138 140  K 4.5 3.4*  CL 107 106  CO2 23 25  GLUCOSE 151* 164*  BUN 13 9  CREATININE 0.88 0.81  CALCIUM 9.1 9.1   GFR: Estimated Creatinine Clearance: 107.9 mL/min (by C-G formula based on SCr of 0.81 mg/dL). Liver Function Tests: Recent Labs  Lab 11/24/23 0430  AST 22  ALT 12  ALKPHOS 60  BILITOT 0.4  PROT 7.7  ALBUMIN 3.1*   No results for input(s):  LIPASE, AMYLASE in the last 168 hours. No results for input(s): AMMONIA in the last 168 hours. Coagulation Profile: No results for input(s): INR, PROTIME in the last 168 hours. Cardiac Enzymes: No results for input(s): CKTOTAL, CKMB, CKMBINDEX, TROPONINI in the last 168 hours. BNP (last 3 results) No results for input(s): PROBNP in the last 8760 hours. HbA1C: No results for input(s): HGBA1C in the last 72 hours. CBG: Recent Labs  Lab 11/24/23 0654  GLUCAP 202*   Lipid Profile: No results for input(s): CHOL, HDL, LDLCALC, TRIG, CHOLHDL, LDLDIRECT in the last 72 hours. Thyroid  Function Tests: No results for input(s): TSH, T4TOTAL, FREET4, T3FREE, THYROIDAB in the last 72 hours. Anemia Panel: No results for input(s): VITAMINB12, FOLATE, FERRITIN, TIBC, IRON, RETICCTPCT in the last 72 hours. Urine analysis:    Component Value Date/Time   COLORURINE YELLOW 02/24/2023 0518   APPEARANCEUR CLEAR 02/24/2023 0518   APPEARANCEUR Clear 07/16/2018 1126   LABSPEC 1.030 02/24/2023 0518   PHURINE 5.0 02/24/2023 0518   GLUCOSEU >=500 (A) 02/24/2023 0518   HGBUR NEGATIVE 02/24/2023 0518   BILIRUBINUR NEGATIVE 02/24/2023 0518   BILIRUBINUR Negative 07/16/2018 1126   KETONESUR 5 (A) 02/24/2023 0518   PROTEINUR NEGATIVE 02/24/2023 0518   UROBILINOGEN 0.2 01/04/2009 1218   NITRITE NEGATIVE 02/24/2023 0518   LEUKOCYTESUR NEGATIVE 02/24/2023 0518   Sepsis Labs: @LABRCNTIP (procalcitonin:4,lacticidven:4)  ) Recent Results (from the past 240 hours)  MRSA Next Gen by PCR, Nasal     Status: None   Collection Time: 11/24/23  7:13 AM   Specimen: Nasal Mucosa; Nasal Swab  Result Value Ref Range Status   MRSA by PCR Next Gen NOT DETECTED NOT DETECTED Final    Comment: (NOTE) The GeneXpert MRSA Assay (FDA approved for NASAL specimens only), is one component of a comprehensive MRSA colonization surveillance program. It is not intended to  diagnose MRSA infection nor to guide or monitor treatment for MRSA infections. Test performance is not FDA approved in patients less than 26 years old. Performed at [facility], [address] [place], [place] [address]      Radiology Studies: DG Chest Port 1 View Result Date: 11/23/2023 CLINICAL DATA:  Shortness of breath EXAM: PORTABLE CHEST 1 VIEW COMPARISON:  10/02/2023 FINDINGS: Cardiomegaly with mild central congestion. No focal opacity, pleural effusion, or pneumothorax. Possible small calcified loose bodies at the shoulder. Moderate degenerative changes of the shoulders. IMPRESSION: Cardiomegaly with mild central congestion. Electronically Signed   By: Luke Bun M.D.   On: 11/23/2023 23:17     Scheduled Meds:  apixaban   5 mg Oral BID   cyanocobalamin   500 mcg Oral Daily   empagliflozin   25 mg Oral Daily   furosemide   80 mg Intravenous Q12H   gabapentin   200 mg Oral BID   insulin  aspart  0-6 Units Subcutaneous TID WC   lactulose   20 g Oral Daily   losartan   50 mg Oral Daily   metoprolol  succinate  100 mg Oral Daily   potassium chloride   40 mEq Oral BID   spironolactone   25 mg Oral Daily   Continuous Infusions:  LOS: 0 days    Time spent:    Sigurd Pac, MD [facility]   11/24/2023, 10:28 AM

## 2023-11-25 DIAGNOSIS — I5033 Acute on chronic diastolic (congestive) heart failure: Secondary | ICD-10-CM | POA: Diagnosis not present

## 2023-11-25 LAB — GLUCOSE, CAPILLARY
Glucose-Capillary: 160 mg/dL — ABNORMAL HIGH (ref 70–99)
Glucose-Capillary: 176 mg/dL — ABNORMAL HIGH (ref 70–99)
Glucose-Capillary: 195 mg/dL — ABNORMAL HIGH (ref 70–99)
Glucose-Capillary: 151 mg/dL — ABNORMAL HIGH (ref 70–99)

## 2023-11-25 LAB — BASIC METABOLIC PANEL WITH GFR
Anion gap: 8 (ref 5–15)
BUN: 11 mg/dL (ref 8–23)
CO2: 30 mmol/L (ref 22–32)
Calcium: 8.7 mg/dL — ABNORMAL LOW (ref 8.9–10.3)
Chloride: 101 mmol/L (ref 98–111)
Creatinine, Ser: 0.89 mg/dL (ref 0.44–1.00)
GFR, Estimated: >60 mL/min (ref >60)
Glucose, Bld: 161 mg/dL — ABNORMAL HIGH (ref 70–99)
Potassium: 3.3 mmol/L — ABNORMAL LOW (ref 3.5–5.1)
Sodium: 139 mmol/L (ref 135–145)

## 2023-11-25 MED ORDER — DILTIAZEM HCL 30 MG PO TABS
30.0000 mg | ORAL_TABLET | Freq: Four times a day (QID) | ORAL | Status: DC
  Administered 2023-11-26 – 2023-11-29 (×11): 30 mg via ORAL
  Filled 2023-11-25 (×12): qty 1

## 2023-11-25 MED ORDER — POTASSIUM CHLORIDE CRYS ER 20 MEQ PO TBCR
40.0000 meq | EXTENDED_RELEASE_TABLET | ORAL | Status: AC
  Administered 2023-11-25 (×2): 40 meq via ORAL
  Filled 2023-11-25 (×2): qty 2

## 2023-11-25 MED ORDER — FUROSEMIDE 10 MG/ML IJ SOLN
80.0000 mg | Freq: Three times a day (TID) | INTRAMUSCULAR | Status: DC
  Administered 2023-11-25 – 2023-11-28 (×9): 80 mg via INTRAVENOUS
  Filled 2023-11-25 (×11): qty 8

## 2023-11-25 NOTE — Plan of Care (Signed)
   Problem: Fluid Volume: Goal: Ability to maintain a balanced intake and output will improve Outcome: Progressing   Problem: Health Behavior/Discharge Planning: Goal: Ability to manage health-related needs will improve Outcome: Progressing

## 2023-11-25 NOTE — TOC CM/SW Note (Signed)
 Transition of Care [facility]) - Inpatient Brief Assessment   Patient Details  Name: Tracie [name] MRN: [id number] Date of Birth: 1961-01-05  Transition of Care [facility]) CM/SW Contact:    Lauraine FORBES Saa, LCSWA Phone Number: 11/25/2023, 2:15 PM   Clinical Narrative:  2:15 PM Per chart review, patient resides at home with spouse. Patient has a PCP and insurance. Patient does not have SNF history. Patient has HH history with [facility] and DME (bariatric rolling walker) history with [facility]. Patient's preferred pharmacy's are [facility] and [facility] [phone] [place]. TOC consult was placed for [facility] screen and SNF placement. TOC will continue to follow and be available to assist.  Transition of Care Asessment: Insurance and Status: Insurance coverage has been reviewed Patient has primary care physician: Yes Home environment has been reviewed: Private Residence Prior level of function:: N/A Prior/Current Home Services: No current home services Social Drivers of Health Review: SDOH reviewed no interventions necessary Readmission risk has been reviewed: Yes (Currently Yellow 19%) Transition of care needs: transition of care needs identified, TOC will continue to follow

## 2023-11-25 NOTE — Progress Notes (Signed)
 MD notified of low bp 86/59 Map 68. Held Coreg until further instructions. Will recheck bp

## 2023-11-25 NOTE — Progress Notes (Signed)
 PROGRESS NOTE    Tracie [name]  FMW:[id number] DOB: [birth year] DOA: 11/23/2023 PCP: Rolinda Millman, MD  Morbidly obese 62/F with type 2 diabetes, hypertension, history of DVT, paroxysmal A-fib on Eliquis , chronic diastolic CHF, chronic back pain recently hospitalized 6/26>7/10, for severe volume overload, complicated by worsening back pain, lumbar compression fractures etc. she was discharged home on torsemide . - Reports progressive edema 3 weeks after discharge, reports initial compliance with torsemide , then developed a rash/skin infection in her lower legs, called her PCP who advised her to hold torsemide , subsequently had worsening swelling while off diuretics for 1 week back in the emergency room 8/16. - In the ER hypertensive, rate controlled A-fib, chest x-ray with cardiomegaly pulmonary vascular congestion, creatinine 0.8, BNP 290   Subjective: - Feels better overall, breathing is improving, swelling starting to improve as well  Assessment and Plan:  Acute on chronic diastolic heart failure: Recent echo EF 60-65%.,  Indeterminate diastolic function, normal RV - significantly volume overloaded, stopped diuretics a week ago after skin infection  - 5.7 L negative, continue IV Lasix  increased dose to 80 mg 3 times daily - Continue Lopressor , Aldactone , losartan  - Appears to be a poor candidate for Jardiance , will discontinue  Permanent atrial fibrillation -Continue Lopressor , Eliquis , heart rate is controlled   Hypokalemia Replaced.   Chronic back pain, h/o L1 compression fracture- H/o kyphoplasty 02/2023 Chronic osteoporosis as well as arthritis. - Supportive care   Morbid obesity Body mass index is 46.06 kg/m.  Complicating further her current condition. Diet, exercise and weight reduction advised. Concern for obesity hypoventilation syndrome is also high.   -She needs a sleep study   Type 2 diabetes mellitus.  Well-controlled without long-term insulin  use without  complication. Hemoglobin A1c 6.2.  Currently on glipizide  2.5 mg daily.  -Continue sliding scale  DVT prophylaxis: Eliquis  Code Status: Full code Family Communication: None present Disposition Plan: Home pending improvement in volume status  Consultants:    Procedures:   Antimicrobials:    Objective: Vitals:   11/24/23 2318 11/25/23 0408 11/25/23 0610 11/25/23 0814  BP: (!) 121/92 (!) 113/90  95/63  Pulse:  77  (!) 107  Resp: 16 15  18   Temp: 97.8 F (36.6 C) 97.7 F (36.5 C)  97.9 F (36.6 C)  TempSrc: Tympanic Oral  Oral  SpO2: 92% 92%    Weight:   (!) 142.3 kg   Height:        Intake/Output Summary (Last 24 hours) at 11/25/2023 0956 Last data filed at 11/25/2023 0947 Gross per 24 hour  Intake 360 ml  Output 2950 ml  Net -2590 ml   Filed Weights   11/23/23 2240 11/24/23 0647 11/25/23 0610  Weight: (!) 143.3 kg (!) 144 kg (!) 142.3 kg    Examination:  General exam: Appears calm and comfortable morbidly obese, AAO x 3 Respiratory system: Improving air movement, decreased at the bases Cardiovascular system: S1 & S2 heard, irregular rhythm Abd: nondistended, soft and nontender.Normal bowel sounds heard. Central nervous system: Alert and oriented. No focal neurological deficits. Extremities: 2+ edema, mild maculopapular lesions in both lower legs Skin: As above Psychiatry:  Mood & affect appropriate.     Data Reviewed:   CBC: Recent Labs  Lab 11/23/23 2317 11/24/23 0430  WBC 6.8 6.6  NEUTROABS 3.6  --   HGB 13.8 14.6  HCT 45.9 47.8*  MCV 92.0 91.4  PLT 241 248   Basic Metabolic Panel: Recent Labs  Lab 11/23/23 2317 11/24/23 0430 11/25/23  0300  NA 138 140 139  K 4.5 3.4* 3.3*  CL 107 106 101  CO2 23 25 30   GLUCOSE 151* 164* 161*  BUN 13 9 11   CREATININE 0.88 0.81 0.89  CALCIUM 9.1 9.1 8.7*   GFR: Estimated Creatinine Clearance: 97.5 mL/min (by C-G formula based on SCr of 0.89 mg/dL). Liver Function Tests: Recent Labs  Lab  11/24/23 0430  AST 22  ALT 12  ALKPHOS 60  BILITOT 0.4  PROT 7.7  ALBUMIN 3.1*   No results for input(s): LIPASE, AMYLASE in the last 168 hours. No results for input(s): AMMONIA in the last 168 hours. Coagulation Profile: No results for input(s): INR, PROTIME in the last 168 hours. Cardiac Enzymes: No results for input(s): CKTOTAL, CKMB, CKMBINDEX, TROPONINI in the last 168 hours. BNP (last 3 results) No results for input(s): PROBNP in the last 8760 hours. HbA1C: No results for input(s): HGBA1C in the last 72 hours. CBG: Recent Labs  Lab 11/24/23 0654 11/24/23 1105 11/24/23 1610 11/24/23 2128 11/25/23 0624  GLUCAP 202* 173* 163* 163* 160*   Lipid Profile: No results for input(s): CHOL, HDL, LDLCALC, TRIG, CHOLHDL, LDLDIRECT in the last 72 hours. Thyroid  Function Tests: No results for input(s): TSH, T4TOTAL, FREET4, T3FREE, THYROIDAB in the last 72 hours. Anemia Panel: No results for input(s): VITAMINB12, FOLATE, FERRITIN, TIBC, IRON, RETICCTPCT in the last 72 hours. Urine analysis:    Component Value Date/Time   COLORURINE YELLOW 02/24/2023 0518   APPEARANCEUR CLEAR 02/24/2023 0518   APPEARANCEUR Clear 07/16/2018 1126   LABSPEC 1.030 02/24/2023 0518   PHURINE 5.0 02/24/2023 0518   GLUCOSEU >=500 (A) 02/24/2023 0518   HGBUR NEGATIVE 02/24/2023 0518   BILIRUBINUR NEGATIVE 02/24/2023 0518   BILIRUBINUR Negative 07/16/2018 1126   KETONESUR 5 (A) 02/24/2023 0518   PROTEINUR NEGATIVE 02/24/2023 0518   UROBILINOGEN 0.2 01/04/2009 1218   NITRITE NEGATIVE 02/24/2023 0518   LEUKOCYTESUR NEGATIVE 02/24/2023 0518   Sepsis Labs: @LABRCNTIP (procalcitonin:4,lacticidven:4)  ) Recent Results (from the past 240 hours)  MRSA Next Gen by PCR, Nasal     Status: None   Collection Time: 11/24/23  7:13 AM   Specimen: Nasal Mucosa; Nasal Swab  Result Value Ref Range Status   MRSA by PCR Next Gen NOT DETECTED NOT DETECTED  Final    Comment: (NOTE) The GeneXpert MRSA Assay (FDA approved for NASAL specimens only), is one component of a comprehensive MRSA colonization surveillance program. It is not intended to diagnose MRSA infection nor to guide or monitor treatment for MRSA infections. Test performance is not FDA approved in patients less than 5 years old. Performed at [facility], [address] [place], [place] [address]      Radiology Studies: DG Chest Port 1 View Result Date: 11/23/2023 CLINICAL DATA:  Shortness of breath EXAM: PORTABLE CHEST 1 VIEW COMPARISON:  10/02/2023 FINDINGS: Cardiomegaly with mild central congestion. No focal opacity, pleural effusion, or pneumothorax. Possible small calcified loose bodies at the shoulder. Moderate degenerative changes of the shoulders. IMPRESSION: Cardiomegaly with mild central congestion. Electronically Signed   By: Luke Bun M.D.   On: 11/23/2023 23:17     Scheduled Meds:  apixaban   5 mg Oral BID   cyanocobalamin   500 mcg Oral Daily   empagliflozin   25 mg Oral Daily   furosemide   80 mg Intravenous Q8H   gabapentin   200 mg Oral BID   insulin  aspart  0-6 Units Subcutaneous TID WC   lactulose   20 g Oral Daily  losartan   50 mg Oral Daily   metoprolol  succinate  100 mg Oral Daily   potassium chloride   40 mEq Oral Q4H   spironolactone   25 mg Oral Daily   Continuous Infusions:   LOS: 1 day    Time spent:    Sigurd Pac, MD [facility]   11/25/2023, 9:56 AM

## 2023-11-26 ENCOUNTER — Encounter (HOSPITAL_COMMUNITY): Payer: Self-pay | Admitting: Internal Medicine

## 2023-11-26 DIAGNOSIS — I5033 Acute on chronic diastolic (congestive) heart failure: Secondary | ICD-10-CM | POA: Diagnosis not present

## 2023-11-26 LAB — BASIC METABOLIC PANEL WITH GFR
Anion gap: 9 (ref 5–15)
BUN: 13 mg/dL (ref 8–23)
CO2: 31 mmol/L (ref 22–32)
Calcium: 8.9 mg/dL (ref 8.9–10.3)
Chloride: 97 mmol/L — ABNORMAL LOW (ref 98–111)
Creatinine, Ser: 1.07 mg/dL — ABNORMAL HIGH (ref 0.44–1.00)
GFR, Estimated: 58 mL/min — ABNORMAL LOW (ref >60)
Glucose, Bld: 150 mg/dL — ABNORMAL HIGH (ref 70–99)
Potassium: 3.3 mmol/L — ABNORMAL LOW (ref 3.5–5.1)
Sodium: 137 mmol/L (ref 135–145)

## 2023-11-26 LAB — GLUCOSE, CAPILLARY
Glucose-Capillary: 168 mg/dL — ABNORMAL HIGH (ref 70–99)
Glucose-Capillary: 172 mg/dL — ABNORMAL HIGH (ref 70–99)
Glucose-Capillary: 216 mg/dL — ABNORMAL HIGH (ref 70–99)
Glucose-Capillary: 168 mg/dL — ABNORMAL HIGH (ref 70–99)

## 2023-11-26 NOTE — Progress Notes (Addendum)
 PROGRESS NOTE    Tracie [name]  FMW:[id number] DOB: 06-14-1960 DOA: 11/23/2023 PCP: Rolinda Millman, MD  Morbidly obese 62/F with type 2 diabetes, hypertension, history of DVT, paroxysmal A-fib on Eliquis , chronic diastolic CHF, chronic back pain recently hospitalized 6/26>7/10, for severe volume overload, complicated by back pain, lumbar compression fractures etc. she was discharged home on torsemide . - Reports progressive edema 3 weeks after discharge, reports initial compliance with torsemide , then developed a rash/skin infection in her lower legs, called her PCP who advised her to hold torsemide , subsequently had worsening swelling while off diuretics for 1 week back in the emergency room 8/16. - In the ER hypertensive, rate controlled A-fib, chest x-ray with cardiomegaly pulmonary vascular congestion, creatinine 0.8, BNP 290 - Admitted, restarted on diuretics  Subjective: - Feels better overall, swelling now improving, denies dyspnea today, was able to take a few steps to the chair  Assessment and Plan:  Acute on chronic diastolic heart failure: Recent echo EF 60-65%.,  Indeterminate diastolic function, normal RV - Remains volume overloaded, I suspect this is predominantly RV, likely echo was poor quality with morbid obesity  -she stopped diuretics a week ago after skin infection  - 8.9 L negative, continue IV Lasix  1 more day - Continue Lopressor , Aldactone , losartan  - Appears to be a poor candidate for Jardiance , discontinued - Monitor urine output, BMP in a.m., - Increase activity, PT eval today  Permanent atrial fibrillation -Continue Lopressor , Eliquis , heart rate is controlled   Hypokalemia Replaced.   Chronic back pain, h/o L1 compression fracture- H/o kyphoplasty 02/2023 Chronic osteoporosis as well as arthritis. - Supportive care   Morbid obesity Body mass index is 46.06 kg/m.  Complicating further her current condition. Diet, exercise and weight reduction  advised. Concern for obesity hypoventilation syndrome is also high.   -She needs a sleep study after DC   Type 2 diabetes mellitus.  Well-controlled without long-term insulin  use without complication. Hemoglobin A1c 6.2.  Currently on glipizide  2.5 mg daily.  -Continue sliding scale  DVT prophylaxis: Eliquis  Code Status: Full code Family Communication: None present Disposition Plan: Home pending improvement in volume status  Consultants:    Procedures:   Antimicrobials:    Objective: Vitals:   11/26/23 0830 11/26/23 0900 11/26/23 1030 11/26/23 1117  BP:    108/69  Pulse: 86 (!) 110 93 85  Resp: 15 18 (!) 22 11  Temp:    97.7 F (36.5 C)  TempSrc:    Oral  SpO2: 94% 93% 93% 91%  Weight:      Height:        Intake/Output Summary (Last 24 hours) at 11/26/2023 1120 Last data filed at 11/26/2023 0341 Gross per 24 hour  Intake 237 ml  Output 2200 ml  Net -1963 ml   Filed Weights   11/24/23 0647 11/25/23 0610 11/26/23 0343  Weight: (!) 144 kg (!) 142.3 kg (!) 140.7 kg    Examination:  General exam: Appears calm and comfortable morbidly obese, AAO x 3 HEENT: Neck obese unable to assess JVD Respiratory system: Improving air movement, decreased at the bases Cardiovascular system: S1 & S2 heard, irregular rhythm Abd: nondistended, soft and nontender.Normal bowel sounds heard. Central nervous system: Alert and oriented. No focal neurological deficits. Extremities: 1-2+ edema, mild maculopapular lesions in both lower legs Skin: As above Psychiatry:  Mood & affect appropriate.     Data Reviewed:   CBC: Recent Labs  Lab 11/23/23 2317 11/24/23 0430  WBC 6.8 6.6  NEUTROABS 3.6  --  HGB 13.8 14.6  HCT 45.9 47.8*  MCV 92.0 91.4  PLT 241 248   Basic Metabolic Panel: Recent Labs  Lab 11/23/23 2317 11/24/23 0430 11/25/23 0300 11/26/23 0215  NA 138 140 139 137  K 4.5 3.4* 3.3* 3.3*  CL 107 106 101 97*  CO2 23 25 30 31   GLUCOSE 151* 164* 161* 150*  BUN 13  9 11 13   CREATININE 0.88 0.81 0.89 1.07*  CALCIUM 9.1 9.1 8.7* 8.9   GFR: Estimated Creatinine Clearance: 80.6 mL/min (A) (by C-G formula based on SCr of 1.07 mg/dL (H)). Liver Function Tests: Recent Labs  Lab 11/24/23 0430  AST 22  ALT 12  ALKPHOS 60  BILITOT 0.4  PROT 7.7  ALBUMIN 3.1*   No results for input(s): LIPASE, AMYLASE in the last 168 hours. No results for input(s): AMMONIA in the last 168 hours. Coagulation Profile: No results for input(s): INR, PROTIME in the last 168 hours. Cardiac Enzymes: No results for input(s): CKTOTAL, CKMB, CKMBINDEX, TROPONINI in the last 168 hours. BNP (last 3 results) No results for input(s): PROBNP in the last 8760 hours. HbA1C: No results for input(s): HGBA1C in the last 72 hours. CBG: Recent Labs  Lab 11/25/23 1133 11/25/23 1656 11/25/23 2127 11/26/23 0602 11/26/23 1116  GLUCAP 176* 195* 151* 168* 172*   Lipid Profile: No results for input(s): CHOL, HDL, LDLCALC, TRIG, CHOLHDL, LDLDIRECT in the last 72 hours. Thyroid  Function Tests: No results for input(s): TSH, T4TOTAL, FREET4, T3FREE, THYROIDAB in the last 72 hours. Anemia Panel: No results for input(s): VITAMINB12, FOLATE, FERRITIN, TIBC, IRON, RETICCTPCT in the last 72 hours. Urine analysis:    Component Value Date/Time   COLORURINE YELLOW 02/24/2023 0518   APPEARANCEUR CLEAR 02/24/2023 0518   APPEARANCEUR Clear 07/16/2018 1126   LABSPEC 1.030 02/24/2023 0518   PHURINE 5.0 02/24/2023 0518   GLUCOSEU >=500 (A) 02/24/2023 0518   HGBUR NEGATIVE 02/24/2023 0518   BILIRUBINUR NEGATIVE 02/24/2023 0518   BILIRUBINUR Negative 07/16/2018 1126   KETONESUR 5 (A) 02/24/2023 0518   PROTEINUR NEGATIVE 02/24/2023 0518   UROBILINOGEN 0.2 01/04/2009 1218   NITRITE NEGATIVE 02/24/2023 0518   LEUKOCYTESUR NEGATIVE 02/24/2023 0518   Sepsis Labs: @LABRCNTIP (procalcitonin:4,lacticidven:4)  ) Recent Results (from the past  240 hours)  MRSA Next Gen by PCR, Nasal     Status: None   Collection Time: 11/24/23  7:13 AM   Specimen: Nasal Mucosa; Nasal Swab  Result Value Ref Range Status   MRSA by PCR Next Gen NOT DETECTED NOT DETECTED Final    Comment: (NOTE) The GeneXpert MRSA Assay (FDA approved for NASAL specimens only), is one component of a comprehensive MRSA colonization surveillance program. It is not intended to diagnose MRSA infection nor to guide or monitor treatment for MRSA infections. Test performance is not FDA approved in patients less than 24 years old. Performed at [facility], [address] [place], [place] [address]      Radiology Studies: No results found.    Scheduled Meds:  apixaban   5 mg Oral BID   cyanocobalamin   500 mcg Oral Daily   diltiazem   30 mg Oral Q6H   furosemide   80 mg Intravenous Q8H   gabapentin   200 mg Oral BID   insulin  aspart  0-6 Units Subcutaneous TID WC   lactulose   20 g Oral Daily   losartan   50 mg Oral Daily   metoprolol  succinate  100 mg Oral Daily   spironolactone   25 mg Oral Daily  Continuous Infusions:   LOS: 2 days    Time spent:    Sigurd Pac, MD [facility]   11/26/2023, 11:20 AM

## 2023-11-26 NOTE — Progress Notes (Signed)
 Heart Failure Nurse Navigator Progress Note  PCP: Rolinda Millman, MD PCP-Cardiologist: None Admission Diagnosis: Acute on chronic congestive heart failure, Atrial Fibrillation.  Admitted from: Home via EMS  Presentation:   Tracie [name] presented with shortness of breath,dyspnea,  and weeping from Bilateral lower extremities, recently discharged from hospital for CHF(6/25-10/12/23) .  Per her PCP her torsemide  was stopped on 11/13/23. BP 164/90, HR a fib 90-120s. BNP 290.4, BMI 45.34. CXR Cardiomegaly with mild central congestion.   Patient and her husband were both educated on the sign and symptoms of heart failure, daily weights, when to call her doctor or go to the ED. Diet/ fluid restrictions ( patient reported she usually drinks more then 64 oz per day, including water, sweet tea and some pepsi). Continued education on taking all her medications as prescribed and attending all medical appointments. Patient and family verbalized their understanding of education. Per Dr. Fairy, a HF [facility] appointment was scheduled for 12/02/2023 @ 8:15 am.   ECHO/ LVEF: 60-65% Indeterminate diastolic function, normal RV   Clinical Course:  Past Medical History:  Diagnosis Date   Atrial fibrillation (HCC)    Celiac artery dissection (HCC)    Diabetes mellitus (HCC)    History of DVT (deep vein thrombosis) 2010   Hyperlipidemia    Hypertension    Morbid obesity (HCC)      Social History   Socioeconomic History   Marital status: Married    Spouse name: Not on file   Number of children: Not on file   Years of education: Not on file   Highest education level: Not on file  Occupational History   Not on file  Tobacco Use   Smoking status: Former    Current packs/day: 0.00    Average packs/day: 1 pack/day for 20.0 years (20.0 ttl pk-yrs)    Types: Cigarettes    Start date: 71    Quit date: 2010    Years since quitting: 15.6   Smokeless tobacco: Never  Vaping Use   Vaping status: Never  Used  Substance and Sexual Activity   Alcohol use: Never   Drug use: Not Currently   Sexual activity: Yes  Other Topics Concern   Not on file  Social History Narrative   Not on file   Social Drivers of Health   Financial Resource Strain: Not on file  Food Insecurity: No Food Insecurity (11/25/2023)   Hunger Vital Sign    Worried About Running Out of Food in the Last Year: Never true    Ran Out of Food in the Last Year: Never true  Transportation Needs: No Transportation Needs (11/25/2023)   PRAPARE - Administrator, Civil Service (Medical): No    Lack of Transportation (Non-Medical): No  Physical Activity: Not on file  Stress: Not on file  Social Connections: Moderately Isolated (11/25/2023)   Social Connection and Isolation Panel    Frequency of Communication with Friends and Family: More than three times a week    Frequency of Social Gatherings with Friends and Family: Once a week    Attends Religious Services: Never    Database administrator or Organizations: No    Attends Engineer, structural: Never    Marital Status: Married   Water engineer and Provision:  Detailed education and instructions provided on heart failure disease management including the following:  Signs and symptoms of Heart Failure When to call the physician Importance of daily weights Low sodium diet Fluid restriction  Medication management Anticipated future follow-up appointments  Patient education given on each of the above topics.  Patient acknowledges understanding via teach back method and acceptance of all instructions.  Education Materials:  Living Better With Heart Failure Booklet, HF zone tool, & Daily Weight Tracker Tool.  Patient has scale at home: Yes Patient has pill box at home: Yes    High Risk Criteria for Readmission and/or Poor Patient Outcomes: Heart failure hospital admissions (last 6 months): 1  No Show rate: 3 % Difficult social situation: No,  Lives with her husband Sheena Demonstrates medication adherence: Yes, her husband makes up her pill boxes and gives her medications daily.  Primary Language: English  Literacy level: Reading, writing, and comprehension   Barriers of Care:   Diet/ fluid restrictions ( fluid volumes and salty foods)  Daily weights Decrease mobility  Considerations/Referrals:   Referral made to Heart Failure Pharmacist Stewardship: NA Referral made to Heart Failure CSW/NCM TOC: NA Referral made to Heart & Vascular TOC clinic: Yes, per Dr. Fairy, 12/02/2023 @ 8:15 am   Items for Follow-up on DC/TOC: Continued HF education Diet and fluid restrictions, daily weights   Stephane Haddock, BSN, RN Heart Failure Print production planner Chat Only

## 2023-11-26 NOTE — Evaluation (Signed)
 Physical Therapy Evaluation Patient Details Name: Tracie [name] MRN: [id number] DOB: 08-16-1960 Today's Date: 11/26/2023  History of Present Illness  63 y.o. female presents to [facility] hospital on 11/23/2023 with progressive edema despite compliance with torsemide . Pt admitted for CHF exacerbation. PMHx: Kyphoplasty 2024, DVT, A-fib, T2DM, HFpEF.  Clinical Impression  Pt presents to PT with deficits in activity tolerance, functional mobility, strength, power. Ambulation distance is limited by significant tachycardia, up to 203 BPM when ambulating observed on monitor. Pt reports DOE with activity but denies other symptoms, SpO2 stable in mid 90s. Pt will benefit from continued mobilization as vital signs permit. PT recommends discharge home with HHPT when medically stable.        If plan is discharge home, recommend the following: A little help with walking and/or transfers;A little help with bathing/dressing/bathroom;Assistance with cooking/housework;Assist for transportation;Help with stairs or ramp for entrance   Can travel by private vehicle        Equipment Recommendations None recommended by PT  Recommendations for Other Services       Functional Status Assessment Patient has had a recent decline in their functional status and demonstrates the ability to make significant improvements in function in a reasonable and predictable amount of time.     Precautions / Restrictions Precautions Precautions: Fall Recall of Precautions/Restrictions: Intact Precaution/Restrictions Comments: afib Restrictions Weight Bearing Restrictions Per Provider Order: No      Mobility  Bed Mobility Overal bed mobility: Modified Independent             General bed mobility comments: HOB elevated, increased time    Transfers Overall transfer level: Needs assistance Equipment used: Rolling walker (2 wheels) Transfers: Sit to/from Stand Sit to Stand: Contact guard assist                 Ambulation/Gait Ambulation/Gait assistance: Contact guard assist Gait Distance (Feet): 50 Feet Assistive device: Rolling walker (2 wheels) Gait Pattern/deviations: Step-to pattern, Trunk flexed Gait velocity: reduced Gait velocity interpretation: <1.8 ft/sec, indicate of risk for recurrent falls   General Gait Details: pt with slowed step-to gait, increased trunk flexion over RW. distance limited by tachycardia  Stairs            Wheelchair Mobility     Tilt Bed    Modified Rankin (Stroke Patients Only)       Balance Overall balance assessment: Needs assistance Sitting-balance support: No upper extremity supported, Feet supported Sitting balance-Leahy Scale: Good     Standing balance support: Single extremity supported, Reliant on assistive device for balance Standing balance-Leahy Scale: Poor                               Pertinent Vitals/Pain Pain Assessment Pain Assessment: No/denies pain    Home Living Family/patient expects to be discharged to:: Private residence Living Arrangements: Spouse/significant other Available Help at Discharge: Family;Available 24 hours/day Type of Home: Apartment Home Access: Level entry       Home Layout: One level Home Equipment: Agricultural consultant (2 wheels);Rollator (4 wheels);Cane - quad;Toilet riser;Shower seat;Adaptive equipment      Prior Function Prior Level of Function : Needs assist             Mobility Comments: ambulatory with rollator, sleeps in chair at baseline ADLs Comments: Sponge bathes, seated grooming at baseline assist with LB dressing since Kyphoplasty in Nov     Extremity/Trunk Assessment   Upper Extremity Assessment Upper  Extremity Assessment: Overall WFL for tasks assessed    Lower Extremity Assessment Lower Extremity Assessment: Generalized weakness    Cervical / Trunk Assessment Cervical / Trunk Assessment: Other exceptions Cervical / Trunk Exceptions: body habitus   Communication   Communication Communication: No apparent difficulties    Cognition Arousal: Alert Behavior During Therapy: WFL for tasks assessed/performed   PT - Cognitive impairments: No apparent impairments                         Following commands: Intact       Cueing Cueing Techniques: Verbal cues     General Comments General comments (skin integrity, edema, etc.): pt with significant tachycardia with activity, in 120s at rest and increases to 203 with short bout of ambulation. Once seated and resting pt recovers to 130s within 2-3 minutes. RN aware    Exercises     Assessment/Plan    PT Assessment Patient needs continued PT services  PT Problem List Decreased strength;Decreased activity tolerance;Decreased balance;Decreased mobility;Cardiopulmonary status limiting activity       PT Treatment Interventions DME instruction;Gait training;Functional mobility training;Therapeutic activities;Therapeutic exercise;Balance training;Neuromuscular re-education;Patient/family education    PT Goals (Current goals can be found in the Care Plan section)  Acute Rehab PT Goals Patient Stated Goal: to return home PT Goal Formulation: With patient Time For Goal Achievement: 12/10/23 Potential to Achieve Goals: Fair Additional Goals Additional Goal #1: Pt will report 1/4 DOE or less when ambulating for >150' to demonstrate improved activity tolerance    Frequency Min 2X/week     Co-evaluation               AM-PAC PT 6 Clicks Mobility  Outcome Measure Help needed turning from your back to your side while in a flat bed without using bedrails?: None Help needed moving from lying on your back to sitting on the side of a flat bed without using bedrails?: None Help needed moving to and from a bed to a chair (including a wheelchair)?: A Little Help needed standing up from a chair using your arms (e.g., wheelchair or bedside chair)?: A Little Help needed to walk in  hospital room?: A Little Help needed climbing 3-5 steps with a railing? : A Lot 6 Click Score: 19    End of Session Equipment Utilized During Treatment: Gait belt Activity Tolerance: Treatment limited secondary to medical complications (Comment) Patient left: in chair;Other (comment) (on [facility], RN aware) Nurse Communication: Mobility status PT Visit Diagnosis: Other abnormalities of gait and mobility (R26.89);Muscle weakness (generalized) (M62.81)    Time: 8768-8751 PT Time Calculation (min) (ACUTE ONLY): 17 min   Charges:   PT Evaluation $PT Eval Low Complexity: 1 Low   PT General Charges $$ ACUTE PT VISIT: 1 Visit         Bernardino JINNY Ruth, PT, DPT Acute Rehabilitation Office [phone]   Bernardino JINNY Ruth 11/26/2023, 1:04 PM

## 2023-11-26 NOTE — Plan of Care (Signed)

## 2023-11-26 NOTE — Progress Notes (Signed)
 REDS Clip  READING (normal 20-35%) = 31%   CHEST RULER (in) =30 Clip Station =  C  Did Reds Clip Reading x 2 on patient, both readings were 31 %.Patient tolerated well.  Dr. Fairy notified of results.   Stephane Haddock, BSN, Scientist, clinical (histocompatibility and immunogenetics) Only

## 2023-11-26 NOTE — TOC Initial Note (Signed)
 Transition of Care (TOC) - Initial/Assessment Note   Spoke to patient and husband at bedside.   Confirmed face sheet information.   Discussed recommendations for HHRN and HHPT. Patient and husband in agreement. No preference. Darleene with [facility] accepted referral.   Asked MD for orders and face to face   Patient has rollator , walker and bedside commode at home already  Patient Details  Name: Tracie [name] MRN: [id number] Date of Birth: Apr 28, 1960  Transition of Care [facility]) CM/SW Contact:    Stephane Powell Jansky, RN Phone Number: 11/26/2023, 2:16 PM  Clinical Narrative:                   Expected Discharge Plan: Home w Home Health Services Barriers to Discharge: Continued Medical Work up   Patient Goals and CMS Choice Patient states their goals for this hospitalization and ongoing recovery are:: to return to home CMS Medicare.gov Compare Post Acute Care list provided to:: Patient Choice offered to / list presented to : Patient, Spouse      Expected Discharge Plan and Services   Discharge Planning Services: CM Consult Post Acute Care Choice: Home Health Living arrangements for the past 2 months: Single Family Home                 DME Arranged: N/A DME Agency: NA       HH Arranged: RN, PT HH Agency: [facility] Date [facility] Agency Contacted: 11/26/23 Time HH Agency Contacted: 1415 Representative spoke with at [facility] Agency: [facility]  Prior Living Arrangements/Services Living arrangements for the past 2 months: Single Family Home Lives with:: Spouse Patient language and need for interpreter reviewed:: Yes Do you feel safe going back to the place where you live?: Yes      Need for Family Participation in Patient Care: Yes (Comment) Care giver support system in place?: Yes (comment) Current home services: DME Criminal Activity/Legal Involvement Pertinent to Current Situation/Hospitalization: No - Comment as needed  Activities of Daily Living   ADL Screening  (condition at time of admission) Independently performs ADLs?: No Does the patient have a NEW difficulty with bathing/dressing/toileting/self-feeding that is expected to last >3 days?: No Does the patient have a NEW difficulty with getting in/out of bed, walking, or climbing stairs that is expected to last >3 days?: No Does the patient have a NEW difficulty with communication that is expected to last >3 days?: No Is the patient deaf or have difficulty hearing?: No Does the patient have difficulty seeing, even when wearing glasses/contacts?: No Does the patient have difficulty concentrating, remembering, or making decisions?: No  Permission Sought/Granted   Permission granted to share information with : Yes, Verbal Permission Granted  Share Information with NAME: husband Randal  Permission granted to share info w AGENCY: [facility]        Emotional Assessment Appearance:: Appears stated age Attitude/Demeanor/Rapport: Engaged Affect (typically observed): Appropriate Orientation: : Oriented to Self, Oriented to Place, Oriented to  Time, Oriented to Situation Alcohol / Substance Use: Not Applicable Psych Involvement: No (comment)  Admission diagnosis:  CHF (congestive heart failure) (HCC) [I50.9] Atrial fibrillation, unspecified type (HCC) [I48.91] Acute on chronic congestive heart failure, unspecified heart failure type (HCC) [I50.9] Patient Active Problem List   Diagnosis Date Noted   CHF (congestive heart failure) (HCC) 11/24/2023   Obesity, class 3 10/16/2023   Chronic a-fib (HCC) 10/09/2023   Permanent atrial fibrillation (HCC) 10/08/2023   Acute on chronic heart failure with preserved ejection fraction (HFpEF) (  HCC) 10/03/2023   SOB (shortness of breath) 10/03/2023   Persistent atrial fibrillation (HCC) 10/03/2023   Lumbar burst fracture (HCC) 10/03/2023   Acute on chronic diastolic CHF (congestive heart failure) (HCC) 10/03/2023   Atrial fibrillation with rapid ventricular  response (HCC) 02/24/2023   Type 2 diabetes mellitus (HCC) 02/24/2023   Lumbar pain 02/24/2023   Leukocytosis 02/24/2023   Hypokalemia 02/24/2023   Myalgia 10/27/2018   Arthralgia of knee, right 10/27/2018   Varicose veins of both lower extremities with pain 07/16/2018   Hyperlipidemia 06/03/2018   Essential hypertension, benign 06/03/2018   PCP:  Rolinda Millman, MD Pharmacy:   [facility] [phone] - [place], [place] [address] [address] - [address] AT [facility] OF [address] [place] [address] Phone: [phone] Fax: [phone]  [facility] [address] [place] [address] Phone: [phone] Fax: [phone]     Social Drivers of Health (SDOH) Social History: SDOH Screenings   Food Insecurity: No Food Insecurity (11/25/2023)  Housing: Unknown (11/26/2023)  Transportation Needs: No Transportation Needs (11/26/2023)  Utilities: Not At Risk (11/25/2023)  Alcohol Screen: Low Risk  (11/26/2023)  Depression (PHQ2-9): Low Risk  (02/26/2020)  Financial Resource Strain: Low Risk  (11/26/2023)  Social Connections: Moderately Isolated (11/25/2023)  Tobacco Use: Medium Risk (11/23/2023)   SDOH Interventions: Housing Interventions: Intervention Not Indicated Transportation Interventions: Intervention Not Indicated Alcohol Usage Interventions: Intervention Not Indicated (Score <7) Financial Strain Interventions: Intervention Not Indicated   Readmission Risk Interventions    10/08/2023    2:56 PM  Readmission Risk Prevention Plan  Post Dischage Appt Complete  Medication Screening Complete  Transportation Screening Complete

## 2023-11-26 NOTE — Progress Notes (Signed)
   11/26/23 0947  Spiritual Encounters  Type of Visit Initial  Care provided to: Patient  Reason for visit Advance directives  OnCall Visit No  Spiritual Framework  Presenting Themes Significant life change  Patient Stress Factors Health changes  Family Stress Factors None identified  Interventions  Spiritual Care Interventions Made Compassionate presence;Established relationship of care and support  Intervention Outcomes  Outcomes Awareness of health;Awareness of support   Chaplain stopped by to review the Advance Directive (AD). Pt's spouse stated she could go over the document with her husband and cont chaplain services when they are ready to complete the AD.   Chaplain services remain available for support.

## 2023-11-27 DIAGNOSIS — E66813 Obesity, class 3: Secondary | ICD-10-CM

## 2023-11-27 DIAGNOSIS — E1169 Type 2 diabetes mellitus with other specified complication: Secondary | ICD-10-CM | POA: Diagnosis not present

## 2023-11-27 DIAGNOSIS — I4819 Other persistent atrial fibrillation: Secondary | ICD-10-CM | POA: Diagnosis not present

## 2023-11-27 DIAGNOSIS — I5033 Acute on chronic diastolic (congestive) heart failure: Secondary | ICD-10-CM | POA: Diagnosis not present

## 2023-11-27 DIAGNOSIS — I10 Essential (primary) hypertension: Secondary | ICD-10-CM | POA: Diagnosis not present

## 2023-11-27 DIAGNOSIS — S32001A Stable burst fracture of unspecified lumbar vertebra, initial encounter for closed fracture: Secondary | ICD-10-CM

## 2023-11-27 LAB — GLUCOSE, CAPILLARY
Glucose-Capillary: 174 mg/dL — ABNORMAL HIGH (ref 70–99)
Glucose-Capillary: 201 mg/dL — ABNORMAL HIGH (ref 70–99)
Glucose-Capillary: 199 mg/dL — ABNORMAL HIGH (ref 70–99)
Glucose-Capillary: 167 mg/dL — ABNORMAL HIGH (ref 70–99)

## 2023-11-27 LAB — BASIC METABOLIC PANEL WITH GFR
Anion gap: 13 (ref 5–15)
BUN: 16 mg/dL (ref 8–23)
CO2: 30 mmol/L (ref 22–32)
Calcium: 8.8 mg/dL — ABNORMAL LOW (ref 8.9–10.3)
Chloride: 95 mmol/L — ABNORMAL LOW (ref 98–111)
Creatinine, Ser: 1.03 mg/dL — ABNORMAL HIGH (ref 0.44–1.00)
GFR, Estimated: >60 mL/min (ref >60)
Glucose, Bld: 168 mg/dL — ABNORMAL HIGH (ref 70–99)
Potassium: 2.7 mmol/L — CL (ref 3.5–5.1)
Sodium: 138 mmol/L (ref 135–145)

## 2023-11-27 LAB — MAGNESIUM: Magnesium: 1.5 mg/dL — ABNORMAL LOW (ref 1.7–2.4)

## 2023-11-27 MED ORDER — POTASSIUM CHLORIDE CRYS ER 20 MEQ PO TBCR
40.0000 meq | EXTENDED_RELEASE_TABLET | Freq: Once | ORAL | Status: AC
  Administered 2023-11-27: 40 meq via ORAL
  Filled 2023-11-27: qty 2

## 2023-11-27 MED ORDER — POTASSIUM CHLORIDE 10 MEQ/100ML IV SOLN
10.0000 meq | INTRAVENOUS | Status: AC
  Administered 2023-11-27: 10 meq via INTRAVENOUS
  Filled 2023-11-27: qty 100

## 2023-11-27 MED ORDER — OXYCODONE HCL 5 MG PO TABS
10.0000 mg | ORAL_TABLET | ORAL | Status: DC | PRN
  Administered 2023-11-27 – 2023-12-01 (×16): 10 mg via ORAL
  Filled 2023-11-27 (×16): qty 2

## 2023-11-27 MED ORDER — OXYCODONE HCL 5 MG PO TABS: 10.0000 mg | ORAL_TABLET | ORAL | Status: DC | PRN

## 2023-11-27 MED ORDER — POTASSIUM CHLORIDE CRYS ER 20 MEQ PO TBCR
40.0000 meq | EXTENDED_RELEASE_TABLET | ORAL | Status: AC
  Administered 2023-11-27 (×2): 40 meq via ORAL
  Filled 2023-11-27 (×2): qty 2

## 2023-11-27 MED ORDER — SODIUM CHLORIDE 0.9 % IV SOLN: INTRAVENOUS | Status: AC | PRN

## 2023-11-27 MED ORDER — ORAL CARE MOUTH RINSE: 15.0000 mL | OROMUCOSAL | Status: DC | PRN

## 2023-11-27 MED ORDER — MAGNESIUM SULFATE 4 GM/100ML IV SOLN
4.0000 g | Freq: Once | INTRAVENOUS | Status: AC
  Administered 2023-11-27: 4 g via INTRAVENOUS
  Filled 2023-11-27: qty 100

## 2023-11-27 MED ORDER — LACTATED RINGERS IV SOLN: INTRAVENOUS | Status: DC

## 2023-11-27 MED ORDER — OXYCODONE HCL 5 MG PO TABS: 10.0000 mg | ORAL_TABLET | Freq: Four times a day (QID) | ORAL | Status: DC | PRN

## 2023-11-27 NOTE — Hospital Course (Addendum)
 Mrs. Arch was admitted to the hospital with the working diagnosis of heart failure exacerbation.   62/F with type 2 diabetes, hypertension, history of DVT, paroxysmal A-fib, chronic diastolic CHF, chronic back pain who presented with dyspnea. Recent hospitalization 10/02/23 for 10/17/23, for heart failure exacerbation. She was diuresed 19,389 ml and discharged home with instructions to take torsemide  40 mg po daily. She was placed on guideline directed medical therapy for heart failure.  She noticed edema 3 weeks after discharge, reports initial compliance with torsemide , then developed a rash/ in her lower legs, called her PCP who advised her to hold torsemide , subsequently had worsening edema while off diuretics, prompting her to come back to the ED.  On her initial physical examination her blood pressure was  160/62, HR 57, RR 24 and 02 saturation 100%  Lungs with decreased breath sounds at bases, heart with S1 and S2 present irregularly irregular with no gallops, rubs or murmurs, abdomen protuberant but non distended, positive lower extremity edema.   Na 138, K 4.5 Cl 107 bicarbonate 23 glucose 151 bun 13 cr 0,88  BNP 290  High sensitive troponin 5 and 5  Wbc 6.8 hgb 13.8 plt 241   Chest radiograph with right rotation, positive cardiomegaly, with bilateral hilar vascular congestion, no effusions or infiltrates.   EKG 100 bpm, normal axis, normal intervals, qtc 448, atrial flutter with variable block, no significant ST segment or T wave changes.   08/21 responding well to diuresis, continue to have atrial fibrillation with RVR.  08/22 improved rate control atrial fibrillation, starting long acting diltiazem .  08/23 better controlled atrial fibrillation, resumed furosemide  with good toleration.  08/24 patient medical stable for discharge home.

## 2023-11-27 NOTE — Assessment & Plan Note (Addendum)
 Echocardiogram with preserved LV systolic function with EF 60 to 65%, mild LVH, RV systolic function preserved, LA and RA with moderate dilatation, no significant valvular disease.  Urine output is 2,125  ml Systolic blood pressure 110 mmHg range  RAAS inhibition with spironolactone  and losartan  B blockade with metoprolol  succinate 100 mg daily.  Patient on furosemide  60 mg daily

## 2023-11-27 NOTE — Assessment & Plan Note (Addendum)
 Improved rate control to 80 bpm, atrial fibrillation.   Plan to continue metoprolol  succinate and long acting diltiazem  60 mg bid. She had dyspepsia with diltiazem  24 hrs tablet.    Continue anticoagulation with apixaban .

## 2023-11-27 NOTE — Assessment & Plan Note (Addendum)
 Hypomagnesemia hyponatremia   Volume status has improved, renal function today with serum cr at 1,0 with K at 3,7 and serum bicarbonate at 27  Na 132 and Mg 1.7   Add 40 meq Kcl and 2 g mag sulfate.  Follow up renal function and electrolytes.  Avoid hypotension and nephrotoxic medications.

## 2023-11-27 NOTE — Assessment & Plan Note (Signed)
 Calculated BMI is 44.5

## 2023-11-27 NOTE — TOC CM/SW Note (Signed)
    Durable Medical Equipment  (From admission, onward)           Start     Ordered   11/27/23 1602  For home use only DME standard manual wheelchair with seat cushion  Once       Comments: Patient suffers from right hip pain that limits her standing/gait tolerance, Acute on chronic CHF, lumbar burst fracture,  which impairs their ability to perform daily activities like ambulating  in the home.  A cane  will not resolve issue with performing activities of daily living. A wheelchair will allow patient to safely perform daily activities. Patient can safely propel the wheelchair in the home or has a caregiver who can provide assistance. Length of need lifetime . Accessories: removal elevating leg rests (ELRs), wheel locks, extensions and anti-tippers.  Seat and back cushions   Bariatric   Ht 5'10, wt 140.8 kg   11/27/23 1603

## 2023-11-27 NOTE — Progress Notes (Addendum)
 Date and time results received: 11/27/23 0330  Test: potassium Critical Value: 2.7  Name of Provider Notified: Dr. Keturah  Orders Received? Or Actions Taken?: PO and IV potassium  ** patient unable to tolerate IV potassium without equal rate of fluid.  MD approved fluid rate to 67ml/hr.

## 2023-11-27 NOTE — Progress Notes (Signed)
 Physical Therapy Treatment Patient Details Name: Tracie [name] MRN: [id number] DOB: 03/20/1961 Today's Date: 11/27/2023   History of Present Illness 63 y.o. female presents to [facility] hospital on 11/23/2023 with progressive edema despite compliance with torsemide . Pt admitted for CHF exacerbation. PMHx: Kyphoplasty 2024, DVT, A-fib, T2DM, HFpEF.    PT Comments  Pt received in supine, pleasantly cooperative and with good participation and fair tolerance for transfer training. Standing tolerance limited due to severe c/o R hip and anterior thigh pain with weight bearing and significant tachycardia (HR to ~192 bpm with step pivot to chair) so defer longer gait trial out of the room for pt safety. Pt reports mild symptoms of racing HR with standing and SpO2 improved with upright posture, had been 88%-90% on RA in reclined posture and improved to SpO2 93-95% sitting upright in recliner. RN notified of pt pain/HR. Pt continues to benefit from PT services to progress toward functional mobility goals, DME updated below per discussion with supervising PT Ryan L as pt states her R hip pain is not new and may continue to limit her standing/gait tolerance. A wheelchair may allow her to mobilize more independently at home as pain/activity tolerance allows, may need to check WC dimensions with pt/family to ensure doorways are adequate width for WC clearance.     If plan is discharge home, recommend the following: A little help with walking and/or transfers;A little help with bathing/dressing/bathroom;Assistance with cooking/housework;Assist for transportation;Help with stairs or ramp for entrance   Can travel by private vehicle        Equipment Recommendations  Wheelchair (measurements PT);Wheelchair cushion (measurements PT);Other (comment) (bariatric width; removable elevating leg rests)    Recommendations for Other Services       Precautions / Restrictions Precautions Precautions: Fall Recall of  Precautions/Restrictions: Intact Precaution/Restrictions Comments: afib, chronic R hip pain Restrictions Weight Bearing Restrictions Per Provider Order: No     Mobility  Bed Mobility Overal bed mobility: Modified Independent             General bed mobility comments: HOB elevated, increased time    Transfers Overall transfer level: Needs assistance Equipment used: Rolling walker (2 wheels) Transfers: Sit to/from Stand Sit to Stand: Contact guard assist, From elevated surface           General transfer comment: bed height elevated ~3 to simulate pt home environment    Ambulation/Gait Ambulation/Gait assistance: Min assist Gait Distance (Feet): 7 Feet Assistive device: Rolling walker (2 wheels) Gait Pattern/deviations: Step-to pattern, Trunk flexed, Decreased step length - right, Decreased weight shift to right Gait velocity: reduced     General Gait Details: Pt with slowed step-to gait, increased trunk flexion over RW. distance limited by tachycardia and c/o 10/10 RLE pain.   Stairs             Wheelchair Mobility     Tilt Bed    Modified Rankin (Stroke Patients Only)       Balance Overall balance assessment: Needs assistance Sitting-balance support: No upper extremity supported, Feet supported Sitting balance-Leahy Scale: Good     Standing balance support: Single extremity supported, Reliant on assistive device for balance, Bilateral upper extremity supported, During functional activity Standing balance-Leahy Scale: Poor Standing balance comment: severe RLE/hip pain causing increased reliance on RW                            Communication Communication Communication: No apparent difficulties  Cognition Arousal:  Alert Behavior During Therapy: WFL for tasks assessed/performed   PT - Cognitive impairments: No apparent impairments                         Following commands: Intact      Cueing Cueing Techniques:  Verbal cues  Exercises Other Exercises Other Exercises: discussion on supine ankle pumps hourly x10-15 reps and pursed-lip breathing vs IS use to work on improved pulmonary clearance and endurance for increased activity    General Comments General comments (skin integrity, edema, etc.): see BP/HR in comments above; extreme tachy with short distance gait bed to chair, defer additional standing/gait due to HR and severe R hip pain      Pertinent Vitals/Pain Pain Assessment Pain Assessment: 0-10 Pain Score: 10-Worst pain ever Pain Location: R hip and thigh Pain Descriptors / Indicators: Aching, Constant, Discomfort, Grimacing, Guarding, Sharp, Stabbing Pain Intervention(s): Limited activity within patient's tolerance, Monitored during session, Repositioned, Patient requesting pain meds-RN notified (pt defers ice pack)    Home Living                          Prior Function            PT Goals (current goals can now be found in the care plan section) Acute Rehab PT Goals Patient Stated Goal: to return home and for my R hip to hurt less PT Goal Formulation: With patient Time For Goal Achievement: 12/10/23 Progress towards PT goals: Progressing toward goals    Frequency    Min 2X/week      PT Plan      Co-evaluation              AM-PAC PT 6 Clicks Mobility   Outcome Measure  Help needed turning from your back to your side while in a flat bed without using bedrails?: None Help needed moving from lying on your back to sitting on the side of a flat bed without using bedrails?: None Help needed moving to and from a bed to a chair (including a wheelchair)?: A Little Help needed standing up from a chair using your arms (e.g., wheelchair or bedside chair)?: A Little Help needed to walk in hospital room?: Total (unsafe to attempt due to elevated heart rate) Help needed climbing 3-5 steps with a railing? : Total 6 Click Score: 16    End of Session Equipment  Utilized During Treatment: Gait belt Activity Tolerance: Treatment limited secondary to medical complications (Comment);Other (comment);Patient limited by pain (tachycardia) Patient left: in chair;with call bell/phone within reach (no chair alarm place in room, RN notified, pt A&O agreeable to press call bell) Nurse Communication: Mobility status;Patient requests pain meds;Other (comment) (tachycardia) PT Visit Diagnosis: Other abnormalities of gait and mobility (R26.89);Muscle weakness (generalized) (M62.81)     Time: 8963-8894 PT Time Calculation (min) (ACUTE ONLY): 29 min  Charges:    $Gait Training: 8-22 mins $Therapeutic Activity: 8-22 mins PT General Charges $$ ACUTE PT VISIT: 1 Visit                     Mariam Helbert P., PTA Acute Rehabilitation Services Secure Chat Preferred 9a-5:30pm Office: [phone]    Connell HERO [facility] 11/27/2023, 1:06 PM

## 2023-11-27 NOTE — Progress Notes (Signed)
 Mobility Specialist Progress Note;   11/27/23 0850  Mobility  Activity Pivoted/transferred to/from [facility];Pivoted/transferred from bed to chair  Level of Assistance Contact guard assist, steadying assist  Assistive Device Front wheel walker  Distance Ambulated (ft) 5 ft  Activity Response Tolerated fair  Mobility Referral Yes  Mobility visit 1 Mobility  Mobility Specialist Start Time (ACUTE ONLY) 0850  Mobility Specialist Stop Time (ACUTE ONLY) [id number]  Mobility Specialist Time Calculation (min) (ACUTE ONLY) 13 min   Pt on BSC upon arrival, agreeable to transfer to chair. RN deferred ambulation this date d/t elevated HR. Required light MinG assistance for safety. HR up to 175 bpm; pt displayed some dyspnea. HR recovered to 110s once sitting in chair. Pt left with all needs met, call bell in reach.   Lauraine Erm Mobility Specialist Please contact via SecureChat or [employer] [phone]

## 2023-11-27 NOTE — Assessment & Plan Note (Signed)
Continue blood pressure control with losartan and metoprolol.  ?

## 2023-11-27 NOTE — Progress Notes (Addendum)
  Progress Note   Patient: Tracie [name] FMW:[id number] DOB: 1961/03/31 DOA: 11/23/2023     3 DOS: the patient was seen and examined on 11/27/2023   Brief hospital course: Mrs. Sweeny was admitted to the hospital with the working diagnosis of heart failure exacerbation.    62/F with type 2 diabetes, hypertension, history of DVT, paroxysmal A-fib on Eliquis , chronic diastolic CHF, chronic back pain recently hospitalized 6/26>7/10, for severe volume overload, complicated by back pain, lumbar compression fractures etc. she was discharged home on torsemide . - Reports progressive edema 3 weeks after discharge, reports initial compliance with torsemide , then developed a rash/skin infection in her lower legs, called her PCP who advised her to hold torsemide , subsequently had worsening swelling while off diuretics for 1 week back in the emergency room 8/16. - In the ER hypertensive, rate controlled A-fib, chest x-ray with cardiomegaly pulmonary vascular congestion, creatinine 0.8, BNP 290 - Admitted, restarted on diuretics  Assessment and Plan: * Acute on chronic diastolic CHF (congestive heart failure) (HCC) Echocardiogram with preserved LV systolic function with EF 60 to 65%, mild LVH, RV systolic function preserved, LA and RA with moderate dilatation, no significant valvular disease.  Urine output is 2100 ml Systolic blood pressure 100 mmHg range  Plan to continue diuresis with IV furosemide  80 mg tid RAAS inhibition spironolactone , and losartan  B blockade with metoprolol  succinate 100 mg daily.   Persistent atrial fibrillation (HCC) Continue rate control with metoprolol  succinate and diltiazem .  Anticoagulation with apixaban  Continue telemetry monitoring   Hypokalemia Hypomagnesemia   Renal function with serum cr at 1,0 with K at 2,7 and serum bicarbonate at 30  Na 138 and Mg 1.5   Add Kcl 40 meq x2 (she had 40 meq in early am).  Add 4 g Mag sulfate.  Follow up renal function and  electrolytes in am.  Avoid hypotension and nephrotoxic medications   Essential hypertension, benign Continue blood pressure control with losartan  and metoprolol .  Continue diuresis   Type 2 diabetes mellitus (HCC) Continue glucose cover and monitoring with insulin  sliding scale   Lumbar burst fracture (HCC) Continue pain control with oxycodone  as needed 10 mg every 4 hrs.  Out of bed to chair PT and Ot  Obesity, class 3 Calculated BMI is 44.5         Subjective: Patient with improvement in her symptoms, but not yet back to baseline.   Physical Exam: Vitals:   11/27/23 0528 11/27/23 0633 11/27/23 0735 11/27/23 0847  BP: 119/76 122/88 129/80 124/72  Pulse: 85  84   Resp: 18  16   Temp: 97.9 F (36.6 C)  97.8 F (36.6 C)   TempSrc: Oral  Oral   SpO2: 93%  93%   Weight: (!) 140.8 kg     Height:       Neurology awake and alert, deconditioned ENT with mild pallor Cardiovascular with S1 and S2 present irregularly irregular with no gallops or rubs, positive systolic murmur at the right lower sternal border Respiratory with mild rales at bases with no wheezing or rhonchi  Abdomen protuberant with no distention  Positive lower extremity edema ++  Data Reviewed:    Family Communication: no family at the bedside   Disposition: Status is: Inpatient Remains inpatient appropriate because: IV diuresis   Planned Discharge Destination: Home     Author: Elidia Toribio Furnace, MD 11/27/2023 9:18 AM  For on call review [email or web address].

## 2023-11-27 NOTE — Assessment & Plan Note (Addendum)
 Continue pain control with oxycodone  as needed 10 mg every 6 hrs.  Follow up as outpatient.

## 2023-11-27 NOTE — Care Management Important Message (Signed)
 Important Message  Patient Details  Name: Tracie [name] MRN: [id number] Date of Birth: 10-28-1960   Important Message Given:  Yes - Medicare IM     Claretta Deed 11/27/2023, 3:16 PM

## 2023-11-27 NOTE — Assessment & Plan Note (Addendum)
\  Patient was placed on insulin  sliding scale for  glucose cover and monitoring  She had mild hyperglycemia, low 200's during her hospitalization.

## 2023-11-27 NOTE — TOC Progression Note (Signed)
 Transition of Care [facility]) - Progression Note    Patient Details  Name: Sena Hoopingarner MRN: [id number] Date of Birth: 1960/10/05  Transition of Care [facility]) CM/SW Contact  Stephane Powell Jansky, RN Phone Number: 11/27/2023, 4:06 PM  Clinical Narrative:     PT recommending wheelchair. NCM entered order and note. Secure chatted MD to sign. Called Mitch with [employer]   Expected Discharge Plan: Home w Home Health Services Barriers to Discharge: Continued Medical Work up               Expected Discharge Plan and Services   Discharge Planning Services: CM Consult Post Acute Care Choice: Home Health Living arrangements for the past 2 months: Single Family Home                 DME Arranged: N/A DME Agency: NA       HH Arranged: RN, PT HH Agency: [facility] Date [facility] Agency Contacted: 11/26/23 Time HH Agency Contacted: 1415 Representative spoke with at [facility] Agency: Darleene   Social Drivers of Health (SDOH) Interventions SDOH Screenings   Food Insecurity: No Food Insecurity (11/25/2023)  Housing: Unknown (11/26/2023)  Transportation Needs: No Transportation Needs (11/26/2023)  Utilities: Not At Risk (11/25/2023)  Alcohol Screen: Low Risk  (11/26/2023)  Depression (PHQ2-9): Low Risk  (02/26/2020)  Financial Resource Strain: Low Risk  (11/26/2023)  Social Connections: Moderately Isolated (11/25/2023)  Tobacco Use: Medium Risk (11/23/2023)    Readmission Risk Interventions    10/08/2023    2:56 PM  Readmission Risk Prevention Plan  Post Dischage Appt Complete  Medication Screening Complete  Transportation Screening Complete

## 2023-11-28 DIAGNOSIS — E876 Hypokalemia: Secondary | ICD-10-CM

## 2023-11-28 DIAGNOSIS — I10 Essential (primary) hypertension: Secondary | ICD-10-CM | POA: Diagnosis not present

## 2023-11-28 DIAGNOSIS — I5033 Acute on chronic diastolic (congestive) heart failure: Secondary | ICD-10-CM | POA: Diagnosis not present

## 2023-11-28 DIAGNOSIS — I4819 Other persistent atrial fibrillation: Secondary | ICD-10-CM | POA: Diagnosis not present

## 2023-11-28 LAB — GLUCOSE, CAPILLARY
Glucose-Capillary: 178 mg/dL — ABNORMAL HIGH (ref 70–99)
Glucose-Capillary: 195 mg/dL — ABNORMAL HIGH (ref 70–99)
Glucose-Capillary: 168 mg/dL — ABNORMAL HIGH (ref 70–99)
Glucose-Capillary: 174 mg/dL — ABNORMAL HIGH (ref 70–99)

## 2023-11-28 LAB — BASIC METABOLIC PANEL WITH GFR
Anion gap: 12 (ref 5–15)
BUN: 15 mg/dL (ref 8–23)
CO2: 27 mmol/L (ref 22–32)
Calcium: 8.5 mg/dL — ABNORMAL LOW (ref 8.9–10.3)
Chloride: 95 mmol/L — ABNORMAL LOW (ref 98–111)
Creatinine, Ser: 0.96 mg/dL (ref 0.44–1.00)
GFR, Estimated: >60 mL/min (ref >60)
Glucose, Bld: 161 mg/dL — ABNORMAL HIGH (ref 70–99)
Potassium: 3.3 mmol/L — ABNORMAL LOW (ref 3.5–5.1)
Sodium: 134 mmol/L — ABNORMAL LOW (ref 135–145)

## 2023-11-28 LAB — MAGNESIUM: Magnesium: 1.8 mg/dL (ref 1.7–2.4)

## 2023-11-28 MED ORDER — POTASSIUM CHLORIDE CRYS ER 20 MEQ PO TBCR
40.0000 meq | EXTENDED_RELEASE_TABLET | ORAL | Status: AC
  Administered 2023-11-28 (×2): 40 meq via ORAL
  Filled 2023-11-28 (×2): qty 2

## 2023-11-28 MED ORDER — MAGNESIUM SULFATE 2 GM/50ML IV SOLN
2.0000 g | Freq: Once | INTRAVENOUS | Status: AC
  Administered 2023-11-28: 2 g via INTRAVENOUS
  Filled 2023-11-28: qty 50

## 2023-11-28 MED ORDER — GERHARDT'S BUTT CREAM
1.0000 | TOPICAL_CREAM | Freq: Every day | CUTANEOUS | Status: DC
  Administered 2023-11-28: 1 via TOPICAL
  Filled 2023-11-28: qty 60

## 2023-11-28 NOTE — Evaluation (Signed)
 Occupational Therapy Evaluation Patient Details Name: Tracie [name] MRN: [id number] DOB: [birth year] Today's Date: 11/28/2023   History of Present Illness   63 y.o. female presents to [facility] hospital on 11/23/2023 with progressive edema despite compliance with torsemide . Pt admitted for CHF exacerbation. PMHx: Kyphoplasty 2024, DVT, A-fib, T2DM, HFpEF.     Clinical Impressions Pt admitted based on above, and was seen based on problem list below. PTA pt was mod I with ADLs, with her husband assisting with IADLs. Today pt is close to her functional baseline for ADLs. Pt requires min assist for LB dressing, but has reacher and sock aid at home to assist, completes bathing and grooming tasks seated for energy conservation. Functional transfers are  s for safety with RW. Pt with elevated HR during session, max 172 bpm. Reviewed energy conservation and AE from previous admission, both pt and husband verbalizing understanding. Updated DME recommendations to bariatric w/c (26 inches) and bariatric BSC. All education complete no further acute OT needs. Pt at baseline since recent admission, and would benefit from [facility] services upon d/c. OT is signing off on this pt.        If plan is discharge home, recommend the following:   A little help with walking and/or transfers;Assist for transportation;A little help with bathing/dressing/bathroom     Functional Status Assessment   Patient has not had a recent decline in their functional status     Equipment Recommendations   BSC/3in1;Wheelchair (measurements OT);Wheelchair cushion (measurements OT) (Bariatric BSC, Bariatric w/c (26inches))     Recommendations for Other Services         Precautions/Restrictions   Precautions Precautions: Fall Recall of Precautions/Restrictions: Intact Precaution/Restrictions Comments: afib, chronic R hip pain and back pain Restrictions Weight Bearing Restrictions Per Provider Order: No      Mobility Bed Mobility Overal bed mobility: Modified Independent       General bed mobility comments: Able to return to a flat bed, no assist    Transfers Overall transfer level: Needs assistance Equipment used: Rolling walker (2 wheels) Transfers: Sit to/from Stand Sit to Stand: Supervision       General transfer comment: S for safety with lines. Pt uses rocking motion for momentum to stand      Balance Overall balance assessment: Needs assistance Sitting-balance support: No upper extremity supported, Feet supported Sitting balance-Leahy Scale: Good     Standing balance support: Bilateral upper extremity supported, Single extremity supported, During functional activity, Reliant on assistive device for balance Standing balance-Leahy Scale: Fair Standing balance comment: Benefits from RW         ADL either performed or assessed with clinical judgement   ADL Overall ADL's : Needs assistance/impaired;At baseline Eating/Feeding: Set up;Sitting   Grooming: Set up;Sitting       Upper Body Dressing : Set up;Sitting   Lower Body Dressing: Minimal assistance;Sit to/from stand Lower Body Dressing Details (indicate cue type and reason): Min assist for socks Toilet Transfer: Ambulation;BSC/3in1;Rolling walker (2 wheels);Supervision/safety Toilet Transfer Details (indicate cue type and reason): S for safety with lines and leads Toileting- Clothing Manipulation and Hygiene: Supervision/safety;Sit to/from stand       Functional mobility during ADLs: Supervision/safety;Rolling walker (2 wheels) General ADL Comments: S for safety, has AE for LB dressing at baseline prn.     Vision Baseline Vision/History: 0 No visual deficits Vision Assessment?: No apparent visual deficits            Pertinent Vitals/Pain Pain Assessment Pain Assessment: Faces Faces Pain Scale:  Hurts even more Pain Location: back Pain Descriptors / Indicators: Aching, Constant, Discomfort,  Grimacing, Guarding, Sharp, Stabbing, Pressure Pain Intervention(s): Repositioned     Extremity/Trunk Assessment Upper Extremity Assessment Upper Extremity Assessment: Overall WFL for tasks assessed   Lower Extremity Assessment Lower Extremity Assessment: Defer to PT evaluation       Communication Communication Communication: No apparent difficulties   Cognition Arousal: Alert Behavior During Therapy: WFL for tasks assessed/performed Cognition: No apparent impairments   Following commands: Intact       Cueing  General Comments   Cueing Techniques: Verbal cues  HR elevated during session, max HR 172           Home Living Family/patient expects to be discharged to:: Private residence Living Arrangements: Spouse/significant other Available Help at Discharge: Family;Available 24 hours/day Type of Home: Apartment Home Access: Level entry     Home Layout: One level     Bathroom Shower/Tub: Tub/shower unit;Curtain;Sponge bathes at baseline   Bathroom Toilet: Standard Bathroom Accessibility: Yes How Accessible: Accessible via walker Home Equipment: Rolling Walker (2 wheels);Rollator (4 wheels);Cane - quad;Toilet riser;Shower seat;Adaptive equipment Adaptive Equipment: Sock aid;Reacher        Prior Functioning/Environment Prior Level of Function : Independent/Modified Independent             Mobility Comments: Use of rollator, sleeps in recliner ADLs Comments: At baseline sponge bathes and completes grooming seated. Husband completes IADLs    OT Problem List: Decreased strength;Cardiopulmonary status limiting activity        OT Goals(Current goals can be found in the care plan section)   Acute Rehab OT Goals Patient Stated Goal: To get [facility] services OT Goal Formulation: All assessment and education complete, DC therapy Time For Goal Achievement: 12/12/23 Potential to Achieve Goals: Good   AM-PAC OT 6 Clicks Daily Activity     Outcome Measure  Help from another person eating meals?: None Help from another person taking care of personal grooming?: A Little Help from another person toileting, which includes using toliet, bedpan, or urinal?: A Little Help from another person bathing (including washing, rinsing, drying)?: A Little Help from another person to put on and taking off regular upper body clothing?: A Little Help from another person to put on and taking off regular lower body clothing?: A Little 6 Click Score: 19   End of Session Equipment Utilized During Treatment: Rolling walker (2 wheels) Nurse Communication: Mobility status  Activity Tolerance: Patient tolerated treatment well Patient left: in bed;with call bell/phone within reach;with family/visitor present  OT Visit Diagnosis: Unsteadiness on feet (R26.81);Other abnormalities of gait and mobility (R26.89);Muscle weakness (generalized) (M62.81)                Time: 8664-8595 OT Time Calculation (min): 29 min Charges:  OT General Charges $OT Visit: 1 Visit OT Evaluation $OT Eval Moderate Complexity: 1 Mod OT Treatments $Self Care/Home Management : 8-22 mins  Adrianne BROCKS, OT  Acute Rehabilitation Services Office [phone] Secure chat preferred   Adrianne GORMAN [employer] 11/28/2023, 2:26 PM

## 2023-11-28 NOTE — Progress Notes (Signed)
 Physical Therapy Treatment Patient Details Name: Tracie [name] MRN: [id number] DOB: 05/28/1960 Today's Date: 11/28/2023   History of Present Illness 63 y.o. female presents to [facility] hospital on 11/23/2023 with progressive edema despite compliance with torsemide . Pt admitted for CHF exacerbation. PMHx: Kyphoplasty 2024, DVT, A-fib, T2DM, HFpEF.    PT Comments  Pt is progressing towards goals. Currently pt is Mod I for bed mobility, CGA for sit to stand and short in-home distance gait. Pt HR continues to be elevated with activity though not a significant increase from baseline; Baseline 94 bpm supine 167 max during gait with back pain pt HR was significantly elevated up to 193 bpm. Quick recovery of HR once in sitting with back support. Due to pt current functional status, home set up and available assistance at home recommending skilled physical therapy services 3x/week in order to address strength, balance and functional mobility to decrease risk for falls, injury and re-hospitalization.       If plan is discharge home, recommend the following: A little help with walking and/or transfers;Assistance with cooking/housework;Assist for transportation;Help with stairs or ramp for entrance     Equipment Recommendations  Wheelchair (measurements PT);Wheelchair cushion (measurements PT);Other (comment) (bariatric with removable, elevating leg rests for W/C)       Precautions / Restrictions Precautions Precautions: Fall Recall of Precautions/Restrictions: Intact Precaution/Restrictions Comments: afib, chronic R hip pain and back pain Restrictions Weight Bearing Restrictions Per Provider Order: No     Mobility  Bed Mobility Overal bed mobility: Modified Independent             General bed mobility comments: HOB elevated, increased time    Transfers Overall transfer level: Needs assistance Equipment used: Rolling walker (2 wheels) Transfers: Sit to/from Stand Sit to Stand: Contact  guard assist           General transfer comment: CGA for safety with sit to stand 4x during session; good hand placement and pt uses rocking for momentum    Ambulation/Gait Ambulation/Gait assistance: Contact guard assist Gait Distance (Feet): 40 Feet Assistive device: Rolling walker (2 wheels) Gait Pattern/deviations: Step-through pattern, Decreased stride length, Decreased weight shift to right Gait velocity: reduced Gait velocity interpretation: <1.31 ft/sec, indicative of household ambulator   General Gait Details: Short reciprocal gait pattern with low floor clearance and mid foot initial contact. Pt has moderate to minimal support on RW initially. Increased back pain with distance requiring max UE support at RW. Pt HR 148-167 during gait; back pain increased in room near recliner with HR up to 193 bpm      Balance Overall balance assessment: Needs assistance Sitting-balance support: No upper extremity supported, Feet supported Sitting balance-Leahy Scale: Good     Standing balance support: Bilateral upper extremity supported, Single extremity supported, During functional activity, Reliant on assistive device for balance Standing balance-Leahy Scale: Fair Standing balance comment: no overt LOB, CGA for safety        Communication Communication Communication: No apparent difficulties  Cognition Arousal: Alert Behavior During Therapy: WFL for tasks assessed/performed   PT - Cognitive impairments: No apparent impairments     Following commands: Intact      Cueing Cueing Techniques: Verbal cues     General Comments General comments (skin integrity, edema, etc.): Pt HR 148-167 during gait; back pain increased in room near recliner with HR up to 193 bpm      Pertinent Vitals/Pain Pain Assessment Pain Assessment: Faces Faces Pain Scale: Hurts whole lot Breathing: occasional labored breathing,  short period of hyperventilation Pain Location: back Pain Descriptors  / Indicators: Aching, Constant, Discomfort, Grimacing, Guarding, Sharp, Stabbing, Pressure Pain Intervention(s): Limited activity within patient's tolerance, Monitored during session     PT Goals (current goals can now be found in the care plan section) Acute Rehab PT Goals Patient Stated Goal: to return home and for my R hip to hurt less PT Goal Formulation: With patient Time For Goal Achievement: 12/10/23 Potential to Achieve Goals: Fair Additional Goals Additional Goal #1: Pt will report 1/4 DOE or less when ambulating for >150' to demonstrate improved activity tolerance Progress towards PT goals: Progressing toward goals    Frequency    Min 2X/week      PT Plan  Continue with current POC       AM-PAC PT 6 Clicks Mobility   Outcome Measure  Help needed turning from your back to your side while in a flat bed without using bedrails?: None Help needed moving from lying on your back to sitting on the side of a flat bed without using bedrails?: None Help needed moving to and from a bed to a chair (including a wheelchair)?: A Little Help needed standing up from a chair using your arms (e.g., wheelchair or bedside chair)?: A Little Help needed to walk in hospital room?: A Little Help needed climbing 3-5 steps with a railing? : Total 6 Click Score: 18    End of Session Equipment Utilized During Treatment: Gait belt Activity Tolerance: Treatment limited secondary to medical complications (Comment);Patient limited by pain;Patient tolerated treatment well Patient left: in chair;with call bell/phone within reach Nurse Communication: Mobility status PT Visit Diagnosis: Other abnormalities of gait and mobility (R26.89);Muscle weakness (generalized) (M62.81)     Time: 8949-8876 PT Time Calculation (min) (ACUTE ONLY): 33 min  Charges:    $Therapeutic Activity: 8-22 mins PT General Charges $$ ACUTE PT VISIT: 1 Visit                    Dorothyann Maier, DPT, CLT  Acute  Rehabilitation Services Office: [phone] (Secure chat preferred)    Dorothyann VEAR Maier 11/28/2023, 11:31 AM

## 2023-11-28 NOTE — Plan of Care (Signed)
  Problem: Education: Goal: Ability to describe self-care measures that may prevent or decrease complications (Diabetes Survival Skills Education) will improve Outcome: Progressing Goal: Individualized Educational Video(s) Outcome: Progressing   Problem: Coping: Goal: Ability to adjust to condition or change in health will improve Outcome: Progressing   Problem: Fluid Volume: Goal: Ability to maintain a balanced intake and output will improve Outcome: Progressing   Problem: Health Behavior/Discharge Planning: Goal: Ability to identify and utilize available resources and services will improve Outcome: Progressing Goal: Ability to manage health-related needs will improve Outcome: Progressing   Problem: Metabolic: Goal: Ability to maintain appropriate glucose levels will improve Outcome: Progressing   Problem: Nutritional: Goal: Maintenance of adequate nutrition will improve Outcome: Progressing Goal: Progress toward achieving an optimal weight will improve Outcome: Progressing   Problem: Skin Integrity: Goal: Risk for impaired skin integrity will decrease Outcome: Progressing   Problem: Tissue Perfusion: Goal: Adequacy of tissue perfusion will improve Outcome: Progressing   Problem: Education: Goal: Knowledge of General Education information will improve Description: Including pain rating scale, medication(s)/side effects and non-pharmacologic comfort measures Outcome: Progressing   Problem: Health Behavior/Discharge Planning: Goal: Ability to manage health-related needs will improve Outcome: Progressing   Problem: Clinical Measurements: Goal: Ability to maintain clinical measurements within normal limits will improve Outcome: Progressing Goal: Will remain free from infection Outcome: Progressing Goal: Diagnostic test results will improve Outcome: Progressing Goal: Respiratory complications will improve Outcome: Progressing Goal: Cardiovascular complication will  be avoided Outcome: Progressing   Problem: Activity: Goal: Risk for activity intolerance will decrease Outcome: Progressing   Problem: Nutrition: Goal: Adequate nutrition will be maintained Outcome: Progressing   Problem: Coping: Goal: Level of anxiety will decrease Outcome: Progressing   Problem: Elimination: Goal: Will not experience complications related to bowel motility Outcome: Progressing Goal: Will not experience complications related to urinary retention Outcome: Progressing   Problem: Pain Managment: Goal: General experience of comfort will improve and/or be controlled Outcome: Progressing   Problem: Safety: Goal: Ability to remain free from injury will improve Outcome: Progressing   Problem: Skin Integrity: Goal: Risk for impaired skin integrity will decrease Outcome: Progressing   Problem: Education: Goal: Ability to demonstrate management of disease process will improve Outcome: Progressing Goal: Ability to verbalize understanding of medication therapies will improve Outcome: Progressing Goal: Individualized Educational Video(s) Outcome: Progressing   Problem: Activity: Goal: Capacity to carry out activities will improve Outcome: Progressing

## 2023-11-28 NOTE — Progress Notes (Signed)
 Progress Note   Patient: Tracie [name] FMW:[id number] DOB: 1961/03/17 DOA: 11/23/2023     4 DOS: the patient was seen and examined on 11/28/2023   Brief hospital course: [name] was admitted to the hospital with the working diagnosis of heart failure exacerbation.    62/F with type 2 diabetes, hypertension, history of DVT, paroxysmal A-fib on Eliquis , chronic diastolic CHF, chronic back pain recently hospitalized 6/26>7/10, for severe volume overload, complicated by back pain, lumbar compression fractures etc. she was discharged home on torsemide . - Reports progressive edema 3 weeks after discharge, reports initial compliance with torsemide , then developed a rash/skin infection in her lower legs, called her PCP who advised her to hold torsemide , subsequently had worsening swelling while off diuretics for 1 week back in the emergency room 8/16. - In the ER hypertensive, rate controlled A-fib, chest x-ray with cardiomegaly pulmonary vascular congestion, creatinine 0.8, BNP 290 - Admitted, restarted on diuretics  08/21 responding well to diuresis, continue to have atrial fibrillation with RVR.   Assessment and Plan: * Acute on chronic diastolic CHF (congestive heart failure) (HCC) Echocardiogram with preserved LV systolic function with EF 60 to 65%, mild LVH, RV systolic function preserved, LA and RA with moderate dilatation, no significant valvular disease.  Urine output is 1,900  ml Systolic blood pressure 100 mmHg range  RAAS inhibition spironolactone , and losartan  B blockade with metoprolol  succinate 100 mg daily.  Hold on IV furosemide  for today and transition to tomorrow.   Persistent atrial fibrillation (HCC) Continue rate control with metoprolol  succinate and diltiazem .  Patient did not get AV blockade consistently yesterday due to low blood pressure  Anticoagulation with apixaban  Continue telemetry monitoring   Hypokalemia Hypomagnesemia   Today renal function with  serum cr at 0,96 with K at 3,3 and serum bicarbonate at 27  Na 134 and Mg 1,8   Add Kcl 40 meq x2 and 2 g Mag sulfate.  Follow up renal function and electrolytes in am.  Avoid hypotension and nephrotoxic medications   Essential hypertension, benign Continue blood pressure control with losartan  and metoprolol .   Type 2 diabetes mellitus (HCC) Continue glucose cover and monitoring with insulin  sliding scale   Lumbar burst fracture (HCC) Continue pain control with oxycodone  as needed 10 mg every 4 hrs.  Out of bed to chair PT and Ot  Obesity, class 3 Calculated BMI is 44.5      Subjective: Patient with improvement in dyspnea and edema, continue to have back pan and thigh pain,   Physical Exam: Vitals:   11/28/23 0530 11/28/23 0649 11/28/23 0739 11/28/23 1141  BP:  130/76 (!) 156/100 111/67  Pulse:   (!) 104 (!) 102  Resp:   16 15  Temp:   97.9 F (36.6 C) 97.8 F (36.6 C)  TempSrc:   Oral Oral  SpO2:   94% 94%  Weight: (!) 138.6 kg     Height:       Neurology awake and alert ENT with mild pallor Cardiovascular with S1 and S2 present irregularly irregular with no gallops or rubs, no murmurs Mild JVD Respiratory with mild rales at bases with no wheezing or rhonchi  Abdomen soft, and non tender, protuberant Positive non pitting lower extremity edema  Data Reviewed:    Family Communication: no family at the bedside   Disposition: Status is: Inpatient Remains inpatient appropriate because: atrial fibrillation control and recovering heart failure   Planned Discharge Destination: Home     Author: Elidia Toribio Furnace,  MD 11/28/2023 3:16 PM  For on call review [email or web address].

## 2023-11-28 NOTE — TOC Progression Note (Signed)
 Transition of Care (TOC) - Progression Note   [employer] spoke with patient's husband regarding wheelchair . They do not want the wheelchair delivered to hospital. They want the wheelchair delivered to home when they get there. [employer] contact information on AVS  Patient Details  Name: Tracie [name] MRN: [id number] Date of Birth: February 04, 1961  Transition of Care [facility]) CM/SW Contact  Angely Dietz, Powell Jansky, RN Phone Number: 11/28/2023, 10:18 AM  Clinical Narrative:       Expected Discharge Plan: Home w Home Health Services Barriers to Discharge: Continued Medical Work up               Expected Discharge Plan and Services   Discharge Planning Services: CM Consult Post Acute Care Choice: Home Health Living arrangements for the past 2 months: Single Family Home                 DME Arranged: N/A DME Agency: NA       HH Arranged: RN, PT HH Agency: [facility] Date [facility] Agency Contacted: 11/26/23 Time HH Agency Contacted: 1415 Representative spoke with at [facility] Agency: Darleene   Social Drivers of Health (SDOH) Interventions SDOH Screenings   Food Insecurity: No Food Insecurity (11/25/2023)  Housing: Unknown (11/26/2023)  Transportation Needs: No Transportation Needs (11/26/2023)  Utilities: Not At Risk (11/25/2023)  Alcohol Screen: Low Risk  (11/26/2023)  Depression (PHQ2-9): Low Risk  (02/26/2020)  Financial Resource Strain: Low Risk  (11/26/2023)  Social Connections: Moderately Isolated (11/25/2023)  Tobacco Use: Medium Risk (11/23/2023)    Readmission Risk Interventions    10/08/2023    2:56 PM  Readmission Risk Prevention Plan  Post Dischage Appt Complete  Medication Screening Complete  Transportation Screening Complete

## 2023-11-28 NOTE — Plan of Care (Signed)

## 2023-11-29 DIAGNOSIS — I5033 Acute on chronic diastolic (congestive) heart failure: Secondary | ICD-10-CM | POA: Diagnosis not present

## 2023-11-29 DIAGNOSIS — E876 Hypokalemia: Secondary | ICD-10-CM | POA: Diagnosis not present

## 2023-11-29 DIAGNOSIS — I4819 Other persistent atrial fibrillation: Secondary | ICD-10-CM | POA: Diagnosis not present

## 2023-11-29 DIAGNOSIS — I10 Essential (primary) hypertension: Secondary | ICD-10-CM | POA: Diagnosis not present

## 2023-11-29 LAB — GLUCOSE, CAPILLARY
Glucose-Capillary: 165 mg/dL — ABNORMAL HIGH (ref 70–99)
Glucose-Capillary: 192 mg/dL — ABNORMAL HIGH (ref 70–99)
Glucose-Capillary: 155 mg/dL — ABNORMAL HIGH (ref 70–99)
Glucose-Capillary: 142 mg/dL — ABNORMAL HIGH (ref 70–99)

## 2023-11-29 LAB — BASIC METABOLIC PANEL WITH GFR
Anion gap: 8 (ref 5–15)
BUN: 17 mg/dL (ref 8–23)
CO2: 29 mmol/L (ref 22–32)
Calcium: 9.1 mg/dL (ref 8.9–10.3)
Chloride: 97 mmol/L — ABNORMAL LOW (ref 98–111)
Creatinine, Ser: 1.14 mg/dL — ABNORMAL HIGH (ref 0.44–1.00)
GFR, Estimated: 53 mL/min — ABNORMAL LOW (ref >60)
Glucose, Bld: 165 mg/dL — ABNORMAL HIGH (ref 70–99)
Potassium: 3.7 mmol/L (ref 3.5–5.1)
Sodium: 134 mmol/L — ABNORMAL LOW (ref 135–145)

## 2023-11-29 LAB — MAGNESIUM: Magnesium: 2 mg/dL (ref 1.7–2.4)

## 2023-11-29 MED ORDER — TORSEMIDE 20 MG PO TABS: 60.0000 mg | ORAL_TABLET | Freq: Every day | ORAL | Status: DC

## 2023-11-29 MED ORDER — FUROSEMIDE 40 MG PO TABS
60.0000 mg | ORAL_TABLET | Freq: Every day | ORAL | Status: DC
  Administered 2023-11-29 – 2023-12-01 (×3): 60 mg via ORAL
  Filled 2023-11-29 (×3): qty 1

## 2023-11-29 MED ORDER — POTASSIUM CHLORIDE CRYS ER 20 MEQ PO TBCR
40.0000 meq | EXTENDED_RELEASE_TABLET | Freq: Once | ORAL | Status: AC
  Administered 2023-11-29: 40 meq via ORAL
  Filled 2023-11-29: qty 2

## 2023-11-29 MED ORDER — DILTIAZEM HCL ER COATED BEADS 120 MG PO CP24
120.0000 mg | ORAL_CAPSULE | Freq: Every day | ORAL | Status: DC
  Administered 2023-11-29 – 2023-12-01 (×3): 120 mg via ORAL
  Filled 2023-11-29 (×3): qty 1

## 2023-11-29 MED ORDER — SPIRONOLACTONE 12.5 MG HALF TABLET
12.5000 mg | ORAL_TABLET | Freq: Every day | ORAL | Status: DC
  Administered 2023-11-30 – 2023-12-01 (×2): 12.5 mg via ORAL
  Filled 2023-11-29 (×2): qty 1

## 2023-11-29 NOTE — Progress Notes (Addendum)
 HEART & VASCULAR TRANSITION OF CARE CONSULT NOTE     Referring Physician: Dr. Noralee PCP: Rolinda Millman, MD  Cardiologist: Dr. Wonda  Chief Complaint: HFpEF  HPI: Referred to clinic by Dr. Arrien for heart failure consultation.   Tracie [name] is a 63 y.o. 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  She was diagnosed with atrial fibrillation in 02/2020. Had been seen by [facility] in the past but was lost to follow-up on multiple occasions. She was resistant to anticoagulation in the past d/t concern for side effects and has declined rhythm control options.   Admitted with back pain d/t compression fracture in 11/24 and incidentally found to be in atrial fibrillation with RVR. She agreed to anticoagulation and rate control meds. Later cancelled all subsequent appointments with Cardiology. She was admitted in 06/25 with acute on chronic HFpEF in setting of Afib with RVR and noncompliance. She ultimately agreed to anticoagulation during the admission. She was diuresed and started on GDMT. Echo with LVEF 60-65%, RV okay, moderate BAE. Patient readmitted 11/23/23 with acute on chronic CHF. She stopped taking diuretics after developing a rash on her legs. She held Torsemide  then developed volume overload.   She is here today for post hospital CHF follow-up. She was discharged from the hospital yesterday. She is taking all medications except lasix  and diltiazem , she hasn't picked them up from the pharmacy yet. Lower extremity edema significantly improved. No shortness of breath, orthopnea or PND. Unable to weigh at home. Reports she has been unable to find a scale that she can stand on. She has trouble bearing weight due to chronic hip issues. Should be getting HH RN/PT.  Past Medical History:  Diagnosis Date   Atrial fibrillation (HCC)    Celiac  artery dissection (HCC)    Diabetes mellitus (HCC)    History of DVT (deep vein thrombosis) 2010   Hyperlipidemia    Hypertension    Morbid obesity (HCC)     Current Outpatient Medications  Medication Sig Dispense Refill   acetaminophen  (TYLENOL ) 325 MG tablet Take 2 tablets (650 mg total) by mouth every 6 (six) hours as needed for mild pain (pain score 1-3) or moderate pain (pain score 4-6).     apixaban  (ELIQUIS ) 5 MG TABS tablet Take 1 tablet (5 mg total) by mouth 2 (two) times daily. 60 tablet 0   empagliflozin  (JARDIANCE ) 25 MG TABS tablet Take 25 mg by mouth daily.     gabapentin  (NEURONTIN ) 100 MG capsule Take 2 capsules (200 mg total) by mouth 2 (two) times daily. 120 capsule 0   glipiZIDE  2.5 MG TABS Take 2.5 mg by mouth daily. (Patient taking differently: Take 5 mg by mouth daily.) 30 tablet 0   lactulose  (CHRONULAC ) 10 GM/15ML solution Take 30 mLs (20 g total) by mouth daily. (Patient taking differently: Take 20 g by mouth daily as needed for mild constipation.) 946 mL 0   losartan  (COZAAR ) 50 MG tablet Take 1 tablet (50 mg total) by mouth daily. 30 tablet 0   methocarbamol  (ROBAXIN ) 750 MG tablet Take 1 tablet (750 mg total) by mouth every 8 (eight) hours as needed for muscle spasms. 90 tablet 0   metoprolol  succinate (TOPROL -XL) 100 MG 24 hr tablet Take 100 mg by mouth daily. Take with or immediately following a meal.     Oxycodone  HCl 10 MG TABS  Take 1 tablet (10 mg total) by mouth every 6 (six) hours as needed for severe pain (pain score 7-10). 30 tablet 0   potassium chloride  SA (KLOR-CON  M) 20 MEQ tablet Take 20 mEq by mouth daily.     spironolactone  (ALDACTONE ) 25 MG tablet Take 0.5 tablets (12.5 mg total) by mouth daily. 30 tablet 0   vitamin B-12 (CYANOCOBALAMIN ) 500 MCG tablet Take 500 mcg by mouth daily.     diltiazem  (CARDIZEM  SR) 60 MG 12 hr capsule Take 1 capsule (60 mg total) by mouth every 12 (twelve) hours. (Patient not taking: Reported on 12/02/2023) 60 capsule 0    furosemide  (LASIX ) 20 MG tablet Take 3 tablets (60 mg total) by mouth daily. (Patient not taking: Reported on 12/02/2023) 90 tablet 0   No current facility-administered medications for this encounter.    Allergies  Allergen Reactions   Codeine Anaphylaxis    Throat swelling per records from [facility]   Crestor [Rosuvastatin] Other (See Comments)    Myalgias  Weakness   Diltiazem      Chest and throat burning   Metoprolol  Tartrate     Severe shoulder pain      Social History   Socioeconomic History   Marital status: Married    Spouse name: Ranal   Number of children: 1   Years of education: Not on file   Highest education level: High school graduate  Occupational History   Occupation: Disability  Tobacco Use   Smoking status: Former    Current packs/day: 0.00    Average packs/day: 1 pack/day for 20.0 years (20.0 ttl pk-yrs)    Types: Cigarettes    Start date: 84    Quit date: 2010    Years since quitting: 15.6   Smokeless tobacco: Never  Vaping Use   Vaping status: Never Used  Substance and Sexual Activity   Alcohol use: Never   Drug use: Not Currently   Sexual activity: Yes  Other Topics Concern   Not on file  Social History Narrative   Not on file   Social Drivers of Health   Financial Resource Strain: Low Risk  (11/26/2023)   Overall Financial Resource Strain (CARDIA)    Difficulty of Paying Living Expenses: Not hard at all  Food Insecurity: No Food Insecurity (11/25/2023)   Hunger Vital Sign    Worried About Running Out of Food in the Last Year: Never true    Ran Out of Food in the Last Year: Never true  Transportation Needs: No Transportation Needs (11/26/2023)   PRAPARE - Administrator, Civil Service (Medical): No    Lack of Transportation (Non-Medical): No  Physical Activity: Not on file  Stress: Not on file  Social Connections: Moderately Isolated (11/25/2023)   Social Connection and Isolation Panel    Frequency of Communication  with Friends and Family: More than three times a week    Frequency of Social Gatherings with Friends and Family: Once a week    Attends Religious Services: Never    Database administrator or Organizations: No    Attends Banker Meetings: Never    Marital Status: Married  Catering manager Violence: Not At Risk (11/25/2023)   Humiliation, Afraid, Rape, and Kick questionnaire    Fear of Current or Ex-Partner: No    Emotionally Abused: No    Physically Abused: No    Sexually Abused: No      Family History  Problem Relation Age of Onset  Hypertension Mother    Heart attack Father    Heart disease Brother    Healthy Son    Breast cancer Neg Hx     Vitals:   12/02/23 0827  BP: 130/80  Pulse: (!) 113  SpO2: 96%  Weight: (!) 139.3 kg (307 lb)  Height: 5' 10 (1.778 m)    PHYSICAL EXAM: General: Well appearing. Arrived in wheelchair. Cor: JVP difficult d/t neck size. Irregular rhythm. No murmurs. Lungs: clear Abdomen: obese, soft, nontender, nondistended.  Extremities: no edema Neuro: alert & oriented x 3. Affect pleasant.  ECG: Atrial fibrillation 112 bpm   ASSESSMENT & PLAN: HFpEF -Echo 06/25 LVEF 60-65%, RV okay, moderate BAE -NYHA III, confounded by body habitus, physical deconditioning and orthopedic issues -Volume looks okay on exam. She is waiting for lasix  Rx to be ready at Pharmacy. Discussed importance of adherence with diuretics going forward -Continue Losartan  50 mg daily -Continue spiro 12.5 mg daily -Continue Jardiance  25 mg daily (DM dose, watch for UTI)  Likely permanent atrial fibrillation -Rate not controlled. She has taken her metoprolol  xl but has not taken diltiazem  today (needs to pick up from pharmacy) -Refused DCCV and other rhythm control options in the past. Doubt she would maintain SR at this point -Continue Eliquis  5 mg BID. Reiterated indications for anticoagulation -Consider sleep study  Morbid obesity Body mass index is  44.05 kg/m. -Likely contributing to the above -Consider sleep study -Consider referral for GLP1-RA in the future  Contacted her pharmacy to review her cardiac medications. Her diltiazem  and lasix  prescriptions should be available for pick up today.   Referred to HFSW (PCP, Medications, Transportation, ETOH Abuse, Drug Abuse, Insurance, Financial ): No Refer to Pharmacy: No Refer to Home Health: No Refer to Advanced Heart Failure Clinic: No Refer to General Cardiology: Yes   Follow up  PRN. She is high risk for readmission, recommended 1 additional visit in TOC to assess volume and adherence with medications, she declined. She would prefer to follow-up with Dr. Wonda. Next available appointment with Cardiology arranged. Information relayed to patient in the clinic.

## 2023-11-29 NOTE — Progress Notes (Signed)
 Tracie [name] is stating she can not take 25 mg Spironalactone, she only takes 12.5mg . it gives her blurry vision, HA, dizzyness and nausea. She also can not take torsemide  because it makes her legs break out. BP 116/84, map 92, HR 88, RR 24, SaO2 93%. Dr. Noralee notified.

## 2023-11-29 NOTE — Plan of Care (Signed)
  Problem: Cardiac: Goal: Ability to achieve and maintain adequate cardiopulmonary perfusion will improve Outcome: Progressing  A-fib w/RVR with increased activity.

## 2023-11-29 NOTE — Progress Notes (Signed)
 Mobility Specialist: Progress Note   11/29/23 1500  Mobility  Activity Ambulated with assistance  Level of Assistance Contact guard assist, steadying assist  Assistive Device Front wheel walker  Distance Ambulated (ft) 60 ft  Activity Response Tolerated well  Mobility Referral Yes  Mobility visit 1 Mobility  Mobility Specialist Start Time (ACUTE ONLY) 1043  Mobility Specialist Stop Time (ACUTE ONLY) 1053  Mobility Specialist Time Calculation (min) (ACUTE ONLY) 10 min    Pt received on BSC, pleasant and agreeable to mobility session. CGA throughout. C/o fatigue midway through session and audibly SOB. HR peaked to 179 during ambulation. Returned to room without fault. Left in chair with all needs met, call bell in reach.  Ileana Lute Mobility Specialist Please contact via SecureChat or Rehab office at [phone]

## 2023-11-29 NOTE — Progress Notes (Signed)
 Progress Note   Patient: Tracie [name] FMW:[id number] DOB: 1960/09/11 DOA: 11/23/2023     5 DOS: the patient was seen and examined on 11/29/2023   Brief hospital course: Mrs. Staff was admitted to the hospital with the working diagnosis of heart failure exacerbation.   62/F with type 2 diabetes, hypertension, history of DVT, paroxysmal A-fib, chronic diastolic CHF, chronic back pain who presented with dyspnea. Recent hospitalization 10/02/23 for 10/17/23, for heart failure exacerbation. She was diuresed 19,389 ml and discharged home with instructions to take torsemide  40 mg po daily. She was placed on guideline directed medical therapy for heart failure.  Noticed edema 3 weeks after discharge, reports initial compliance with torsemide , then developed a rash/ in her lower legs, called her PCP who advised her to hold torsemide , subsequently had worsening edema while off diuretics.  On her initial physical examination her blood pressure was  160/62, HR 57, RR 24 and 02 saturation 100%  Lungs with decreased breath sounds at bases, heart with S1 and S2 present irregularly irregular with no gallops, rubs or murmurs, abdomen protuberant but non distended, positive lower extremity edema.   Na 138, K 4.5 Cl 107 bicarbonate 23 glucose 151 bun 13 cr 0,88  BNP 290  High sensitive troponin 5 and 5  Wbc 6.8 hgb 13.8 plt 241   Chest radiograph with right rotation, positive cardiomegaly, with bilateral hilar vascular congestion, no effusions or infiltrates.   EKG 100 bpm, normal axis, normal intervals, qtc 448, atrial flutter with variable block, no significant ST segment or T wave changes.   08/21 responding well to diuresis, continue to have atrial fibrillation with RVR.  08/22 improved rate control atrial fibrillation, starting long acting diltiazem .   Assessment and Plan: * Acute on chronic diastolic CHF (congestive heart failure) (HCC) Echocardiogram with preserved LV systolic function with EF  60 to 65%, mild LVH, RV systolic function preserved, LA and RA with moderate dilatation, no significant valvular disease.  Urine output is 700  ml Systolic blood pressure 100 mmHg range  RAAS inhibition with spironolactone  and losartan  B blockade with metoprolol  succinate 100 mg daily.  Resume oral loop diuretic with torsemide  60 mg po daily.   Persistent atrial fibrillation (HCC) Telemetry with improved rate control, tolerating well oral diltiazem .   Plan to continue metoprolol  succinate and transition to long acting diltiazem .  Continue anticoagulation with apixaban .  Continue telemetry monitoring   Hypokalemia Hypomagnesemia hyponatremia   Renal function with serum cr at 1.14 with K at 3,7 and serum bicarbonate at 29  Na 134 and Mg 2.0   Add 40 meq Kcl today Follow up renal function and electrolytes.  Avoid hypotension and nephrotoxic medications.   Essential hypertension, benign Continue blood pressure control with losartan  and metoprolol .   Type 2 diabetes mellitus (HCC) Continue glucose cover and monitoring with insulin  sliding scale   Lumbar burst fracture (HCC) Continue pain control with oxycodone  as needed 10 mg every 4 hrs.  Out of bed to chair PT and Ot  Obesity, class 3 Calculated BMI is 44.5      Subjective: Patient is feeling better, no chest pain and no palpitations, improved mobility.   Physical Exam: Vitals:   11/28/23 1937 11/28/23 2300 11/29/23 0354 11/29/23 0738  BP: 103/71 114/80 126/69 101/62  Pulse: 84 79 77 89  Resp: 14 17 15 13   Temp: 98.1 F (36.7 C) 98 F (36.7 C) 97.7 F (36.5 C) 97.7 F (36.5 C)  TempSrc: Oral Oral Oral Oral  SpO2: 94% 91% 96% 94%  Weight:   (!) 139.6 kg   Height:       Neurology awake and alert ENT with mild pallor with no icterus Cardiovascular with S1 and S2 present, irregularly irregular with no gallops or rubs, positive systolic murmur at the right lower sternal border Respiratory with no rales or  wheezing, no rhonchi  Abdomen protuberant but not distended and not tender Trace non pitting lower extremity edema   Data Reviewed:    Family Communication: no family at the bedside   Disposition: Status is: Inpatient Remains inpatient appropriate because: recovering heart failure and telemetry monitoring   Planned Discharge Destination: Home     Author: Elidia Toribio Furnace, MD 11/29/2023 10:05 AM  For on call review [email or web address].

## 2023-11-30 DIAGNOSIS — I5033 Acute on chronic diastolic (congestive) heart failure: Secondary | ICD-10-CM | POA: Diagnosis not present

## 2023-11-30 DIAGNOSIS — I4819 Other persistent atrial fibrillation: Secondary | ICD-10-CM | POA: Diagnosis not present

## 2023-11-30 DIAGNOSIS — I10 Essential (primary) hypertension: Secondary | ICD-10-CM | POA: Diagnosis not present

## 2023-11-30 DIAGNOSIS — E876 Hypokalemia: Secondary | ICD-10-CM | POA: Diagnosis not present

## 2023-11-30 LAB — BASIC METABOLIC PANEL WITH GFR
Anion gap: 7 (ref 5–15)
BUN: 19 mg/dL (ref 8–23)
CO2: 27 mmol/L (ref 22–32)
Calcium: 9 mg/dL (ref 8.9–10.3)
Chloride: 98 mmol/L (ref 98–111)
Creatinine, Ser: 1.04 mg/dL — ABNORMAL HIGH (ref 0.44–1.00)
GFR, Estimated: 60 mL/min — ABNORMAL LOW (ref >60)
Glucose, Bld: 188 mg/dL — ABNORMAL HIGH (ref 70–99)
Potassium: 3.7 mmol/L (ref 3.5–5.1)
Sodium: 132 mmol/L — ABNORMAL LOW (ref 135–145)

## 2023-11-30 LAB — GLUCOSE, CAPILLARY
Glucose-Capillary: 171 mg/dL — ABNORMAL HIGH (ref 70–99)
Glucose-Capillary: 161 mg/dL — ABNORMAL HIGH (ref 70–99)
Glucose-Capillary: 208 mg/dL — ABNORMAL HIGH (ref 70–99)
Glucose-Capillary: 204 mg/dL — ABNORMAL HIGH (ref 70–99)

## 2023-11-30 LAB — MAGNESIUM: Magnesium: 1.7 mg/dL (ref 1.7–2.4)

## 2023-11-30 MED ORDER — MAGNESIUM OXIDE -MG SUPPLEMENT 400 (240 MG) MG PO TABS
400.0000 mg | ORAL_TABLET | Freq: Two times a day (BID) | ORAL | Status: AC
  Administered 2023-11-30 – 2023-12-01 (×2): 400 mg via ORAL
  Filled 2023-11-30 (×2): qty 1

## 2023-11-30 MED ORDER — MAGNESIUM SULFATE 2 GM/50ML IV SOLN
2.0000 g | Freq: Once | INTRAVENOUS | Status: DC
  Filled 2023-11-30: qty 50

## 2023-11-30 MED ORDER — POTASSIUM CHLORIDE CRYS ER 20 MEQ PO TBCR
40.0000 meq | EXTENDED_RELEASE_TABLET | Freq: Once | ORAL | Status: AC
  Administered 2023-11-30: 40 meq via ORAL
  Filled 2023-11-30: qty 2

## 2023-11-30 MED ORDER — PANTOPRAZOLE SODIUM 40 MG PO TBEC
40.0000 mg | DELAYED_RELEASE_TABLET | Freq: Every day | ORAL | Status: DC
  Administered 2023-11-30 – 2023-12-01 (×2): 40 mg via ORAL
  Filled 2023-11-30 (×2): qty 1

## 2023-11-30 NOTE — Plan of Care (Signed)

## 2023-11-30 NOTE — Progress Notes (Signed)
 Progress Note   Patient: Tracie [name] FMW:[id number] DOB: 21-Jun-1960 DOA: 11/23/2023     6 DOS: the patient was seen and examined on 11/30/2023   Brief hospital course: Mrs. Treu was admitted to the hospital with the working diagnosis of heart failure exacerbation.   62/F with type 2 diabetes, hypertension, history of DVT, paroxysmal A-fib, chronic diastolic CHF, chronic back pain who presented with dyspnea. Recent hospitalization 10/02/23 for 10/17/23, for heart failure exacerbation. She was diuresed 19,389 ml and discharged home with instructions to take torsemide  40 mg po daily. She was placed on guideline directed medical therapy for heart failure.  Noticed edema 3 weeks after discharge, reports initial compliance with torsemide , then developed a rash/ in her lower legs, called her PCP who advised her to hold torsemide , subsequently had worsening edema while off diuretics.  On her initial physical examination her blood pressure was  160/62, HR 57, RR 24 and 02 saturation 100%  Lungs with decreased breath sounds at bases, heart with S1 and S2 present irregularly irregular with no gallops, rubs or murmurs, abdomen protuberant but non distended, positive lower extremity edema.   Na 138, K 4.5 Cl 107 bicarbonate 23 glucose 151 bun 13 cr 0,88  BNP 290  High sensitive troponin 5 and 5  Wbc 6.8 hgb 13.8 plt 241   Chest radiograph with right rotation, positive cardiomegaly, with bilateral hilar vascular congestion, no effusions or infiltrates.   EKG 100 bpm, normal axis, normal intervals, qtc 448, atrial flutter with variable block, no significant ST segment or T wave changes.   08/21 responding well to diuresis, continue to have atrial fibrillation with RVR.  08/22 improved rate control atrial fibrillation, starting long acting diltiazem .  08/23 better controlled atrial fibrillation, resumed furosemide  with good toleration.   Assessment and Plan: * Acute on chronic diastolic CHF  (congestive heart failure) (HCC) Echocardiogram with preserved LV systolic function with EF 60 to 65%, mild LVH, RV systolic function preserved, LA and RA with moderate dilatation, no significant valvular disease.  Urine output is 2,125  ml Systolic blood pressure 110 mmHg range  RAAS inhibition with spironolactone  and losartan  B blockade with metoprolol  succinate 100 mg daily.  Patient on furosemide  60 mg daily   Persistent atrial fibrillation (HCC) Improved rate control to 80 bpm, atrial fibrillation.   Plan to continue metoprolol  succinate and long acting diltiazem  120 mg.   Continue anticoagulation with apixaban .  Continue telemetry monitoring  Out of bed and ambulate  Hypokalemia Hypomagnesemia hyponatremia   Volume status has improved, renal function today with serum cr at 1,0 with K at 3,7 and serum bicarbonate at 27  Na 132 and Mg 1.7   Add 40 meq Kcl and 2 g mag sulfate.  Follow up renal function and electrolytes.  Avoid hypotension and nephrotoxic medications.   Essential hypertension, benign Continue blood pressure control with losartan  and metoprolol .   Type 2 diabetes mellitus (HCC) Continue glucose cover and monitoring with insulin  sliding scale   Lumbar burst fracture (HCC) Continue pain control with oxycodone  as needed 10 mg every 4 hrs.  Out of bed to chair PT and Ot  Obesity, class 3 Calculated BMI is 44.5       Subjective: patient is feeling better, no dyspnea or chest pain, positive reflux and feeling weak,   Physical Exam: Vitals:   11/29/23 2322 11/30/23 0600 11/30/23 0628 11/30/23 0725  BP: 109/84 110/74  112/86  Pulse: 75 80    Resp: 12 16  Temp: 97.6 F (36.4 C) 97.9 F (36.6 C)  97.8 F (36.6 C)  TempSrc: Oral Oral  Oral  SpO2: 93% 93%    Weight:   (!) 139.3 kg   Height:       Neurology awake and alert ENT with mild pallor  Cardiovascular with S1 and S2 present, irregularly irregular with no gallops, rubs or  murmurs Respiratory with no rales or wheezing, no rhonchi  Abdomen protuberant but not distended or tender Lower extremity with no ankle edema   Data Reviewed:    Family Communication: no family at the bedside   Disposition: Status is: Inpatient Remains inpatient appropriate because: telemetry monitoring   Planned Discharge Destination: Home     Author: Elidia Toribio Furnace, MD 11/30/2023 9:57 AM  For on call review [email or web address].

## 2023-11-30 NOTE — Progress Notes (Signed)
 There was order for placing a PIV access, but patient has new PIV access today. Patient's RN stated that it was infiltrated, Assessed PIV access which was good blood return. Point out swollen 3 inches up from insertion site. It didn't look related to PIV access. Informed Sarah RN regarding this matter. HS [employer]

## 2023-12-01 DIAGNOSIS — I4819 Other persistent atrial fibrillation: Secondary | ICD-10-CM | POA: Diagnosis not present

## 2023-12-01 DIAGNOSIS — I5033 Acute on chronic diastolic (congestive) heart failure: Secondary | ICD-10-CM | POA: Diagnosis not present

## 2023-12-01 DIAGNOSIS — I10 Essential (primary) hypertension: Secondary | ICD-10-CM | POA: Diagnosis not present

## 2023-12-01 DIAGNOSIS — E876 Hypokalemia: Secondary | ICD-10-CM | POA: Diagnosis not present

## 2023-12-01 LAB — BASIC METABOLIC PANEL WITH GFR
Anion gap: 7 (ref 5–15)
BUN: 19 mg/dL (ref 8–23)
CO2: 27 mmol/L (ref 22–32)
Calcium: 9 mg/dL (ref 8.9–10.3)
Chloride: 100 mmol/L (ref 98–111)
Creatinine, Ser: 0.97 mg/dL (ref 0.44–1.00)
GFR, Estimated: >60 mL/min (ref >60)
Glucose, Bld: 158 mg/dL — ABNORMAL HIGH (ref 70–99)
Potassium: 4 mmol/L (ref 3.5–5.1)
Sodium: 134 mmol/L — ABNORMAL LOW (ref 135–145)

## 2023-12-01 LAB — MAGNESIUM: Magnesium: 1.7 mg/dL (ref 1.7–2.4)

## 2023-12-01 LAB — GLUCOSE, CAPILLARY
Glucose-Capillary: 152 mg/dL — ABNORMAL HIGH (ref 70–99)
Glucose-Capillary: 200 mg/dL — ABNORMAL HIGH (ref 70–99)

## 2023-12-01 MED ORDER — FUROSEMIDE 20 MG PO TABS: 60.0000 mg | ORAL_TABLET | Freq: Every day | ORAL | 0 refills | Status: AC

## 2023-12-01 MED ORDER — SPIRONOLACTONE 25 MG PO TABS
12.5000 mg | ORAL_TABLET | Freq: Every day | ORAL | 0 refills | Status: AC
Start: 2023-12-01 — End: ?

## 2023-12-01 MED ORDER — DILTIAZEM HCL ER 60 MG PO CP12: 60.0000 mg | ORAL_CAPSULE | Freq: Two times a day (BID) | ORAL | Status: DC

## 2023-12-01 MED ORDER — DILTIAZEM HCL ER 60 MG PO CP12
60.0000 mg | ORAL_CAPSULE | Freq: Two times a day (BID) | ORAL | 0 refills | Status: AC
Start: 2023-12-02 — End: ?

## 2023-12-01 NOTE — Discharge Summary (Addendum)
 Physician Discharge Summary   Patient: Tracie [name] MRN: [id number] DOB: [birth year]  Admit date:     11/23/2023  Discharge date: 12/01/23  Discharge Physician: Elidia Sieving Eleftheria Taborn   PCP: Rolinda Millman, MD   Recommendations at discharge:    Patient was placed on furosemide  60 mg po daily for diuresis.  Added diltiazem  for rate control atrial fibrillation Continue guideline directed medical therapy for heart failure with spironolactone , metoprolol  succinate, SGLT inh and losartan . Follow up renal function and electrolytes in 7 days as outpatient Follow up with Dr Rolinda in 7 to 10 days Follow up with Cardiology as scheduled.  Discharge Diagnoses: Principal Problem:   Acute on chronic diastolic CHF (congestive heart failure) (HCC) Active Problems:   Persistent atrial fibrillation (HCC)   Essential hypertension, benign   Hypokalemia   Type 2 diabetes mellitus (HCC)   Lumbar burst fracture (HCC)   Obesity, class 3  Resolved Problems:   * No resolved hospital problems. [facility] Hospital Course: Tracie [name] was admitted to the hospital with the working diagnosis of heart failure exacerbation.   62/F with type 2 diabetes, hypertension, history of DVT, paroxysmal A-fib, chronic diastolic CHF, chronic back pain who presented with dyspnea. Recent hospitalization 10/02/23 for 10/17/23, for heart failure exacerbation. She was diuresed 19,389 ml and discharged home with instructions to take torsemide  40 mg po daily. She was placed on guideline directed medical therapy for heart failure.  She noticed edema 3 weeks after discharge, reports initial compliance with torsemide , then developed a rash/ in her lower legs, called her PCP who advised her to hold torsemide , subsequently had worsening edema while off diuretics, prompting her to come back to the ED.  On her initial physical examination her blood pressure was  160/62, HR 57, RR 24 and 02 saturation 100%  Lungs with decreased breath  sounds at bases, heart with S1 and S2 present irregularly irregular with no gallops, rubs or murmurs, abdomen protuberant but non distended, positive lower extremity edema.   Na 138, K 4.5 Cl 107 bicarbonate 23 glucose 151 bun 13 cr 0,88  BNP 290  High sensitive troponin 5 and 5  Wbc 6.8 hgb 13.8 plt 241   Chest radiograph with right rotation, positive cardiomegaly, with bilateral hilar vascular congestion, no effusions or infiltrates.   EKG 100 bpm, normal axis, normal intervals, qtc 448, atrial flutter with variable block, no significant ST segment or T wave changes.   08/21 responding well to diuresis, continue to have atrial fibrillation with RVR.  08/22 improved rate control atrial fibrillation, starting long acting diltiazem .  08/23 better controlled atrial fibrillation, resumed furosemide  with good toleration.  08/24 patient medical stable for discharge home.   Assessment and Plan: * Acute on chronic diastolic CHF (congestive heart failure) (HCC) Echocardiogram with preserved LV systolic function with EF 60 to 65%, mild LVH, RV systolic function preserved, LA and RA with moderate dilatation, no significant valvular disease.  Patient was placed on IV furosemide  for diuresis, negative fluid balance was achieved, -15,322 ml, with significant improvement in her symptoms.   RAAS inhibition with spironolactone  and losartan  B blockade with metoprolol  succinate 100 mg daily.  Patient on furosemide  60 mg daily for diuresis.  At the time of discharge resume losartan  and SGLT 2 inh  Persistent atrial fibrillation (HCC) Improved rate control to 80 bpm, atrial fibrillation.   Plan to continue metoprolol  succinate and long acting diltiazem  60 mg bid. She had dyspepsia with diltiazem  24 hrs tablet.  Continue anticoagulation with apixaban .   Hypokalemia Hypomagnesemia hyponatremia   At the time of discharge her renal function is stable with serum cr at 0.97, K is 4.0 and serum  bicarbonate at 27  Na 134  Mg 1,7   Patient will continue diuresis with furosemide  and spironolactone  Follow up renal function and electrolytes as outpatient.   Essential hypertension, benign Continue blood pressure control with losartan  and metoprolol .   Type 2 diabetes mellitus (HCC) \Patient was placed on insulin  sliding scale for  glucose cover and monitoring  She had mild hyperglycemia, low 200's during her hospitalization.  Lumbar burst fracture (HCC) Continue pain control with oxycodone  as needed 10 mg every 6 hrs.  Follow up as outpatient.   Obesity, class 3 Calculated BMI is 44.5    Consultants: none  Procedures performed: none   Disposition: Home Diet recommendation:  Cardiac and Carb modified diet DISCHARGE MEDICATION: Allergies as of 12/01/2023       Reactions   Codeine Anaphylaxis   Throat swelling per [facility] from [facility]   Crestor [rosuvastatin] Other (See Comments)   Myalgias  Weakness   Diltiazem     Chest and throat burning   Metoprolol  Tartrate    Severe shoulder pain        Medication List     STOP taking these medications    torsemide  20 MG tablet Commonly known as: DEMADEX        TAKE these medications    acetaminophen  325 MG tablet Commonly known as: TYLENOL  Take 2 tablets (650 mg total) by mouth every 6 (six) hours as needed for mild pain (pain score 1-3) or moderate pain (pain score 4-6).   diltiazem  60 MG 12 hr capsule Commonly known as: CARDIZEM  SR Take 1 capsule (60 mg total) by mouth every 12 (twelve) hours. Start taking on: December 02, 2023   Eliquis  5 MG Tabs tablet Generic drug: apixaban  Take 1 tablet (5 mg total) by mouth 2 (two) times daily.   empagliflozin  25 MG Tabs tablet Commonly known as: JARDIANCE  Take 25 mg by mouth daily.   furosemide  20 MG tablet Commonly known as: LASIX  Take 3 tablets (60 mg total) by mouth daily.   gabapentin  100 MG capsule Commonly known as: NEURONTIN  Take 2 capsules (200  mg total) by mouth 2 (two) times daily.   glipiZIDE  2.5 MG Tabs Take 2.5 mg by mouth daily. What changed: how much to take   lactulose  10 GM/15ML solution Commonly known as: CHRONULAC  Take 30 mLs (20 g total) by mouth daily. What changed:  when to take this reasons to take this   losartan  50 MG tablet Commonly known as: COZAAR  Take 1 tablet (50 mg total) by mouth daily.   methocarbamol  750 MG tablet Commonly known as: ROBAXIN  Take 1 tablet (750 mg total) by mouth every 8 (eight) hours as needed for muscle spasms.   metoprolol  succinate 100 MG 24 hr tablet Commonly known as: TOPROL -XL Take 100 mg by mouth daily. Take with or immediately following a meal.   Oxycodone  HCl 10 MG Tabs Take 1 tablet (10 mg total) by mouth every 6 (six) hours as needed for severe pain (pain score 7-10).   potassium chloride  SA 20 MEQ tablet Commonly known as: KLOR-CON  M Take 20 mEq by mouth daily.   spironolactone  25 MG tablet Commonly known as: ALDACTONE  Take 0.5 tablets (12.5 mg total) by mouth daily.   vitamin B-12 500 MCG tablet Commonly known as: CYANOCOBALAMIN  Take 500 mcg by mouth daily.  Durable Medical Equipment  (From admission, onward)           Start     Ordered   11/27/23 1602  For home use only DME standard manual wheelchair with seat cushion  Once       Comments: Patient suffers from right hip pain that limits her standing/gait tolerance, Acute on chronic CHF, lumbar burst fracture,  which impairs their ability to perform daily activities like ambulating  in the home.  A cane  will not resolve issue with performing activities of daily living. A wheelchair will allow patient to safely perform daily activities. Patient can safely propel the wheelchair in the home or has a caregiver who can provide assistance. Length of need lifetime . Accessories: removal elevating leg rests (ELRs), wheel locks, extensions and anti-tippers.  Seat and back cushions    Bariatric   Ht 5'10, wt 140.8 kg   11/27/23 1603            Follow-up Information     [facility] Follow up.   Specialty: Home Health Services Contact information: [address] [place] [address] [phone]         [facility]. Go in 4 day(s).   Specialty: Cardiology Why: Hospital follow up 12/02/2023 @ 8:15 am PLEASE bring a current medication list to appointment FREE valet parking, Entrance C, off [address] and [facility] entrance. Contact information: [address] [place]  [address] [phone]        [facility] Follow up.   Why: [facility] , call for delivery of wheelchair Contact information: [address] [facility] [place] [address] [phone]                Discharge Exam: Filed Weights   11/29/23 0354 11/30/23 0628 12/01/23 0340  Weight: (!) 139.6 kg (!) 139.3 kg (!) 139.8 kg   BP 123/87 (BP Location: Left Arm)   Pulse 85   Temp 98.1 F (36.7 C) (Oral)   Resp 13   Ht 5' 10 (1.778 m)   Wt (!) 139.8 kg   SpO2 93%   BMI 44.22 kg/m   Patient is feeling better, dyspnea and edema have improved, no chest pain.   Neurology awake and alert ENT with mild pallor Cardiovascular with S1 and S2 present, irregularly irregular with no gallops or rubs Respiratory with no rales or wheezing, no rhonchi  Abdomen protuberant, but soft and not distended No lower extremity edema    Condition at discharge: stable  The results of significant diagnostics from this hospitalization (including imaging, microbiology, ancillary and laboratory) are listed below for reference.   Imaging Studies: DG Chest Port 1 View Result Date: 11/23/2023 CLINICAL DATA:  Shortness of breath EXAM: PORTABLE CHEST 1 VIEW COMPARISON:  10/02/2023 FINDINGS: Cardiomegaly with mild central congestion. No focal opacity, pleural effusion, or  pneumothorax. Possible small calcified loose bodies at the shoulder. Moderate degenerative changes of the shoulders. IMPRESSION: Cardiomegaly with mild central congestion. Electronically Signed   By: Luke Bun M.D.   On: 11/23/2023 23:17    Microbiology: Results for orders placed or performed during the hospital encounter of 11/23/23  MRSA Next Gen by PCR, Nasal     Status: None   Collection Time: 11/24/23  7:13 AM   Specimen: Nasal Mucosa; Nasal Swab  Result Value Ref Range Status   MRSA by PCR Next Gen NOT DETECTED NOT DETECTED  Final    Comment: (NOTE) The GeneXpert MRSA Assay (FDA approved for NASAL specimens only), is one component of a comprehensive MRSA colonization surveillance program. It is not intended to diagnose MRSA infection nor to guide or monitor treatment for MRSA infections. Test performance is not FDA approved in patients less than 71 years old. Performed at [facility], [address], [place], [place] [address]     Labs: CBC: No results for input(s): WBC, NEUTROABS, HGB, HCT, MCV, PLT in the last 168 hours. Basic Metabolic Panel: Recent Labs  Lab 11/27/23 0224 11/28/23 0223 11/29/23 0235 11/30/23 0212 12/01/23 0252  NA 138 134* 134* 132* 134*  K 2.7* 3.3* 3.7 3.7 4.0  CL 95* 95* 97* 98 100  CO2 30 27 29 27 27   GLUCOSE 168* 161* 165* 188* 158*  BUN 16 15 17 19 19   CREATININE 1.03* 0.96 1.14* 1.04* 0.97  CALCIUM 8.8* 8.5* 9.1 9.0 9.0  MG 1.5* 1.8 2.0 1.7 1.7   Liver Function Tests: No results for input(s): AST, ALT, ALKPHOS, BILITOT, PROT, ALBUMIN in the last 168 hours. CBG: Recent Labs  Lab 11/30/23 0558 11/30/23 1108 11/30/23 1555 11/30/23 2113 12/01/23 0619  GLUCAP 171* 161* 208* 204* 152*    Discharge time spent: greater than 30 minutes.  Signed: Elidia Toribio Furnace, MD [facility] 12/01/2023

## 2023-12-01 NOTE — Plan of Care (Signed)

## 2023-12-01 NOTE — Progress Notes (Signed)
 Mobility Specialist Progress Note:   12/01/23 0910  Mobility  Activity Ambulated with assistance  Level of Assistance Contact guard assist, steadying assist  Assistive Device Front wheel walker  Distance Ambulated (ft) 70 ft  Activity Response Tolerated well  Mobility Referral Yes  Mobility visit 1 Mobility  Mobility Specialist Start Time (ACUTE ONLY) 0910  Mobility Specialist Stop Time (ACUTE ONLY) 0920  Mobility Specialist Time Calculation (min) (ACUTE ONLY) 10 min   Pt agreeable to mobility session. Required only minG assist to ambulate with RW in hallway. HR up to 171bpm with exertion. Recovered quickly with rest. Pt back in bed with all needs met.   Therisa Rana Mobility Specialist Please contact via SecureChat or  Rehab office at [phone]

## 2023-12-01 NOTE — TOC Transition Note (Addendum)
 Transition of Care [facility]) - Discharge Note   Patient Details  Name: Tracie [name] MRN: [id number] Date of Birth: [birth year]  Transition of Care [facility]) CM/SW Contact:  Robynn Eileen Hoose, RN Phone Number: 12/01/2023, 1:59 PM   Clinical Narrative:   Patient is being discharged today. Cory with [facility] made aware. Message left for Mitch and Ada, with [employer] regarding patient being discharged.  1416: Floor nurse reports patient needing bariatric bedside commode. DME ordered through Ada with [employer] to be delivered to the patient home.   Final next level of care: Home w Home Health Services Barriers to Discharge: No Barriers Identified   Patient Goals and CMS Choice Patient states their goals for this hospitalization and ongoing recovery are:: to return to home CMS Medicare.gov Compare Post Acute Care list provided to:: Patient Choice offered to / list presented to : Patient, Spouse      Discharge Placement                       Discharge Plan and Services Additional resources added to the After Visit Summary for     Discharge Planning Services: CM Consult Post Acute Care Choice: Home Health          DME Arranged: N/A DME Agency: NA       HH Arranged: RN, PT HH Agency: [facility] Date [facility] Agency Contacted: 11/26/23 Time HH Agency Contacted: 1415 Representative spoke with at [facility] Agency: Darleene  Social Drivers of Health (SDOH) Interventions SDOH Screenings   Food Insecurity: No Food Insecurity (11/25/2023)  Housing: Unknown (11/26/2023)  Transportation Needs: No Transportation Needs (11/26/2023)  Utilities: Not At Risk (11/25/2023)  Alcohol Screen: Low Risk  (11/26/2023)  Depression (PHQ2-9): Low Risk  (02/26/2020)  Financial Resource Strain: Low Risk  (11/26/2023)  Social Connections: Moderately Isolated (11/25/2023)  Tobacco Use: Medium Risk (11/23/2023)     Readmission Risk Interventions    10/08/2023    2:56 PM  Readmission Risk Prevention Plan  Post  Dischage Appt Complete  Medication Screening Complete  Transportation Screening Complete

## 2023-12-01 NOTE — Care Management (Signed)
    Durable Medical Equipment  (From admission, onward)           Start     Ordered   12/01/23 1416  For home use only DME Bedside commode  Once       Comments: Bariatric. Pt is confined to room with no bathroom. Pt is 307.6 lbs  Question:  Patient needs a bedside commode to treat with the following condition  Answer:  Weakness   12/01/23 1416   11/27/23 1602  For home use only DME standard manual wheelchair with seat cushion  Once       Comments: Patient suffers from right hip pain that limits her standing/gait tolerance, Acute on chronic CHF, lumbar burst fracture,  which impairs their ability to perform daily activities like ambulating  in the home.  A cane  will not resolve issue with performing activities of daily living. A wheelchair will allow patient to safely perform daily activities. Patient can safely propel the wheelchair in the home or has a caregiver who can provide assistance. Length of need lifetime . Accessories: removal elevating leg rests (ELRs), wheel locks, extensions and anti-tippers.  Seat and back cushions   Bariatric   Ht 5'10, wt 140.8 kg   11/27/23 1603

## 2023-12-02 ENCOUNTER — Ambulatory Visit (HOSPITAL_COMMUNITY)
Admit: 2023-12-02 | Discharge: 2023-12-02 | Disposition: A | Discharge status: Home / Self Care | Source: Ambulatory Visit | Attending: Physician Assistant | Admitting: Physician Assistant

## 2023-12-02 ENCOUNTER — Encounter (HOSPITAL_COMMUNITY): Payer: Self-pay

## 2023-12-02 VITALS — BP 130/80 | HR 113 | Ht 70.0 in | Wt 307.0 lb

## 2023-12-02 DIAGNOSIS — I4821 Permanent atrial fibrillation: Secondary | ICD-10-CM | POA: Insufficient documentation

## 2023-12-02 DIAGNOSIS — I110 Hypertensive heart disease with heart failure: Secondary | ICD-10-CM | POA: Diagnosis not present

## 2023-12-02 DIAGNOSIS — Z79899 Other long term (current) drug therapy: Secondary | ICD-10-CM | POA: Insufficient documentation

## 2023-12-02 DIAGNOSIS — E119 Type 2 diabetes mellitus without complications: Secondary | ICD-10-CM | POA: Insufficient documentation

## 2023-12-02 DIAGNOSIS — I5032 Chronic diastolic (congestive) heart failure: Secondary | ICD-10-CM | POA: Insufficient documentation

## 2023-12-02 DIAGNOSIS — Z86718 Personal history of other venous thrombosis and embolism: Secondary | ICD-10-CM | POA: Insufficient documentation

## 2023-12-02 DIAGNOSIS — Z6841 Body Mass Index (BMI) 40.0 and over, adult: Secondary | ICD-10-CM | POA: Diagnosis not present

## 2023-12-02 DIAGNOSIS — Z7984 Long term (current) use of oral hypoglycemic drugs: Secondary | ICD-10-CM | POA: Insufficient documentation

## 2023-12-02 DIAGNOSIS — Z7901 Long term (current) use of anticoagulants: Secondary | ICD-10-CM | POA: Diagnosis not present

## 2023-12-02 DIAGNOSIS — Z87891 Personal history of nicotine dependence: Secondary | ICD-10-CM | POA: Insufficient documentation

## 2023-12-02 NOTE — Patient Instructions (Addendum)
 Good to see you today!  Follow up  with cardiology Dr. Wonda on  September 22 @8 :20 Please keep this appointment   Please pick up medications at the pharmacy

## 2023-12-03 DIAGNOSIS — M170 Bilateral primary osteoarthritis of knee: Secondary | ICD-10-CM | POA: Diagnosis not present

## 2023-12-03 DIAGNOSIS — I480 Paroxysmal atrial fibrillation: Secondary | ICD-10-CM | POA: Diagnosis not present

## 2023-12-03 DIAGNOSIS — E118 Type 2 diabetes mellitus with unspecified complications: Secondary | ICD-10-CM | POA: Diagnosis not present

## 2023-12-03 DIAGNOSIS — I5033 Acute on chronic diastolic (congestive) heart failure: Secondary | ICD-10-CM | POA: Diagnosis not present

## 2023-12-03 DIAGNOSIS — E878 Other disorders of electrolyte and fluid balance, not elsewhere classified: Secondary | ICD-10-CM | POA: Diagnosis not present

## 2023-12-03 DIAGNOSIS — Z9289 Personal history of other medical treatment: Secondary | ICD-10-CM | POA: Diagnosis not present

## 2023-12-03 DIAGNOSIS — E538 Deficiency of other specified B group vitamins: Secondary | ICD-10-CM | POA: Diagnosis not present

## 2023-12-03 DIAGNOSIS — M1611 Unilateral primary osteoarthritis, right hip: Secondary | ICD-10-CM | POA: Diagnosis not present

## 2023-12-07 DIAGNOSIS — E538 Deficiency of other specified B group vitamins: Secondary | ICD-10-CM | POA: Diagnosis not present

## 2023-12-07 DIAGNOSIS — E871 Hypo-osmolality and hyponatremia: Secondary | ICD-10-CM | POA: Diagnosis not present

## 2023-12-07 DIAGNOSIS — Z87891 Personal history of nicotine dependence: Secondary | ICD-10-CM | POA: Diagnosis not present

## 2023-12-07 DIAGNOSIS — E119 Type 2 diabetes mellitus without complications: Secondary | ICD-10-CM | POA: Diagnosis not present

## 2023-12-07 DIAGNOSIS — Z6841 Body Mass Index (BMI) 40.0 and over, adult: Secondary | ICD-10-CM | POA: Diagnosis not present

## 2023-12-07 DIAGNOSIS — I4819 Other persistent atrial fibrillation: Secondary | ICD-10-CM | POA: Diagnosis not present

## 2023-12-07 DIAGNOSIS — E876 Hypokalemia: Secondary | ICD-10-CM | POA: Diagnosis not present

## 2023-12-07 DIAGNOSIS — E66813 Obesity, class 3: Secondary | ICD-10-CM | POA: Diagnosis not present

## 2023-12-07 DIAGNOSIS — Z7901 Long term (current) use of anticoagulants: Secondary | ICD-10-CM | POA: Diagnosis not present

## 2023-12-07 DIAGNOSIS — Z86718 Personal history of other venous thrombosis and embolism: Secondary | ICD-10-CM | POA: Diagnosis not present

## 2023-12-07 DIAGNOSIS — G8929 Other chronic pain: Secondary | ICD-10-CM | POA: Diagnosis not present

## 2023-12-07 DIAGNOSIS — Z9181 History of falling: Secondary | ICD-10-CM | POA: Diagnosis not present

## 2023-12-07 DIAGNOSIS — S32001D Stable burst fracture of unspecified lumbar vertebra, subsequent encounter for fracture with routine healing: Secondary | ICD-10-CM | POA: Diagnosis not present

## 2023-12-07 DIAGNOSIS — I5033 Acute on chronic diastolic (congestive) heart failure: Secondary | ICD-10-CM | POA: Diagnosis not present

## 2023-12-07 DIAGNOSIS — I110 Hypertensive heart disease with heart failure: Secondary | ICD-10-CM | POA: Diagnosis not present

## 2023-12-07 DIAGNOSIS — M1611 Unilateral primary osteoarthritis, right hip: Secondary | ICD-10-CM | POA: Diagnosis not present

## 2023-12-07 DIAGNOSIS — M170 Bilateral primary osteoarthritis of knee: Secondary | ICD-10-CM | POA: Diagnosis not present

## 2023-12-07 DIAGNOSIS — Z7984 Long term (current) use of oral hypoglycemic drugs: Secondary | ICD-10-CM | POA: Diagnosis not present

## 2023-12-18 ENCOUNTER — Other Ambulatory Visit (INDEPENDENT_AMBULATORY_CARE_PROVIDER_SITE_OTHER): Payer: Self-pay

## 2023-12-18 ENCOUNTER — Ambulatory Visit: Admitting: Orthopedic Surgery

## 2023-12-18 VITALS — BP 143/74 | HR 111 | Ht 70.0 in | Wt 307.0 lb

## 2023-12-18 DIAGNOSIS — M545 Low back pain, unspecified: Secondary | ICD-10-CM

## 2023-12-18 DIAGNOSIS — M810 Age-related osteoporosis without current pathological fracture: Secondary | ICD-10-CM | POA: Diagnosis not present

## 2023-12-18 NOTE — Progress Notes (Addendum)
 Orthopedic Spine Surgery Office Note  Assessment: Patient is a 63 y.o. female with three issues:  Osteoporosis Chronic back pain Chronic bilateral hip pain   Plan: -Patient is a poor operative candidate with a cardiac history, morbid obesity, and osteoporosis.  There would be a larger surgery to correct her focal kyphosis with increased risk of complication especially given her history.  Patient's would need to be optimized prior to to a larger spine surgery.  I do not feel that the benefits of surgical intervention would outweigh the risks.  For that reason, I recommended pain management.  Referral provided to her today.  Also provided her with a referral to the osteoporosis clinic to try to decrease her risk of future osteoporosis related fractures.  We talked about injections to the hip for her hip arthritis but she is not interested in that.  Splane that she is not a candidate for THA with her morbid obesity -Patient should return to office on as-needed basis   Patient expressed understanding of the plan and all questions were answered to the patient's satisfaction.   ___________________________________________________________________________   History:  Patient is a 63 y.o. female who presents today for lumbar spine.  Patient has a history of back pain but noticed worsening of her pain in November 2024 after bending her back around that time. She felt that her back gave out on her.  She was diagnosed with a compression fracture.  She underwent a kyphoplasty with Dr. Dolphus on 03/04/2023.  It is unclear if she was seeing him after this but sounds like she did not get any significant relief with this procedure.  She has had persistent back pain since that time.  She has been taking oxycodone  to control the pain.  She says that helps with her back pain.  However, she also has bilateral hip pain.  She notes the pain when she is walking and she does not have it when she is sitting.  She uses  a wheelchair for longer distances to help with the pain.  She does ambulate around her house.  She said the oxycodone  does not help with the hip pain is much as it does for her back pain.  She has no pain radiating past either hip.   Weakness: Yes, her right hip feels weaker.  No other weakness noted Symptoms of imbalance: Denies Paresthesias and numbness: Denies Bowel or bladder incontinence: Denies Saddle anesthesia: Denies  Treatments tried: PT, tylenol , gabapentin , robaxin , oxycodone   Review of systems: Denies fevers and chills, night sweats, unexplained weight loss, history of cancer.  Has had pain that wakes her at night.  Past medical history: HTN GERD Atrial fibrillation DM HLD History of DVT  Allergies: codeine, crestor, diltiazem , metoprolol   Past surgical history:  Kyphoplasty Hysterectomy  Social history: Denies use of nicotine product (smoking, vaping, patches, smokeless) Alcohol use: denies  Denies recreational drug use   Physical Exam:  BMI of 44.0  General: no acute distress, appears stated age Neurologic: alert, answering questions appropriately, following commands Respiratory: unlabored breathing on room air, symmetric chest rise Psychiatric: appropriate affect, normal cadence to speech   MSK (spine):  -Strength exam      Left  Right EHL    5/5  5/5 TA    5/5  5/5 GSC    5/5  5/5 Knee extension  5/5  5/5 Hip flexion   5/5  4/5  -Sensory exam    Sensation intact to light touch in L3-S1 nerve distributions of  bilateral lower extremities  -Straight leg raise: negative bilaterally  -Clonus: no beats bilaterally  -Left hip exam: pain with range of motion outside 30 degree arc, positive FADIR, positive stinchfield -Right hip exam: pain with range of motion outside 30 degree arc, positive FADIR, positive stinchfield  Imaging: XRs of the lumbar spine from 12/18/2023 were independently reviewed and interpreted, showing burst fracture at L1  with cement augmentation of the vertebral body anteriorly.  There appears to be a chronic superior endplate fracture of L2 as well.  No other fracture seen.  No dislocation seen.  Disc height loss at L4/5.  No other significant degenerative changes.  There is focal kyphosis at the level of the L1 vertebra  MRI of the lumbar spine from 10/14/2023 was independently reviewed and interpreted, showing chronic burst fracture at L1 with cement augmentation of the anterior aspect of the vertebra.  Hemangioma in the T11 vertebral body.  There is no significant central or lateral recess stenosis.  Mild left-sided foraminal stenosis at L1/2.  DDD at L4/5.   Patient name: Tracie [name] Patient MRN: [id number] Date of visit: 12/18/23

## 2023-12-30 ENCOUNTER — Ambulatory Visit: Admitting: Cardiovascular Disease

## 2024-01-06 ENCOUNTER — Telehealth: Payer: Self-pay | Admitting: Orthopedic Surgery

## 2024-01-06 ENCOUNTER — Ambulatory Visit: Admitting: Physician Assistant

## 2024-01-06 ENCOUNTER — Encounter: Payer: Self-pay | Admitting: Physician Assistant

## 2024-01-06 VITALS — Ht 70.0 in | Wt 307.0 lb

## 2024-01-06 DIAGNOSIS — M545 Low back pain, unspecified: Secondary | ICD-10-CM

## 2024-01-06 DIAGNOSIS — M810 Age-related osteoporosis without current pathological fracture: Secondary | ICD-10-CM | POA: Insufficient documentation

## 2024-01-06 NOTE — Telephone Encounter (Signed)
 Pt was checking out and inquired about her pain management referral.  She stated that she was given the number and has called them on several occasions and has yet to hear anything back from them.  She is aware that it could take up to 3 weeks before they get in touch with her which will be on January 08, 2024 but she wanted a message to be sent back to let Dr. Georgina know that she has been calling them but no reply back from them.  She would like to know what her next step should be.  Patient would like to have a call.

## 2024-01-06 NOTE — Addendum Note (Signed)
 Addended by: RODGERS LACY on: 01/06/2024 04:40 PM   Modules accepted: Orders

## 2024-01-06 NOTE — Progress Notes (Signed)
 Office Visit Note   Patient: Tracie [name]           Date of Birth: 1960/08/17           MRN: [id number] Visit Date: 01/06/2024              Requested by: Georgina Ozell LABOR, MD [address] [place],  [place] [address] PCP: Rolinda Millman, MD   Assessment & Plan: Visit Diagnoses:  1. Age-related osteoporosis without current pathological fracture     Plan: [name] is a pleasant 63 year old woman who is referred from Dr. Georgina for evaluation of osteoporosis.  She does have a history of compression fractures in her spine.  She does not currently take any osteoporosis medications.  She has never had any other fractures beside the spine fracture.  No history of cardiac disease cancers or kidney disease.  She does not have any ulcers gastric by Sass or severe reflux.  She does not have a history of epilepsy.  She did go through menopause with a hysterectomy at 30 and did not take hormone replacement she does not take any particular calcium or vitamin D but reports drinking milk intake eating cheese and yogurt.  She is a former smoker.  She does not take alcoholic beverages and does not exercise.  She has no issues with her dentition and no family history of osteoporotic fractures.  Unfortunately because of her pain in her back and arthritis she has mostly wheelchair-bound.  She just states that she is too painful for her to walk very much although she walks minimally at home.  This obviously contributes to her osteoporosis.  Her current height and weight she is 5 foot 10 and weighs 307 pounds.  We had a long talk about options with her.  She says that she could not do a bone density scan because she would have to be remain laying down she which she cannot do.  We will complete her lab work with a vitamin D and PTH and TSH today.  If those are all normal could be started on Fosamax which she is interested in doing she does not want to do any injectables at all.  I spent 45 minutes with her discussing  lifestyle changes and medication options.  We all review her lab work and could consider prescription for Fosamax talked her about getting involved in some type of chair exercise program.  Also talked to her about getting involved with tracking her calcium and vitamin D  Follow-Up Instructions: Return if symptoms worsen or fail to improve.   Orders:  No orders of the defined types were placed in this encounter.  No orders of the defined types were placed in this encounter.     Procedures: No procedures performed   Clinical Data: No additional findings.   Subjective: No chief complaint on file.   HPI patient is a pleasant 63 year old woman referred for evaluation of osteoporosis by Dr. Georgina.  Review of Systems  All other systems reviewed and are negative.    Objective: Vital Signs: Ht 5' 10 (1.778 m)   Wt (!) 307 lb (139.3 kg)   BMI 44.05 kg/m   Physical Exam Constitutional:      Appearance: Normal appearance.  Pulmonary:     Effort: Pulmonary effort is normal.  Neurological:     General: No focal deficit present.     Mental Status: She is alert and oriented to person, place, and time.  Psychiatric:  Mood and Affect: Mood normal.        Behavior: Behavior normal.       Specialty Comments:  No specialty comments available.  Imaging: No results found.   PMFS History: Patient Active Problem List   Diagnosis Date Noted   Age-related osteoporosis without current pathological fracture 01/06/2024   CHF (congestive heart failure) (HCC) 11/24/2023   Obesity, class 3 10/16/2023   Chronic a-fib (HCC) 10/09/2023   Permanent atrial fibrillation (HCC) 10/08/2023   Acute on chronic heart failure with preserved ejection fraction (HFpEF) (HCC) 10/03/2023   SOB (shortness of breath) 10/03/2023   Persistent atrial fibrillation (HCC) 10/03/2023   Lumbar burst fracture (HCC) 10/03/2023   Acute on chronic diastolic CHF (congestive heart failure) (HCC)  10/03/2023   Atrial fibrillation with rapid ventricular response (HCC) 02/24/2023   Type 2 diabetes mellitus (HCC) 02/24/2023   Lumbar pain 02/24/2023   Leukocytosis 02/24/2023   Hypokalemia 02/24/2023   Myalgia 10/27/2018   Arthralgia of knee, right 10/27/2018   Varicose veins of both lower extremities with pain 07/16/2018   Hyperlipidemia 06/03/2018   Essential hypertension, benign 06/03/2018   Past Medical History:  Diagnosis Date   Atrial fibrillation (HCC)    Celiac artery dissection    Diabetes mellitus (HCC)    History of DVT (deep vein thrombosis) 2010   Hyperlipidemia    Hypertension    Morbid obesity (HCC)     Family History  Problem Relation Age of Onset   Hypertension Mother    Heart attack Father    Heart disease Brother    Healthy Son    Breast cancer Neg Hx     Past Surgical History:  Procedure Laterality Date   ABDOMINAL HYSTERECTOMY     FRACTURE SURGERY Left    hand   IR KYPHO LUMBAR INC FX REDUCE BONE BX UNI/BIL CANNULATION INC/IMAGING  03/04/2023   KNEE ARTHROSCOPY Right 1990s   Social History   Occupational History   Occupation: Disability  Tobacco Use   Smoking status: Former    Current packs/day: 0.00    Average packs/day: 1 pack/day for 20.0 years (20.0 ttl pk-yrs)    Types: Cigarettes    Start date: 51    Quit date: 2010    Years since quitting: 15.7   Smokeless tobacco: Never  Vaping Use   Vaping status: Never Used  Substance and Sexual Activity   Alcohol use: Never   Drug use: Not Currently   Sexual activity: Yes

## 2024-01-08 LAB — VITAMIN D 25 HYDROXY (VIT D DEFICIENCY, FRACTURES): Vit D, 25-Hydroxy: 38 ng/mL (ref 30–100)

## 2024-01-08 LAB — PARATHYROID HORMONE, INTACT (NO CA)

## 2024-01-08 LAB — TSH: TSH: 3.79 m[IU]/L (ref 0.40–4.50)

## 2024-01-10 ENCOUNTER — Telehealth: Payer: Self-pay

## 2024-01-10 NOTE — Telephone Encounter (Signed)
 Patient called back concerning lab results.  CB# [phone].  Please advise. Thank you

## 2024-01-10 NOTE — Telephone Encounter (Signed)
 FYI- Patient was returning your call. Aware that you will be calling on Monday, 01/13/2024.

## 2024-01-13 ENCOUNTER — Encounter

## 2024-01-14 ENCOUNTER — Encounter

## 2024-01-30 DIAGNOSIS — S32000D Wedge compression fracture of unspecified lumbar vertebra, subsequent encounter for fracture with routine healing: Secondary | ICD-10-CM | POA: Diagnosis not present

## 2024-01-30 DIAGNOSIS — R269 Unspecified abnormalities of gait and mobility: Secondary | ICD-10-CM | POA: Diagnosis not present

## 2024-01-30 DIAGNOSIS — Z7409 Other reduced mobility: Secondary | ICD-10-CM | POA: Diagnosis not present

## 2024-01-30 DIAGNOSIS — M545 Low back pain, unspecified: Secondary | ICD-10-CM | POA: Diagnosis not present

## 2024-02-03 ENCOUNTER — Ambulatory Visit

## 2024-02-03 VITALS — BP 144/87 | HR 60 | Resp 20 | Ht 70.0 in | Wt 307.0 lb

## 2024-02-03 DIAGNOSIS — I7121 Aneurysm of the ascending aorta, without rupture: Secondary | ICD-10-CM | POA: Diagnosis not present

## 2024-02-03 NOTE — Patient Instructions (Signed)

## 2024-02-03 NOTE — Progress Notes (Signed)
 [address] [place] [address]             [phone]            Tracie [name] [id number] 1960-04-14   History of Present Illness:  Tracie [name] is a 63 year old female with medical history of hypertension, atrial fibrillation, CHF, type 2 diabetes, and hyperlipidemia who presents for initial encounter of ascending thoracic aortic aneurysm.  This was found incidentally when she had a CTA of chest for evaluation of lower leg edema. Aneurysm measured 4.9 cm   She presents to the clinic today with her husband reports that she has been doing well.  Her blood pressure is well-controlled with current medication therapy.  She does not check her blood pressure at home.  She denies exercise and heavy lifting.  No personal or family history of connective tissue disorders.  She states that her father died because of an enlarged heart due to not taking care of himself but no family history of aneurysmal disease.  She denies chest pain, shortness of breath and lower leg swelling.    Current Outpatient Medications on File Prior to Visit  Medication Sig Dispense Refill   acetaminophen  (TYLENOL ) 325 MG tablet Take 2 tablets (650 mg total) by mouth every 6 (six) hours as needed for mild pain (pain score 1-3) or moderate pain (pain score 4-6).     apixaban  (ELIQUIS ) 5 MG TABS tablet Take 1 tablet (5 mg total) by mouth 2 (two) times daily. 60 tablet 0   diltiazem  (CARDIZEM  SR) 60 MG 12 hr capsule Take 1 capsule (60 mg total) by mouth every 12 (twelve) hours. 60 capsule 0   empagliflozin  (JARDIANCE ) 25 MG TABS tablet Take 25 mg by mouth daily.     furosemide  (LASIX ) 20 MG tablet Take 3 tablets (60 mg total) by mouth daily. 90 tablet 0   gabapentin  (NEURONTIN ) 100 MG capsule Take 2 capsules (200 mg total) by mouth 2 (two) times daily. 120 capsule 0   glipiZIDE  2.5 MG TABS Take 2.5 mg by mouth daily. (Patient taking differently: Take 5 mg by mouth daily.) 30 tablet 0    lactulose  (CHRONULAC ) 10 GM/15ML solution Take 30 mLs (20 g total) by mouth daily. (Patient taking differently: Take 20 g by mouth daily as needed for mild constipation.) 946 mL 0   losartan  (COZAAR ) 50 MG tablet Take 1 tablet (50 mg total) by mouth daily. 30 tablet 0   methocarbamol  (ROBAXIN ) 750 MG tablet Take 1 tablet (750 mg total) by mouth every 8 (eight) hours as needed for muscle spasms. 90 tablet 0   metoprolol  succinate (TOPROL -XL) 100 MG 24 hr tablet Take 100 mg by mouth daily. Take with or immediately following a meal.     Oxycodone  HCl 10 MG TABS Take 1 tablet (10 mg total) by mouth every 6 (six) hours as needed for severe pain (pain score 7-10). 30 tablet 0   potassium chloride  SA (KLOR-CON  M) 20 MEQ tablet Take 20 mEq by mouth daily.     spironolactone  (ALDACTONE ) 25 MG tablet Take 0.5 tablets (12.5 mg total) by mouth daily. 30 tablet 0   vitamin B-12 (CYANOCOBALAMIN ) 500 MCG tablet Take 500 mcg by mouth daily.     No current facility-administered medications on file prior to visit.     ROS: Review of Systems  Constitutional:  Negative for malaise/fatigue.  Respiratory:  Negative for cough and  shortness of breath.   Cardiovascular:  Negative for chest pain and leg swelling.     BP (!) 144/87   Pulse 60   Resp 20   Ht 5' 10 (1.778 m)   Wt (!) 307 lb (139.3 kg)   SpO2 98% Comment: RA  BMI 44.05 kg/m   Physical Exam Constitutional:      Appearance: Normal appearance.  HENT:     Head: Normocephalic and atraumatic.  Cardiovascular:     Rate and Rhythm: Rhythm irregularly irregular.     Heart sounds: Normal heart sounds, S1 normal and S2 normal.  Pulmonary:     Effort: Pulmonary effort is normal.     Breath sounds: Normal breath sounds.  Musculoskeletal:     Cervical back: Normal range of motion.  Skin:    General: Skin is warm and dry.  Neurological:     General: No focal deficit present.     Mental Status: She is alert.      Imaging: CLINICAL DATA:   Pulmonary embolus suspected with low to intermediate probability. Negative D-dimer. Leg swelling.   EXAM: CT ANGIOGRAPHY CHEST WITH CONTRAST   TECHNIQUE: Multidetector CT imaging of the chest was performed using the standard protocol during bolus administration of intravenous contrast. Multiplanar CT image reconstructions and MIPs were obtained to evaluate the vascular anatomy.   RADIATION DOSE REDUCTION: This exam was performed according to the departmental dose-optimization program which includes automated exposure control, adjustment of the mA and/or kV according to patient size and/or use of iterative reconstruction technique.   CONTRAST:  75mL OMNIPAQUE  IOHEXOL  350 MG/ML SOLN   COMPARISON:  Chest radiograph 10/02/2023 and 02/24/2023   FINDINGS: Cardiovascular: Technically adequate study with good opacification of the central and segmental pulmonary arteries. No focal filling defects. No evidence of significant pulmonary embolus. Cardiac enlargement. Small pericardial effusions. Ascending aortic aneurysm measuring 4.9 cm diameter. No aortic dissection. Scattered aortic calcification. Scattered coronary artery calcification.   Mediastinum/Nodes: Esophagus is decompressed. No significant lymphadenopathy. Thyroid  gland is unremarkable.   Lungs/Pleura: Motion artifact limits examination. Patchy airspace infiltrates are suggested in the lung bases, possibly edema. Pneumonia less likely. No pleural effusion or pneumothorax.   Upper Abdomen: No acute abnormalities.   Musculoskeletal: Degenerative changes in the spine. Old compression deformity of T12 post kyphoplasty. No acute bony abnormalities.   Review of the MIP images confirms the above findings.   IMPRESSION: 1. No evidence of significant pulmonary embolus. 2. Cardiac enlargement with small pericardial effusion. 3. Patchy airspace changes in the lungs most likely edema. Pneumonia less likely. 4. 4.9 cm diameter  ascending thoracic aortic aneurysm. Ascending thoracic aortic aneurysm. Recommend semi-annual imaging followup by CTA or MRA and referral to cardiothoracic surgery if not already obtained. This recommendation follows 2010 ACCF/AHA/AATS/ACR/ASA/SCA/SCAI/SIR/STS/SVM Guidelines for the Diagnosis and Management of Patients With Thoracic Aortic Disease. Circulation. 2010; 121: Z733-z630. Aortic aneurysm NOS (ICD10-I71.9)     Electronically Signed   By: Elsie Gravely M.D.   On: 10/12/2023 20:24     A/P:  Aneurysm of ascending aorta without rupture -4.9 cm ascending thoracic aortic aneurysm on CTA of chest.  Echocardiogram in 2025 was unable to fully visualize structure of aortic valve.  -We discussed the natural history and and risk factors for growth of ascending aortic aneurysms. Discussed recommendations to minimize the risk of further expansion or dissection including careful blood pressure control, avoidance of contact sports and heavy lifting, attention to lipid management.  We covered the importance of continued smoking cessation.  The patient does not yet meet surgical criteria of >5.5cm. The patient is aware of signs and symptoms of aortic dissection and when to present to the emergency department   -Follow up in 6 months with CTA of chest  Risk Modification:  Statin:  not currently prescribed  Smoking cessation instruction/counseling given:  commended patient for quitting and reviewed strategies for preventing relapses  Patient was counseled on importance of Blood Pressure Control  They are instructed to contact their Primary Care Physician if they start to have blood pressure readings over 130s/90s. Do not ever stop blood pressure medications on your own, unless instructed by healthcare professional.  Please avoid use of Fluoroquinolones as this can potentially increase your risk of Aortic Rupture and/or Dissection  Patient educated on signs and symptoms of Aortic Dissection,  handout also provided in AVS  Manuelita CHRISTELLA Rough, PA-C 02/03/24\

## 2024-02-10 ENCOUNTER — Encounter: Payer: Self-pay | Admitting: Radiology

## 2024-02-17 DIAGNOSIS — M546 Pain in thoracic spine: Secondary | ICD-10-CM | POA: Diagnosis not present

## 2024-02-17 DIAGNOSIS — R0602 Shortness of breath: Secondary | ICD-10-CM | POA: Diagnosis not present

## 2024-02-17 DIAGNOSIS — M25552 Pain in left hip: Secondary | ICD-10-CM | POA: Diagnosis not present

## 2024-02-17 DIAGNOSIS — Z6841 Body Mass Index (BMI) 40.0 and over, adult: Secondary | ICD-10-CM | POA: Diagnosis not present

## 2024-02-17 DIAGNOSIS — Z79899 Other long term (current) drug therapy: Secondary | ICD-10-CM | POA: Diagnosis not present

## 2024-02-17 DIAGNOSIS — M25551 Pain in right hip: Secondary | ICD-10-CM | POA: Diagnosis not present

## 2024-02-17 DIAGNOSIS — R030 Elevated blood-pressure reading, without diagnosis of hypertension: Secondary | ICD-10-CM | POA: Diagnosis not present

## 2024-02-17 DIAGNOSIS — M129 Arthropathy, unspecified: Secondary | ICD-10-CM | POA: Diagnosis not present

## 2024-02-17 DIAGNOSIS — M545 Low back pain, unspecified: Secondary | ICD-10-CM | POA: Diagnosis not present

## 2024-02-25 DIAGNOSIS — E118 Type 2 diabetes mellitus with unspecified complications: Secondary | ICD-10-CM | POA: Diagnosis not present

## 2024-02-27 DIAGNOSIS — I5032 Chronic diastolic (congestive) heart failure: Secondary | ICD-10-CM | POA: Diagnosis not present

## 2024-02-27 DIAGNOSIS — G8929 Other chronic pain: Secondary | ICD-10-CM | POA: Diagnosis not present

## 2024-02-27 DIAGNOSIS — E118 Type 2 diabetes mellitus with unspecified complications: Secondary | ICD-10-CM | POA: Diagnosis not present

## 2024-02-27 DIAGNOSIS — I4891 Unspecified atrial fibrillation: Secondary | ICD-10-CM | POA: Diagnosis not present

## 2024-02-27 DIAGNOSIS — I10 Essential (primary) hypertension: Secondary | ICD-10-CM | POA: Diagnosis not present

## 2024-02-27 DIAGNOSIS — E782 Mixed hyperlipidemia: Secondary | ICD-10-CM | POA: Diagnosis not present

## 2024-02-27 DIAGNOSIS — M159 Polyosteoarthritis, unspecified: Secondary | ICD-10-CM | POA: Diagnosis not present

## 2024-02-28 ENCOUNTER — Telehealth: Payer: Self-pay | Admitting: Radiology

## 2024-02-28 DIAGNOSIS — M545 Low back pain, unspecified: Secondary | ICD-10-CM

## 2024-02-28 NOTE — Addendum Note (Signed)
 Addended by: GEORGINA SHARPER on: 02/28/2024 05:09 PM   Modules accepted: Orders

## 2024-02-28 NOTE — Telephone Encounter (Signed)
 Per Dr. Rolinda at [place] [facility], pt states that she was not happy with the [facility], and she would like a referral to somewhere else, possibly [facility]. Please advise

## 2024-03-01 DIAGNOSIS — R269 Unspecified abnormalities of gait and mobility: Secondary | ICD-10-CM | POA: Diagnosis not present

## 2024-03-01 DIAGNOSIS — Z7409 Other reduced mobility: Secondary | ICD-10-CM | POA: Diagnosis not present

## 2024-03-01 DIAGNOSIS — M545 Low back pain, unspecified: Secondary | ICD-10-CM | POA: Diagnosis not present

## 2024-03-03 DIAGNOSIS — Z1211 Encounter for screening for malignant neoplasm of colon: Secondary | ICD-10-CM | POA: Diagnosis not present

## 2024-04-14 NOTE — Progress Notes (Signed)
 Tracie [name]                                          MRN: [id number]   04/14/2024   The [facility] Quality Team Specialist reviewed this patient medical record for the purposes of chart review for care gap closure. The following were reviewed: chart review for care gap closure-kidney health evaluation for diabetes:eGFR  and uACR.    [facility] Quality Team
